# Patient Record
Sex: Male | Born: 1935 | Race: White | Hispanic: No | Smoking: Former smoker
Health system: Southern US, Community
[De-identification: ages and names within clinical notes are randomized; demographics above are authoritative.]

## PROBLEM LIST (undated history)

## (undated) DIAGNOSIS — I4891 Unspecified atrial fibrillation: Secondary | ICD-10-CM

## (undated) DIAGNOSIS — G7000 Myasthenia gravis without (acute) exacerbation: Secondary | ICD-10-CM

## (undated) DIAGNOSIS — I350 Nonrheumatic aortic (valve) stenosis: Secondary | ICD-10-CM

## (undated) DIAGNOSIS — G9340 Encephalopathy, unspecified: Secondary | ICD-10-CM

## (undated) DIAGNOSIS — F329 Major depressive disorder, single episode, unspecified: Secondary | ICD-10-CM

## (undated) DIAGNOSIS — I619 Nontraumatic intracerebral hemorrhage, unspecified: Secondary | ICD-10-CM

## (undated) DIAGNOSIS — R630 Anorexia: Secondary | ICD-10-CM

## (undated) DIAGNOSIS — E785 Hyperlipidemia, unspecified: Secondary | ICD-10-CM

## (undated) DIAGNOSIS — Z8673 Personal history of transient ischemic attack (TIA), and cerebral infarction without residual deficits: Secondary | ICD-10-CM

## (undated) DIAGNOSIS — M1990 Unspecified osteoarthritis, unspecified site: Secondary | ICD-10-CM

## (undated) DIAGNOSIS — N319 Neuromuscular dysfunction of bladder, unspecified: Secondary | ICD-10-CM

## (undated) DIAGNOSIS — A414 Sepsis due to anaerobes: Secondary | ICD-10-CM

## (undated) DIAGNOSIS — R7881 Bacteremia: Secondary | ICD-10-CM

## (undated) DIAGNOSIS — F0390 Unspecified dementia without behavioral disturbance: Secondary | ICD-10-CM

## (undated) DIAGNOSIS — R32 Unspecified urinary incontinence: Secondary | ICD-10-CM

## (undated) DIAGNOSIS — R296 Repeated falls: Secondary | ICD-10-CM

## (undated) DIAGNOSIS — R531 Weakness: Secondary | ICD-10-CM

## (undated) DIAGNOSIS — R270 Ataxia, unspecified: Secondary | ICD-10-CM

## (undated) DIAGNOSIS — R42 Dizziness and giddiness: Secondary | ICD-10-CM

## (undated) DIAGNOSIS — E7800 Pure hypercholesterolemia, unspecified: Secondary | ICD-10-CM

## (undated) DIAGNOSIS — I10 Essential (primary) hypertension: Secondary | ICD-10-CM

## (undated) DIAGNOSIS — N183 Chronic kidney disease, stage 3 (moderate): Principal | ICD-10-CM

## (undated) DIAGNOSIS — E559 Vitamin D deficiency, unspecified: Secondary | ICD-10-CM

## (undated) DIAGNOSIS — N189 Chronic kidney disease, unspecified: Secondary | ICD-10-CM

## (undated) DIAGNOSIS — I96 Gangrene, not elsewhere classified: Secondary | ICD-10-CM

## (undated) DIAGNOSIS — B3749 Other urogenital candidiasis: Secondary | ICD-10-CM

## (undated) HISTORY — DX: Other urogenital candidiasis: B37.49

## (undated) HISTORY — DX: Unspecified atrial fibrillation: I48.91

## (undated) HISTORY — PX: HERNIA REPAIR: SHX51

## (undated) HISTORY — DX: Major depressive disorder, single episode, unspecified: F32.9

## (undated) HISTORY — DX: Chronic kidney disease, unspecified: N18.9

## (undated) HISTORY — DX: Vitamin D deficiency, unspecified: E55.9

## (undated) HISTORY — DX: Sepsis due to anaerobes: A41.4

## (undated) HISTORY — DX: Anorexia: R63.0

## (undated) HISTORY — DX: Neuromuscular dysfunction of bladder, unspecified: N31.9

## (undated) HISTORY — PX: CERVICAL LAMINECTOMY: SHX94

## (undated) HISTORY — DX: Ataxia, unspecified: R27.0

## (undated) HISTORY — DX: Myasthenia gravis without (acute) exacerbation: G70.00

## (undated) HISTORY — DX: Nontraumatic intracerebral hemorrhage, unspecified: I61.9

## (undated) HISTORY — DX: Dizziness and giddiness: R42

## (undated) HISTORY — DX: Unspecified osteoarthritis, unspecified site: M19.90

## (undated) HISTORY — DX: Chronic kidney disease, stage 3 (moderate): N18.3

## (undated) HISTORY — DX: Gangrene, not elsewhere classified: I96

## (undated) HISTORY — DX: Encephalopathy, unspecified: G93.40

## (undated) HISTORY — DX: Repeated falls: R29.6

## (undated) HISTORY — DX: Weakness: R53.1

## (undated) HISTORY — DX: Bacteremia: R78.81

## (undated) HISTORY — DX: Pure hypercholesterolemia, unspecified: E78.00

---

## 1998-04-18 ENCOUNTER — Other Ambulatory Visit: Admission: RE | Admit: 1998-04-18 | Discharge: 1998-04-18 | Payer: Self-pay | Admitting: *Deleted

## 2002-07-18 ENCOUNTER — Encounter (INDEPENDENT_AMBULATORY_CARE_PROVIDER_SITE_OTHER): Payer: Self-pay | Admitting: Specialist

## 2002-07-18 ENCOUNTER — Ambulatory Visit (HOSPITAL_COMMUNITY): Admission: RE | Admit: 2002-07-18 | Discharge: 2002-07-18 | Payer: Self-pay | Admitting: Gastroenterology

## 2004-10-19 HISTORY — PX: BACK SURGERY: SHX140

## 2010-04-14 ENCOUNTER — Encounter: Admission: RE | Admit: 2010-04-14 | Discharge: 2010-04-14 | Payer: Self-pay | Admitting: Family Medicine

## 2010-04-30 ENCOUNTER — Inpatient Hospital Stay (HOSPITAL_COMMUNITY): Admission: RE | Admit: 2010-04-30 | Discharge: 2010-05-02 | Payer: Self-pay | Admitting: Neurological Surgery

## 2011-01-04 LAB — COMPREHENSIVE METABOLIC PANEL
AST: 29 U/L (ref 0–37)
Alkaline Phosphatase: 76 U/L (ref 39–117)
CO2: 27 mEq/L (ref 19–32)
Calcium: 9.8 mg/dL (ref 8.4–10.5)
Chloride: 104 mEq/L (ref 96–112)
Creatinine, Ser: 1.02 mg/dL (ref 0.4–1.5)
GFR calc Af Amer: >60 mL/min (ref >60)
Potassium: 4.7 mEq/L (ref 3.5–5.1)

## 2011-01-04 LAB — CBC
HCT: 44 % (ref 39.0–52.0)
MCH: 30.9 pg (ref 26.0–34.0)
MCV: 90.7 fL (ref 78.0–100.0)
Platelets: 274 10*3/uL (ref 150–400)
RDW: 13.4 % (ref 11.5–15.5)

## 2011-01-04 LAB — DIFFERENTIAL
Eosinophils Absolute: 0.3 10*3/uL (ref 0.0–0.7)
Eosinophils Relative: 5 % (ref 0–5)
Neutro Abs: 3.4 10*3/uL (ref 1.7–7.7)

## 2011-01-04 LAB — SURGICAL PCR SCREEN
MRSA, PCR: NEGATIVE
Staphylococcus aureus: POSITIVE — AB

## 2011-03-06 NOTE — Op Note (Signed)
NAMEROLEN, CONGER                         ACCOUNT NO.:  [id number]   MEDICAL RECORD NO.:  [id number]                   PATIENT TYPE:  AMB   LOCATION:  ENDO                                 FACILITY:  [facility]   PHYSICIAN:  Petra Kuba, M.D.                 DATE OF BIRTH:  02-Jun-1925   DATE OF PROCEDURE:  07/18/2002  DATE OF DISCHARGE:                                 OPERATIVE REPORT   PROCEDURE:  Colonoscopy with biopsy.   INDICATION:  Screening.  Consent was signed after risks, benefits, methods,  options thoroughly discussed in the office.   MEDICINES USED:  Demerol 50, Versed 4.   DESCRIPTION OF PROCEDURE:  Rectal inspection was pertinent for external  hemorrhoids.  Digital exam was negative.  The video colonoscope was  inserted, easily advanced around the colon to the cecum.  This did require  rolling him on his back and some abdominal pressure.  Other than some left  and right diverticula, no obvious abnormalities were seen as we advanced to  the cecum which was identified by the appendiceal orifice and the ileocecal  valve.  The scope was slowly withdrawn.  The prep was adequate.  There was  some liquid stool in some stool vaults that had to be washed and moved to  different placed into the colon and some with suction, but adequate  visualization was obtained.  On slow withdrawal through the colon, other  than the right and left-sided diverticula, two small linear transverse  ulcers were seen and were both cold biopsied.  No polypoid lesions, masses,  or other abnormalities but the diverticula was seen, as we slowly withdrew  back to the rectum.  Once back in the rectum, the scope was retroflexed,  pertinent for some internal hemorrhoids.  The scope was straightened and  readvanced a short ways up the left side of the colon; air was suctioned and  scope removed.  The patient tolerated the procedure well.  There was no  obvious immediate complication.   ENDOSCOPIC  DIAGNOSES:  1. Small internal-external hemorrhoids.  2. Left and right diverticula, moderate.  3. Two transverse small linear ulcers, status post biopsied.  4. Otherwise within normal limits to the cecum.    PLAN:  Await pathology, although these are probably aspirin and nonsteroidal  induced.  Be happy to see back sooner p.r.n.  Otherwise return care to Dr.  Idell Pickles for the customary health care maintenance to include yearly rectals  and guaiacs.  Consideration for repeat screening in 5-10 years if doing well  medically, and we will leave that to Dr. Idell Pickles.                                               Petra Kuba, M.D.  MEM/MEDQ  D:  07/18/2002  T:  07/18/2002  Job:  [id number]   cc:   Raynelle Dick, M.D.  [address]  [place]  [place] [address]  Fax: [phone]

## 2012-02-01 DIAGNOSIS — R609 Edema, unspecified: Secondary | ICD-10-CM | POA: Diagnosis not present

## 2012-02-08 DIAGNOSIS — M7989 Other specified soft tissue disorders: Secondary | ICD-10-CM | POA: Diagnosis not present

## 2012-02-09 ENCOUNTER — Other Ambulatory Visit: Payer: Self-pay | Admitting: Family Medicine

## 2012-02-09 DIAGNOSIS — M7989 Other specified soft tissue disorders: Secondary | ICD-10-CM

## 2012-02-10 ENCOUNTER — Ambulatory Visit
Admission: RE | Admit: 2012-02-10 | Discharge: 2012-02-10 | Disposition: A | Discharge status: Home / Self Care | Payer: Medicare Other | Source: Ambulatory Visit | Attending: Family Medicine | Admitting: Family Medicine

## 2012-02-10 DIAGNOSIS — M7989 Other specified soft tissue disorders: Secondary | ICD-10-CM

## 2012-02-10 DIAGNOSIS — M79609 Pain in unspecified limb: Secondary | ICD-10-CM | POA: Diagnosis not present

## 2012-02-11 DIAGNOSIS — M25569 Pain in unspecified knee: Secondary | ICD-10-CM | POA: Diagnosis not present

## 2012-05-26 DIAGNOSIS — E1139 Type 2 diabetes mellitus with other diabetic ophthalmic complication: Secondary | ICD-10-CM | POA: Diagnosis not present

## 2012-07-27 DIAGNOSIS — Z23 Encounter for immunization: Secondary | ICD-10-CM | POA: Diagnosis not present

## 2012-08-10 DIAGNOSIS — E7800 Pure hypercholesterolemia, unspecified: Secondary | ICD-10-CM | POA: Diagnosis not present

## 2012-08-10 DIAGNOSIS — I10 Essential (primary) hypertension: Secondary | ICD-10-CM | POA: Diagnosis not present

## 2012-08-17 DIAGNOSIS — E7800 Pure hypercholesterolemia, unspecified: Secondary | ICD-10-CM | POA: Diagnosis not present

## 2012-08-17 DIAGNOSIS — I10 Essential (primary) hypertension: Secondary | ICD-10-CM | POA: Diagnosis not present

## 2013-07-17 DIAGNOSIS — Z23 Encounter for immunization: Secondary | ICD-10-CM | POA: Diagnosis not present

## 2013-11-16 DIAGNOSIS — I10 Essential (primary) hypertension: Secondary | ICD-10-CM | POA: Diagnosis not present

## 2013-11-16 DIAGNOSIS — E7800 Pure hypercholesterolemia, unspecified: Secondary | ICD-10-CM | POA: Diagnosis not present

## 2013-11-16 DIAGNOSIS — Z23 Encounter for immunization: Secondary | ICD-10-CM | POA: Diagnosis not present

## 2014-07-26 DIAGNOSIS — Z23 Encounter for immunization: Secondary | ICD-10-CM | POA: Diagnosis not present

## 2014-11-15 DIAGNOSIS — I10 Essential (primary) hypertension: Secondary | ICD-10-CM | POA: Diagnosis not present

## 2014-11-15 DIAGNOSIS — E780 Pure hypercholesterolemia: Secondary | ICD-10-CM | POA: Diagnosis not present

## 2014-11-15 DIAGNOSIS — Z79899 Other long term (current) drug therapy: Secondary | ICD-10-CM | POA: Diagnosis not present

## 2014-11-16 DIAGNOSIS — H2513 Age-related nuclear cataract, bilateral: Secondary | ICD-10-CM | POA: Diagnosis not present

## 2015-01-16 ENCOUNTER — Inpatient Hospital Stay (HOSPITAL_COMMUNITY)
Admission: EM | Admit: 2015-01-16 | Discharge: 2015-01-21 | DRG: 871 | Disposition: A | Discharge status: Home Health | Payer: Medicare Other | Attending: Internal Medicine | Admitting: Internal Medicine

## 2015-01-16 ENCOUNTER — Emergency Department (HOSPITAL_COMMUNITY): Payer: Medicare Other

## 2015-01-16 ENCOUNTER — Encounter (HOSPITAL_COMMUNITY): Payer: Self-pay | Admitting: Emergency Medicine

## 2015-01-16 DIAGNOSIS — Y92019 Unspecified place in single-family (private) house as the place of occurrence of the external cause: Secondary | ICD-10-CM

## 2015-01-16 DIAGNOSIS — A4151 Sepsis due to Escherichia coli [E. coli]: Principal | ICD-10-CM | POA: Diagnosis present

## 2015-01-16 DIAGNOSIS — I509 Heart failure, unspecified: Secondary | ICD-10-CM | POA: Diagnosis not present

## 2015-01-16 DIAGNOSIS — J9811 Atelectasis: Secondary | ICD-10-CM | POA: Diagnosis present

## 2015-01-16 DIAGNOSIS — Z66 Do not resuscitate: Secondary | ICD-10-CM | POA: Diagnosis present

## 2015-01-16 DIAGNOSIS — Z87891 Personal history of nicotine dependence: Secondary | ICD-10-CM | POA: Diagnosis not present

## 2015-01-16 DIAGNOSIS — I10 Essential (primary) hypertension: Secondary | ICD-10-CM | POA: Diagnosis not present

## 2015-01-16 DIAGNOSIS — I350 Nonrheumatic aortic (valve) stenosis: Secondary | ICD-10-CM | POA: Diagnosis present

## 2015-01-16 DIAGNOSIS — E876 Hypokalemia: Secondary | ICD-10-CM | POA: Diagnosis present

## 2015-01-16 DIAGNOSIS — Z7982 Long term (current) use of aspirin: Secondary | ICD-10-CM | POA: Diagnosis not present

## 2015-01-16 DIAGNOSIS — R509 Fever, unspecified: Secondary | ICD-10-CM | POA: Diagnosis not present

## 2015-01-16 DIAGNOSIS — Z79899 Other long term (current) drug therapy: Secondary | ICD-10-CM

## 2015-01-16 DIAGNOSIS — D649 Anemia, unspecified: Secondary | ICD-10-CM | POA: Diagnosis present

## 2015-01-16 DIAGNOSIS — I517 Cardiomegaly: Secondary | ICD-10-CM | POA: Diagnosis present

## 2015-01-16 DIAGNOSIS — R579 Shock, unspecified: Secondary | ICD-10-CM | POA: Diagnosis present

## 2015-01-16 DIAGNOSIS — A419 Sepsis, unspecified organism: Secondary | ICD-10-CM | POA: Diagnosis present

## 2015-01-16 DIAGNOSIS — R739 Hyperglycemia, unspecified: Secondary | ICD-10-CM | POA: Diagnosis present

## 2015-01-16 DIAGNOSIS — E861 Hypovolemia: Secondary | ICD-10-CM | POA: Diagnosis present

## 2015-01-16 DIAGNOSIS — I4891 Unspecified atrial fibrillation: Secondary | ICD-10-CM | POA: Diagnosis not present

## 2015-01-16 DIAGNOSIS — M1990 Unspecified osteoarthritis, unspecified site: Secondary | ICD-10-CM | POA: Diagnosis present

## 2015-01-16 DIAGNOSIS — R531 Weakness: Secondary | ICD-10-CM | POA: Diagnosis not present

## 2015-01-16 DIAGNOSIS — R404 Transient alteration of awareness: Secondary | ICD-10-CM | POA: Diagnosis not present

## 2015-01-16 DIAGNOSIS — E785 Hyperlipidemia, unspecified: Secondary | ICD-10-CM | POA: Diagnosis present

## 2015-01-16 DIAGNOSIS — N179 Acute kidney failure, unspecified: Secondary | ICD-10-CM | POA: Insufficient documentation

## 2015-01-16 DIAGNOSIS — R6521 Severe sepsis with septic shock: Secondary | ICD-10-CM | POA: Diagnosis present

## 2015-01-16 HISTORY — DX: Nonrheumatic aortic (valve) stenosis: I35.0

## 2015-01-16 HISTORY — DX: Hyperlipidemia, unspecified: E78.5

## 2015-01-16 HISTORY — DX: Essential (primary) hypertension: I10

## 2015-01-16 LAB — CBC WITH DIFFERENTIAL/PLATELET
Basophils Absolute: 0 10*3/uL (ref 0.0–0.1)
Basophils Relative: 0 % (ref 0–1)
EOS PCT: 0 % (ref 0–5)
Eosinophils Absolute: 0 10*3/uL (ref 0.0–0.7)
HCT: 42.6 % (ref 39.0–52.0)
Hemoglobin: 14.4 g/dL (ref 13.0–17.0)
Lymphocytes Relative: 2 % — ABNORMAL LOW (ref 12–46)
Lymphs Abs: 0.4 10*3/uL — ABNORMAL LOW (ref 0.7–4.0)
MCH: 30.8 pg (ref 26.0–34.0)
MCHC: 33.8 g/dL (ref 30.0–36.0)
MCV: 91 fL (ref 78.0–100.0)
Monocytes Absolute: 1.6 10*3/uL — ABNORMAL HIGH (ref 0.1–1.0)
Monocytes Relative: 7 % (ref 3–12)
NEUTROS ABS: 20.4 10*3/uL — AB (ref 1.7–7.7)
Neutrophils Relative %: 91 % — ABNORMAL HIGH (ref 43–77)
Platelets: 213 10*3/uL (ref 150–400)
RBC: 4.68 MIL/uL (ref 4.22–5.81)
RDW: 13.7 % (ref 11.5–15.5)
WBC: 22.4 10*3/uL — AB (ref 4.0–10.5)

## 2015-01-16 LAB — CBG MONITORING, ED: GLUCOSE-CAPILLARY: 175 mg/dL — AB (ref 70–99)

## 2015-01-16 LAB — COMPREHENSIVE METABOLIC PANEL
ALBUMIN: 3.3 g/dL — AB (ref 3.5–5.2)
ALK PHOS: 102 U/L (ref 39–117)
ALT: 30 U/L (ref 0–53)
ANION GAP: 18 — AB (ref 5–15)
AST: 45 U/L — ABNORMAL HIGH (ref 0–37)
BUN: 46 mg/dL — AB (ref 6–23)
CO2: 17 mmol/L — ABNORMAL LOW (ref 19–32)
CREATININE: 2.39 mg/dL — AB (ref 0.50–1.35)
Calcium: 8.7 mg/dL (ref 8.4–10.5)
Chloride: 100 mmol/L (ref 96–112)
GFR calc Af Amer: 26 mL/min — ABNORMAL LOW (ref >90)
GFR, EST NON AFRICAN AMERICAN: 23 mL/min — AB (ref >90)
Glucose, Bld: 167 mg/dL — ABNORMAL HIGH (ref 70–99)
Potassium: 3.6 mmol/L (ref 3.5–5.1)
Sodium: 135 mmol/L (ref 135–145)
TOTAL PROTEIN: 6.7 g/dL (ref 6.0–8.3)
Total Bilirubin: 1.4 mg/dL — ABNORMAL HIGH (ref 0.3–1.2)

## 2015-01-16 LAB — I-STAT CG4 LACTIC ACID, ED
LACTIC ACID, VENOUS: 4.66 mmol/L — AB (ref 0.5–2.0)
Lactic Acid, Venous: 6.53 mmol/L (ref 0.5–2.0)

## 2015-01-16 LAB — URINALYSIS, ROUTINE W REFLEX MICROSCOPIC
Bilirubin Urine: NEGATIVE
Glucose, UA: NEGATIVE mg/dL
Hgb urine dipstick: NEGATIVE
KETONES UR: NEGATIVE mg/dL
Leukocytes, UA: NEGATIVE
NITRITE: NEGATIVE
PROTEIN: 30 mg/dL — AB
SPECIFIC GRAVITY, URINE: 1.023 (ref 1.005–1.030)
UROBILINOGEN UA: 1 mg/dL (ref 0.0–1.0)
pH: 5 (ref 5.0–8.0)

## 2015-01-16 LAB — URINE MICROSCOPIC-ADD ON

## 2015-01-16 MED ORDER — ACETAMINOPHEN 325 MG PO TABS
650.0000 mg | ORAL_TABLET | Freq: Once | ORAL | Status: AC
  Administered 2015-01-16: 650 mg via ORAL
  Filled 2015-01-16: qty 2

## 2015-01-16 MED ORDER — VANCOMYCIN HCL IN DEXTROSE 1-5 GM/200ML-% IV SOLN
1000.0000 mg | Freq: Once | INTRAVENOUS | Status: AC
  Administered 2015-01-16: 1000 mg via INTRAVENOUS
  Filled 2015-01-16: qty 200

## 2015-01-16 MED ORDER — LIDOCAINE HCL (PF) 1 % IJ SOLN
5.0000 mL | Freq: Once | INTRAMUSCULAR | Status: DC
  Filled 2015-01-16: qty 5

## 2015-01-16 MED ORDER — PIPERACILLIN-TAZOBACTAM 3.375 G IVPB
3.3750 g | Freq: Three times a day (TID) | INTRAVENOUS | Status: DC
  Administered 2015-01-17 – 2015-01-19 (×7): 3.375 g via INTRAVENOUS
  Filled 2015-01-16 (×8): qty 50

## 2015-01-16 MED ORDER — PIPERACILLIN-TAZOBACTAM 3.375 G IVPB 30 MIN
3.3750 g | Freq: Once | INTRAVENOUS | Status: AC
  Administered 2015-01-16: 3.375 g via INTRAVENOUS
  Filled 2015-01-16: qty 50

## 2015-01-16 MED ORDER — NOREPINEPHRINE BITARTRATE 1 MG/ML IV SOLN
0.0000 ug/min | INTRAVENOUS | Status: DC
  Administered 2015-01-16: 5 ug/min via INTRAVENOUS
  Administered 2015-01-17: 8 ug/min via INTRAVENOUS
  Administered 2015-01-17: 10 ug/min via INTRAVENOUS
  Administered 2015-01-18: 3 ug/min via INTRAVENOUS
  Filled 2015-01-16 (×4): qty 4

## 2015-01-16 MED ORDER — SODIUM CHLORIDE 0.9 % IV BOLUS (SEPSIS)
1000.0000 mL | INTRAVENOUS | Status: AC
  Administered 2015-01-16 (×3): 1000 mL via INTRAVENOUS

## 2015-01-16 MED ORDER — VANCOMYCIN HCL IN DEXTROSE 1-5 GM/200ML-% IV SOLN
1000.0000 mg | INTRAVENOUS | Status: DC
  Administered 2015-01-17: 1000 mg via INTRAVENOUS
  Filled 2015-01-16: qty 200

## 2015-01-16 MED ORDER — LIDOCAINE HCL 2 % EX GEL
CUTANEOUS | Status: AC
  Administered 2015-01-16: 23:00:00
  Filled 2015-01-16: qty 10

## 2015-01-16 MED ORDER — ACETAMINOPHEN 500 MG PO TABS: 1000.0000 mg | ORAL_TABLET | Freq: Once | ORAL | Status: DC

## 2015-01-16 NOTE — ED Notes (Signed)
Bed: WA08 Expected date:  Expected time:  Means of arrival:  Comments: EMS 81 yo male with increased weakness over the last several weeks

## 2015-01-16 NOTE — Progress Notes (Addendum)
  CARE MANAGEMENT ED NOTE 01/16/2015  Patient:  Seth [name],Seth [name]   Account Number:  [id number]  Date Initiated:  01/16/2015  Documentation initiated by:  Radford PaxFERRERO,Urban Naval  Subjective/Objective Assessment:   Patient presents to Ed with generalized weakness     Subjective/Objective Assessment Detail:   81 year old male with past medical history of hypertension, hyperlipidemia, and osteoarthritis.  B/P aslow as 78/56, temp101.3 rectal, WBC 22.4, lactic acid 6.53     Action/Plan:   Action/Plan Detail:   Anticipated DC Date:       Status Recommendation to Physician:   Result of Recommendation:    Other ED Services  Consult Working Plan    DC Planning Services  Other  PCP issues    Choice offered to / List presented to:            Status of service:  Completed, signed off  ED Comments:   ED Comments Detail:  EDCM spoke to patient and his grand children at bedside. Patient reports he lives at home alone.  Patient does not have any home health services at this time and has never had home health services.  Patient reports the only dme he has at home is a "couple of canes."  Patient reports "up until this point"  he was able to perform his own ADL's. Patient confirms his pcp is Dr. Duane LopeAlan Ross.  System updated. EDCM provided patient with a [place] health [place], explained services.  Patient reports he does not have the need for home health services or dme currently.  No further EDCM needs at this time.

## 2015-01-16 NOTE — ED Notes (Signed)
Notified EDP, Plunkett,MD., pt. i-stat Lactic acid results 6.53 and  RN, Isaias CowmanAllan.

## 2015-01-16 NOTE — ED Provider Notes (Signed)
CSN: [id number]     Arrival date & time 01/16/15  1937 History   First MD Initiated Contact with Patient 01/16/15 2008     Chief Complaint  Patient presents with  . Weakness  . Fever     (Consider location/radiation/quality/duration/timing/severity/associated sxs/prior Treatment) HPI  This is an 81 year old male with past medical history of hypertension, hyperlipidemia, and osteoarthritis. He is brought in by EMS for generalized weakness. History is given predominantly by his sons who stated that he seems to be "declining" over the past week. He has a decreased appetite, decreased fluid intake. He states that Sunday, was complaining of some abdominal pain. His son went to his house today and found him lying on the floor. He is not sure how long he was on the floor. The patient states that his knees gave out and he fell and was unable to get back up. Upon arrival, the patient was found to be hypotensive, tachycardic and febrile with a rectal temperature of 101.3. Sepsis orders were immediately initiated. The patient denies any cough, urinary symptoms, current abdominal pain, nausea, vomiting or diarrhea.  Past Medical History  Diagnosis Date  . Hypertension   . Hyperlipidemia    History reviewed. No pertinent past surgical history. History reviewed. No pertinent family history. History  Substance Use Topics  . Smoking status: Former Games developermoker  . Smokeless tobacco: Never Used  . Alcohol Use: Yes    Review of Systems  Ten systems reviewed and are negative for acute change, except as noted in the HPI.    Allergies  Review of patient's allergies indicates no known allergies.  Home Medications   Prior to Admission medications   Not on File   BP 81/43 mmHg  Pulse 109  Temp(Src) 101.3 F (38.5 C) (Rectal)  Resp 33  Ht 6' (1.829 m)  Wt 193 lb (87.544 kg)  BMI 26.17 kg/m2  SpO2 95% Physical Exam  Constitutional: He is oriented to person, place, and time. He appears  well-developed and well-nourished. No distress.  HENT:  Head: Normocephalic and atraumatic.  Eyes: Conjunctivae are normal. Left eye exhibits discharge. No scleral icterus.  Neck: Normal range of motion. Neck supple.  Cardiovascular: Normal rate, regular rhythm, normal heart sounds and intact distal pulses.   Pulmonary/Chest: Effort normal and breath sounds normal. No respiratory distress.  Abdominal: Soft. He exhibits no distension and no mass. There is no tenderness. There is no guarding.  Musculoskeletal: He exhibits no edema.  Neurological: He is alert and oriented to person, place, and time.  Skin: Skin is warm and dry. He is not diaphoretic.  Psychiatric: His behavior is normal.  Nursing note and vitals reviewed.   ED Course  Procedures (including critical care time) Labs Review Labs Reviewed  CBG MONITORING, ED - Abnormal; Notable for the following:    Glucose-Capillary 175 (*)    All other components within normal limits  I-STAT CG4 LACTIC ACID, ED - Abnormal; Notable for the following:    Lactic Acid, Venous 6.53 (*)    All other components within normal limits    Imaging Review No results found.   EKG Interpretation None     CRITICAL CARE Performed by: Arthor CaptainHarris, Kaivon Livesey   Total critical care time: 60   Critical care time was exclusive of separately billable procedures and treating other patients.  Critical care was necessary to treat or prevent imminent or life-threatening deterioration.  Critical care was time spent personally by me on the following activities: development of treatment  plan with patient and/or surrogate as well as nursing, discussions with consultants, evaluation of patient's response to treatment, examination of patient, obtaining history from patient or surrogate, ordering and performing treatments and interventions, ordering and review of laboratory studies, ordering and review of radiographic studies, pulse oximetry and re-evaluation of  patient's condition.   MDM   Final diagnoses:  Septic shock  Acute renal failure, unspecified acute renal failure type    8:38 PM Patient here, febrile, tachycardic, hypotensive. He did take his antihypertensive medications morning. Patient is receiving weight-based dosing. He is receiving vancomycin and Zosyn for unknown septic source. Lactate at 6.53. Initially, CBG of 175. Other labs are pending. Patient's respirations at 33.   9:43 PM BP 78/56 mmHg  Pulse 97  Temp(Src) 101.3 F (38.5 C) (Rectal)  Resp 18  Ht 6' (1.829 m)  Wt 193 lb (87.544 kg)  BMI 26.17 kg/m2  SpO2 96% Patient continues to be hypotensive despite 2-1/2 L bolus. Level I. Sepsis initiated. I spoke with the patient about more aggressive measures. He is 78, has a very high quality of life. He wishes for central line placement and pressors to be started.   10:38 PM Central line placed. Patient receiving pressors. I have informed Critical care.   11:47 PM Urine without apparent infection. Leukocytosis of 22 000.  Lactate improving. No signs of cellulitis. No signs of meningitis. Septic source is unknown. Ck iis pending Patient will be moved to ICU.  Levophed titrated to pressure up to 90.  Arthor Captain, PA-C 01/16/15 2350  Gwyneth Sprout, MD 01/17/15 1510  Gwyneth Sprout, MD 01/17/15 [phone]

## 2015-01-16 NOTE — ED Notes (Signed)
Notified RN,Kellee pt. i-stat Lactic acid CG4 results 4.66.

## 2015-01-16 NOTE — ED Notes (Signed)
Blood Culture x 2 has been collected and sent to lab at 2010.

## 2015-01-16 NOTE — Progress Notes (Signed)
ANTIBIOTIC CONSULT NOTE - INITIAL  Pharmacy Consult for Vancomycin, Zosyn Indication: rule out sepsis  No Known Allergies  Patient Measurements: Height: 6' (182.9 cm) Weight: 193 lb (87.544 kg) IBW/kg (Calculated) : 77.6  Vital Signs: Temp: 101.3 F (38.5 C) (03/30 2000) Temp Source: Rectal (03/30 2000) BP: 81/43 mmHg (03/30 1947) Pulse Rate: 109 (03/30 1947) Intake/Output from previous day:   Intake/Output from this shift:    Labs:  Recent Labs  01/16/15 1958  WBC 22.4*  HGB 14.4  PLT 213  CREATININE 2.39*   Estimated Creatinine Clearance: 23.9 mL/min (by C-G formula based on Cr of 2.39). No results for input(s): VANCOTROUGH, VANCOPEAK, VANCORANDOM, GENTTROUGH, GENTPEAK, GENTRANDOM, TOBRATROUGH, TOBRAPEAK, TOBRARND, AMIKACINPEAK, AMIKACINTROU, AMIKACIN in the last 72 hours.   Microbiology: No results found for this or any previous visit (from the past 720 hour(s)).  Medical History: Past Medical History  Diagnosis Date  . Hypertension   . Hyperlipidemia     Medications:  Anti-infectives    Start     Dose/Rate Route Frequency Ordered Stop   01/16/15 2030  piperacillin-tazobactam (ZOSYN) IVPB 3.375 g     3.375 g 100 mL/hr over 30 Minutes Intravenous  Once 01/16/15 2018     01/16/15 2030  vancomycin (VANCOCIN) IVPB 1000 mg/200 mL premix     1,000 mg 200 mL/hr over 60 Minutes Intravenous  Once 01/16/15 2018       Assessment: Seth [name] presented to ED on 3/30 after son found patient lying on the floor.  He reports generalized weakness (for years) and fall w/o injury today.  Pharmacy is consulted to dose vancomycin and Zosyn for suspected sepsis.   3/30 >> Vanc >> 3/30 >> Zosyn >>    Today, 01/16/2015:  Tmax: 101.3  WBCs: 22.4  Renal: SCr 2.39, CrCl ~ 24 ml/mn  Lactic acid: 6.53  Goal of Therapy:  Vancomycin trough level 15-20 mcg/ml  Plan:   Zosyn 3.375g IV Q8H infused over 4hrs.   Vancomycin 1g IV q24h.  Measure Vanc trough at steady  state.  Follow up renal fxn, culture results, and clinical course.   Lynann Beaverhristine Bentlie Catanzaro PharmD, BCPS Pager [phone] 01/16/2015 8:23 PM

## 2015-01-16 NOTE — ED Notes (Signed)
Brought in by EMS from home with c/o generalized weakness.  Pt reported that he has been having "weakness for years" but he has had a fall tonight without injury--- pt refused to come to ED but family insisted for evaluation.  Pt arrived to ED A/Ox4, in no s/s apparent distress.

## 2015-01-16 NOTE — H&P (Signed)
PULMONARY / CRITICAL CARE MEDICINE   Name: Seth [name] MRN: [id number] DOB: 1925/03/30    ADMISSION DATE:  01/16/2015 CONSULTATION DATE:  01/16/2015  REFERRING MD :  EDP Plunkett  CHIEF COMPLAINT:  Weakness/fall  INITIAL PRESENTATION:  81 year old male presented [facility] ED 3/30 with weakness x1 week. Had fall at home without injury. In ED he was found to be hypotensive with elevated lactic. Also febrile. CVL and pressors in ED. PCCM to admit.  STUDIES:    SIGNIFICANT EVENTS: 3/30 fall, found down by son, to ICU on pressors.   HISTORY OF PRESENT ILLNESS:  81 year old male with PMH as below, which is significant for hypertension. Recently complained of abdominal pain on 3/26 after big dinner, but no other complaints. Presented 3/30 when he was found down at home by son, however, he was conscious. Remembers falling, no dizziness, has bad knees and the "gave out". No LOC.   He was brought to [facility] ED where he was found to be hypotensive with elevated lactic. He was given volume and antibiotics. Lactic somewhat improved, but did not clear. BP still soft. CVL placed in ED and pressors initiated. PCCM to admit.  PAST MEDICAL HISTORY :   has a past medical history of Hypertension and Hyperlipidemia.  has no past surgical history on file. Prior to Admission medications   Medication Sig Start Date End Date Taking? Authorizing Provider  aspirin 81 MG tablet Take 81 mg by mouth at bedtime.   Yes Historical Provider, MD  CINNAMON PO Take 1 capsule by mouth daily.   Yes Historical Provider, MD  lisinopril (PRINIVIL,ZESTRIL) 20 MG tablet Take 20 mg by mouth daily.   Yes Historical Provider, MD  metoprolol succinate (TOPROL-XL) 50 MG 24 hr tablet Take 50 mg by mouth daily. Take with or immediately following a meal.   Yes Historical Provider, MD  Omega-3 Fatty Acids (FISH OIL PO) Take 1 capsule by mouth daily.   Yes Historical Provider, MD  simvastatin (ZOCOR) 10 MG tablet Take 10 mg by mouth at  bedtime.   Yes Historical Provider, MD   No Known Allergies  FAMILY HISTORY:  has no family status information on file.  SOCIAL HISTORY:  reports that he has quit smoking. He has never used smokeless tobacco. He reports that he drinks alcohol. He reports that he does not use illicit drugs.  REVIEW OF SYSTEMS:  Bolds are positive  Constitutional: weight loss, gain, night sweats, Fevers, chills, fatigue .  HEENT: headaches, Sore throat, sneezing, nasal congestion, post nasal drip, Difficulty swallowing, Tooth/dental problems, visual complaints visual changes, ear ache CV:  chest pain, radiates: ,Orthopnea, PND, swelling in lower extremities, dizziness, palpitations, syncope/orthostasis  GI  heartburn, indigestion, abdominal pain, nausea, vomiting, diarrhea, change in bowel habits, loss of appetite, bloody stools.  Resp: cough, productive: , hemoptysis, dyspnea, chest pain, pleuritic.  Skin: rash or itching or icterus GU: dysuria, change in color of urine, urgency or frequency. flank pain, hematuria  MS: joint pain or swelling. decreased range of motion  Psych: change in mood or affect. depression or anxiety.  Neuro: difficulty with speech, weakness, numbness, ataxia    SUBJECTIVE:   VITAL SIGNS: Temp:  [101.3 F (38.5 C)] 101.3 F (38.5 C) (03/30 2000) Pulse Rate:  [88-109] 93 (03/30 2323) Resp:  [12-33] 28 (03/30 2323) BP: (71-120)/(41-97) 88/59 mmHg (03/30 2323) SpO2:  [88 %-96 %] 94 % (03/30 2323) Weight:  [87.544 kg (193 lb)] 87.544 kg (193 lb) (03/30 1955)  HEMODYNAMICS:   VENTILATOR SETTINGS:   INTAKE / OUTPUT:  Intake/Output Summary (Last 24 hours) at 01/16/15 2331 Last data filed at 01/16/15 2305  Gross per 24 hour  Intake   3000 ml  Output     35 ml  Net   2965 ml    PHYSICAL EXAMINATION: General:  Obese male in NAD Neuro:  Alert, oriented x 4, non-focal HEENT:  [place]/AT, no JVD noted, PERRL Cardiovascular:  Irreg Irreg, normal rate Lungs:  Clear bilateral  breath sounds Abdomen:  Soft, non-tender, non-distended Musculoskeletal:  No acute deformity or edema Skin:  Grossly intact  LABS:  CBC  Recent Labs Lab 01/16/15 1958  WBC 22.4*  HGB 14.4  HCT 42.6  PLT 213   Coag's No results for input(s): APTT, INR in the last 168 hours. BMET  Recent Labs Lab 01/16/15 1958  NA 135  K 3.6  CL 100  CO2 17*  BUN 46*  CREATININE 2.39*  GLUCOSE 167*   Electrolytes  Recent Labs Lab 01/16/15 1958  CALCIUM 8.7   Sepsis Markers  Recent Labs Lab 01/16/15 2014 01/16/15 2148  LATICACIDVEN 6.53* 4.66*   ABG No results for input(s): PHART, PCO2ART, PO2ART in the last 168 hours. Liver Enzymes  Recent Labs Lab 01/16/15 1958  AST 45*  ALT 30  ALKPHOS 102  BILITOT 1.4*  ALBUMIN 3.3*   Cardiac Enzymes No results for input(s): TROPONINI, PROBNP in the last 168 hours. Glucose  Recent Labs Lab 01/16/15 1956  GLUCAP 175*    Imaging No results found.   ASSESSMENT / PLAN:  PULMONARY A: No acute issues  P:   O2 PRN to keep sats > 92% DNR/DNI  CARDIOVASCULAR CVL RIJ 3/30 >>> A:  Shock, etiology uncertain, suspect sepsis vs hypovolemic Atrial Fib (new onset) > CHA2DS-VASc = 3 H/o HTN  P:  MAP goal > 69m/Hg CVP monitoring, goal > 10 Levophed for MAP goal Ensure lactic clearing Ensure 30 cc/kg Check troponin Heparin gtt Consult cardiology in AM Holding outpatient antihypertensives  RENAL A:   AKI > suspect pre-renal  P:   Hydrate Follow bmet Correct lytes as indicated  GASTROINTESTINAL A:   ? Intraabdominal infection Poor PO intake  P:   NPO SUP: IV Protonix Consider CT abd/pelvis Amylase Lipase Alk phos  HEMATOLOGIC A:   No acute issues  P:  Heparin gtt for AF Follow CBC  INFECTIOUS A:   SIRS Septic shock, etiology unclear, consider intraabdominal?  P:   BCx2 3/30 > UC 3/30 > Flu 3/30 > Abx: pip/tazo, start date 3/30> Abx: vancomycin, start date 3/30> Follow WBC and  fever curve  ENDOCRINE A:   Hyperglycemia without history DM  P:   Follow glucose on Chem. Add SSI if consistently greater than 180  NEUROLOGIC A:   No acute issues  P:   RASS goal: 0 [place]  FAMILY  - Updates: updated, patient and family in ED 3/31  - Inter-disciplinary family meet or Palliative Care meeting due by:  4/6   PGeorgann Housekeeper AGACNP-BC LPutnam Gi LLCPulmonology/Critical Care Pager [phone] [phone] 01/16/2015 11:45 PM

## 2015-01-17 DIAGNOSIS — N179 Acute kidney failure, unspecified: Secondary | ICD-10-CM

## 2015-01-17 DIAGNOSIS — R6521 Severe sepsis with septic shock: Secondary | ICD-10-CM

## 2015-01-17 DIAGNOSIS — I509 Heart failure, unspecified: Secondary | ICD-10-CM

## 2015-01-17 DIAGNOSIS — R739 Hyperglycemia, unspecified: Secondary | ICD-10-CM | POA: Insufficient documentation

## 2015-01-17 DIAGNOSIS — A419 Sepsis, unspecified organism: Secondary | ICD-10-CM

## 2015-01-17 DIAGNOSIS — R579 Shock, unspecified: Secondary | ICD-10-CM | POA: Diagnosis present

## 2015-01-17 DIAGNOSIS — I4891 Unspecified atrial fibrillation: Secondary | ICD-10-CM | POA: Insufficient documentation

## 2015-01-17 LAB — ALKALINE PHOSPHATASE: ALK PHOS: 80 U/L (ref 39–117)

## 2015-01-17 LAB — BASIC METABOLIC PANEL
ANION GAP: 9 (ref 5–15)
Anion gap: 6 (ref 5–15)
BUN: 47 mg/dL — ABNORMAL HIGH (ref 6–23)
BUN: 42 mg/dL — AB (ref 6–23)
CALCIUM: 7.6 mg/dL — AB (ref 8.4–10.5)
CO2: 21 mmol/L (ref 19–32)
CO2: 23 mmol/L (ref 19–32)
CREATININE: 2.05 mg/dL — AB (ref 0.50–1.35)
CREATININE: 1.59 mg/dL — AB (ref 0.50–1.35)
Calcium: 8 mg/dL — ABNORMAL LOW (ref 8.4–10.5)
Chloride: 105 mmol/L (ref 96–112)
Chloride: 108 mmol/L (ref 96–112)
GFR calc Af Amer: 43 mL/min — ABNORMAL LOW (ref >90)
GFR calc non Af Amer: 27 mL/min — ABNORMAL LOW (ref >90)
GFR, EST AFRICAN AMERICAN: 32 mL/min — AB (ref >90)
GFR, EST NON AFRICAN AMERICAN: 37 mL/min — AB (ref >90)
GLUCOSE: 111 mg/dL — AB (ref 70–99)
Glucose, Bld: 189 mg/dL — ABNORMAL HIGH (ref 70–99)
POTASSIUM: 3.9 mmol/L (ref 3.5–5.1)
Potassium: 3.9 mmol/L (ref 3.5–5.1)
SODIUM: 135 mmol/L (ref 135–145)
Sodium: 137 mmol/L (ref 135–145)

## 2015-01-17 LAB — CARBOXYHEMOGLOBIN
CARBOXYHEMOGLOBIN: 1.4 % (ref 0.5–1.5)
METHEMOGLOBIN: 0.8 % (ref 0.0–1.5)
O2 SAT: 70.7 %
TOTAL HEMOGLOBIN: 12.4 g/dL — AB (ref 13.5–18.0)

## 2015-01-17 LAB — CBC WITH DIFFERENTIAL/PLATELET
Basophils Absolute: 0 10*3/uL (ref 0.0–0.1)
Basophils Relative: 0 % (ref 0–1)
EOS PCT: 1 % (ref 0–5)
Eosinophils Absolute: 0.1 10*3/uL (ref 0.0–0.7)
HEMATOCRIT: 35.9 % — AB (ref 39.0–52.0)
Hemoglobin: 12.1 g/dL — ABNORMAL LOW (ref 13.0–17.0)
LYMPHS ABS: 1.2 10*3/uL (ref 0.7–4.0)
Lymphocytes Relative: 6 % — ABNORMAL LOW (ref 12–46)
MCH: 30.3 pg (ref 26.0–34.0)
MCHC: 33.7 g/dL (ref 30.0–36.0)
MCV: 89.8 fL (ref 78.0–100.0)
MONOS PCT: 11 % (ref 3–12)
Monocytes Absolute: 2 10*3/uL — ABNORMAL HIGH (ref 0.1–1.0)
Neutro Abs: 15.9 10*3/uL — ABNORMAL HIGH (ref 1.7–7.7)
Neutrophils Relative %: 82 % — ABNORMAL HIGH (ref 43–77)
Platelets: 201 10*3/uL (ref 150–400)
RBC: 4 MIL/uL — ABNORMAL LOW (ref 4.22–5.81)
RDW: 13.9 % (ref 11.5–15.5)
WBC: 19.3 10*3/uL — ABNORMAL HIGH (ref 4.0–10.5)

## 2015-01-17 LAB — CBC
HCT: 37.3 % — ABNORMAL LOW (ref 39.0–52.0)
Hemoglobin: 12.6 g/dL — ABNORMAL LOW (ref 13.0–17.0)
MCH: 30.4 pg (ref 26.0–34.0)
MCHC: 33.8 g/dL (ref 30.0–36.0)
MCV: 89.9 fL (ref 78.0–100.0)
PLATELETS: 203 10*3/uL (ref 150–400)
RBC: 4.15 MIL/uL — ABNORMAL LOW (ref 4.22–5.81)
RDW: 13.7 % (ref 11.5–15.5)
WBC: 26.8 10*3/uL — ABNORMAL HIGH (ref 4.0–10.5)

## 2015-01-17 LAB — INFLUENZA PANEL BY PCR (TYPE A & B)
H1N1 flu by pcr: NOT DETECTED
INFLAPCR: NEGATIVE
Influenza B By PCR: NEGATIVE

## 2015-01-17 LAB — LIPASE, BLOOD: Lipase: 16 U/L (ref 11–59)

## 2015-01-17 LAB — I-STAT CG4 LACTIC ACID, ED: LACTIC ACID, VENOUS: 2.34 mmol/L — AB (ref 0.5–2.0)

## 2015-01-17 LAB — PROCALCITONIN: Procalcitonin: 49.58 ng/mL

## 2015-01-17 LAB — MRSA PCR SCREENING: MRSA BY PCR: NEGATIVE

## 2015-01-17 LAB — LACTIC ACID, PLASMA
Lactic Acid, Venous: 2.2 mmol/L (ref 0.5–2.0)
Lactic Acid, Venous: 1.7 mmol/L (ref 0.5–2.0)

## 2015-01-17 LAB — CK TOTAL AND CKMB (NOT AT ARMC)
CK, MB: 14 ng/mL (ref 0.3–4.0)
Relative Index: INVALID (ref 0.0–2.5)
Total CK: 53 U/L (ref 7–232)

## 2015-01-17 LAB — PHOSPHORUS: Phosphorus: 2.7 mg/dL (ref 2.3–4.6)

## 2015-01-17 LAB — TROPONIN I
Troponin I: 0.03 ng/mL (ref <0.031)
Troponin I: 0.03 ng/mL (ref <0.031)

## 2015-01-17 LAB — HEPARIN LEVEL (UNFRACTIONATED)
HEPARIN UNFRACTIONATED: 0.4 [IU]/mL (ref 0.30–0.70)
Heparin Unfractionated: 0.43 IU/mL (ref 0.30–0.70)

## 2015-01-17 LAB — CORTISOL: Cortisol, Plasma: 54.4 ug/dL

## 2015-01-17 LAB — PROTIME-INR
INR: 1.26 (ref 0.00–1.49)
PROTHROMBIN TIME: 16 s — AB (ref 11.6–15.2)

## 2015-01-17 LAB — MAGNESIUM: Magnesium: 1.8 mg/dL (ref 1.5–2.5)

## 2015-01-17 LAB — BRAIN NATRIURETIC PEPTIDE: B Natriuretic Peptide: 577.8 pg/mL — ABNORMAL HIGH (ref 0.0–100.0)

## 2015-01-17 LAB — AMYLASE: Amylase: 26 U/L (ref 0–105)

## 2015-01-17 LAB — STREP PNEUMONIAE URINARY ANTIGEN: STREP PNEUMO URINARY ANTIGEN: NEGATIVE

## 2015-01-17 MED ORDER — CETYLPYRIDINIUM CHLORIDE 0.05 % MT LIQD
7.0000 mL | Freq: Two times a day (BID) | OROMUCOSAL | Status: DC
  Administered 2015-01-17 – 2015-01-21 (×7): 7 mL via OROMUCOSAL

## 2015-01-17 MED ORDER — HEPARIN BOLUS VIA INFUSION
2000.0000 [IU] | Freq: Once | INTRAVENOUS | Status: AC
  Administered 2015-01-17: 2000 [IU] via INTRAVENOUS
  Filled 2015-01-17: qty 2000

## 2015-01-17 MED ORDER — PANTOPRAZOLE SODIUM 40 MG IV SOLR
40.0000 mg | Freq: Every day | INTRAVENOUS | Status: DC
  Administered 2015-01-17 (×2): 40 mg via INTRAVENOUS
  Filled 2015-01-17 (×2): qty 40

## 2015-01-17 MED ORDER — SODIUM CHLORIDE 0.9 % IV SOLN
INTRAVENOUS | Status: DC
  Administered 2015-01-17 – 2015-01-19 (×3): via INTRAVENOUS

## 2015-01-17 MED ORDER — MAGNESIUM SULFATE IN D5W 10-5 MG/ML-% IV SOLN
1.0000 g | Freq: Once | INTRAVENOUS | Status: AC
  Administered 2015-01-17: 1 g via INTRAVENOUS
  Filled 2015-01-17: qty 100

## 2015-01-17 MED ORDER — HEPARIN (PORCINE) IN NACL 100-0.45 UNIT/ML-% IJ SOLN
1300.0000 [IU]/h | INTRAMUSCULAR | Status: DC
  Administered 2015-01-17 – 2015-01-19 (×4): 1300 [IU]/h via INTRAVENOUS
  Filled 2015-01-17 (×6): qty 250

## 2015-01-17 NOTE — Progress Notes (Signed)
Nutrition Brief Note  Patient identified on the Malnutrition Screening Tool (MST) Report  Wt Readings from Last 15 Encounters:  01/17/15 190 lb 0.6 oz (86.2 kg)    Body mass index is 26.52 kg/(m^2). Patient meets criteria for overweight based on current BMI.   Current diet order is Heart Healthy, patient is consuming approximately 100% of meals at this time.  Pt eating at the time of visit. States that he is hungry. Per pt, he usually has good appetite and it declined when he got sick. Pt reports his usual body weight is about 190 Lb (same as current), and he doesn't feel like he lost weight recently.  Labs and medications reviewed.  Glu 189, BUN 47  No nutrition interventions warranted at this time. If nutrition issues arise, please consult RD.   Timothey Dahlstrom A. Wael Maestas Dietetic Intern Pager: [phone] 01/17/2015 12:58 PM

## 2015-01-17 NOTE — Progress Notes (Signed)
ANTICOAGULATION CONSULT NOTE - Initial Consult  Pharmacy Consult for Heparin Indication: atrial fibrillation  No Known Allergies  Patient Measurements: Height: 5\' 11"  (180.3 cm) Weight: 190 lb 0.6 oz (86.2 kg) IBW/kg (Calculated) : 75.3 Heparin Dosing Weight:   Vital Signs: Temp: 97.9 F (36.6 C) (03/31 0400) Temp Source: Core (Comment) (03/31 0400) BP: 99/68 mmHg (03/31 0300) Pulse Rate: 76 (03/31 0300)  Labs:  Recent Labs  01/16/15 1958 01/17/15 0149  HGB 14.4 12.6*  HCT 42.6 37.3*  PLT 213 203  LABPROT  --  16.0*  INR  --  1.26  CREATININE 2.39* 2.05*  CKTOTAL 53  --   CKMB 14.0*  --   TROPONINI  --  0.03    Estimated Creatinine Clearance: 27 mL/min (by C-G formula based on Cr of 2.05).   Medical History: Past Medical History  Diagnosis Date  . Hypertension   . Hyperlipidemia     Medications:  Infusions:  . sodium chloride 75 mL/hr at 01/17/15 0500  . heparin 1,300 Units/hr (01/17/15 0500)  . norepinephrine (LEVOPHED) Adult infusion 8 mcg/min (01/17/15 0500)    Assessment: Patient with new onset afib.  No oral anticoagulants noted on med rec.   Goal of Therapy:  Heparin level 0.3-0.7 units/ml Monitor platelets by anticoagulation protocol: Yes   Plan:  Heparin bolus  2000 units iv x1 Heparin drip at 1300 units/hr Daily  CBC Next heparin level at  [address]    Seth [name], Seth [name] 01/17/2015,6:19 AM

## 2015-01-17 NOTE — Progress Notes (Signed)
CARE MANAGEMENT NOTE 01/17/2015  Patient:  Gwenith DailySMOAK,Yuuki H   Account Number:  [id number]  Date Initiated:  01/17/2015  Documentation initiated by:  Emiline Mancebo  Subjective/Objective Assessment:   sepsis with hypotensive state     Action/Plan:   from home   Anticipated DC Date:  01/20/2015   Anticipated DC Plan:  HOME/SELF CARE  In-house referral  NA      DC Planning Services  CM consult      PAC Choice  NA   Choice offered to / List presented to:  NA           Status of service:  In process, will continue to follow Medicare Important Message given?   (If response is "NO", the following Medicare IM given date fields will be blank) Date Medicare IM given:   Medicare IM given by:   Date Additional Medicare IM given:   Additional Medicare IM given by:    Discharge Disposition:    Per UR Regulation:  Reviewed for med. necessity/level of care/duration of stay  If discussed at Long Length of Stay Meetings, dates discussed:    Comments:  January 17, 2015/Laynee Lockamy L. Earlene Plateravis, RN, BSN, CCM. Case Management [employer] [phone] No discharge needs present of time of review.

## 2015-01-17 NOTE — Progress Notes (Signed)
Pt status, CVP and lab values called to elink - Dr. Arsenio LoaderSommer.  Orders received.

## 2015-01-17 NOTE — Progress Notes (Signed)
ANTICOAGULATION CONSULT NOTE - Follow Up  Pharmacy Consult for Heparin Indication: atrial fibrillation  No Known Allergies  Patient Measurements: Height: 5\' 11"  (180.3 cm) Weight: 190 lb 0.6 oz (86.2 kg) IBW/kg (Calculated) : 75.3 Heparin Dosing Weight:   Vital Signs: Temp: 99.1 F (37.3 C) (03/31 1300) Temp Source: Core (Comment) (03/31 0400) BP: 99/50 mmHg (03/31 1300) Pulse Rate: 61 (03/31 1300)  Labs:  Recent Labs  01/16/15 1958 01/17/15 0149 01/17/15 0815 01/17/15 1125  HGB 14.4 12.6*  --   --   HCT 42.6 37.3*  --   --   PLT 213 203  --   --   LABPROT  --  16.0*  --   --   INR  --  1.26  --   --   HEPARINUNFRC  --   --   --  0.43  CREATININE 2.39* 2.05*  --   --   CKTOTAL 53  --   --   --   CKMB 14.0*  --   --   --   TROPONINI  --  0.03 0.03  --     Estimated Creatinine Clearance: 27 mL/min (by C-G formula based on Cr of 2.05).   Medical History: Past Medical History  Diagnosis Date  . Hypertension   . Hyperlipidemia     Medications:  Infusions:  . sodium chloride 75 mL/hr at 01/17/15 0500  . heparin 1,300 Units/hr (01/17/15 0500)  . norepinephrine (LEVOPHED) Adult infusion 5 mcg/min (01/17/15 1317)    Assessment: [age] yoM admitted with weakness, fall, shock, with new onset afib. Pharmacy consulted to start heparin infusion.  Started heparin 3/31 AM.  Cardiology consulted.  Today, 01/17/2015:  First heparin level therapeutic following bolus and infusion at 1300 units/hr  CBC ok No bleeding/complications reported  Goal of Therapy:  Heparin level 0.3-0.7 units/ml Monitor platelets by anticoagulation protocol: Yes   Plan:  1.  Continue heparin infusion at 1300 units/hr. 2.  Repeat heparin level in 8 hours. 3.  Daily CBC and HL while on heparin infusion.    Clance Bollunyon, Jelisa  01/17/2015,1:28 PM

## 2015-01-17 NOTE — Progress Notes (Signed)
Echocardiogram 2D Echocardiogram has been performed.  Dorothey BasemanReel, Zacharee Gaddie M 01/17/2015, 3:03 PM

## 2015-01-17 NOTE — Progress Notes (Signed)
Pharmacy Consult for Heparin Indication: atrial fibrillation  See previous note from Clance BollAmanda Runyon, PharmD for full details.  Heparin level = 0.4 on 1300 units/hr No bleeding reported per RN  Plan:  Continue current heparin rate  Daily heparin level and CBC  Loralee PacasErin Anatole Apollo, PharmD, BCPS Pager: [phone]  01/17/2015 8:02 PM

## 2015-01-17 NOTE — Progress Notes (Signed)
CRITICAL VALUE ALERT  Critical value received:  + Blood Cultures (Anaerobic Bottle growing gram negative rods x2)  Date of notification:  01/17/2015  Time of notification:  1350   Critical value read back:Yes.    Nurse who received alert:  Lezlie LyeLisa Deshon Koslowski, RN  MD notified (1st page):  Pearson ForsterMarsha ELINK  Time of first page:  1740  MD notified (2nd page):  Time of second page:  Responding MD:  Pearson ForsterMarsha ELINK  Time MD responded:  [phone]

## 2015-01-17 NOTE — ED Notes (Signed)
Attempted to call report. No answer.

## 2015-01-17 NOTE — Progress Notes (Signed)
eLink Physician-Brief Progress Note Patient Name: Seth [name] H Haning DOB: October 30, 1924 MRN: [id number]   Date of Service  01/17/2015  HPI/Events of Note  Remains hypotensive and requests Norepinephrine for hemodynamic support. CXR with cardiomegaly and mild pulmonary congestion.   eICU Interventions  Will order 2D cardiac echo.     Intervention Category Minor Interventions: Clinical assessment - ordering diagnostic tests;Communication with other healthcare providers and/or family  Lenell AntuSommer,Steven Eugene 01/17/2015, 3:22 AM

## 2015-01-18 ENCOUNTER — Encounter (HOSPITAL_COMMUNITY): Payer: Self-pay | Admitting: Pulmonary Disease

## 2015-01-18 LAB — BASIC METABOLIC PANEL
ANION GAP: 7 (ref 5–15)
BUN: 36 mg/dL — ABNORMAL HIGH (ref 6–23)
CHLORIDE: 108 mmol/L (ref 96–112)
CO2: 21 mmol/L (ref 19–32)
CREATININE: 1.4 mg/dL — AB (ref 0.50–1.35)
Calcium: 7.8 mg/dL — ABNORMAL LOW (ref 8.4–10.5)
GFR calc Af Amer: 51 mL/min — ABNORMAL LOW (ref >90)
GFR calc non Af Amer: 44 mL/min — ABNORMAL LOW (ref >90)
GLUCOSE: 132 mg/dL — AB (ref 70–99)
Potassium: 3.6 mmol/L (ref 3.5–5.1)
Sodium: 136 mmol/L (ref 135–145)

## 2015-01-18 LAB — CBC WITH DIFFERENTIAL/PLATELET
BASOS ABS: 0 10*3/uL (ref 0.0–0.1)
Basophils Relative: 0 % (ref 0–1)
Eosinophils Absolute: 0.2 10*3/uL (ref 0.0–0.7)
Eosinophils Relative: 1 % (ref 0–5)
HCT: 34.8 % — ABNORMAL LOW (ref 39.0–52.0)
Hemoglobin: 11.9 g/dL — ABNORMAL LOW (ref 13.0–17.0)
LYMPHS PCT: 7 % — AB (ref 12–46)
Lymphs Abs: 1 10*3/uL (ref 0.7–4.0)
MCH: 30.7 pg (ref 26.0–34.0)
MCHC: 34.2 g/dL (ref 30.0–36.0)
MCV: 89.7 fL (ref 78.0–100.0)
MONO ABS: 2.2 10*3/uL — AB (ref 0.1–1.0)
Monocytes Relative: 15 % — ABNORMAL HIGH (ref 3–12)
NEUTROS ABS: 11.6 10*3/uL — AB (ref 1.7–7.7)
NEUTROS PCT: 77 % (ref 43–77)
Platelets: 195 10*3/uL (ref 150–400)
RBC: 3.88 MIL/uL — ABNORMAL LOW (ref 4.22–5.81)
RDW: 14.1 % (ref 11.5–15.5)
WBC: 15 10*3/uL — AB (ref 4.0–10.5)

## 2015-01-18 LAB — HEPARIN LEVEL (UNFRACTIONATED): Heparin Unfractionated: 0.37 IU/mL (ref 0.30–0.70)

## 2015-01-18 LAB — LEGIONELLA ANTIGEN, URINE

## 2015-01-18 LAB — URINE CULTURE
Colony Count: NO GROWTH
Culture: NO GROWTH

## 2015-01-18 LAB — PHOSPHORUS: Phosphorus: 2.2 mg/dL — ABNORMAL LOW (ref 2.3–4.6)

## 2015-01-18 LAB — PROCALCITONIN: Procalcitonin: 29.45 ng/mL

## 2015-01-18 LAB — TROPONIN I: Troponin I: <0.03 ng/mL (ref <0.031)

## 2015-01-18 LAB — MAGNESIUM: Magnesium: 1.9 mg/dL (ref 1.5–2.5)

## 2015-01-18 MED ORDER — POTASSIUM CHLORIDE 10 MEQ/50ML IV SOLN
10.0000 meq | INTRAVENOUS | Status: AC
  Administered 2015-01-18 (×2): 10 meq via INTRAVENOUS
  Filled 2015-01-18 (×2): qty 50

## 2015-01-18 MED ORDER — SODIUM PHOSPHATE 3 MMOLE/ML IV SOLN
10.0000 mmol | Freq: Once | INTRAVENOUS | Status: AC
  Administered 2015-01-18: 10 mmol via INTRAVENOUS
  Filled 2015-01-18: qty 3.33

## 2015-01-18 NOTE — Progress Notes (Signed)
ANTICOAGULATION CONSULT NOTE - Follow Up  Pharmacy Consult for Heparin Indication: atrial fibrillation  No Known Allergies  Patient Measurements: Height: 5\' 11"  (180.3 cm) Weight: 198 lb 13.7 oz (90.2 kg) IBW/kg (Calculated) : 75.3 Heparin Dosing Weight: actual weight  Vital Signs: Temp: 99.3 F (37.4 C) (04/01 0400) Temp Source: Core (Comment) (04/01 0400) BP: 112/68 mmHg (04/01 0645) Pulse Rate: 87 (04/01 0645)  Labs:  Recent Labs  01/16/15 1958 01/17/15 0149 01/17/15 0815 01/17/15 1125 01/17/15 1410 01/17/15 1835 01/18/15 0441  HGB 14.4 12.6*  --   --  12.1*  --  11.9*  HCT 42.6 37.3*  --   --  35.9*  --  34.8*  PLT 213 203  --   --  201  --  195  LABPROT  --  16.0*  --   --   --   --   --   INR  --  1.26  --   --   --   --   --   HEPARINUNFRC  --   --   --  0.43  --  0.40 0.37  CREATININE 2.39* 2.05*  --   --  1.59*  --  1.40*  CKTOTAL 53  --   --   --   --   --   --   CKMB 14.0*  --   --   --   --   --   --   TROPONINI  --  0.03 0.03  --   --   --  <0.03    Estimated Creatinine Clearance: 39.6 mL/min (by C-G formula based on Cr of 1.4).   Medications:  Infusions:  . sodium chloride 75 mL/hr at 01/18/15 0417  . heparin 1,300 Units/hr (01/17/15 2005)  . norepinephrine (LEVOPHED) Adult infusion Stopped (01/18/15 0710)    Assessment: [age] yoM admitted 3/30 with weakness, fall, shock, with new onset afib. Pharmacy consulted to start heparin infusion 3/31 AM.  Cardiology consulted.  Today, 01/18/2015:  Heparin level 0.37, remains therapeutic on heparin at 1300 units/hr  CBC: Hgb remains stable, 11.9 and Plt WNL. No bleeding/complications reported  Goal of Therapy:  Heparin level 0.3-0.7 units/ml Monitor platelets by anticoagulation protocol: Yes   Plan:   Continue heparin infusion at 1300 units/hr.  Daily CBC and HL while on heparin infusion.  Follow up long-term anticoagulation plans.  Lynann Beaverhristine Meldon Hanzlik PharmD, BCPS Pager [phone] 01/18/2015 7:07  AM

## 2015-01-18 NOTE — Progress Notes (Signed)
[facility] FOR AM LAB REPLACEMENT ONLY  The patient does apply for the [facility] Electrolyte Re[facility] Protocol based on the criteria listed below:   1. Is GFR >/= 40 ml/min? Yes.    Patient's GFR today is 44 2. Is urine output >/= 0.5 ml/kg/hr for the last 6 hours? Yes.   Patient's UOP is 0.9 ml/kg/hr 3. Is BUN < 60 mg/dL? Yes.    Patient's BUN today is 21 4. Abnormal electrolyte(s):K3.6,mg1.9 5. Ordered repletion with: protocol  6. If a panic level lab has been reported, has the CCM MD in charge been notified? Yes.  .   Physician:  S Sommer,MD  Melrose NakayamaChisholm, Jaidy Cottam William 01/18/2015 6:16 AM

## 2015-01-18 NOTE — Progress Notes (Signed)
PULMONARY / CRITICAL CARE MEDICINE   Name: Seth [name] MRN: [id number] DOB: 16-Jun-1925    ADMISSION DATE:  01/16/2015 CONSULTATION DATE:  01/16/2015  REFERRING MD :  EDP Plunkett  CHIEF COMPLAINT:  Weakness/fall  INITIAL PRESENTATION:  81 year old male presented to [facility] ED 3/30 with weakness x1 week. Had fall at home without injury. In ED he was found to be hypotensive, febrile with elevated lactic.  CVL and pressors in ED. PCCM to admit.  STUDIES:  3/31  ECHO >> nml LV, systolic fxn normal 50-38%, mild AS, mild AR, mild dilation of LA  SIGNIFICANT EVENTS: 3/30  Fall, found down by son, to ICU on pressors.  4/01  Off pressors, lactic acid cleared, GNR in BC.  Remains in AF     SUBJECTIVE:  RN reports pt weaned off levo this am, remains on heparin gtt and in AF.    VITAL SIGNS: Temp:  [98.2 F (36.8 C)-100.4 F (38 C)] 98.6 F (37 C) (04/01 0800) Pulse Rate:  [25-100] 89 (04/01 0700) Resp:  [16-32] 22 (04/01 0700) BP: (84-136)/(45-113) 101/59 mmHg (04/01 0700) SpO2:  [93 %-100 %] 93 % (04/01 0700) Weight:  [198 lb 13.7 oz (90.2 kg)] 198 lb 13.7 oz (90.2 kg) (04/01 0440)   HEMODYNAMICS: CVP:  [18 mmHg] 18 mmHg   INTAKE / OUTPUT:  Intake/Output Summary (Last 24 hours) at 01/18/15 0933 Last data filed at 01/18/15 0600  Gross per 24 hour  Intake 3141.27 ml  Output   1320 ml  Net 1821.27 ml    PHYSICAL EXAMINATION: General:  Obese elderly male in NAD Neuro:  Alert, oriented x 4, non-focal HEENT:  [place]/AT, no JVD noted, PERRL Cardiovascular:  Irreg Irreg, normal rate Lungs:  Clear bilateral breath sounds Abdomen:  Soft, non-tender, non-distended Musculoskeletal:  No acute deformity or edema Skin:  Grossly intact  LABS:  CBC  Recent Labs Lab 01/17/15 0149 01/17/15 1410 01/18/15 0441  WBC 26.8* 19.3* 15.0*  HGB 12.6* 12.1* 11.9*  HCT 37.3* 35.9* 34.8*  PLT 203 201 195   Coag's  Recent Labs Lab 01/17/15 0149  INR 1.26   BMET  Recent Labs Lab  01/17/15 0149 01/17/15 1410 01/18/15 0441  NA 135 137 136  K 3.9 3.9 3.6  CL 105 108 108  CO2 _0 BUN 47* 42* 36*  CREATININE 2.05* 1.59* 1.40*  GLUCOSE 189* 111* 132*   Electrolytes  Recent Labs Lab 01/17/15 0149 01/17/15 1410 01/18/15 0441  CALCIUM 8.0* 7.6* 7.8*  MG 1.8  --  1.9  PHOS 2.7  --  2.2*   Sepsis Markers  Recent Labs Lab 01/17/15 0104 01/17/15 0149 01/17/15 0815 01/17/15 1410 01/18/15 0441  LATICACIDVEN 2.34* 2.2*  --  1.7  --   PROCALCITON  --   --  49.58  --  29.45   Liver Enzymes  Recent Labs Lab 01/16/15 1958 01/17/15 0149  AST 45*  --   ALT 30  --   ALKPHOS 102 80  BILITOT 1.4*  --   ALBUMIN 3.3*  --    Cardiac Enzymes  Recent Labs Lab 01/17/15 0149 01/17/15 0815 01/18/15 0441  TROPONINI 0.03 0.03 <0.03   Glucose  Recent Labs Lab 01/16/15 1956  GLUCAP 175*    Imaging No results found.   ASSESSMENT / PLAN:  PULMONARY A: At Risk Atelectasis - in setting of prior fever P:   O2 PRN to keep sats > 92% DNR/DNI Follow up CXR in am 4/2  to ensure no developing infiltrate  CARDIOVASCULAR CVL RIJ 3/30 >>> A:  Shock - GNR bacteremia of unclear etiology + hypovolemia.  Levo weaned off 4/1 Atrial Fib - new onset in setting of sepsis,  CHA2DS-VASc = 3 Hx HTN, HLD Aortic Stenosis - mild on ECHO P:  MAP goal > 80m/Hg Continue Heparin gtt, anticipate he will convert.  Continue heparin for now, if remains in AF will convert to oral agent. (discussed with Cardiology).  If remains in AF, consider Cardiology in am 4/2 Holding outpatient antihypertensives Follow up EKG in am  DNR  RENAL A:   AKI - suspect pre-renal in setting of sepsis  Hypophos Hypokalemia Hypomagnesemia  P:   NS @ 75 ml/hr Follow BMP Correct electrolytes as indicated  GASTROINTESTINAL A:   Diarrhea - no abx exposure in last 3 months, ? Intraabdominal infection.  Amylase/lipase, alk phos wnl Poor PO intake - in setting of acute illness.   Baseline functional, lives alone. P:   Diet as tolerated  Discontinue protonix  HEMATOLOGIC A:   Mild Anemia  P:  Heparin gtt for AF Follow CBC  INFECTIOUS A:   SIRS Septic shock - GNR Bacteremia, etiology unclear, consider urine vs intraabdominal? P:   BCx2 3/30 >> GNR >> UA 3/30 >> few bacteria  UC 3/30 >> Strep pneumo 3/31 >> neg  Flu 3/30 >> neg  Stool culture >>   Abx: pip/tazo, start date 3/30> Abx: vancomycin, start date 3/30>  Follow WBC and fever curve Narrow ABX once cultures returned Monitor PCT to determine antibiotic duration  ENDOCRINE A:   Hyperglycemia without history DM P:   Follow glucose on Chem.  Add SSI if consistently greater than 180  NEUROLOGIC / ORTHO A:   No acute issues Arthritis of Knees P:   [facility] PT consult 4/1 (baseline no deficiencies)  FAMILY  - Updates: updated, patient and family extensively 4/1    [facility] SD[facility].      BNoe Gens NP-C  Pulmonary & Critical Care Pgr: [phone] 01/18/2015 9:33 AM

## 2015-01-19 ENCOUNTER — Inpatient Hospital Stay (HOSPITAL_COMMUNITY): Payer: Medicare Other

## 2015-01-19 DIAGNOSIS — I4891 Unspecified atrial fibrillation: Secondary | ICD-10-CM

## 2015-01-19 DIAGNOSIS — I10 Essential (primary) hypertension: Secondary | ICD-10-CM

## 2015-01-19 LAB — CBC WITH DIFFERENTIAL/PLATELET
BASOS PCT: 0 % (ref 0–1)
Basophils Absolute: 0 10*3/uL (ref 0.0–0.1)
EOS ABS: 0.2 10*3/uL (ref 0.0–0.7)
EOS PCT: 3 % (ref 0–5)
HEMATOCRIT: 33.6 % — AB (ref 39.0–52.0)
HEMOGLOBIN: 11.3 g/dL — AB (ref 13.0–17.0)
LYMPHS PCT: 12 % (ref 12–46)
Lymphs Abs: 1 10*3/uL (ref 0.7–4.0)
MCH: 30 pg (ref 26.0–34.0)
MCHC: 33.6 g/dL (ref 30.0–36.0)
MCV: 89.1 fL (ref 78.0–100.0)
MONOS PCT: 13 % — AB (ref 3–12)
Monocytes Absolute: 1.2 10*3/uL — ABNORMAL HIGH (ref 0.1–1.0)
Neutro Abs: 6.4 10*3/uL (ref 1.7–7.7)
Neutrophils Relative %: 72 % (ref 43–77)
PLATELETS: 181 10*3/uL (ref 150–400)
RBC: 3.77 MIL/uL — AB (ref 4.22–5.81)
RDW: 14.2 % (ref 11.5–15.5)
WBC: 8.8 10*3/uL (ref 4.0–10.5)

## 2015-01-19 LAB — HEPARIN LEVEL (UNFRACTIONATED): Heparin Unfractionated: 0.42 IU/mL (ref 0.30–0.70)

## 2015-01-19 LAB — BASIC METABOLIC PANEL
Anion gap: 6 (ref 5–15)
BUN: 25 mg/dL — ABNORMAL HIGH (ref 6–23)
CO2: 22 mmol/L (ref 19–32)
Calcium: 7.7 mg/dL — ABNORMAL LOW (ref 8.4–10.5)
Chloride: 107 mmol/L (ref 96–112)
Creatinine, Ser: 1.28 mg/dL (ref 0.50–1.35)
GFR calc non Af Amer: 49 mL/min — ABNORMAL LOW (ref >90)
GFR, EST AFRICAN AMERICAN: 56 mL/min — AB (ref >90)
GLUCOSE: 117 mg/dL — AB (ref 70–99)
Potassium: 3.8 mmol/L (ref 3.5–5.1)
Sodium: 135 mmol/L (ref 135–145)

## 2015-01-19 LAB — CULTURE, BLOOD (ROUTINE X 2)

## 2015-01-19 LAB — TSH: TSH: 2.215 u[IU]/mL (ref 0.350–4.500)

## 2015-01-19 LAB — PHOSPHORUS: Phosphorus: 2.6 mg/dL (ref 2.3–4.6)

## 2015-01-19 LAB — PROCALCITONIN: PROCALCITONIN: 16.78 ng/mL

## 2015-01-19 LAB — MAGNESIUM: MAGNESIUM: 1.8 mg/dL (ref 1.5–2.5)

## 2015-01-19 MED ORDER — SODIUM CHLORIDE 0.9 % IJ SOLN
10.0000 mL | INTRAMUSCULAR | Status: DC | PRN
  Administered 2015-01-20: 20 mL
  Filled 2015-01-19: qty 40

## 2015-01-19 MED ORDER — APIXABAN 2.5 MG PO TABS
2.5000 mg | ORAL_TABLET | Freq: Two times a day (BID) | ORAL | Status: DC
  Administered 2015-01-19: 2.5 mg via ORAL
  Filled 2015-01-19 (×2): qty 1

## 2015-01-19 MED ORDER — SODIUM CHLORIDE 0.9 % IJ SOLN: 10.0000 mL | Freq: Two times a day (BID) | INTRAMUSCULAR | Status: DC

## 2015-01-19 MED ORDER — HEPARIN (PORCINE) IN NACL 100-0.45 UNIT/ML-% IJ SOLN
1300.0000 [IU]/h | INTRAMUSCULAR | Status: AC
  Filled 2015-01-19: qty 250

## 2015-01-19 MED ORDER — DEXTROSE 5 % IV SOLN
1.0000 g | INTRAVENOUS | Status: DC
  Administered 2015-01-19 – 2015-01-20 (×2): 1 g via INTRAVENOUS
  Filled 2015-01-19 (×2): qty 10

## 2015-01-19 MED ORDER — SIMVASTATIN 10 MG PO TABS
10.0000 mg | ORAL_TABLET | Freq: Every day | ORAL | Status: DC
  Administered 2015-01-19 – 2015-01-20 (×2): 10 mg via ORAL
  Filled 2015-01-19 (×3): qty 1

## 2015-01-19 NOTE — Progress Notes (Addendum)
ANTICOAGULATION CONSULT NOTE - Follow Up  Pharmacy Consult for Heparin Indication: atrial fibrillation  No Known Allergies  Patient Measurements: Height: 5\' 11"  (180.3 cm) Weight: 206 lb 9.1 oz (93.7 kg) IBW/kg (Calculated) : 75.3 Heparin Dosing Weight: actual weight  Vital Signs: Temp: 97.5 F (36.4 C) (04/02 0400) Temp Source: Axillary (04/02 0400) BP: 101/50 mmHg (04/02 0600) Pulse Rate: 86 (04/02 0600)  Labs:  Recent Labs  01/16/15 1958 01/17/15 0149 01/17/15 0815  01/17/15 1410 01/17/15 1835 01/18/15 0441 01/19/15 0447 01/19/15 0448  HGB 14.4 12.6*  --   --  12.1*  --  11.9* 11.3*  --   HCT 42.6 37.3*  --   --  35.9*  --  34.8* 33.6*  --   PLT 213 203  --   --  201  --  195 181  --   LABPROT  --  16.0*  --   --   --   --   --   --   --   INR  --  1.26  --   --   --   --   --   --   --   HEPARINUNFRC  --   --   --   < >  --  0.40 0.37  --  0.42  CREATININE 2.39* 2.05*  --   --  1.59*  --  1.40* 1.28  --   CKTOTAL 53  --   --   --   --   --   --   --   --   CKMB 14.0*  --   --   --   --   --   --   --   --   TROPONINI  --  0.03 0.03  --   --   --  <0.03  --   --   < > = values in this interval not displayed.  Estimated Creatinine Clearance: 47.6 mL/min (by C-G formula based on Cr of 1.28).   Medications:  Infusions:  . sodium chloride 75 mL/hr at 01/19/15 0430  . heparin 1,300 Units (01/18/15 1900)    Assessment: Seth [name] admitted 3/30 with weakness, fall, shock, with new onset afib. Pharmacy consulted to start heparin infusion 3/31 AM.  Cardiology consulted.  Today, 01/19/2015:  Heparin level 0.42, remains therapeutic on heparin at 1300 units/hr  CBC: Hgb 11.3 remains low/decreased, 11.9 and Plt WNL. No bleeding/complications documented.  Goal of Therapy:  Heparin level 0.3-0.7 units/ml Monitor platelets by anticoagulation protocol: Yes   Plan:   Continue heparin infusion at 1300 units/hr.  Daily CBC and HL while on heparin infusion.  Follow up  long-term anticoagulation plans.  Lynann Beaverhristine Bexlee Bergdoll PharmD, BCPS Pager [phone] 01/19/2015 7:07 AM    Addendum: Noted cardiology recommendation for Eliquis 2.5mg  PO BID. With improved renal function, patient no longer meets criteria for reduced dose.  For dose reduction, must meet 2/3 criteria:  MEETS: age > 4680  Does NOT meet: SCr > 1.5 (SCr 1.28 and improving), weight < 60kg (weight 93.7 kg)  Recommendation is for full dose, Eliquis 5mg  PO BID. Please contact pharmacy with any questions.  Lynann Beaverhristine Makinley Muscato PharmD, BCPS Pharmacy [phone]  01/19/2015 10:21 AM

## 2015-01-19 NOTE — Progress Notes (Signed)
[facility] PROGRESS NOTE  TAIM WURM YQM:[id number] DOB: 05-Jan-1925 DOA: 01/16/2015 PCP: No primary care provider on file.  Assessment/Plan: 1. Septic shock -Present on admission, evidenced by hypotension requiring IV pressor support,  lactic acid of 6.53, white count of 26,800, acute kidney injury, positive blood cultures growing Escherichia coli 2, source of infection could be intra-abdominal versus urinary tract. -Patient showing clinical improvement as he has been weaned off of pressor support, white count trending down to 8800 from 26,800 on admission and lactic acid normalizing to 1.7. -Given clinical stability will transfer out of stepdown unit to telemetry -Will repeat blood cultures -Susceptibility testing from blood culture showing Escherichia coli is sensitive to cephalosporins. -Narrow antibiotic regimen with discontinuation of Zosyn, start him on ceftriaxone 1 g IV every 24 hours.  2.  Atrial fibrillation with CHADVasc score of 3 -Patient remains in atrial fibrillation as he is presently rate controlled -Remains on IV heparin -Transthoracic echocardiogram showing ejection fraction 55-60% without wall motion abnormalities.  -Will check a fasting lipid panel and TSH -Case was discussed with Dr Myrtis Ser of cardiology who recommended transitioning to Eliquis at 2.5 mg by mouth twice a day. Kidney function will need to be monitored closely in the outpatient setting to ensure that this is the appropriate dose for him. He will be set up with outpatient follow-up at the cardiology clinic.  3.  Acute kidney injury -Secondary to sepsis/hypotension/bone depletion, resolving as creatinine continues to trend down to 1.28 from 2.39 on 01/17/2015. -Will continue following kidney function  4.  History of hypertension.  -Patient had been on lisinopril and metoprolol in the outpatient setting, these were discontinued due to hypotension in setting of septic shock. -He is now off of IV  pressor support however blood pressures remained low normal range which I'll continue holding antihypertensive agents. With regard to his A. fib he remains rate controlled with a trickle rates in the 70s to 80s. -Will consider restarting low-dose metoprolol if blood pressures allow  5.  Dyslipidemia. -Patient on simvastatin 10 mg by mouth daily  6.  Deconditioning. -Will consult physical therapy  Code Status: DO NOT RESUSCITATE Family Communication:  Disposition Plan: Patient showing clinical improvement will transfer to telemetry   Consultants:  Pulmonary critical care medicine  Telephone conversation made to cardiology   Antibiotics:  Ceftriaxone 1 g IV every 24 hours  HPI/Subjective: Patient is a pleasant 81 year old gentleman with a past medical history of hypertension and dyslipidemia who was admitted to the pulmonary critical care service on 01/16/2015 when he presented with complaints of generalized weakness, functional decline, found to be septic, hypotensive and started on IV pressor support. Initial lab work revealed acute kidney injury having creatinine of 2.39 with BUN of 46, lactic acid of 6.53 and white count of 26,800. Chest x-ray did not show acute cardiopulmonary disease, urinalysis was negative for nitrates and leukocytes showing a few bacteria. He was also found to be in A. fib with RVR. He was admitted to the intensive care unit. He was treated with IV fluids, IV pressors, broad-spectrum empiric IV antibiotic therapy with vancomycin and Zosyn. Over the following days he showed gradual improvement. Blood cultures grew Escherichia coli from both sets, organism sensitive to fluoroquinolones and cephalosporins, showing resistance to ampicillin and gentamicin.  Objective: Filed Vitals:   01/19/15 0758  BP:   Pulse:   Temp: 97.8 F (36.6 C)  Resp:     Intake/Output Summary (Last 24 hours) at 01/19/15 5284 Last data filed at  01/19/15 0600  Gross per 24 hour   Intake 2113.5 ml  Output   2400 ml  Net -286.5 ml   Filed Weights   01/17/15 0133 01/18/15 0440 01/19/15 0400  Weight: 86.2 kg (190 lb 0.6 oz) 90.2 kg (198 lb 13.7 oz) 93.7 kg (206 lb 9.1 oz)    Exam:   General:  Patient is in no acute distress, he is awake, alert, states feeling much better is tolerating by mouth intake  Cardiovascular: Irregular rate and rhythm normal S1-S2 no rubs or gallops has 2/6 systolic ejection murmur  Respiratory: No wheezing rhonchi or rales  Abdomen: Soft nontender nondistended  Musculoskeletal: No edema  Data Reviewed: Basic Metabolic Panel:  Recent Labs Lab 01/16/15 1958 01/17/15 0149 01/17/15 1410 01/18/15 0441 01/19/15 0447  NA 135 135 137 136 135  K 3.6 3.9 3.9 3.6 3.8  CL 100 105 108 108 107  CO2 17* 21 23 21 22   GLUCOSE 167* 189* 111* 132* 117*  BUN 46* 47* 42* 36* 25*  CREATININE 2.39* 2.05* 1.59* 1.40* 1.28  CALCIUM 8.7 8.0* 7.6* 7.8* 7.7*  MG  --  1.8  --  1.9 1.8  PHOS  --  2.7  --  2.2* 2.6   Liver Function Tests:  Recent Labs Lab 01/16/15 1958 01/17/15 0149  AST 45*  --   ALT 30  --   ALKPHOS 102 80  BILITOT 1.4*  --   PROT 6.7  --   ALBUMIN 3.3*  --     Recent Labs Lab 01/17/15 0149  LIPASE 16  AMYLASE 26   No results for input(s): AMMONIA in the last 168 hours. CBC:  Recent Labs Lab 01/16/15 1958 01/17/15 0149 01/17/15 1410 01/18/15 0441 01/19/15 0447  WBC 22.4* 26.8* 19.3* 15.0* 8.8  NEUTROABS 20.4*  --  15.9* 11.6* 6.4  HGB 14.4 12.6* 12.1* 11.9* 11.3*  HCT 42.6 37.3* 35.9* 34.8* 33.6*  MCV 91.0 89.9 89.8 89.7 89.1  PLT 213 203 201 195 181   Cardiac Enzymes:  Recent Labs Lab 01/16/15 1958 01/17/15 0149 01/17/15 0815 01/18/15 0441  CKTOTAL 53  --   --   --   CKMB 14.0*  --   --   --   TROPONINI  --  0.03 0.03 <0.03   BNP (last 3 results)  Recent Labs  01/17/15 0149  BNP 577.8*    ProBNP (last 3 results) No results for input(s): PROBNP in the last 8760  hours.  CBG:  Recent Labs Lab 01/16/15 1956  GLUCAP 175*    Recent Results (from the past 240 hour(s))  Blood culture (routine x 2)     Status: None   Collection Time: 01/16/15  9:13 PM  Result Value Ref Range Status   Specimen Description BLOOD BLOOD LEFT FOREARM  Final   Special Requests BOTTLES DRAWN AEROBIC AND ANAEROBIC 4CC  Final   Culture   Final    ESCHERICHIA COLI Note: Gram Stain Report Called to,Read Back By and Verified With: LISA FREI 01/17/15 1347 BY SMITHERSJ Performed at [facility]    Report Status 01/19/2015 FINAL  Final   Organism ID, Bacteria ESCHERICHIA COLI  Final      Susceptibility   Escherichia coli - MIC*    AMPICILLIN >=32 RESISTANT Resistant     AMPICILLIN/SULBACTAM 8 SENSITIVE Sensitive     CEFAZOLIN <=4 SENSITIVE Sensitive     CEFEPIME <=1 SENSITIVE Sensitive     CEFTAZIDIME <=1 SENSITIVE Sensitive  CEFTRIAXONE <=1 SENSITIVE Sensitive     CIPROFLOXACIN <=0.25 SENSITIVE Sensitive     GENTAMICIN >=16 RESISTANT Resistant     IMIPENEM <=0.25 SENSITIVE Sensitive     PIP/TAZO <=4 SENSITIVE Sensitive     TOBRAMYCIN <=1 SENSITIVE Sensitive     TRIMETH/SULFA <=20 SENSITIVE Sensitive     * ESCHERICHIA COLI  Blood culture (routine x 2)     Status: None   Collection Time: 01/16/15  9:14 PM  Result Value Ref Range Status   Specimen Description BLOOD BLOOD RIGHT FOREARM  Final   Special Requests BOTTLES DRAWN AEROBIC AND ANAEROBIC 3CC  Final   Culture   Final    ESCHERICHIA COLI Note: SUSCEPTIBILITIES PERFORMED ON PREVIOUS CULTURE WITHIN THE LAST 5 DAYS. Note: Gram Stain Report Called to,Read Back By and Verified With: LISA FREI 01/17/15 1348 BY SMITHERSJ Performed at [facility]    Report Status 01/19/2015 FINAL  Final  Urine culture     Status: None   Collection Time: 01/16/15 11:04 PM  Result Value Ref Range Status   Specimen Description URINE, CATHETERIZED  Final   Special Requests vanc/zosyn  Final   Colony Count NO  GROWTH Performed at [facility]   Final   Culture NO GROWTH Performed at [facility]   Final   Report Status 01/18/2015 FINAL  Final  MRSA PCR Screening     Status: None   Collection Time: 01/17/15  1:37 AM  Result Value Ref Range Status   MRSA by PCR NEGATIVE NEGATIVE Final    Comment:        The GeneXpert MRSA Assay (FDA approved for NASAL specimens only), is one component of a comprehensive MRSA colonization surveillance program. It is not intended to diagnose MRSA infection nor to guide or monitor treatment for MRSA infections.      Studies: No results found.  Scheduled Meds: . antiseptic oral rinse  7 mL Mouth Rinse BID  . piperacillin-tazobactam (ZOSYN)  IV  3.375 g Intravenous Q8H   Continuous Infusions: . sodium chloride 75 mL/hr at 01/19/15 0430  . heparin 1,300 Units (01/18/15 1900)    Active Problems:   Septic shock   Shock   Acute renal failure syndrome   Atrial fibrillation, unspecified   Hyperglycemia    Time spent: 40 minutes    Jeralyn Bennett  [facility] Pager [phone]. If 7PM-7AM, please contact night-coverage at [email or web address], password [facility] 01/19/2015, 8:06 AM  LOS: 3 days

## 2015-01-19 NOTE — Evaluation (Signed)
Physical Therapy Evaluation Patient Details Name: Seth [name] MRN: [id number] DOB: 1925/04/14 Today's Date: 01/19/2015   History of Present Illness  81 year old gentleman with a past medical history of hypertension and dyslipidemia who was admitted on 01/16/2015 when he presented with complaints of generalized weakness, functional decline, found to be septic, hypotensive and started on IV pressor support (off pressors 4/1)  Clinical Impression  Pt admitted with above diagnosis. Pt currently with functional limitations due to the deficits listed below (see PT Problem List).  Pt will benefit from skilled PT to increase their independence and safety with mobility to allow discharge to the venue listed below.  Pt mobilizing well however wishes to return to independent baseline as soon as possible so will continue to assist pt with mobility during acute stay.  Recommended pt ambulate with nursing staff as well; pt has his RW from home in room.     Follow Up Recommendations Home health PT (vs none pending progress)    Equipment Recommendations  None recommended by PT    Recommendations for Other Services       Precautions / Restrictions Precautions Precautions: Fall      Mobility  Bed Mobility Overal bed mobility: Needs Assistance Bed Mobility: Supine to Sit     Supine to sit: Supervision     General bed mobility comments: increased time, supervision for multiple lines/leads  Transfers Overall transfer level: Needs assistance Equipment used: Rolling walker (2 wheeled) Transfers: Sit to/from Stand Sit to Stand: Min guard         General transfer comment: verbal cues for safe technique  Ambulation/Gait Ambulation/Gait assistance: Min guard Ambulation Distance (Feet): 180 Feet Assistive device: Rolling walker (2 wheeled) Gait Pattern/deviations: Step-through pattern Gait velocity: decr   General Gait Details: slow but steady pace, HR remained around 107 bpm during  gait, SPO2 WNL room air  Stairs            Wheelchair Mobility    Modified Rankin (Stroke Patients Only)       Balance                                             Pertinent Vitals/Pain Pain Assessment: No/denies pain    Home Living Family/patient expects to be discharged to:: Private residence Living Arrangements: Alone   Type of Home: House Home Access: Stairs to enter   Enter[employer] CorporationEntrance Stairs-Number of Steps: 3-4 (states he remodeled steps since he dislikes ramps) Home Layout: One level Home Equipment: Environmental consultantWalker - 2 wheels      Prior Function Level of Independence: Independent               Hand Dominance        Extremity/Trunk Assessment               Lower Extremity Assessment: Generalized weakness      Cervical / Trunk Assessment: Normal  Communication   Communication: No difficulties  Cognition Arousal/Alertness: Awake/alert Behavior During Therapy: WFL for tasks assessed/performed Overall Cognitive Status: Within Functional Limits for tasks assessed                      General Comments      Exercises        Assessment/Plan    PT Assessment Patient needs continued PT services  PT Diagnosis Generalized weakness   PT Problem  List Decreased strength;Decreased activity tolerance;Decreased mobility;Decreased knowledge of use of DME  PT Treatment Interventions Gait training;DME instruction;Stair training;Patient/family education;Functional mobility training;Therapeutic activities;Therapeutic exercise   PT Goals (Current goals can be found in the Care Plan section) Acute Rehab PT Goals Patient Stated Goal: return to independent PT Goal Formulation: With patient Time For Goal Achievement: 01/26/15 Potential to Achieve Goals: Good    Frequency Min 3X/week   Barriers to discharge        Co-evaluation               End of Session Equipment Utilized During Treatment: Gait belt Activity Tolerance:  Patient tolerated treatment well Patient left: in chair;with call bell/phone within reach;with nursing/sitter in room Nurse Communication: Mobility status         Time: 1610-9604 PT Time Calculation (min) (ACUTE ONLY): 13 min   Charges:   PT Evaluation $Initial PT Evaluation Tier I: 1 Procedure     PT G Codes:        Fey Coghill,KATHrine E 01/19/2015, 8:58 AM Zenovia Jarred, PT, DPT 01/19/2015 Pager: [phone]

## 2015-01-20 LAB — CBC WITH DIFFERENTIAL/PLATELET
BASOS ABS: 0 10*3/uL (ref 0.0–0.1)
Basophils Relative: 0 % (ref 0–1)
EOS PCT: 4 % (ref 0–5)
Eosinophils Absolute: 0.3 10*3/uL (ref 0.0–0.7)
HCT: 34.4 % — ABNORMAL LOW (ref 39.0–52.0)
HEMOGLOBIN: 11.3 g/dL — AB (ref 13.0–17.0)
LYMPHS ABS: 1.2 10*3/uL (ref 0.7–4.0)
Lymphocytes Relative: 16 % (ref 12–46)
MCH: 29.4 pg (ref 26.0–34.0)
MCHC: 32.8 g/dL (ref 30.0–36.0)
MCV: 89.4 fL (ref 78.0–100.0)
MONO ABS: 1.2 10*3/uL — AB (ref 0.1–1.0)
Monocytes Relative: 16 % — ABNORMAL HIGH (ref 3–12)
NEUTROS ABS: 4.6 10*3/uL (ref 1.7–7.7)
NEUTROS PCT: 64 % (ref 43–77)
Platelets: 220 10*3/uL (ref 150–400)
RBC: 3.85 MIL/uL — ABNORMAL LOW (ref 4.22–5.81)
RDW: 14.3 % (ref 11.5–15.5)
WBC: 7.3 10*3/uL (ref 4.0–10.5)

## 2015-01-20 LAB — BASIC METABOLIC PANEL
ANION GAP: 8 (ref 5–15)
BUN: 20 mg/dL (ref 6–23)
CALCIUM: 8.4 mg/dL (ref 8.4–10.5)
CHLORIDE: 108 mmol/L (ref 96–112)
CO2: 23 mmol/L (ref 19–32)
CREATININE: 1.18 mg/dL (ref 0.50–1.35)
GFR calc Af Amer: 62 mL/min — ABNORMAL LOW (ref >90)
GFR calc non Af Amer: 54 mL/min — ABNORMAL LOW (ref >90)
GLUCOSE: 123 mg/dL — AB (ref 70–99)
POTASSIUM: 4.1 mmol/L (ref 3.5–5.1)
Sodium: 139 mmol/L (ref 135–145)

## 2015-01-20 LAB — LIPID PANEL
CHOL/HDL RATIO: 9.3 ratio
Cholesterol: 111 mg/dL (ref 0–200)
HDL: 12 mg/dL — AB (ref >39)
LDL Cholesterol: 55 mg/dL (ref 0–99)
Triglycerides: 220 mg/dL — ABNORMAL HIGH (ref <150)
VLDL: 44 mg/dL — ABNORMAL HIGH (ref 0–40)

## 2015-01-20 LAB — MAGNESIUM: Magnesium: 1.7 mg/dL (ref 1.5–2.5)

## 2015-01-20 LAB — PHOSPHORUS: PHOSPHORUS: 3.4 mg/dL (ref 2.3–4.6)

## 2015-01-20 MED ORDER — CEFUROXIME AXETIL 500 MG PO TABS
500.0000 mg | ORAL_TABLET | Freq: Two times a day (BID) | ORAL | Status: DC
  Administered 2015-01-20 – 2015-01-21 (×2): 500 mg via ORAL
  Filled 2015-01-20 (×3): qty 1

## 2015-01-20 MED ORDER — APIXABAN 5 MG PO TABS
5.0000 mg | ORAL_TABLET | Freq: Two times a day (BID) | ORAL | Status: DC
  Administered 2015-01-20 – 2015-01-21 (×3): 5 mg via ORAL
  Filled 2015-01-20 (×3): qty 1

## 2015-01-20 NOTE — Progress Notes (Signed)
CARE MANAGEMENT NOTE 01/20/2015  Patient:  Seth [name],Seth [name]   Account Number:  [id number]  Date Initiated:  01/17/2015  Documentation initiated by:  DAVIS,RHONDA  Subjective/Objective Assessment:   sepsis with hypotensive state     Action/Plan:   from home   Anticipated DC Date:  01/20/2015   Anticipated DC Plan:  HOME/SELF CARE  In-house referral  NA      DC Planning Services  CM consult      [facility]   Choice offered to / List presented to:  C-4 Adult Children   DME arranged  3-N-1      DME agency  [facility]        Status of service:  In process, will continue to follow Medicare Important Message given?  YES (If response is "NO", the following Medicare IM given date fields will be blank) Date Medicare IM given:  01/20/2015 Medicare IM given by:  [facility]Ulus Hazen Date Additional Medicare IM given:   Additional Medicare IM given by:    Discharge Disposition:  HOME W HOME HEALTH SERVICES  Per UR Regulation:  Reviewed for med. necessity/level of care/duration of stay  If discussed at Long Length of Stay Meetings, dates discussed:    Comments:  01/20/3015 1500 NCM spoke to pt and [facility] Provided [facility] list to son. Requesting 3n1 for home. Contacted [facility] for DME for home. [facility] review [facility] and [facility] Pt has RW at home. Isidoro DonningAlesia Baran Kuhrt RN CCM Case Mgmt phone [phone]   January 17, 2015/Rhonda L. Earlene Plateravis, RN, BSN, CCM. Case Management [employer] [phone] No discharge needs present of time of review.

## 2015-01-20 NOTE — Progress Notes (Signed)
[facility] PROGRESS NOTE  Seth [name] ZOX:[id number] DOB: 05/29/1925 DOA: 01/16/2015 PCP: No primary care provider on file.  Assessment/Plan: 1. Septic shock -Present on admission, evidenced by hypotension requiring IV pressor support,  lactic acid of 6.53, white count of 26,800, acute kidney injury, positive blood cultures growing Escherichia coli 2, source of infection could be intra-abdominal versus urinary tract. -Patient showing clinical improvement as he has been weaned off of pressor support, white count trending down to 8800 from 26,800 on admission and lactic acid normalizing to 1.7. -Given clinical stability will transfer out of stepdown unit to telemetry -Will repeat blood cultures -Susceptibility testing from blood culture showing Escherichia coli is sensitive to cephalosporins. -Will transition him to Ceftin 500 mg PO BID  2.  Atrial fibrillation with CHADVasc score of 3 -Patient remains in atrial fibrillation as he is presently rate controlled -Remains on IV heparin -Transthoracic echocardiogram showing ejection fraction 55-60% without wall motion abnormalities.  -TSH=2.215 -Patient now on Eliquis 5 mg PO BID  3.  Acute kidney injury -Secondary to sepsis/hypotension/bone depletion, resolving as creatinine continues to trend down to 1.28 from 2.39 on 01/17/2015. -Kidney function stable  4.  History of hypertension.  -Patient had been on lisinopril and metoprolol in the outpatient setting, these were discontinued due to hypotension in setting of septic shock. -He is now off of IV pressor support however blood pressures remained low normal range which I'll continue holding antihypertensive agents. With regard to his A. fib he remains rate controlled with a ventricular rates in the 70s to 80s. -Will restart metoprolol 12.5 mg PO BID  5.  Dyslipidemia. -Patient on simvastatin 10 mg by mouth daily  6.  Deconditioning. -PT recommending HH PT, Face to Face  completed.   Code Status: DO NOT RESUSCITATE Family Communication: I spoke with family members at bedside Disposition Plan: Anticipate discharge in the next 24 hours.    Consultants:  Pulmonary critical care medicine  Telephone conversation made to cardiology   Antibiotics:  Ceftriaxone 1 g IV every 24 hours discontinued on 01/20/2015  Ceftin 500 mg PO BID  HPI/Subjective: Patient is a pleasant 81 year old gentleman with a past medical history of hypertension and dyslipidemia who was admitted to the pulmonary critical care service on 01/16/2015 when he presented with complaints of generalized weakness, functional decline, found to be septic, hypotensive and started on IV pressor support. Initial lab work revealed acute kidney injury having creatinine of 2.39 with BUN of 46, lactic acid of 6.53 and white count of 26,800. Chest x-ray did not show acute cardiopulmonary disease, urinalysis was negative for nitrates and leukocytes showing a few bacteria. He was also found to be in A. fib with RVR. He was admitted to the intensive care unit. He was treated with IV fluids, IV pressors, broad-spectrum empiric IV antibiotic therapy with vancomycin and Zosyn. Over the following days he showed gradual improvement. Blood cultures grew Escherichia coli from both sets, organism sensitive to fluoroquinolones and cephalosporins, showing resistance to ampicillin and gentamicin.  Objective: Filed Vitals:   01/20/15 0432  BP: 121/77  Pulse: 86  Temp: 98 F (36.7 C)  Resp: 20    Intake/Output Summary (Last 24 hours) at 01/20/15 1154 Last data filed at 01/20/15 1045  Gross per 24 hour  Intake    811 ml  Output   1851 ml  Net  -1040 ml   Filed Weights   01/18/15 0440 01/19/15 0400 01/20/15 0432  Weight: 90.2 kg (198 lb 13.7 oz) 93.7  kg (206 lb 9.1 oz) 89.858 kg (198 lb 1.6 oz)    Exam:   General:  Patient is in no acute distress, he is awake, alert, states feeling much better is tolerating by  mouth intake  Cardiovascular: Irregular rate and rhythm normal S1-S2 no rubs or gallops has 2/6 systolic ejection murmur  Respiratory: No wheezing rhonchi or rales  Abdomen: Soft nontender nondistended  Musculoskeletal: No edema  Data Reviewed: Basic Metabolic Panel:  Recent Labs Lab 01/17/15 0149 01/17/15 1410 01/18/15 0441 01/19/15 0447 01/20/15 0500  NA 135 137 136 135 139  K 3.9 3.9 3.6 3.8 4.1  CL 105 108 108 107 108  CO2 GLUCOSE 189* 111* 132* 117* 123*  BUN 47* 42* 36* 25* 20  CREATININE 2.05* 1.59* 1.40* 1.28 1.18  CALCIUM 8.0* 7.6* 7.8* 7.7* 8.4  MG 1.8  --  1.9 1.8 1.7  PHOS 2.7  --  2.2* 2.6 3.4   Liver Function Tests:  Recent Labs Lab 01/16/15 1958 01/17/15 0149  AST 45*  --   ALT 30  --   ALKPHOS 102 80  BILITOT 1.4*  --   PROT 6.7  --   ALBUMIN 3.3*  --     Recent Labs Lab 01/17/15 0149  LIPASE 16  AMYLASE 26   No results for input(s): AMMONIA in the last 168 hours. CBC:  Recent Labs Lab 01/16/15 1958 01/17/15 0149 01/17/15 1410 01/18/15 0441 01/19/15 0447 01/20/15 0500  WBC 22.4* 26.8* 19.3* 15.0* 8.8 7.3  NEUTROABS 20.4*  --  15.9* 11.6* 6.4 4.6  HGB 14.4 12.6* 12.1* 11.9* 11.3* 11.3*  HCT 42.6 37.3* 35.9* 34.8* 33.6* 34.4*  MCV 91.0 89.9 89.8 89.7 89.1 89.4  PLT 213 203 201 195 181 220   Cardiac Enzymes:  Recent Labs Lab 01/16/15 1958 01/17/15 0149 01/17/15 0815 01/18/15 0441  CKTOTAL 53  --   --   --   CKMB 14.0*  --   --   --   TROPONINI  --  0.03 0.03 <0.03   BNP (last 3 results)  Recent Labs  01/17/15 0149  BNP 577.8*    ProBNP (last 3 results) No results for input(s): PROBNP in the last 8760 hours.  CBG:  Recent Labs Lab 01/16/15 1956  GLUCAP 175*    Recent Results (from the past 240 hour(s))  Blood culture (routine x 2)     Status: None   Collection Time: 01/16/15  9:13 PM  Result Value Ref Range Status   Specimen Description BLOOD BLOOD LEFT FOREARM  Final   Special  Requests BOTTLES DRAWN AEROBIC AND ANAEROBIC 4CC  Final   Culture   Final    ESCHERICHIA COLI Note: Gram Stain Report Called to,Read Back By and Verified With: LISA FREI 01/17/15 1347 BY SMITHERSJ Performed at [facility]    Report Status 01/19/2015 FINAL  Final   Organism ID, Bacteria ESCHERICHIA COLI  Final      Susceptibility   Escherichia coli - MIC*    AMPICILLIN >=32 RESISTANT Resistant     AMPICILLIN/SULBACTAM 8 SENSITIVE Sensitive     CEFAZOLIN <=4 SENSITIVE Sensitive     CEFEPIME <=1 SENSITIVE Sensitive     CEFTAZIDIME <=1 SENSITIVE Sensitive     CEFTRIAXONE <=1 SENSITIVE Sensitive     CIPROFLOXACIN <=0.25 SENSITIVE Sensitive     GENTAMICIN >=16 RESISTANT Resistant     IMIPENEM <=0.25 SENSITIVE Sensitive     PIP/TAZO <=4 SENSITIVE Sensitive  TOBRAMYCIN <=1 SENSITIVE Sensitive     TRIMETH/SULFA <=20 SENSITIVE Sensitive     * ESCHERICHIA COLI  Blood culture (routine x 2)     Status: None   Collection Time: 01/16/15  9:14 PM  Result Value Ref Range Status   Specimen Description BLOOD BLOOD RIGHT FOREARM  Final   Special Requests BOTTLES DRAWN AEROBIC AND ANAEROBIC 3CC  Final   Culture   Final    ESCHERICHIA COLI Note: SUSCEPTIBILITIES PERFORMED ON PREVIOUS CULTURE WITHIN THE LAST 5 DAYS. Note: Gram Stain Report Called to,Read Back By and Verified With: LISA FREI 01/17/15 1348 BY SMITHERSJ [facility]    Report Status 01/19/2015 FINAL  Final  Urine culture     Status: None   Collection Time: 01/16/15 11:04 PM  Result Value Ref Range Status   Specimen Description URINE, CATHETERIZED  Final   Special Requests vanc/zosyn  Final   Colony Count NO GROWTH [facility]   Final   Culture NO GROWTH [facility] at [facility]   Final   Report Status 01/18/2015 FINAL  Final  MRSA PCR Screening     Status: None   Collection Time: 01/17/15  1:37 AM  Result Value Ref Range Status   MRSA by PCR NEGATIVE NEGATIVE Final     Comment:        The GeneXpert MRSA Assay (FDA approved for NASAL specimens only), is one component of a comprehensive MRSA colonization surveillance program. It is not intended to diagnose MRSA infection nor to guide or monitor treatment for MRSA infections.      Studies: Dg Chest Port 1 View  01/19/2015   CLINICAL DATA:  Sepsis, hypertension  EXAM: PORTABLE CHEST - 1 VIEW  COMPARISON:  01/16/2015  FINDINGS: Cardiac shadow is enlarged. A right jugular central line is again seen at the cavoatrial junction. No pneumothorax is seen. The lungs are well aerated with minimal left basilar atelectasis.  IMPRESSION: Minimal left basilar atelectasis.  No new focal abnormality is seen.   Electronically Signed   By: Alcide CleverMark  Lukens M.D.   On: 01/19/2015 08:11    Scheduled Meds: . antiseptic oral rinse  7 mL Mouth Rinse BID  . apixaban  5 mg Oral BID  . cefUROXime  500 mg Oral BID WC  . simvastatin  10 mg Oral q1800  . sodium chloride  10-40 mL Intracatheter Q12H   Continuous Infusions:    Active Problems:   Septic shock   Shock   Acute renal failure syndrome   Atrial fibrillation, unspecified   Hyperglycemia    Time spent: 30 minutes    Jeralyn BennettZAMORA, Seth Guyett  [facility] Pager [phone]. If 7PM-7AM, please contact night-coverage at [email or web address], password [email or web address] 01/20/2015, 11:54 AM  LOS: 4 days

## 2015-01-21 LAB — CBC WITH DIFFERENTIAL/PLATELET
Basophils Absolute: 0.1 10*3/uL (ref 0.0–0.1)
Basophils Relative: 1 % (ref 0–1)
EOS ABS: 0.4 10*3/uL (ref 0.0–0.7)
EOS PCT: 4 % (ref 0–5)
HCT: 37.1 % — ABNORMAL LOW (ref 39.0–52.0)
Hemoglobin: 12.2 g/dL — ABNORMAL LOW (ref 13.0–17.0)
Lymphocytes Relative: 17 % (ref 12–46)
Lymphs Abs: 1.5 10*3/uL (ref 0.7–4.0)
MCH: 29.5 pg (ref 26.0–34.0)
MCHC: 32.9 g/dL (ref 30.0–36.0)
MCV: 89.8 fL (ref 78.0–100.0)
Monocytes Absolute: 1.4 10*3/uL — ABNORMAL HIGH (ref 0.1–1.0)
Monocytes Relative: 16 % — ABNORMAL HIGH (ref 3–12)
NEUTROS PCT: 62 % (ref 43–77)
Neutro Abs: 5.5 10*3/uL (ref 1.7–7.7)
Platelets: 261 10*3/uL (ref 150–400)
RBC: 4.13 MIL/uL — ABNORMAL LOW (ref 4.22–5.81)
RDW: 14.2 % (ref 11.5–15.5)
WBC: 8.9 10*3/uL (ref 4.0–10.5)

## 2015-01-21 LAB — BASIC METABOLIC PANEL
ANION GAP: 9 (ref 5–15)
BUN: 20 mg/dL (ref 6–23)
CO2: 25 mmol/L (ref 19–32)
CREATININE: 1.23 mg/dL (ref 0.50–1.35)
Calcium: 8.9 mg/dL (ref 8.4–10.5)
Chloride: 105 mmol/L (ref 96–112)
GFR, EST AFRICAN AMERICAN: 59 mL/min — AB (ref >90)
GFR, EST NON AFRICAN AMERICAN: 51 mL/min — AB (ref >90)
Glucose, Bld: 134 mg/dL — ABNORMAL HIGH (ref 70–99)
Potassium: 4.1 mmol/L (ref 3.5–5.1)
Sodium: 139 mmol/L (ref 135–145)

## 2015-01-21 LAB — MAGNESIUM: MAGNESIUM: 1.7 mg/dL (ref 1.5–2.5)

## 2015-01-21 LAB — PHOSPHORUS: Phosphorus: 3.6 mg/dL (ref 2.3–4.6)

## 2015-01-21 MED ORDER — CEFUROXIME AXETIL 500 MG PO TABS: 500.0000 mg | ORAL_TABLET | Freq: Two times a day (BID) | ORAL | Status: DC

## 2015-01-21 MED ORDER — APIXABAN 5 MG PO TABS: 5.0000 mg | ORAL_TABLET | Freq: Two times a day (BID) | ORAL | Status: DC

## 2015-01-21 NOTE — Progress Notes (Signed)
ANTICOAGULATION CONSULT NOTE - Follow Up  Pharmacy Consult for Heparin -->eliquis (apixaban) Indication: atrial fibrillation  No Known Allergies  Patient Measurements: Height: 5\' 11"  (180.3 cm) Weight: 198 lb 1.6 oz (89.858 kg) IBW/kg (Calculated) : 75.3 Heparin Dosing Weight: actual weight  Vital Signs: Temp: 98.1 F (36.7 C) (04/04 0452) Temp Source: Oral (04/04 0452) BP: 135/79 mmHg (04/04 0452) Pulse Rate: 94 (04/04 0452)  Labs:  Recent Labs  01/19/15 0447 01/19/15 0448 01/20/15 0500 01/21/15 0358  HGB 11.3*  --  11.3* 12.2*  HCT 33.6*  --  34.4* 37.1*  PLT 181  --  220 261  HEPARINUNFRC  --  0.42  --   --   CREATININE 1.28  --  1.18 1.23    Estimated Creatinine Clearance: 45.1 mL/min (by C-G formula based on Cr of 1.23).   Medications:  Infusions:     Assessment: [age] yoM admitted 3/30 with weakness, fall, shock, with new onset afib. Pharmacy consulted to start heparin infusion 3/31 AM.  Cardiology consulted.  Today, 01/21/2015:  Heparin changed to eliquis   CBC: Hgb improved to 12.2, and Plt WNL. No bleeding/complications documented.  Goal of Therapy:  Dose per indication   Plan:   Continue apixaban 5mg  BID  Does not meet 2/3 criteria for dose reduction (to 2.5mg  BID) per manufacturer's  recommended dosing in package insert  Meets 1/3 criteria (age >= 6940yrs)  Does not meet wt <= 60kg or SCr >1.5mg /dl (AKI noted on admission but SCr appears to be currently at his baseline which is < 1.5mg /dl)  Educate patient and provide discount card  Juliette Alcideustin Zeigler, PharmD, BCPS.   Pager: [phone] 01/21/2015 8:44 AM

## 2015-01-21 NOTE — Discharge Summary (Addendum)
Physician Discharge Summary  ZYEIR DYMEK WUJ:[id number] DOB: 1925/04/05 DOA: 01/16/2015  PCP: No primary care provider on file.  Admit date: 01/16/2015 Discharge date: 01/21/2015  Time spent: 35 minutes  Recommendations for Outpatient Follow-up:  1. Please follow-up on a BMP on hospital follow-up. During this hospitalization was found to be in acute kidney injury, resolving after IV fluid resuscitation. On the day of discharge she had a creatinine 1.23 and BUN of 20. 2. Patient started on anticoagulation with Eliquis. Case was discussed with pharmacy who reported that for patients creatinine clearance the indicated dose was 5 mg by mouth twice a day. BMP will need to be followed closely in the outpatient setting. 3. Follow-up on blood pressures. He was discharged on his home dose of metoprolol 50 mg by mouth daily, lisinopril was had given concerns for hypotension. On morning of discharge he had a blood pressure 135/79. 4. He was set up with home health services for home PT on discharge   Discharge Diagnoses:  Active Problems:   Septic shock   Shock   Acute renal failure syndrome   Atrial fibrillation, unspecified   Hyperglycemia   Discharge Condition: Stable  Diet recommendation: Heart healthy  Filed Weights   01/18/15 0440 01/19/15 0400 01/20/15 0432  Weight: 90.2 kg (198 lb 13.7 oz) 93.7 kg (206 lb 9.1 oz) 89.858 kg (198 lb 1.6 oz)    History of present illness:  81 year old male with PMH as below, which is significant for hypertension. Recently complained of abdominal pain on 3/26 after big dinner, but no other complaints. Presented 3/30 when he was found down at home by son, however, he was conscious. Remembers falling, no dizziness, has bad knees and the "gave out". No LOC. He was brought to [facility] ED where he was found to be hypotensive with elevated lactic. He was given volume and antibiotics. Lactic somewhat improved, but did not clear. BP still soft. CVL placed in ED and  pressors initiated. PCCM to admit.  Hospital Course:  Patient is a pleasant 81 year old gentleman with a past medical history of hypertension and dyslipidemia who was admitted to the pulmonary critical care service on 01/16/2015 when he presented with complaints of generalized weakness, functional decline, found to be septic, hypotensive and started on IV pressor support. Initial lab work revealed acute kidney injury having creatinine of 2.39 with BUN of 46, lactic acid of 6.53 and white count of 26,800. Chest x-ray did not show acute cardiopulmonary disease, urinalysis was negative for nitrates and leukocytes showing a few bacteria. He was also found to be in A. fib with RVR. He was admitted to the intensive care unit. He was treated with IV fluids, IV pressors, broad-spectrum empiric IV antibiotic therapy with vancomycin and Zosyn. Over the following days he showed gradual improvement. Blood cultures grew Escherichia coli from both sets, organism sensitive to fluoroquinolones and cephalosporins, showing resistance to ampicillin and gentamicin. Suspect source of infection to be from urinary tract infection. He was transition to Ceftin 500 mg by mouth twice a day. Physical therapy was consulted during this hospitalization recommending home health services with home PT. Other issues addressed during this hospitalization include Atrial Fibrillation. It is unclear the duration of his atrial fibrillation, however having a CHADVasc score of 3 he was started on anticoagulation with Eliquis. This was discussed with Dr. Myrtis Ser of cardiology who initially recommended 2.5 mg PO BID. Given significant improvement to his kidney function pharmacy recommended dose of 5 mg by mouth twice  a day. He was discharged on Eliquis 5 mg by mouth twice a day per pharmacy's recommendations. This follow-up on BMP on hospital follow-up.   Consultations:  Pulmonary critical care medicine  Telephone call to Cardiology  Discharge  Exam: Filed Vitals:   01/21/15 0452  BP: 135/79  Pulse: 94  Temp: 98.1 F (36.7 C)  Resp: 20    General: Patient is in no acute distress, he is awake, alert, states feeling much better is tolerating by mouth intake, ambulate down the hallway several times  Cardiovascular: Irregular rate and rhythm normal S1-S2 no rubs or gallops has 2/6 systolic ejection murmur  Respiratory: No wheezing rhonchi or rales  Abdomen: Soft nontender nondistended  Musculoskeletal: No edema  Discharge Instructions   Discharge Instructions    Call MD for:  difficulty breathing, headache or visual disturbances    Complete by:  As directed      Call MD for:  extreme fatigue    Complete by:  As directed      Call MD for:  hives    Complete by:  As directed      Call MD for:  persistant dizziness or light-headedness    Complete by:  As directed      Call MD for:  persistant nausea and vomiting    Complete by:  As directed      Call MD for:  redness, tenderness, or signs of infection (pain, swelling, redness, odor or green/yellow discharge around incision site)    Complete by:  As directed      Call MD for:  severe uncontrolled pain    Complete by:  As directed      Call MD for:  temperature >100.4    Complete by:  As directed      Diet - low sodium heart healthy    Complete by:  As directed      Increase activity slowly    Complete by:  As directed           Current Discharge Medication List    START taking these medications   Details  apixaban (ELIQUIS) 5 MG TABS tablet Take 1 tablet (5 mg total) by mouth 2 (two) times daily. Qty: 60 tablet, Refills: 60    cefUROXime (CEFTIN) 500 MG tablet Take 1 tablet (500 mg total) by mouth 2 (two) times daily with a meal. Qty: 24 tablet, Refills: 0      CONTINUE these medications which have NOT CHANGED   Details  CINNAMON PO Take 1 capsule by mouth daily.    metoprolol succinate (TOPROL-XL) 50 MG 24 hr tablet Take 50 mg by mouth daily. Take  with or immediately following a meal.    Omega-3 Fatty Acids (FISH OIL PO) Take 1 capsule by mouth daily.    simvastatin (ZOCOR) 10 MG tablet Take 10 mg by mouth at bedtime.      STOP taking these medications     aspirin 81 MG tablet      lisinopril (PRINIVIL,ZESTRIL) 20 MG tablet        No Known Allergies Follow-up Information    Follow up with [facility].   Why:  Home Health Physical Therapy   Contact information:   [address] [place] [address] [phone]       Follow up with  Duane Lope, MD In 1 week.   Specialty:  Family Medicine   Contact information:   [address] [place] [address] [phone]  The results of significant diagnostics from this hospitalization (including imaging, microbiology, ancillary and laboratory) are listed below for reference.    Significant Diagnostic Studies: Dg Chest Port 1 View  01/19/2015   CLINICAL DATA:  Sepsis, hypertension  EXAM: PORTABLE CHEST - 1 VIEW  COMPARISON:  01/16/2015  FINDINGS: Cardiac shadow is enlarged. A right jugular central line is again seen at the cavoatrial junction. No pneumothorax is seen. The lungs are well aerated with minimal left basilar atelectasis.  IMPRESSION: Minimal left basilar atelectasis.  No new focal abnormality is seen.   Electronically Signed   By: Alcide CleverMark  Lukens M.D.   On: 01/19/2015 08:11   Dg Chest Portable 1 View  01/16/2015   CLINICAL DATA:  Evaluate central line placement. Weakness and fever. History of hypertension.  EXAM: PORTABLE CHEST - 1 VIEW  COMPARISON:  Chest radiograph January 16, 2015 at 2036 hours  FINDINGS: Interval placement of RIGHT internal jugular central venous catheter with distal tip projecting in proximal RIGHT atrium. No pneumothorax.  Stable cardiomegaly. Pulmonary vascular congestion mild interstitial prominence without pleural effusion or focal consolidation. No pneumothorax.  High-riding humeral heads can be seen with  remote rotator cuff injuries. Soft tissue planes are nonsuspicious.  IMPRESSION: Interval placement of RIGHT internal jugular central venous catheter with distal tip projecting in proximal RIGHT atrium. No pneumothorax.  Stable cardiomegaly, pulmonary vascular congestion with suspected early interstitial edema.   Electronically Signed   By: Awilda Metroourtnay  Bloomer   On: 01/16/2015 23:20   Dg Chest Port 1 View  01/16/2015   CLINICAL DATA:  Weakness.  Fever.  Fall today.  EXAM: PORTABLE CHEST - 1 VIEW  COMPARISON:  04/29/2010  FINDINGS: Patient rotated right. Moderate cardiomegaly. No pleural effusion or pneumothorax. Mild left hemidiaphragm elevation. No congestive failure. Clear lungs.  IMPRESSION: Cardiomegaly, without acute disease.   Electronically Signed   By: Jeronimo GreavesKyle  Talbot M.D.   On: 01/16/2015 20:50    Microbiology: Recent Results (from the past 240 hour(s))  Blood culture (routine x 2)     Status: None   Collection Time: 01/16/15  9:13 PM  Result Value Ref Range Status   Specimen Description BLOOD BLOOD LEFT FOREARM  Final   Special Requests BOTTLES DRAWN AEROBIC AND ANAEROBIC 4CC  Final   Culture   Final    ESCHERICHIA COLI Note: Gram Stain Report Called to,Read Back By and Verified With: LISA FREI 01/17/15 1347 BY SMITHERSJ Performed at [facility]    Report Status 01/19/2015 FINAL  Final   Organism ID, Bacteria ESCHERICHIA COLI  Final      Susceptibility   Escherichia coli - MIC*    AMPICILLIN >=32 RESISTANT Resistant     AMPICILLIN/SULBACTAM 8 SENSITIVE Sensitive     CEFAZOLIN <=4 SENSITIVE Sensitive     CEFEPIME <=1 SENSITIVE Sensitive     CEFTAZIDIME <=1 SENSITIVE Sensitive     CEFTRIAXONE <=1 SENSITIVE Sensitive     CIPROFLOXACIN <=0.25 SENSITIVE Sensitive     GENTAMICIN >=16 RESISTANT Resistant     IMIPENEM <=0.25 SENSITIVE Sensitive     PIP/TAZO <=4 SENSITIVE Sensitive     TOBRAMYCIN <=1 SENSITIVE Sensitive     TRIMETH/SULFA <=20 SENSITIVE Sensitive     *  ESCHERICHIA COLI  Blood culture (routine x 2)     Status: None   Collection Time: 01/16/15  9:14 PM  Result Value Ref Range Status   Specimen Description BLOOD BLOOD RIGHT FOREARM  Final   Special Requests BOTTLES DRAWN AEROBIC AND ANAEROBIC  3CC  Final   Culture   Final    ESCHERICHIA COLI Note: SUSCEPTIBILITIES PERFORMED ON PREVIOUS CULTURE WITHIN THE LAST 5 DAYS. Note: Gram Stain Report Called to,Read Back By and Verified With: LISA FREI 01/17/15 1348 BY SMITHERSJ Performed at [facility]    Report Status 01/19/2015 FINAL  Final  Urine culture     Status: None   Collection Time: 01/16/15 11:04 PM  Result Value Ref Range Status   Specimen Description URINE, CATHETERIZED  Final   Special Requests vanc/zosyn  Final   Colony Count NO GROWTH Performed at [facility]   Final   Culture NO GROWTH Performed at [facility]   Final   Report Status 01/18/2015 FINAL  Final  MRSA PCR Screening     Status: None   Collection Time: 01/17/15  1:37 AM  Result Value Ref Range Status   MRSA by PCR NEGATIVE NEGATIVE Final    Comment:        The GeneXpert MRSA Assay (FDA approved for NASAL specimens only), is one component of a comprehensive MRSA colonization surveillance program. It is not intended to diagnose MRSA infection nor to guide or monitor treatment for MRSA infections.      Labs: Basic Metabolic Panel:  Recent Labs Lab 01/17/15 0149 01/17/15 1410 01/18/15 0441 01/19/15 0447 01/20/15 0500 01/21/15 0358  NA 135 137 136 135 139 139  K 3.9 3.9 3.6 3.8 4.1 4.1  CL 105 108 108 107 108 105  CO2 GLUCOSE 189* 111* 132* 117* 123* 134*  BUN 47* 42* 36* 25* 20 20  CREATININE 2.05* 1.59* 1.40* 1.28 1.18 1.23  CALCIUM 8.0* 7.6* 7.8* 7.7* 8.4 8.9  MG 1.8  --  1.9 1.8 1.7 1.7  PHOS 2.7  --  2.2* 2.6 3.4 3.6   Liver Function Tests:  Recent Labs Lab 01/16/15 1958 01/17/15 0149  AST 45*  --   ALT 30  --   ALKPHOS 102 80  BILITOT  1.4*  --   PROT 6.7  --   ALBUMIN 3.3*  --     Recent Labs Lab 01/17/15 0149  LIPASE 16  AMYLASE 26   No results for input(s): AMMONIA in the last 168 hours. CBC:  Recent Labs Lab 01/17/15 1410 01/18/15 0441 01/19/15 0447 01/20/15 0500 01/21/15 0358  WBC 19.3* 15.0* 8.8 7.3 8.9  NEUTROABS 15.9* 11.6* 6.4 4.6 5.5  HGB 12.1* 11.9* 11.3* 11.3* 12.2*  HCT 35.9* 34.8* 33.6* 34.4* 37.1*  MCV 89.8 89.7 89.1 89.4 89.8  PLT 201 195 181 220 261   Cardiac Enzymes:  Recent Labs Lab 01/16/15 1958 01/17/15 0149 01/17/15 0815 01/18/15 0441  CKTOTAL 53  --   --   --   CKMB 14.0*  --   --   --   TROPONINI  --  0.03 0.03 <0.03   BNP: BNP (last 3 results)  Recent Labs  01/17/15 0149  BNP 577.8*    ProBNP (last 3 results) No results for input(s): PROBNP in the last 8760 hours.  CBG:  Recent Labs Lab 01/16/15 1956  GLUCAP 175*       Signed:  Jeralyn Bennett  [facility] 01/21/2015, 10:23 AM

## 2015-01-21 NOTE — Discharge Instructions (Signed)
Information on my medicine - ELIQUIS® (apixaban) ° °This medication education was reviewed with me or my healthcare representative as part of my discharge preparation.  The pharmacist that spoke with me during my hospital stay was:  Elisabet Gutzmer George, RPH ° °Why was Eliquis® prescribed for you? °Eliquis® was prescribed for you to reduce the risk of a blood clot forming that can cause a stroke if you have a medical condition called atrial fibrillation (a type of irregular heartbeat). ° °What do You need to know about Eliquis® ? °Take your Eliquis® TWICE DAILY - one tablet in the morning and one tablet in the evening with or without food. If you have difficulty swallowing the tablet whole please discuss with your pharmacist how to take the medication safely. ° °Take Eliquis® exactly as prescribed by your doctor and DO NOT stop taking Eliquis® without talking to the doctor who prescribed the medication.  Stopping may increase your risk of developing a stroke.  Refill your prescription before you run out. ° °After discharge, you should have regular check-up appointments with your healthcare provider that is prescribing your Eliquis®.  In the future your dose may need to be changed if your kidney function or weight changes by a significant amount or as you get older. ° °What do you do if you miss a dose? °If you miss a dose, take it as soon as you remember on the same day and resume taking twice daily.  Do not take more than one dose of ELIQUIS at the same time to make up a missed dose. ° °Important Safety Information °A possible side effect of Eliquis® is bleeding. You should call your healthcare provider right away if you experience any of the following: °  Bleeding from an injury or your nose that does not stop. °  Unusual colored urine (red or dark brown) or unusual colored stools (red or black). °  Unusual bruising for unknown reasons. °  A serious fall or if you hit your head (even if there is no  bleeding). ° °Some medicines may interact with Eliquis® and might increase your risk of bleeding or clotting while on Eliquis®. To help avoid this, consult your healthcare provider or pharmacist prior to using any new prescription or non-prescription medications, including herbals, vitamins, non-steroidal anti-inflammatory drugs (NSAIDs) and supplements. ° °This website has more information on Eliquis® (apixaban): http://www.eliquis.com/eliquis/home ° °

## 2015-01-22 DIAGNOSIS — E785 Hyperlipidemia, unspecified: Secondary | ICD-10-CM | POA: Diagnosis not present

## 2015-01-22 DIAGNOSIS — Z9181 History of falling: Secondary | ICD-10-CM | POA: Diagnosis not present

## 2015-01-22 DIAGNOSIS — Z7901 Long term (current) use of anticoagulants: Secondary | ICD-10-CM | POA: Diagnosis not present

## 2015-01-22 DIAGNOSIS — R5381 Other malaise: Secondary | ICD-10-CM | POA: Diagnosis not present

## 2015-01-22 DIAGNOSIS — B9620 Unspecified Escherichia coli [E. coli] as the cause of diseases classified elsewhere: Secondary | ICD-10-CM | POA: Diagnosis not present

## 2015-01-22 DIAGNOSIS — N390 Urinary tract infection, site not specified: Secondary | ICD-10-CM | POA: Diagnosis not present

## 2015-01-22 DIAGNOSIS — I4891 Unspecified atrial fibrillation: Secondary | ICD-10-CM | POA: Diagnosis not present

## 2015-01-22 DIAGNOSIS — R6521 Severe sepsis with septic shock: Secondary | ICD-10-CM | POA: Diagnosis not present

## 2015-01-23 DIAGNOSIS — A419 Sepsis, unspecified organism: Secondary | ICD-10-CM | POA: Diagnosis not present

## 2015-01-23 DIAGNOSIS — Z09 Encounter for follow-up examination after completed treatment for conditions other than malignant neoplasm: Secondary | ICD-10-CM | POA: Diagnosis not present

## 2015-01-23 DIAGNOSIS — Z7901 Long term (current) use of anticoagulants: Secondary | ICD-10-CM | POA: Diagnosis not present

## 2015-01-23 DIAGNOSIS — N179 Acute kidney failure, unspecified: Secondary | ICD-10-CM | POA: Diagnosis not present

## 2015-01-23 DIAGNOSIS — I480 Paroxysmal atrial fibrillation: Secondary | ICD-10-CM | POA: Diagnosis not present

## 2015-01-23 DIAGNOSIS — R310 Gross hematuria: Secondary | ICD-10-CM | POA: Diagnosis not present

## 2015-01-24 ENCOUNTER — Ambulatory Visit (HOSPITAL_COMMUNITY)
Admit: 2015-01-24 | Discharge: 2015-01-24 | Disposition: A | Discharge status: Home / Self Care | Payer: Medicare Other | Source: Ambulatory Visit | Attending: Nurse Practitioner | Admitting: Nurse Practitioner

## 2015-01-24 ENCOUNTER — Encounter (HOSPITAL_COMMUNITY): Payer: Self-pay | Admitting: Nurse Practitioner

## 2015-01-24 VITALS — BP 110/78 | HR 68 | Ht 71.0 in | Wt 195.8 lb

## 2015-01-24 DIAGNOSIS — I4891 Unspecified atrial fibrillation: Secondary | ICD-10-CM | POA: Insufficient documentation

## 2015-01-24 DIAGNOSIS — I480 Paroxysmal atrial fibrillation: Secondary | ICD-10-CM | POA: Diagnosis not present

## 2015-01-24 NOTE — Progress Notes (Signed)
Patient ID: Seth [name], male   DOB: 1925/06/05, 81 y.o.   MRN: [id number]    Primary Care Physician: Dr. Duane Lope  Seth [name] is a 81 y.o. male with a h/o recent hospitalization for sepsis secondary to UTI, with Afib with RVR. He converted to SR and was started on Eliquis, at first on 2.5 mg bid due to creatinine of 2.05. Chadsvasc score of at least 5. However renal status improved to 1.28 and dose was increased to  bid.  Today, he reports no apparent episodes of Afib. Ekg shows SR at 68 bpm. Saw his PCP yesterday for dark urine and was found to have  large amt blood in urine and the dose of apixaban was decreased to 2.5 mg bid. Urine color improved today. Hypotension was present in the hospital,but BP stable today at 110 systolic. He currently does not drink alcohol, smoke or sound like he has significant snoring issues. Here with daughter-in-law today.  He currently denies symptoms of palpitations, chest pain, shortness of breath, orthopnea, PND, lower extremity edema, dizziness, presyncope, syncope, or neurologic sequela. The patient is tolerating medications without difficulties, other than stated above and is otherwise without complaint today.   Past Medical History  Diagnosis Date  . Hypertension   . Hyperlipidemia   . Aortic stenosis    History reviewed. No pertinent past surgical history.  Current Outpatient Prescriptions  Medication Sig Dispense Refill  . apixaban (ELIQUIS) 2.5 MG TABS tablet Take 2.5 mg by mouth 2 (two) times daily.    . cefUROXime (CEFTIN) 500 MG tablet Take 1 tablet (500 mg total) by mouth 2 (two) times daily with a meal. 24 tablet 0  . CINNAMON PO Take 1 capsule by mouth daily.    . metoprolol succinate (TOPROL-XL) 50 MG 24 hr tablet Take 50 mg by mouth daily. Take with or immediately following a meal.    . Omega-3 Fatty Acids (FISH OIL PO) Take 1 capsule by mouth daily.    . simvastatin (ZOCOR) 10 MG tablet Take 10 mg by mouth at bedtime.      No current facility-administered medications for this encounter.    No Known Allergies  History   Social History  . Marital Status: Married    Spouse Name: N/A  . Number of Children: N/A  . Years of Education: N/A   Occupational History  . Not on file.   Social History Main Topics  . Smoking status: Former Games developer  . Smokeless tobacco: Never Used  . Alcohol Use: Yes  . Drug Use: No  . Sexual Activity: Not on file   Other Topics Concern  . Not on file   Social History Narrative    History reviewed. No pertinent family history.  ROS- All systems are reviewed and negative except as per the HPI above  Physical Exam: Filed Vitals:   01/24/15 1137  BP: 110/78  Pulse: 68  Height:  (1.803 m)  Weight: 195 lb 12.8 oz (88.814 kg)    GEN- The patient is well appearing, alert and oriented x 3 today.   Head- normocephalic, atraumatic Eyes-  Sclera clear, conjunctiva pink Ears- hearing intact Oropharynx- clear Neck- supple, no JVP Lymph- no cervical lymphadenopathy Lungs- Clear to ausculation bilaterally, normal work of breathing Heart- Regular rate and rhythm, no murmurs, rubs or gallops, PMI not laterally displaced GI- soft, NT, ND, + BS Extremities- no clubbing, cyanosis,  minimal pedal edema,chronic MS- no significant deformity or atrophy Skin- no  rash or lesion Psych- euthymic mood, full affect Neuro- strength and sensation are intact  EKG-NSR with 1st degree av block.Anteroseptal infarct, age undetermined. Epic records reviewed. Labs from [facility], 01/23/15, show creatine at 1.34, BUN 23. Kt 5.0, Hgb 12.6, HCT 37.8  Urinalysis remarkable for protein at 100 mg, and large amount of blood.  Assessment and Plan:  1. New onset Afib with RVR Maintaining SR Continue Metoprolol  2. Chadsvasc score of alt least 5 Dose  Of Eliquis decreased to 2.5 mg bid yesterday by PCP for hematuria Less visible blood in urine today per pt. F/u with PCP as  scheduled in 2 weeks. Stop fish oil  F/u with afib clinic in 3 months, sooner if needed

## 2015-01-24 NOTE — Patient Instructions (Signed)
Follow up with primary as scheduled in 2 weeks

## 2015-01-25 NOTE — Addendum Note (Signed)
Encounter addended by: Wandalee FerdinandKimberly Logan on: 01/25/2015  8:46 AM<BR>     Documentation filed: Charges VN

## 2015-01-29 DIAGNOSIS — R6521 Severe sepsis with septic shock: Secondary | ICD-10-CM | POA: Diagnosis not present

## 2015-01-29 DIAGNOSIS — E785 Hyperlipidemia, unspecified: Secondary | ICD-10-CM | POA: Diagnosis not present

## 2015-01-29 DIAGNOSIS — I4891 Unspecified atrial fibrillation: Secondary | ICD-10-CM | POA: Diagnosis not present

## 2015-01-29 DIAGNOSIS — N390 Urinary tract infection, site not specified: Secondary | ICD-10-CM | POA: Diagnosis not present

## 2015-01-29 DIAGNOSIS — R5381 Other malaise: Secondary | ICD-10-CM | POA: Diagnosis not present

## 2015-01-29 DIAGNOSIS — B9620 Unspecified Escherichia coli [E. coli] as the cause of diseases classified elsewhere: Secondary | ICD-10-CM | POA: Diagnosis not present

## 2015-01-31 DIAGNOSIS — R6521 Severe sepsis with septic shock: Secondary | ICD-10-CM | POA: Diagnosis not present

## 2015-01-31 DIAGNOSIS — E785 Hyperlipidemia, unspecified: Secondary | ICD-10-CM | POA: Diagnosis not present

## 2015-01-31 DIAGNOSIS — R5381 Other malaise: Secondary | ICD-10-CM | POA: Diagnosis not present

## 2015-01-31 DIAGNOSIS — N390 Urinary tract infection, site not specified: Secondary | ICD-10-CM | POA: Diagnosis not present

## 2015-01-31 DIAGNOSIS — B9620 Unspecified Escherichia coli [E. coli] as the cause of diseases classified elsewhere: Secondary | ICD-10-CM | POA: Diagnosis not present

## 2015-01-31 DIAGNOSIS — I4891 Unspecified atrial fibrillation: Secondary | ICD-10-CM | POA: Diagnosis not present

## 2015-02-04 DIAGNOSIS — N390 Urinary tract infection, site not specified: Secondary | ICD-10-CM | POA: Diagnosis not present

## 2015-02-04 DIAGNOSIS — E785 Hyperlipidemia, unspecified: Secondary | ICD-10-CM | POA: Diagnosis not present

## 2015-02-04 DIAGNOSIS — B9620 Unspecified Escherichia coli [E. coli] as the cause of diseases classified elsewhere: Secondary | ICD-10-CM | POA: Diagnosis not present

## 2015-02-04 DIAGNOSIS — R5381 Other malaise: Secondary | ICD-10-CM | POA: Diagnosis not present

## 2015-02-04 DIAGNOSIS — I4891 Unspecified atrial fibrillation: Secondary | ICD-10-CM | POA: Diagnosis not present

## 2015-02-04 DIAGNOSIS — R6521 Severe sepsis with septic shock: Secondary | ICD-10-CM | POA: Diagnosis not present

## 2015-02-06 DIAGNOSIS — E785 Hyperlipidemia, unspecified: Secondary | ICD-10-CM | POA: Diagnosis not present

## 2015-02-06 DIAGNOSIS — I4891 Unspecified atrial fibrillation: Secondary | ICD-10-CM | POA: Diagnosis not present

## 2015-02-06 DIAGNOSIS — R6521 Severe sepsis with septic shock: Secondary | ICD-10-CM | POA: Diagnosis not present

## 2015-02-06 DIAGNOSIS — B9620 Unspecified Escherichia coli [E. coli] as the cause of diseases classified elsewhere: Secondary | ICD-10-CM | POA: Diagnosis not present

## 2015-02-06 DIAGNOSIS — N390 Urinary tract infection, site not specified: Secondary | ICD-10-CM | POA: Diagnosis not present

## 2015-02-06 DIAGNOSIS — R5381 Other malaise: Secondary | ICD-10-CM | POA: Diagnosis not present

## 2015-02-08 DIAGNOSIS — Z87440 Personal history of urinary (tract) infections: Secondary | ICD-10-CM | POA: Diagnosis not present

## 2015-02-11 DIAGNOSIS — B9620 Unspecified Escherichia coli [E. coli] as the cause of diseases classified elsewhere: Secondary | ICD-10-CM | POA: Diagnosis not present

## 2015-02-11 DIAGNOSIS — I4891 Unspecified atrial fibrillation: Secondary | ICD-10-CM | POA: Diagnosis not present

## 2015-02-11 DIAGNOSIS — R5381 Other malaise: Secondary | ICD-10-CM | POA: Diagnosis not present

## 2015-02-11 DIAGNOSIS — N390 Urinary tract infection, site not specified: Secondary | ICD-10-CM | POA: Diagnosis not present

## 2015-02-11 DIAGNOSIS — E785 Hyperlipidemia, unspecified: Secondary | ICD-10-CM | POA: Diagnosis not present

## 2015-02-11 DIAGNOSIS — R6521 Severe sepsis with septic shock: Secondary | ICD-10-CM | POA: Diagnosis not present

## 2015-03-05 DIAGNOSIS — R55 Syncope and collapse: Secondary | ICD-10-CM | POA: Diagnosis not present

## 2015-03-05 DIAGNOSIS — I480 Paroxysmal atrial fibrillation: Secondary | ICD-10-CM | POA: Diagnosis not present

## 2015-03-29 DIAGNOSIS — M79675 Pain in left toe(s): Secondary | ICD-10-CM | POA: Diagnosis not present

## 2015-03-29 DIAGNOSIS — M79674 Pain in right toe(s): Secondary | ICD-10-CM | POA: Diagnosis not present

## 2015-03-29 DIAGNOSIS — B351 Tinea unguium: Secondary | ICD-10-CM | POA: Diagnosis not present

## 2015-03-31 ENCOUNTER — Emergency Department (HOSPITAL_COMMUNITY): Payer: Medicare Other

## 2015-03-31 ENCOUNTER — Encounter (HOSPITAL_COMMUNITY): Payer: Self-pay

## 2015-03-31 ENCOUNTER — Inpatient Hospital Stay (HOSPITAL_COMMUNITY)
Admission: EM | Admit: 2015-03-31 | Discharge: 2015-04-02 | DRG: 922 | Disposition: A | Discharge status: Home / Self Care | Payer: Medicare Other | Attending: Internal Medicine | Admitting: Internal Medicine

## 2015-03-31 DIAGNOSIS — Z7901 Long term (current) use of anticoagulants: Secondary | ICD-10-CM | POA: Diagnosis not present

## 2015-03-31 DIAGNOSIS — I129 Hypertensive chronic kidney disease with stage 1 through stage 4 chronic kidney disease, or unspecified chronic kidney disease: Secondary | ICD-10-CM | POA: Diagnosis present

## 2015-03-31 DIAGNOSIS — S069X9A Unspecified intracranial injury with loss of consciousness of unspecified duration, initial encounter: Secondary | ICD-10-CM | POA: Diagnosis not present

## 2015-03-31 DIAGNOSIS — S199XXA Unspecified injury of neck, initial encounter: Secondary | ICD-10-CM | POA: Diagnosis not present

## 2015-03-31 DIAGNOSIS — I4891 Unspecified atrial fibrillation: Secondary | ICD-10-CM | POA: Diagnosis not present

## 2015-03-31 DIAGNOSIS — S0101XA Laceration without foreign body of scalp, initial encounter: Secondary | ICD-10-CM | POA: Diagnosis present

## 2015-03-31 DIAGNOSIS — Z8249 Family history of ischemic heart disease and other diseases of the circulatory system: Secondary | ICD-10-CM

## 2015-03-31 DIAGNOSIS — S098XXA Other specified injuries of head, initial encounter: Secondary | ICD-10-CM | POA: Diagnosis not present

## 2015-03-31 DIAGNOSIS — Z79899 Other long term (current) drug therapy: Secondary | ICD-10-CM | POA: Diagnosis not present

## 2015-03-31 DIAGNOSIS — T1490XA Injury, unspecified, initial encounter: Secondary | ICD-10-CM

## 2015-03-31 DIAGNOSIS — I482 Chronic atrial fibrillation: Secondary | ICD-10-CM | POA: Diagnosis not present

## 2015-03-31 DIAGNOSIS — N179 Acute kidney failure, unspecified: Secondary | ICD-10-CM | POA: Diagnosis not present

## 2015-03-31 DIAGNOSIS — R55 Syncope and collapse: Secondary | ICD-10-CM | POA: Diagnosis not present

## 2015-03-31 DIAGNOSIS — Z23 Encounter for immunization: Secondary | ICD-10-CM

## 2015-03-31 DIAGNOSIS — N183 Chronic kidney disease, stage 3 unspecified: Secondary | ICD-10-CM | POA: Diagnosis present

## 2015-03-31 DIAGNOSIS — T671XXA Heat syncope, initial encounter: Principal | ICD-10-CM | POA: Diagnosis present

## 2015-03-31 DIAGNOSIS — N184 Chronic kidney disease, stage 4 (severe): Secondary | ICD-10-CM | POA: Diagnosis not present

## 2015-03-31 DIAGNOSIS — Z87891 Personal history of nicotine dependence: Secondary | ICD-10-CM | POA: Diagnosis not present

## 2015-03-31 DIAGNOSIS — S060X1A Concussion with loss of consciousness of 30 minutes or less, initial encounter: Secondary | ICD-10-CM | POA: Diagnosis not present

## 2015-03-31 DIAGNOSIS — I350 Nonrheumatic aortic (valve) stenosis: Secondary | ICD-10-CM | POA: Diagnosis present

## 2015-03-31 DIAGNOSIS — I10 Essential (primary) hypertension: Secondary | ICD-10-CM | POA: Diagnosis present

## 2015-03-31 DIAGNOSIS — S06359A Traumatic hemorrhage of left cerebrum with loss of consciousness of unspecified duration, initial encounter: Secondary | ICD-10-CM | POA: Diagnosis present

## 2015-03-31 DIAGNOSIS — W19XXXA Unspecified fall, initial encounter: Secondary | ICD-10-CM | POA: Diagnosis present

## 2015-03-31 DIAGNOSIS — T671XXD Heat syncope, subsequent encounter: Secondary | ICD-10-CM | POA: Diagnosis not present

## 2015-03-31 DIAGNOSIS — S069X1A Unspecified intracranial injury with loss of consciousness of 30 minutes or less, initial encounter: Secondary | ICD-10-CM

## 2015-03-31 DIAGNOSIS — E785 Hyperlipidemia, unspecified: Secondary | ICD-10-CM | POA: Diagnosis present

## 2015-03-31 DIAGNOSIS — M542 Cervicalgia: Secondary | ICD-10-CM | POA: Diagnosis not present

## 2015-03-31 DIAGNOSIS — S0990XA Unspecified injury of head, initial encounter: Secondary | ICD-10-CM | POA: Diagnosis not present

## 2015-03-31 HISTORY — DX: Chronic kidney disease, stage 3 unspecified: N18.30

## 2015-03-31 HISTORY — DX: Unspecified atrial fibrillation: I48.91

## 2015-03-31 LAB — CBC
HEMATOCRIT: 41.1 % (ref 39.0–52.0)
Hemoglobin: 14.1 g/dL (ref 13.0–17.0)
MCH: 30.9 pg (ref 26.0–34.0)
MCHC: 34.3 g/dL (ref 30.0–36.0)
MCV: 89.9 fL (ref 78.0–100.0)
Platelets: 277 10*3/uL (ref 150–400)
RBC: 4.57 MIL/uL (ref 4.22–5.81)
RDW: 14.5 % (ref 11.5–15.5)
WBC: 9.1 10*3/uL (ref 4.0–10.5)

## 2015-03-31 LAB — BASIC METABOLIC PANEL
ANION GAP: 9 (ref 5–15)
BUN: 21 mg/dL — AB (ref 6–20)
CHLORIDE: 102 mmol/L (ref 101–111)
CO2: 27 mmol/L (ref 22–32)
CREATININE: 1.21 mg/dL (ref 0.61–1.24)
Calcium: 9.1 mg/dL (ref 8.9–10.3)
GFR calc Af Amer: >60 mL/min (ref >60)
GFR calc non Af Amer: 52 mL/min — ABNORMAL LOW (ref >60)
Glucose, Bld: 121 mg/dL — ABNORMAL HIGH (ref 65–99)
Potassium: 4.3 mmol/L (ref 3.5–5.1)
Sodium: 138 mmol/L (ref 135–145)

## 2015-03-31 LAB — CBC WITH DIFFERENTIAL/PLATELET
Basophils Absolute: 0 10*3/uL (ref 0.0–0.1)
Basophils Relative: 0 % (ref 0–1)
EOS PCT: 2 % (ref 0–5)
Eosinophils Absolute: 0.3 10*3/uL (ref 0.0–0.7)
HCT: 40.8 % (ref 39.0–52.0)
Hemoglobin: 13.4 g/dL (ref 13.0–17.0)
LYMPHS PCT: 16 % (ref 12–46)
Lymphs Abs: 1.7 10*3/uL (ref 0.7–4.0)
MCH: 29.8 pg (ref 26.0–34.0)
MCHC: 32.8 g/dL (ref 30.0–36.0)
MCV: 90.9 fL (ref 78.0–100.0)
MONO ABS: 1 10*3/uL (ref 0.1–1.0)
MONOS PCT: 10 % (ref 3–12)
Neutro Abs: 7.5 10*3/uL (ref 1.7–7.7)
Neutrophils Relative %: 72 % (ref 43–77)
PLATELETS: 306 10*3/uL (ref 150–400)
RBC: 4.49 MIL/uL (ref 4.22–5.81)
RDW: 14.5 % (ref 11.5–15.5)
WBC: 10.4 10*3/uL (ref 4.0–10.5)

## 2015-03-31 LAB — PROTIME-INR
INR: 1.1 (ref 0.00–1.49)
INR: 1.14 (ref 0.00–1.49)
Prothrombin Time: 14.4 seconds (ref 11.6–15.2)
Prothrombin Time: 14.8 seconds (ref 11.6–15.2)

## 2015-03-31 LAB — COMPREHENSIVE METABOLIC PANEL
ALK PHOS: 84 U/L (ref 38–126)
ALT: 17 U/L (ref 17–63)
AST: 25 U/L (ref 15–41)
Albumin: 3.5 g/dL (ref 3.5–5.0)
Anion gap: 8 (ref 5–15)
BILIRUBIN TOTAL: 0.9 mg/dL (ref 0.3–1.2)
BUN: 19 mg/dL (ref 6–20)
CO2: 28 mmol/L (ref 22–32)
Calcium: 8.9 mg/dL (ref 8.9–10.3)
Chloride: 101 mmol/L (ref 101–111)
Creatinine, Ser: 1.25 mg/dL — ABNORMAL HIGH (ref 0.61–1.24)
GFR calc Af Amer: 58 mL/min — ABNORMAL LOW (ref >60)
GFR, EST NON AFRICAN AMERICAN: 50 mL/min — AB (ref >60)
GLUCOSE: 202 mg/dL — AB (ref 65–99)
Potassium: 4.2 mmol/L (ref 3.5–5.1)
Sodium: 137 mmol/L (ref 135–145)
TOTAL PROTEIN: 6.5 g/dL (ref 6.5–8.1)

## 2015-03-31 LAB — I-STAT CHEM 8, ED
BUN: 24 mg/dL — AB (ref 6–20)
CHLORIDE: 102 mmol/L (ref 101–111)
Calcium, Ion: 1.17 mmol/L (ref 1.13–1.30)
Creatinine, Ser: 1.3 mg/dL — ABNORMAL HIGH (ref 0.61–1.24)
GLUCOSE: 120 mg/dL — AB (ref 65–99)
HCT: 42 % (ref 39.0–52.0)
HEMOGLOBIN: 14.3 g/dL (ref 13.0–17.0)
Potassium: 4.3 mmol/L (ref 3.5–5.1)
SODIUM: 140 mmol/L (ref 135–145)
TCO2: 23 mmol/L (ref 0–100)

## 2015-03-31 LAB — URINALYSIS, ROUTINE W REFLEX MICROSCOPIC
Bilirubin Urine: NEGATIVE
Glucose, UA: NEGATIVE mg/dL
Hgb urine dipstick: NEGATIVE
Ketones, ur: NEGATIVE mg/dL
Leukocytes, UA: NEGATIVE
Nitrite: NEGATIVE
Protein, ur: NEGATIVE mg/dL
Specific Gravity, Urine: 1.018 (ref 1.005–1.030)
Urobilinogen, UA: 0.2 mg/dL (ref 0.0–1.0)
pH: 6.5 (ref 5.0–8.0)

## 2015-03-31 LAB — TROPONIN I: Troponin I: <0.03 ng/mL (ref <0.031)

## 2015-03-31 LAB — MAGNESIUM: Magnesium: 1.9 mg/dL (ref 1.7–2.4)

## 2015-03-31 LAB — TSH: TSH: 0.608 u[IU]/mL (ref 0.350–4.500)

## 2015-03-31 LAB — APTT: APTT: 28 s (ref 24–37)

## 2015-03-31 LAB — CBG MONITORING, ED: Glucose-Capillary: 113 mg/dL — ABNORMAL HIGH (ref 65–99)

## 2015-03-31 LAB — PHOSPHORUS: Phosphorus: 3.6 mg/dL (ref 2.5–4.6)

## 2015-03-31 MED ORDER — SODIUM CHLORIDE 0.9 % IJ SOLN
3.0000 mL | Freq: Two times a day (BID) | INTRAMUSCULAR | Status: DC
  Administered 2015-03-31 – 2015-04-02 (×4): 3 mL via INTRAVENOUS

## 2015-03-31 MED ORDER — TETANUS-DIPHTH-ACELL PERTUSSIS 5-2.5-18.5 LF-MCG/0.5 IM SUSP
0.5000 mL | Freq: Once | INTRAMUSCULAR | Status: AC
  Administered 2015-03-31: 0.5 mL via INTRAMUSCULAR
  Filled 2015-03-31: qty 0.5

## 2015-03-31 MED ORDER — ACETAMINOPHEN 650 MG RE SUPP: 650.0000 mg | Freq: Four times a day (QID) | RECTAL | Status: DC | PRN

## 2015-03-31 MED ORDER — ONDANSETRON HCL 4 MG/2ML IJ SOLN: 4.0000 mg | Freq: Four times a day (QID) | INTRAMUSCULAR | Status: DC | PRN

## 2015-03-31 MED ORDER — ACETAMINOPHEN 325 MG PO TABS: 650.0000 mg | ORAL_TABLET | Freq: Four times a day (QID) | ORAL | Status: DC | PRN

## 2015-03-31 MED ORDER — APIXABAN 2.5 MG PO TABS
2.5000 mg | ORAL_TABLET | Freq: Two times a day (BID) | ORAL | Status: DC
  Administered 2015-03-31 – 2015-04-02 (×4): 2.5 mg via ORAL
  Filled 2015-03-31 (×5): qty 1

## 2015-03-31 MED ORDER — SODIUM CHLORIDE 0.9 % IV SOLN
INTRAVENOUS | Status: DC
  Administered 2015-03-31: 17:00:00 via INTRAVENOUS

## 2015-03-31 MED ORDER — METOPROLOL SUCCINATE ER 50 MG PO TB24
50.0000 mg | ORAL_TABLET | Freq: Every day | ORAL | Status: DC
  Administered 2015-04-01 – 2015-04-02 (×2): 50 mg via ORAL
  Filled 2015-03-31 (×2): qty 1

## 2015-03-31 MED ORDER — SIMVASTATIN 10 MG PO TABS
10.0000 mg | ORAL_TABLET | Freq: Every day | ORAL | Status: DC
  Administered 2015-03-31 – 2015-04-01 (×2): 10 mg via ORAL
  Filled 2015-03-31 (×3): qty 1

## 2015-03-31 MED ORDER — ONDANSETRON HCL 4 MG PO TABS: 4.0000 mg | ORAL_TABLET | Freq: Four times a day (QID) | ORAL | Status: DC | PRN

## 2015-03-31 MED ORDER — LISINOPRIL 20 MG PO TABS
20.0000 mg | ORAL_TABLET | Freq: Every day | ORAL | Status: DC
  Administered 2015-04-01 – 2015-04-02 (×2): 20 mg via ORAL
  Filled 2015-03-31 (×2): qty 1

## 2015-03-31 NOTE — ED Notes (Signed)
[facility]- Pt coming from home after unwitnessed fall. Pt called EMS using life alert bracelet. Pt found to be lying on back with head on concrete. Lac/abrasion noted to the back of the left side of the head. Pt does not remember falling or pushing life alert bracelet. Pt in c-collar on arrival. Denies pain. Pt does take Eloquis at home.

## 2015-03-31 NOTE — Progress Notes (Signed)
Pt arrived to the unit family at the bedside, oriented to fall risk precautions and yellow socks placed on the end of bed. Pt and family verbalized understanding explained MD orders and current plan of care. SCD's placed and IV fluids started.

## 2015-03-31 NOTE — Progress Notes (Signed)
I was asked to review the head CT on this 81 yo man who fell after a syncopal episode and has a tiny intraventricular hyperdensity c/w traumatic blood. He is neurologically normal, no headache, but is on eloquis. I do not think the blood is clinically significant and should not worsen given its location. Would not repeat scan unless there is some change in MS, and would continue blood thinner as risk of reversing likely outweighs risk of continuing.

## 2015-03-31 NOTE — ED Notes (Signed)
Called service response and diet tray will be sent to [facility] room 3.

## 2015-03-31 NOTE — ED Provider Notes (Signed)
CSN: [id number]     Arrival date & time 03/31/15  1201 History   First MD Initiated Contact with Patient 03/31/15 1206     Chief Complaint  Patient presents with  . Fall  . Loss of Consciousness     (Consider location/radiation/quality/duration/timing/severity/associated sxs/prior Treatment) Patient is a 81 y.o. male presenting with fall and syncope. The history is provided by the patient, a relative and the EMS personnel.  Fall Pertinent negatives include no chest pain, no abdominal pain, no headaches and no shortness of breath.  Loss of Consciousness Associated symptoms: no chest pain, no confusion, no fever, no headaches, no nausea, no shortness of breath, no vomiting and no weakness   Patient s/p fall at home, just pta today. Pt states was walking back to home from his shop, and the next thing he remembers he had fallen and was on ground. Denies faintness or dizziness prior to fall. Pt is unsure what caused fall, thinks he may have passed out.  Walks w walking stick. No other recent falls. Hit head. Contusion/lac to posterior scalp. Tetanus unknown. Denies neck or back pain. No numbness/weakness. Denies headache. No chest pain or discomfort. No palpitations or sense of rapid or irregular heart beat.  Hx afib, is on eliquis. No recent abn bleeding or bruising. Pt denies other pain or injury. States recent health had been at baseline since hospital d/c a few months ago.      Past Medical History  Diagnosis Date  . Hypertension   . Hyperlipidemia   . Aortic stenosis    History reviewed. No pertinent past surgical history. History reviewed. No pertinent family history. History  Substance Use Topics  . Smoking status: Former Games developer  . Smokeless tobacco: Never Used  . Alcohol Use: No    Review of Systems  Constitutional: Negative for fever and chills.  HENT: Negative for sore throat.   Eyes: Negative for pain and visual disturbance.  Respiratory: Negative for cough and shortness  of breath.   Cardiovascular: Positive for syncope. Negative for chest pain.  Gastrointestinal: Negative for nausea, vomiting and abdominal pain.  Endocrine: Negative for polyuria.  Genitourinary: Negative for dysuria and flank pain.  Musculoskeletal: Negative for back pain and neck pain.  Skin: Positive for wound.  Neurological: Negative for weakness, numbness and headaches.  Hematological: Does not bruise/bleed easily.  Psychiatric/Behavioral: Negative for confusion.      Allergies  Review of patient's allergies indicates no known allergies.  Home Medications   Prior to Admission medications   Medication Sig Start Date End Date Taking? Authorizing Provider  apixaban (ELIQUIS) 2.5 MG TABS tablet Take 2.5 mg by mouth 2 (two) times daily.    Historical Provider, MD  cefUROXime (CEFTIN) 500 MG tablet Take 1 tablet (500 mg total) by mouth 2 (two) times daily with a meal. 01/21/15   Jeralyn Bennett, MD  CINNAMON PO Take 1 capsule by mouth daily.    Historical Provider, MD  metoprolol succinate (TOPROL-XL) 50 MG 24 hr tablet Take 50 mg by mouth daily. Take with or immediately following a meal.    Historical Provider, MD  Omega-3 Fatty Acids (FISH OIL PO) Take 1 capsule by mouth daily.    Historical Provider, MD  simvastatin (ZOCOR) 10 MG tablet Take 10 mg by mouth at bedtime.    Historical Provider, MD   BP 144/72 mmHg  Pulse 73  Temp(Src) 98 F (36.7 C) (Oral)  Resp 21  SpO2 95% Physical Exam  Constitutional: He is oriented  to person, place, and time. He appears well-developed and well-nourished. No distress.  HENT:  Contusion/lac left posterior scalp.   Eyes: Conjunctivae and EOM are normal. Pupils are equal, round, and reactive to light.  Neck: Neck supple. No tracheal deviation present.  Cardiovascular: Normal rate and intact distal pulses.  Exam reveals no gallop and no friction rub.   Murmur heard. Pulmonary/Chest: Effort normal and breath sounds normal. No accessory muscle  usage. No respiratory distress. He exhibits no tenderness.  Abdominal: Soft. Bowel sounds are normal. He exhibits no distension. There is no tenderness.  No pulsatile mass.   Genitourinary:  No cva or flank tenderness.   Musculoskeletal: Normal range of motion. He exhibits no edema or tenderness.  Mild mid cervical tenderness, otherwise, CTLS spine, non tender, aligned, no step off. Good rom bil ext without pain or focal bony tenderness. Distal pulses palp bil.   Neurological: He is alert and oriented to person, place, and time.  Motor intact bil, stre 5/5. sens grossly intact.   Skin: Skin is warm and dry. He is not diaphoretic.  Psychiatric: He has a normal mood and affect.  Nursing note and vitals reviewed.   ED Course  Procedures (including critical care time) Labs Review  Results for orders placed or performed during the hospital encounter of 03/31/15  CBC  Result Value Ref Range   WBC 9.1 4.0 - 10.5 K/uL   RBC 4.57 4.22 - 5.81 MIL/uL   Hemoglobin 14.1 13.0 - 17.0 g/dL   HCT 29.5 62.1 - 30.8 %   MCV 89.9 78.0 - 100.0 fL   MCH 30.9 26.0 - 34.0 pg   MCHC 34.3 30.0 - 36.0 g/dL   RDW 65.7 84.6 - 96.2 %   Platelets 277 150 - 400 K/uL  CBG, ED  Result Value Ref Range   Glucose-Capillary 113 (H) 65 - 99 mg/dL  I-Stat Chem 8, ED  (not at [facility])  Result Value Ref Range   Sodium 140 135 - 145 mmol/L   Potassium 4.3 3.5 - 5.1 mmol/L   Chloride 102 101 - 111 mmol/L   BUN 24 (H) 6 - 20 mg/dL   Creatinine, Ser 9.52 (H) 0.61 - 1.24 mg/dL   Glucose, Bld 841 (H) 65 - 99 mg/dL   Calcium, Ion 3.24 4.01 - 1.30 mmol/L   TCO2 23 0 - 100 mmol/L   Hemoglobin 14.3 13.0 - 17.0 g/dL   HCT 02.7 25.3 - 66.4 %   Ct Head Wo Contrast  03/31/2015   CLINICAL DATA:  Unwitnessed fall with loss of consciousness. Struck the back of the head on concrete.  EXAM: CT HEAD WITHOUT CONTRAST  CT CERVICAL SPINE WITHOUT CONTRAST  TECHNIQUE: Multidetector CT imaging of the head and cervical spine was  performed following the standard protocol without intravenous contrast. Multiplanar CT image reconstructions of the cervical spine were also generated.  COMPARISON:  MRI 04/14/2010  FINDINGS: CT HEAD FINDINGS  There is a left posterior parietal scalp hematoma without evidence of underlying skull fracture.  There chronic small-vessel ischemic changes affecting the brainstem. 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 The cerebral hemispheres show generalized atrophy and chronic small-vessel disease of the white matter. I do not see diffuse subarachnoid blood, but there is a small amount of blood layering dependently in the occipital horn of the left lateral ventricle. The ventricles are large, but in proportion to the degree of atrophy. No evidence of mass lesion. No intraparenchymal hemorrhage. No fluid in the sinuses.  CT CERVICAL SPINE FINDINGS  Alignment is normal. No fracture. No soft tissue swelling. There is chronic fusion at the C3-4 level and at the C5-6 level. There is degenerative spondylosis at C6-7 with mild osteophytic encroachment upon the canal and foramina. Large left-sided thyroid goiter with intrathoracic extension.  IMPRESSION: Head CT: Small amount of hemorrhage layering dependently in the occipital horn of the left lateral ventricle. No evidence of more extensive subarachnoid hemorrhage. Presumably this is a small amount of traumatic hemorrhage. Atrophy and chronic small-vessel disease elsewhere. Left parietal scalp hematoma without underlying skull fracture.  Cervical spine CT: No acute or traumatic finding. Chronic degenerative changes. Left-sided thyroid goiter.  Critical Value/emergent results were called by telephone at the time of interpretation on 03/31/2015 at 12:54 pm to Dr. Cathren Laine , who verbally acknowledged these results.   Electronically Signed   By: Paulina Fusi M.D.   On: 03/31/2015 12:55   Ct Cervical Spine Wo Contrast  03/31/2015   CLINICAL DATA:  Unwitnessed fall  with loss of consciousness. Struck the back of the head on concrete.  EXAM: CT HEAD WITHOUT CONTRAST  CT CERVICAL SPINE WITHOUT CONTRAST  TECHNIQUE: Multidetector CT imaging of the head and cervical spine was performed following the standard protocol without intravenous contrast. Multiplanar CT image reconstructions of the cervical spine were also generated.  COMPARISON:  MRI 04/14/2010  FINDINGS: CT HEAD FINDINGS  There is a left posterior parietal scalp hematoma without evidence of underlying skull fracture.  There chronic small-vessel ischemic changes affecting the brainstem. 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 The cerebral hemispheres show generalized atrophy and chronic small-vessel disease of the white matter. I do not see diffuse subarachnoid blood, but there is a small amount of blood layering dependently in the occipital horn of the left lateral ventricle. The ventricles are large, but in proportion to the degree of atrophy. No evidence of mass lesion. No intraparenchymal hemorrhage. No fluid in the sinuses.  CT CERVICAL SPINE FINDINGS  Alignment is normal. No fracture. No soft tissue swelling. There is chronic fusion at the C3-4 level and at the C5-6 level. There is degenerative spondylosis at C6-7 with mild osteophytic encroachment upon the canal and foramina. Large left-sided thyroid goiter with intrathoracic extension.  IMPRESSION: Head CT: Small amount of hemorrhage layering dependently in the occipital horn of the left lateral ventricle. No evidence of more extensive subarachnoid hemorrhage. Presumably this is a small amount of traumatic hemorrhage. Atrophy and chronic small-vessel disease elsewhere. Left parietal scalp hematoma without underlying skull fracture.  Cervical spine CT: No acute or traumatic finding. Chronic degenerative changes. Left-sided thyroid goiter.  Critical Value/emergent results were called by telephone at the time of interpretation on 03/31/2015 at 12:54 pm to Dr.  Cathren Laine , who verbally acknowledged these results.   Electronically Signed   By: Paulina Fusi M.D.   On: 03/31/2015 12:55       EKG Interpretation   Date/Time:  Sunday March 31 2015 12:01:56 EDT Ventricular Rate:  73 PR Interval:    QRS Duration: 102 QT Interval:  401 QTC Calculation: 442 R Axis:   72 Text Interpretation:  Atrial flutter with predominant 4:1 AV block  Non-specific intra-ventricular conduction delay Confirmed by Denton Lank  MD,  Caryn Bee ([id number]) on 03/31/2015 12:08:30 PM      MDM   S/p fall at home.  Pt unsure what caused fall, ?syncope.  Iv ns. Continuous pulse ox and monitor.   Labs. Stat ct.  Reviewed nursing notes and  prior charts for additional history.   Consulted neurosurgeon on call, Dr Yetta Barre - he reviewed head ct, and states no need to reverse eliquis, no specific interventional or tx needed as relates small amt blood in lat ventricle.  Given syncopal event, will admit to med service for monitoring.   Recheck spine nt.  No new c/o.      Cathren Laine, MD 03/31/15 1336

## 2015-03-31 NOTE — ED Notes (Signed)
Meal tray at bedside.  

## 2015-03-31 NOTE — H&P (Signed)
[facility] History and Physical  Seth [name] KGM:[id number] DOB: [birth year] DOA: 03/31/2015  Referring physician: ER physician: Dr. Cathren Laine PCP: Dr. Duane Lope  Chief Complaint: fall, syncope  HPI:  81 year old male with past medical history of hypertension, dyslipidemia, atrial fibrillation (on AC with Eliquis). Pt presented to [facility] ED status post fall, loss of consciousness just prior to the admission. He reported he remember walking back to the shop and home and next thing he was on the ground. He does not recall any prodromal events like lightheadedness, chest pain, shortness of breath, dizziness. No abdominal pain, nausea or vomiting. No urinary complaints. No fevers or chills. No blood in stool or urine.   In ED, pt is back to baseline, alert and oriented to time, place, person. His CT head showed small amount of hemorrhage layering dependently in the occipital horn of the left lateral ventricle. ED physician contacted neurosurgery and Dr. Yetta Barre on call said pt can continue Eliquis and no need to repeat the scan unless pt's neurologic status changes.    Assessment & Plan    Principal Problem:   Syncope / Fall - Possibly related to heat, exertion - He has sustained small amount of hemorrhage layering dependently in the occipital horn of the left lateral ventricle - Per Dr. Yetta Barre of neurosurgery pt can continue Eliquis and no need to repeat the scan unless pt's neurologic status changes. - Cycle cardiac enzymes, obtaan 2 D ECHO - Monitor on telemetry for next 24 hours   Active Problems:   Chronic kidney disease (CKD), stage IV (severe) - Baseline creatinine is 2.39 in 12/2014 - Creatinine on this admission is 1.3 - Continue to monitor renal function    HTN (hypertension) - Resume metoprolol and lisinopril     Atrial fibrillation, chronic - CHADS vasc score at least 3 - Anticoagulation with Eliquis - Rate controlled with metoprolol     Dyslipidemia - Resume  statin therapy    DVT prophylaxis:  - on full dose AC with Eliquis   Radiological Exams on Admission: Ct Head Wo Contrast 03/31/2015  Head CT: Small amount of hemorrhage layering dependently in the occipital horn of the left lateral ventricle. No evidence of more extensive subarachnoid hemorrhage. Presumably this is a small amount of traumatic hemorrhage. Atrophy and chronic small-vessel disease elsewhere. Left parietal scalp hematoma without underlying skull fracture.  Cervical spine CT: No acute or traumatic finding. Chronic degenerative changes. Left-sided thyroid goiter.  Critical Value/emergent results were called by telephone at the time of interpretation on 03/31/2015 at 12:54 pm to Dr. Cathren Laine , who verbally acknowledged these results.   Electronically Signed   By: Paulina Fusi M.D.   On: 03/31/2015 12:55   Ct Cervical Spine Wo Contrast 03/31/2015   Head CT: Small amount of hemorrhage layering dependently in the occipital horn of the left lateral ventricle. No evidence of more extensive subarachnoid hemorrhage. Presumably this is a small amount of traumatic hemorrhage. Atrophy and chronic small-vessel disease elsewhere. Left parietal scalp hematoma without underlying skull fracture.  Cervical spine CT: No acute or traumatic finding. Chronic degenerative changes. Left-sided thyroid goiter.  Critical Value/emergent results were called by telephone at the time of interpretation on 03/31/2015 at 12:54 pm to Dr. Cathren Laine , who verbally acknowledged these results.   Electronically Signed   By: Paulina Fusi M.D.   On: 03/31/2015 12:55    EKG: I have personally reviewed EKG. EKG shows atrial flutter   Code Status:  Full Family Communication: Plan of care discussed with the patient and his wife at the bedside  Disposition Plan: Admit for further evaluation, telemetry   Lilyann Gravelle, MD  [facility] Pager [phone]  Time spent in minutes: 75 minutes  Review of Systems:   Constitutional: Negative for fever, chills and malaise/fatigue. Negative for diaphoresis.  HENT: Negative for hearing loss, ear pain, nosebleeds, congestion, sore throat, neck pain, tinnitus and ear discharge.   Eyes: Negative for blurred vision, double vision, photophobia, pain, discharge and redness.  Respiratory: Negative for cough, hemoptysis, sputum production, shortness of breath, wheezing and stridor.   Cardiovascular: Negative for chest pain, palpitations, orthopnea, claudication and leg swelling.  Gastrointestinal: Negative for nausea, vomiting and abdominal pain. Negative for heartburn, constipation, blood in stool and melena.  Genitourinary: Negative for dysuria, urgency, frequency, hematuria and flank pain.  Musculoskeletal: Negative for myalgias, back pain, joint pain. Skin: Negative for itching and rash.  Neurological: per HPI Endo/Heme/Allergies: Negative for environmental allergies and polydipsia. Does not bruise/bleed easily.  Psychiatric/Behavioral: Negative for suicidal ideas. The patient is not nervous/anxious.      Past Medical History  Diagnosis Date  . Hypertension   . Hyperlipidemia   . Aortic stenosis    History reviewed. No pertinent past surgical history. Social History:  reports that he has quit smoking. He has never used smokeless tobacco. He reports that he does not drink alcohol or use illicit drugs.  No Known Allergies  Family History: hypertension in parents    Prior to Admission medications   Medication Sig Start Date End Date Taking? Authorizing Provider  acetaminophen (TYLENOL) 325 MG tablet Take 650 mg by mouth every 6 (six) hours as needed.   Yes Historical Provider, MD  apixaban (ELIQUIS) 2.5 MG TABS tablet Take 2.5 mg by mouth 2 (two) times daily.   Yes Historical Provider, MD  CINNAMON PO Take 1 capsule by mouth daily.   Yes Historical Provider, MD  lisinopril (PRINIVIL,ZESTRIL) 20 MG tablet Take 20 mg by mouth daily. 03/01/15  Yes Historical  Provider, MD  metoprolol succinate (TOPROL-XL) 50 MG 24 hr tablet Take 50 mg by mouth daily. Take with or immediately following a meal.   Yes Historical Provider, MD  simvastatin (ZOCOR) 10 MG tablet Take 10 mg by mouth at bedtime.   Yes Historical Provider, MD   Physical Exam: Filed Vitals:   03/31/15 1345 03/31/15 1400 03/31/15 1415 03/31/15 1430  BP: 132/73 140/82 143/73 125/70  Pulse: 77 77 79 80  Temp:      TempSrc:      Resp: SpO2: 96% 96% 97% 97%    Physical Exam  Constitutional: Appears well-developed and well-nourished. No distress.  HENT: Normocephalic. No tonsillar erythema or exudates Eyes: Conjunctivae and EOM are normal. PERRLA, no scleral icterus.  Neck: Normal ROM. Neck supple. No JVD. No tracheal deviation. No thyromegaly.  CVS: irregular rhythm, rate controlled, S1/S2 appreciated  Pulmonary: Effort and breath sounds normal, no stridor, rhonchi, wheezes, rales.  Abdominal: Soft. BS +,  no distension, tenderness, rebound or guarding.  Musculoskeletal: Normal range of motion. No edema and no tenderness.  Lymphadenopathy: No lymphadenopathy noted, cervical, inguinal. Neuro: Alert. Normal reflexes, muscle tone coordination. No focal neurologic deficits. Skin: Skin is warm and dry. No rash noted.  No erythema. No pallor.  Psychiatric: Normal mood and affect. Behavior, judgment, thought content normal.   Labs on Admission:  Basic Metabolic Panel:  Recent Labs Lab 03/31/15 1323 03/31/15 1328  NA 138 140  K 4.3 4.3  CL 102 102  CO2 27  --   GLUCOSE 121* 120*  BUN 21* 24*  CREATININE 1.21 1.30*  CALCIUM 9.1  --    Liver Function Tests: No results for input(s): AST, ALT, ALKPHOS, BILITOT, PROT, ALBUMIN in the last 168 hours. No results for input(s): LIPASE, AMYLASE in the last 168 hours. No results for input(s): AMMONIA in the last 168 hours. CBC:  Recent Labs Lab 03/31/15 1323 03/31/15 1328  WBC 9.1  --   HGB 14.1 14.3  HCT 41.1 42.0  MCV  89.9  --   PLT 277  --    Cardiac Enzymes: No results for input(s): CKTOTAL, CKMB, CKMBINDEX, TROPONINI in the last 168 hours. BNP: Invalid input(s): POCBNP CBG:  Recent Labs Lab 03/31/15 1322  GLUCAP 113*    If 7PM-7AM, please contact night-coverage [email or web address] Password [facility] 03/31/2015, 2:48 PM

## 2015-04-01 ENCOUNTER — Inpatient Hospital Stay (HOSPITAL_COMMUNITY): Payer: Medicare Other

## 2015-04-01 DIAGNOSIS — I4891 Unspecified atrial fibrillation: Secondary | ICD-10-CM

## 2015-04-01 DIAGNOSIS — E785 Hyperlipidemia, unspecified: Secondary | ICD-10-CM

## 2015-04-01 DIAGNOSIS — R55 Syncope and collapse: Secondary | ICD-10-CM

## 2015-04-01 DIAGNOSIS — I482 Chronic atrial fibrillation: Secondary | ICD-10-CM

## 2015-04-01 DIAGNOSIS — T671XXD Heat syncope, subsequent encounter: Secondary | ICD-10-CM

## 2015-04-01 DIAGNOSIS — I10 Essential (primary) hypertension: Secondary | ICD-10-CM

## 2015-04-01 DIAGNOSIS — N184 Chronic kidney disease, stage 4 (severe): Secondary | ICD-10-CM

## 2015-04-01 LAB — COMPREHENSIVE METABOLIC PANEL
ALK PHOS: 76 U/L (ref 38–126)
ALT: 11 U/L — ABNORMAL LOW (ref 17–63)
AST: 44 U/L — ABNORMAL HIGH (ref 15–41)
Albumin: 3.1 g/dL — ABNORMAL LOW (ref 3.5–5.0)
Anion gap: 8 (ref 5–15)
BUN: 19 mg/dL (ref 6–20)
CALCIUM: 8.6 mg/dL — AB (ref 8.9–10.3)
CO2: 27 mmol/L (ref 22–32)
Chloride: 104 mmol/L (ref 101–111)
Creatinine, Ser: 1.25 mg/dL — ABNORMAL HIGH (ref 0.61–1.24)
GFR calc non Af Amer: 50 mL/min — ABNORMAL LOW (ref >60)
GFR, EST AFRICAN AMERICAN: 58 mL/min — AB (ref >60)
Glucose, Bld: 126 mg/dL — ABNORMAL HIGH (ref 65–99)
Potassium: 5.6 mmol/L — ABNORMAL HIGH (ref 3.5–5.1)
Sodium: 139 mmol/L (ref 135–145)
Total Bilirubin: 1.7 mg/dL — ABNORMAL HIGH (ref 0.3–1.2)
Total Protein: 5.7 g/dL — ABNORMAL LOW (ref 6.5–8.1)

## 2015-04-01 LAB — CBC
HCT: 36.6 % — ABNORMAL LOW (ref 39.0–52.0)
Hemoglobin: 12.1 g/dL — ABNORMAL LOW (ref 13.0–17.0)
MCH: 30 pg (ref 26.0–34.0)
MCHC: 33.1 g/dL (ref 30.0–36.0)
MCV: 90.6 fL (ref 78.0–100.0)
Platelets: 258 10*3/uL (ref 150–400)
RBC: 4.04 MIL/uL — AB (ref 4.22–5.81)
RDW: 14.6 % (ref 11.5–15.5)
WBC: 8.6 10*3/uL (ref 4.0–10.5)

## 2015-04-01 LAB — GLUCOSE, CAPILLARY: Glucose-Capillary: 109 mg/dL — ABNORMAL HIGH (ref 65–99)

## 2015-04-01 LAB — TROPONIN I

## 2015-04-01 MED ORDER — SODIUM POLYSTYRENE SULFONATE 15 GM/60ML PO SUSP
15.0000 g | Freq: Once | ORAL | Status: AC
  Administered 2015-04-01: 15 g via ORAL
  Filled 2015-04-01: qty 60

## 2015-04-01 NOTE — Progress Notes (Signed)
Nutrition Brief Note  Patient identified on the Malnutrition Screening Tool (MST) Report  Wt Readings from Last 15 Encounters:  04/01/15 187 lb 4.8 oz (84.959 kg)  01/24/15 195 lb 12.8 oz (88.814 kg)  01/20/15 198 lb 1.6 oz (89.858 kg)    Body mass index is 27.65 kg/(m^2). Patient meets criteria for Overweight based on current BMI. Per reviewed of pt's chart, his usual body weight is 190 lbs.   Current diet order is Regular, patient is consuming approximately 100% of meals at this time. Labs and medications reviewed.   No nutrition interventions warranted at this time. If nutrition issues arise, please consult RD.   Ian Malkin RD, LDN Inpatient Clinical Dietitian Pager: [phone] After Hours Pager: [phone]

## 2015-04-01 NOTE — Progress Notes (Signed)
[facility] PROGRESS NOTE  Seth [name] BUL:[id number] DOB: Jan 27, 1925 DOA: 03/31/2015 PCP: No primary care provider on file.  Assessment/Plan: Principal Problem:  Syncope and Fall Possibly related to exertion and heat. Head CT shows small amount of hemorrhage layering dependently in the occipital horn of the left lateral ventricle.  ECHO shows normal systolic function with an estimated EF of 55-60%. Results consistent with abnormal left ventricular relaxation (grade 1 dystolic dysfunction). Aortic valve moderately calcified. Full conclusions listed below. Patient will continue Eliquis and will repeat scan only if neurological status changes, per neurosurgery - Dr. Yetta Barre. Continue telemetry.  Active Problems: Chronic Kidney Disease stage IV Continue lisinopril and IV fluids 90ml/hr Creatinine 1.25. Continue monitoring renal function  HTN Continue metoprolol and lisinopril  Atrial Fibrillation Continue Eliquis for anticoagulation and metoprolol for rate control  Dyslipidemia Continue simvastatin  Code Status: Full Family Communication: Plan of care discussed with patient and daughter in law at bedside Disposition Plan: discharge 04/02/15   Consultants:  None   Procedures:   echocardiogram  Antibiotics: None  HPI/Subjective: Seth [name], 81 y/o male with a history of HTN, dyslipidemia, atrial fibrillation on Elequis, and aortic stenosis presented post syncope and fall with loss of consciousness. History was given by patient and daughter in law. He denied prodromal symptoms. He states he has a big knot and bruise on his head from where he hit, but denies HAs.   CT of head showed small amounts of hemorrhage in occipital horn of left lateral ventricle.   Objective: Filed Vitals:   04/01/15 0554  BP: 138/86  Pulse: 81  Temp: 97.8 F (36.6 C)  Resp: 18    Intake/Output Summary (Last 24 hours) at 04/01/15 1218 Last data filed at 04/01/15 0857  Gross per  24 hour  Intake 1325.83 ml  Output   1000 ml  Net 325.83 ml   Filed Weights   03/31/15 1608 04/01/15 0554  Weight: 85 kg (187 lb 6.3 oz) 84.959 kg (187 lb 4.8 oz)    Exam:   General:  Alert and oriented, WDWN in no acute distress.  Cardiovascular: RRR, systolic murmur  Respiratory: CTAB, normal respiratory effort, no adventitious lung sounds  Abdomen: nt, nd, normal bowel sounds, no masses  Musculoskeletal: No decreased ROM, edema, or tenderness noted.    Data Reviewed: Basic Metabolic Panel:  Recent Labs Lab 03/31/15 1323 03/31/15 1328 03/31/15 1718 04/01/15 0330  NA 138 140 137 139  K 4.3 4.3 4.2 5.6*  CL 102 102 101 104  CO2 27  --  28 27  GLUCOSE 121* 120* 202* 126*  BUN 21* 24* 19 19  CREATININE 1.21 1.30* 1.25* 1.25*  CALCIUM 9.1  --  8.9 8.6*  MG  --   --  1.9  --   PHOS  --   --  3.6  --    Liver Function Tests:  Recent Labs Lab 03/31/15 1718 04/01/15 0330  AST 25 44*  ALT 17 11*  ALKPHOS 84 76  BILITOT 0.9 1.7*  PROT 6.5 5.7*  ALBUMIN 3.5 3.1*   CBC:  Recent Labs Lab 03/31/15 1323 03/31/15 1328 03/31/15 1718 04/01/15 0330  WBC 9.1  --  10.4 8.6  NEUTROABS  --   --  7.5  --   HGB 14.1 14.3 13.4 12.1*  HCT 41.1 42.0 40.8 36.6*  MCV 89.9  --  90.9 90.6  PLT 277  --  306 258   Cardiac Enzymes:  Recent Labs Lab 03/31/15 1718 03/31/15  2200 04/01/15 0330  TROPONINI <0.03 <0.03 <0.03   BNP (last 3 results)  Recent Labs  01/17/15 0149  BNP 577.8*    ProBNP (last 3 results) No results for input(s): PROBNP in the last 8760 hours.  CBG:  Recent Labs Lab 03/31/15 1322 04/01/15 0553  GLUCAP 113* 109*     Studies: Ct Head Wo Contrast  03/31/2015   CLINICAL DATA:  Unwitnessed fall with loss of consciousness. Struck the back of the head on concrete.  EXAM: CT HEAD WITHOUT CONTRAST  CT CERVICAL SPINE WITHOUT CONTRAST  TECHNIQUE: Multidetector CT imaging of the head and cervical spine was performed following the standard  protocol without intravenous contrast. Multiplanar CT image reconstructions of the cervical spine were also generated.  COMPARISON:  MRI 04/14/2010  FINDINGS: CT HEAD FINDINGS  There is a left posterior parietal scalp hematoma without evidence of underlying skull fracture.  There chronic small-vessel ischemic changes affecting the brainstem. 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 The cerebral hemispheres show generalized atrophy and chronic small-vessel disease of the white matter. I do not see diffuse subarachnoid blood, but there is a small amount of blood layering dependently in the occipital horn of the left lateral ventricle. The ventricles are large, but in proportion to the degree of atrophy. No evidence of mass lesion. No intraparenchymal hemorrhage. No fluid in the sinuses.  CT CERVICAL SPINE FINDINGS  Alignment is normal. No fracture. No soft tissue swelling. There is chronic fusion at the C3-4 level and at the C5-6 level. There is degenerative spondylosis at C6-7 with mild osteophytic encroachment upon the canal and foramina. Large left-sided thyroid goiter with intrathoracic extension.  IMPRESSION: Head CT: Small amount of hemorrhage layering dependently in the occipital horn of the left lateral ventricle. No evidence of more extensive subarachnoid hemorrhage. Presumably this is a small amount of traumatic hemorrhage. Atrophy and chronic small-vessel disease elsewhere. Left parietal scalp hematoma without underlying skull fracture.  Cervical spine CT: No acute or traumatic finding. Chronic degenerative changes. Left-sided thyroid goiter.  Critical Value/emergent results were called by telephone at the time of interpretation on 03/31/2015 at 12:54 pm to Dr. Cathren Laine , who verbally acknowledged these results.   Electronically Signed   By: Paulina Fusi M.D.   On: 03/31/2015 12:55   Ct Cervical Spine Wo Contrast  03/31/2015   CLINICAL DATA:  Unwitnessed fall with loss of consciousness.  Struck the back of the head on concrete.  EXAM: CT HEAD WITHOUT CONTRAST  CT CERVICAL SPINE WITHOUT CONTRAST  TECHNIQUE: Multidetector CT imaging of the head and cervical spine was performed following the standard protocol without intravenous contrast. Multiplanar CT image reconstructions of the cervical spine were also generated.  COMPARISON:  MRI 04/14/2010  FINDINGS: CT HEAD FINDINGS  There is a left posterior parietal scalp hematoma without evidence of underlying skull fracture.  There chronic small-vessel ischemic changes affecting the brainstem. 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 The cerebral hemispheres show generalized atrophy and chronic small-vessel disease of the white matter. I do not see diffuse subarachnoid blood, but there is a small amount of blood layering dependently in the occipital horn of the left lateral ventricle. The ventricles are large, but in proportion to the degree of atrophy. No evidence of mass lesion. No intraparenchymal hemorrhage. No fluid in the sinuses.  CT CERVICAL SPINE FINDINGS  Alignment is normal. No fracture. No soft tissue swelling. There is chronic fusion at the C3-4 level and at the C5-6 level. There is  degenerative spondylosis at C6-7 with mild osteophytic encroachment upon the canal and foramina. Large left-sided thyroid goiter with intrathoracic extension.  IMPRESSION: Head CT: Small amount of hemorrhage layering dependently in the occipital horn of the left lateral ventricle. No evidence of more extensive subarachnoid hemorrhage. Presumably this is a small amount of traumatic hemorrhage. Atrophy and chronic small-vessel disease elsewhere. Left parietal scalp hematoma without underlying skull fracture.  Cervical spine CT: No acute or traumatic finding. Chronic degenerative changes. Left-sided thyroid goiter.  Critical Value/emergent results were called by telephone at the time of interpretation on 03/31/2015 at 12:54 pm to Dr. Cathren Laine , who verbally  acknowledged these results.   Electronically Signed   By: Paulina Fusi M.D.   On: 03/31/2015 12:55   Echocardiogram Study Conclusions 04/01/15: - Left ventricle: The cavity size was normal. Systolic function was normal. The estimated ejection fraction was in the range of 55% to 60%. Wall motion was normal; there were no regional wall motion abnormalities. There was an increased relative contribution of atrial contraction to ventricular filling. Doppler parameters are consistent with abnormal left ventricular relaxation (grade 1 diastolic dysfunction). - Aortic valve: Moderately calcified annulus. Trileaflet. Severe diffuse thickening and calcification. Valve mobility was restricted and the Right coronary cusp is immobile. There was moderate stenosis. Valve area (VTI): 1.33 cm^2. Valve area (Vmax): 1.2 cm^2. Valve area (Vmean): 1.13 cm^2. - Left atrium: The atrium was moderately dilated.   Scheduled Meds: . apixaban  2.5 mg Oral BID  . lisinopril  20 mg Oral Daily  . metoprolol succinate  50 mg Oral Daily  . simvastatin  10 mg Oral QHS  . sodium chloride  3 mL Intravenous Q12H  . sodium polystyrene  15 g Oral Once   Continuous Infusions: . sodium chloride 50 mL/hr at 03/31/15 1641    Principal Problem:   Syncope Active Problems:   Chronic kidney disease (CKD), stage IV (severe)   HTN (hypertension)   Atrial fibrillation   Dyslipidemia    Time spent: 45    Elenore Paddy PA-S [facility] Pager [phone] If 7PM-7AM, please contact night-coverage at [email or web address], password [facility] 04/01/2015, 12:18 PM  LOS: 1 day       Addendum  Patient seen and examined, chart and data base reviewed.  I agree with the above assessment and plan.  For full details please see Mrs. Elenore Paddy PA-S note.  I reviewed and amended the above note as appropriate.   Clint Lipps, MD [facility] Pager: [phone] 04/01/2015, 1:34 PM

## 2015-04-01 NOTE — Progress Notes (Signed)
  Echocardiogram 2D Echocardiogram has been performed.  Delcie Roch 04/01/2015, 9:46 AM

## 2015-04-02 ENCOUNTER — Encounter (HOSPITAL_COMMUNITY): Payer: Self-pay | Admitting: Student

## 2015-04-02 DIAGNOSIS — N183 Chronic kidney disease, stage 3 (moderate): Secondary | ICD-10-CM

## 2015-04-02 LAB — GLUCOSE, CAPILLARY: Glucose-Capillary: 131 mg/dL — ABNORMAL HIGH (ref 65–99)

## 2015-04-02 NOTE — Discharge Summary (Signed)
Physician Discharge Summary  Seth [name] UJW:[id number] DOB: May 11, 1925 DOA: 03/31/2015  PCP:  Duane Lope, MD  Admit date: 03/31/2015 Discharge date: 04/02/2015  Time spent: 30 minutes  Recommendations for Outpatient Follow-up:  1. Follow up with primary care provider  Discharge Diagnoses:  Principal Problem:   Syncope Active Problems:   CKD (chronic kidney disease), stage III   HTN (hypertension)   Atrial fibrillation   Dyslipidemia   Discharge Condition: Patient is in good condition and is able to walk the hallway. He has a skin laceration in his left occiput region.  Diet recommendation: Low sodium, heart healthy diet  Filed Weights   03/31/15 1608 04/01/15 0554 04/02/15 0423  Weight: 85 kg (187 lb 6.3 oz) 84.959 kg (187 lb 4.8 oz) 85.458 kg (188 lb 6.4 oz)    History of present illness:  Seth [name], 81 y/o male with a history of HTN, dyslipidemia, atrial fibrillation on Elequis, and aortic stenosis presented post syncope and fall with loss of consciousness. History was given by patient and daughter in law. He denied prodromal symptoms. He states he has a big knot and bruise on his head from where he hit, but denies HAs. He verbalizes that he is ready to be discharged.  Hospital Course:   Syncope and Fall Possibly related to exertion and heat. Head CT shows small amount of hemorrhage layering dependently in the occipital horn of the left lateral ventricle.  ECHO shows normal systolic function with an estimated EF of 55-60%. Results consistent with abnormal left ventricular relaxation (grade 1 dystolic dysfunction). Aortic valve moderately calcified. Full conclusions listed below. Continue Eliquis, per Dr. Yetta Barre in neurosurgery. There was no AMS or focal neurological findings during his hospital stay.  Chronic Kidney Disease stage III Treated with Lisinopril and IV fluids. Will continue Lisinopril. Renal function was monitored. Most recent creatinine was 1.25 on  04/01/15.  HTN Metoprolol and lisinopril were continued throughout hospital stay. Metoprolol and lisinopril will be continued upon discharge.  Atrial Fibrillation Continued Eliquis for anticoagulation and metoprolol for rate control during hospital stay. Will continue upon discharge.  Dyslipidemia Continued simvastatin during hospital stay and will continue upon discharge.   Procedures:  Echocardiogram  Consultations:  none  Discharge Exam: Filed Vitals:   04/02/15 0937  BP: 112/53  Pulse: 73  Temp:   Resp: 18    General: Alert and oriented, WDWN in no acute distress.  Cardiovascular: RRR, systolic murmur Respiratory: CTAB, normal respiratory effort, no adventitious lung sounds Skin: healing skin lesion on his left occiput region from hitting his head when he fell.   Discharge Instructions    Current Discharge Medication List    CONTINUE these medications which have NOT CHANGED   Details  acetaminophen (TYLENOL) 325 MG tablet Take 650 mg by mouth every 6 (six) hours as needed.    apixaban (ELIQUIS) 2.5 MG TABS tablet Take 2.5 mg by mouth 2 (two) times daily.    CINNAMON PO Take 1 capsule by mouth daily.    lisinopril (PRINIVIL,ZESTRIL) 20 MG tablet Take 20 mg by mouth daily.    metoprolol succinate (TOPROL-XL) 50 MG 24 hr tablet Take 50 mg by mouth daily. Take with or immediately following a meal.    simvastatin (ZOCOR) 10 MG tablet Take 10 mg by mouth at bedtime.       No Known Allergies    The results of significant diagnostics from this hospitalization (including imaging, microbiology, ancillary and laboratory) are listed below for reference.  Significant Diagnostic Studies: Ct Head Wo Contrast  03/31/2015   CLINICAL DATA:  Unwitnessed fall with loss of consciousness. Struck the back of the head on concrete.  EXAM: CT HEAD WITHOUT CONTRAST  CT CERVICAL SPINE WITHOUT CONTRAST  TECHNIQUE: Multidetector CT imaging of the head and cervical spine was  performed following the standard protocol without intravenous contrast. Multiplanar CT image reconstructions of the cervical spine were also generated.  COMPARISON:  MRI 04/14/2010  FINDINGS: CT HEAD FINDINGS  There is a left posterior parietal scalp hematoma without evidence of underlying skull fracture.  There chronic small-vessel ischemic changes affecting the brainstem. 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 The cerebral hemispheres show generalized atrophy and chronic small-vessel disease of the white matter. I do not see diffuse subarachnoid blood, but there is a small amount of blood layering dependently in the occipital horn of the left lateral ventricle. The ventricles are large, but in proportion to the degree of atrophy. No evidence of mass lesion. No intraparenchymal hemorrhage. No fluid in the sinuses.  CT CERVICAL SPINE FINDINGS  Alignment is normal. No fracture. No soft tissue swelling. There is chronic fusion at the C3-4 level and at the C5-6 level. There is degenerative spondylosis at C6-7 with mild osteophytic encroachment upon the canal and foramina. Large left-sided thyroid goiter with intrathoracic extension.  IMPRESSION: Head CT: Small amount of hemorrhage layering dependently in the occipital horn of the left lateral ventricle. No evidence of more extensive subarachnoid hemorrhage. Presumably this is a small amount of traumatic hemorrhage. Atrophy and chronic small-vessel disease elsewhere. Left parietal scalp hematoma without underlying skull fracture.  Cervical spine CT: No acute or traumatic finding. Chronic degenerative changes. Left-sided thyroid goiter.  Critical Value/emergent results were called by telephone at the time of interpretation on 03/31/2015 at 12:54 pm to Dr. Cathren Laine , who verbally acknowledged these results.   Electronically Signed   By: Paulina Fusi M.D.   On: 03/31/2015 12:55   Ct Cervical Spine Wo Contrast  03/31/2015   CLINICAL DATA:  Unwitnessed fall  with loss of consciousness. Struck the back of the head on concrete.  EXAM: CT HEAD WITHOUT CONTRAST  CT CERVICAL SPINE WITHOUT CONTRAST  TECHNIQUE: Multidetector CT imaging of the head and cervical spine was performed following the standard protocol without intravenous contrast. Multiplanar CT image reconstructions of the cervical spine were also generated.  COMPARISON:  MRI 04/14/2010  FINDINGS: CT HEAD FINDINGS  There is a left posterior parietal scalp hematoma without evidence of underlying skull fracture.  There chronic small-vessel ischemic changes affecting the brainstem. 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 The cerebral hemispheres show generalized atrophy and chronic small-vessel disease of the white matter. I do not see diffuse subarachnoid blood, but there is a small amount of blood layering dependently in the occipital horn of the left lateral ventricle. The ventricles are large, but in proportion to the degree of atrophy. No evidence of mass lesion. No intraparenchymal hemorrhage. No fluid in the sinuses.  CT CERVICAL SPINE FINDINGS  Alignment is normal. No fracture. No soft tissue swelling. There is chronic fusion at the C3-4 level and at the C5-6 level. There is degenerative spondylosis at C6-7 with mild osteophytic encroachment upon the canal and foramina. Large left-sided thyroid goiter with intrathoracic extension.  IMPRESSION: Head CT: Small amount of hemorrhage layering dependently in the occipital horn of the left lateral ventricle. No evidence of more extensive subarachnoid hemorrhage. Presumably this is a small amount of traumatic hemorrhage. Atrophy  and chronic small-vessel disease elsewhere. Left parietal scalp hematoma without underlying skull fracture.  Cervical spine CT: No acute or traumatic finding. Chronic degenerative changes. Left-sided thyroid goiter.  Critical Value/emergent results were called by telephone at the time of interpretation on 03/31/2015 at 12:54 pm to Dr.  Cathren Laine , who verbally acknowledged these results.   Electronically Signed   By: Paulina Fusi M.D.   On: 03/31/2015 12:55   Echocardiogram Study Conclusions 04/01/15: - Left ventricle: The cavity size was normal. Systolic function was normal. The estimated ejection fraction was in the range of 55% to 60%. Wall motion was normal; there were no regional wall motion abnormalities. There was an increased relative contribution of atrial contraction to ventricular filling. Doppler parameters are consistent with abnormal left ventricular relaxation (grade 1 diastolic dysfunction). - Aortic valve: Moderately calcified annulus. Trileaflet. Severe diffuse thickening and calcification. Valve mobility was restricted and the Right coronary cusp is immobile. There was moderate stenosis. Valve area (VTI): 1.33 cm^2. Valve area (Vmax): 1.2 cm^2. Valve area (Vmean): 1.13 cm^2. - Left atrium: The atrium was moderately dilated.   Labs: Basic Metabolic Panel:  Recent Labs Lab 03/31/15 1323 03/31/15 1328 03/31/15 1718 04/01/15 0330  NA 138 140 137 139  K 4.3 4.3 4.2 5.6*  CL 102 102 101 104  CO2 27  --  28 27  GLUCOSE 121* 120* 202* 126*  BUN 21* 24* 19 19  CREATININE 1.21 1.30* 1.25* 1.25*  CALCIUM 9.1  --  8.9 8.6*  MG  --   --  1.9  --   PHOS  --   --  3.6  --    Liver Function Tests:  Recent Labs Lab 03/31/15 1718 04/01/15 0330  AST 25 44*  ALT 17 11*  ALKPHOS 84 76  BILITOT 0.9 1.7*  PROT 6.5 5.7*  ALBUMIN 3.5 3.1*   CBC:  Recent Labs Lab 03/31/15 1323 03/31/15 1328 03/31/15 1718 04/01/15 0330  WBC 9.1  --  10.4 8.6  NEUTROABS  --   --  7.5  --   HGB 14.1 14.3 13.4 12.1*  HCT 41.1 42.0 40.8 36.6*  MCV 89.9  --  90.9 90.6  PLT 277  --  306 258   Cardiac Enzymes:  Recent Labs Lab 03/31/15 1718 03/31/15 2200 04/01/15 0330  TROPONINI <0.03 <0.03 <0.03   BNP: BNP (last 3 results)  Recent Labs  01/17/15 0149  BNP 577.8*      CBG:  Recent Labs Lab 03/31/15 1322 04/01/15 0553 04/02/15 0629  GLUCAP 113* 109* 131*       Signed:  Elenore Paddy PA-S [facility] 04/02/2015, 9:56 AM   Addendum  Patient seen and examined, chart and data base reviewed.  I agree with the above assessment and plan.  For full details please see Mrs. Elenore Paddy PA-S note.  I reviewed and amended the above note as appropriate.   Clint Lipps, MD [facility] Pager: [phone] 04/02/2015, 11:44 AM

## 2015-04-02 NOTE — Progress Notes (Signed)
UR completed 

## 2015-04-02 NOTE — Progress Notes (Signed)
Pt has orders to be discharged. Discharge instructions given and pt has no additional questions at this time. Medication regimen reviewed and pt educated. Pt verbalized understanding and has no additional questions. Telemetry box removed. IV removed and site in good condition. Pt stable and waiting for transportation.   Zahria Ding RN 

## 2015-04-02 NOTE — Discharge Instructions (Signed)
Follow up with primary care physician

## 2015-04-08 DIAGNOSIS — R55 Syncope and collapse: Secondary | ICD-10-CM | POA: Diagnosis not present

## 2015-04-08 DIAGNOSIS — T148 Other injury of unspecified body region: Secondary | ICD-10-CM | POA: Diagnosis not present

## 2015-04-15 ENCOUNTER — Other Ambulatory Visit: Payer: Self-pay

## 2015-04-26 ENCOUNTER — Encounter (HOSPITAL_COMMUNITY): Payer: Self-pay | Admitting: Nurse Practitioner

## 2015-04-26 ENCOUNTER — Ambulatory Visit (HOSPITAL_COMMUNITY)
Admission: RE | Admit: 2015-04-26 | Discharge: 2015-04-26 | Disposition: A | Discharge status: Home / Self Care | Payer: Medicare Other | Source: Ambulatory Visit | Attending: Nurse Practitioner | Admitting: Nurse Practitioner

## 2015-04-26 VITALS — BP 128/78 | HR 66 | Ht 69.0 in | Wt 191.4 lb

## 2015-04-26 DIAGNOSIS — I480 Paroxysmal atrial fibrillation: Secondary | ICD-10-CM | POA: Diagnosis not present

## 2015-04-26 DIAGNOSIS — I4891 Unspecified atrial fibrillation: Secondary | ICD-10-CM | POA: Insufficient documentation

## 2015-04-26 DIAGNOSIS — I440 Atrioventricular block, first degree: Secondary | ICD-10-CM | POA: Diagnosis not present

## 2015-04-26 NOTE — Progress Notes (Signed)
Patient ID: Seth [name], male   DOB: [birth year], 81 y.o.   MRN: [id number]     Primary Care Physician: Dr. Duane Lope  Seth [name] is a 81 y.o. male with a h/o  hospitalization in April for sepsis secondary to UTI, with Afib with RVR.History of AS. He converted to SR and was started on Eliquis, at first on 2.5 mg bid due to creatinine of 2.05. Chadsvasc score of at least 5. However renal status improved to 1.28 and dose was increased to  bid. He was maintaining SR.   Saw his PCP  for dark urine shortly after d/c and was found to have  large amt blood in urine and the dose of apixaban was decreased to 2.5 mg bid with improvement with hematuria. Hypotension was present in the hospital,but BP stable stable at  110 systolic on first afib visit in April. He  does not drink alcohol, smoke or sound like he has significant snoring issues. No changes were made and he was asked to f/u in the afib clinic in 3 months.  Today he returns to afib clinic for routine f/u but unfortunately,with another hospitalizationdue to syncope. Hospitalized 6/09-23-13. Thought to possibly be due to dehydration and heat. EKG did show aflutter with v rates in the 70's on presentation but in SR today. He is alert and appropriately responsive and is here with son and daughter-in- Social worker.Head CT showed small amount of hemorrhage layering dependently in the occipital horn of the left lateral ventricle. Cleared to continue eliquis per Dr. Yetta Barre in neurosurgery. He lives by himself but wears a fall device with a motion sensor to detect a fall, which actually alerted EMS to his syncopal episode. ECHO shows normal systolic function with an estimated EF of 55-60%. Results consistent with abnormal left ventricular relaxation (grade 1 dystolic dysfunction). Aortic valve moderately calcified.   He currently denies symptoms of palpitations, chest pain, shortness of breath, orthopnea, PND, lower extremity edema, dizziness, presyncope, syncope,  or neurologic sequela. The patient is tolerating medications without difficulties, other than stated above and is otherwise without complaint today.   Past Medical History  Diagnosis Date  . Hypertension   . Hyperlipidemia   . Aortic stenosis    No past surgical history on file.  Current Outpatient Prescriptions  Medication Sig Dispense Refill  . acetaminophen (TYLENOL) 325 MG tablet Take 650 mg by mouth every 6 (six) hours as needed.    [name] apixaban (ELIQUIS) 2.5 MG TABS tablet Take 2.5 mg by mouth 2 (two) times daily.    [name] CINNAMON PO Take 1 capsule by mouth daily.    . metoprolol succinate (TOPROL-XL) 50 MG 24 hr tablet Take 50 mg by mouth daily. Take with or immediately following a meal.    . simvastatin (ZOCOR) 10 MG tablet Take 10 mg by mouth at bedtime.     No current facility-administered medications for this encounter.    No Known Allergies  History   Social History  . Marital Status: Married    Spouse Name: N/A  . Number of Children: N/A  . Years of Education: N/A   Occupational History  . Not on file.   Social History Main Topics  . Smoking status: Former Games developer  . Smokeless tobacco: Never Used  . Alcohol Use: No  . Drug Use: No  . Sexual Activity: Not on file   Other Topics Concern  . Not on file   Social History Narrative    No family history  on file.  ROS- All systems are reviewed and negative except as per the HPI above  Physical Exam: Filed Vitals:   04/26/15 1047  BP: 128/78  Pulse: 66  Height: 5\' 9"  (1.753 m)  Weight: 191 lb 6.4 oz (86.818 kg)    GEN- The patient is well appearing, alert and oriented x 3 today.   Head- normocephalic, atraumatic Eyes-  Sclera clear, conjunctiva pink Ears- hearing intact Oropharynx- clear Neck- supple, no JVP Lymph- no cervical lymphadenopathy Lungs- Clear to ausculation bilaterally, normal work of breathing Heart- Regular rate and rhythm,with 2-3/6 harsh sys murmur, rubs or gallops, PMI not laterally  displaced GI- soft, NT, ND, + BS Extremities- no clubbing, cyanosis,  minimal pedal edema,chronic MS- no significant deformity or atrophy Skin- no rash or lesion Psych- euthymic mood, full affect Neuro- strength and sensation are intact  EKG-NSR with 1st degree av block,septal infarct,66 bpm, age undetermined. Epic records reviewed. Echo-Left ventricle: The cavity size was normal. Systolic function was normal. The estimated ejection fraction was in the range of 55% to 60%. Wall motion was normal; there were no regional wall motion abnormalities. There was an increased relative contribution of atrial contraction to ventricular filling. Doppler parameters are consistent with abnormal left ventricular relaxation (grade 1 diastolic dysfunction). - Aortic valve: Moderately calcified annulus. Trileaflet. Severe diffuse thickening and calcification. Valve mobility was restricted and the Right coronary cusp is immobile. There was moderate stenosis. Valve area (VTI): 1.33 cm^2. Valve area (Vmax): 1.2 cm^2. Valve area (Vmean): 1.13 cm^2. - Left atrium: The atrium was moderately dilated. Assessment and Plan:  1.Afib  Currently in SR Continue Metoprolol  2. Chadsvasc score of alt least 5 Dose  of Eliquis decreased to 2.5 mg bid  by PCP for hematuria, resolved. Head CT showed small amoutnt of hemorrhage, by neurosurgery okay to continue OAC  3. Syncope In aflutter on presentation to hospital, rate controlled in 70's Today in SR I doubt rhythm played into syncopal episode.  4. Aortic stenosis ? Could have possibly played into  syncope  Will get established with cardiologist to follow AS/ afib/flutter/syncope Afib clinic as needed.

## 2015-04-26 NOTE — Patient Instructions (Signed)
Scheduler will contact you regarding establishing with general cardiology

## 2015-05-07 ENCOUNTER — Emergency Department (HOSPITAL_COMMUNITY): Payer: Medicare Other

## 2015-05-07 ENCOUNTER — Encounter (HOSPITAL_COMMUNITY): Payer: Self-pay | Admitting: Emergency Medicine

## 2015-05-07 ENCOUNTER — Emergency Department (HOSPITAL_COMMUNITY)
Admission: EM | Admit: 2015-05-07 | Discharge: 2015-05-07 | Disposition: A | Discharge status: Home / Self Care | Payer: Medicare Other | Attending: Emergency Medicine | Admitting: Emergency Medicine

## 2015-05-07 DIAGNOSIS — Y998 Other external cause status: Secondary | ICD-10-CM | POA: Diagnosis not present

## 2015-05-07 DIAGNOSIS — Z79899 Other long term (current) drug therapy: Secondary | ICD-10-CM | POA: Insufficient documentation

## 2015-05-07 DIAGNOSIS — Y9301 Activity, walking, marching and hiking: Secondary | ICD-10-CM | POA: Diagnosis not present

## 2015-05-07 DIAGNOSIS — S0990XA Unspecified injury of head, initial encounter: Secondary | ICD-10-CM

## 2015-05-07 DIAGNOSIS — E785 Hyperlipidemia, unspecified: Secondary | ICD-10-CM | POA: Insufficient documentation

## 2015-05-07 DIAGNOSIS — S0101XA Laceration without foreign body of scalp, initial encounter: Secondary | ICD-10-CM | POA: Diagnosis not present

## 2015-05-07 DIAGNOSIS — M542 Cervicalgia: Secondary | ICD-10-CM | POA: Diagnosis not present

## 2015-05-07 DIAGNOSIS — S0003XA Contusion of scalp, initial encounter: Secondary | ICD-10-CM | POA: Diagnosis not present

## 2015-05-07 DIAGNOSIS — S0190XA Unspecified open wound of unspecified part of head, initial encounter: Secondary | ICD-10-CM | POA: Diagnosis not present

## 2015-05-07 DIAGNOSIS — W19XXXA Unspecified fall, initial encounter: Secondary | ICD-10-CM

## 2015-05-07 DIAGNOSIS — Y92009 Unspecified place in unspecified non-institutional (private) residence as the place of occurrence of the external cause: Secondary | ICD-10-CM | POA: Diagnosis not present

## 2015-05-07 DIAGNOSIS — R55 Syncope and collapse: Secondary | ICD-10-CM | POA: Diagnosis not present

## 2015-05-07 DIAGNOSIS — W01198A Fall on same level from slipping, tripping and stumbling with subsequent striking against other object, initial encounter: Secondary | ICD-10-CM | POA: Diagnosis not present

## 2015-05-07 DIAGNOSIS — Z87891 Personal history of nicotine dependence: Secondary | ICD-10-CM | POA: Diagnosis not present

## 2015-05-07 DIAGNOSIS — S199XXA Unspecified injury of neck, initial encounter: Secondary | ICD-10-CM | POA: Diagnosis not present

## 2015-05-07 DIAGNOSIS — I10 Essential (primary) hypertension: Secondary | ICD-10-CM | POA: Insufficient documentation

## 2015-05-07 DIAGNOSIS — R51 Headache: Secondary | ICD-10-CM | POA: Diagnosis not present

## 2015-05-07 DIAGNOSIS — S098XXA Other specified injuries of head, initial encounter: Secondary | ICD-10-CM | POA: Diagnosis not present

## 2015-05-07 LAB — CBC WITH DIFFERENTIAL/PLATELET
Basophils Absolute: 0 10*3/uL (ref 0.0–0.1)
Basophils Relative: 0 % (ref 0–1)
Eosinophils Absolute: 0.3 10*3/uL (ref 0.0–0.7)
Eosinophils Relative: 3 % (ref 0–5)
HCT: 41.3 % (ref 39.0–52.0)
HEMOGLOBIN: 14 g/dL (ref 13.0–17.0)
LYMPHS ABS: 1.1 10*3/uL (ref 0.7–4.0)
Lymphocytes Relative: 11 % — ABNORMAL LOW (ref 12–46)
MCH: 30.7 pg (ref 26.0–34.0)
MCHC: 33.9 g/dL (ref 30.0–36.0)
MCV: 90.6 fL (ref 78.0–100.0)
MONO ABS: 1.1 10*3/uL — AB (ref 0.1–1.0)
MONOS PCT: 11 % (ref 3–12)
Neutro Abs: 7.5 10*3/uL (ref 1.7–7.7)
Neutrophils Relative %: 75 % (ref 43–77)
Platelets: 283 10*3/uL (ref 150–400)
RBC: 4.56 MIL/uL (ref 4.22–5.81)
RDW: 13.8 % (ref 11.5–15.5)
WBC: 10 10*3/uL (ref 4.0–10.5)

## 2015-05-07 LAB — COMPREHENSIVE METABOLIC PANEL
ALT: 22 U/L (ref 17–63)
AST: 24 U/L (ref 15–41)
Albumin: 3.6 g/dL (ref 3.5–5.0)
Alkaline Phosphatase: 92 U/L (ref 38–126)
Anion gap: 7 (ref 5–15)
BILIRUBIN TOTAL: 1.3 mg/dL — AB (ref 0.3–1.2)
BUN: 20 mg/dL (ref 6–20)
CHLORIDE: 106 mmol/L (ref 101–111)
CO2: 26 mmol/L (ref 22–32)
CREATININE: 1.11 mg/dL (ref 0.61–1.24)
Calcium: 9.1 mg/dL (ref 8.9–10.3)
GFR calc Af Amer: >60 mL/min (ref >60)
GFR, EST NON AFRICAN AMERICAN: 57 mL/min — AB (ref >60)
GLUCOSE: 151 mg/dL — AB (ref 65–99)
Potassium: 3.8 mmol/L (ref 3.5–5.1)
Sodium: 139 mmol/L (ref 135–145)
Total Protein: 6.5 g/dL (ref 6.5–8.1)

## 2015-05-07 LAB — TROPONIN I

## 2015-05-07 NOTE — Discharge Instructions (Signed)
Laceration Care, Adult °A laceration is a cut or lesion that goes through all layers of the skin and into the tissue just beneath the skin. °TREATMENT  °Some lacerations may not require closure. Some lacerations may not be able to be closed due to an increased risk of infection. It is important to see your caregiver as soon as possible after an injury to minimize the risk of infection and maximize the opportunity for successful closure. °If closure is appropriate, pain medicines may be given, if needed. The wound will be cleaned to help prevent infection. Your caregiver will use stitches (sutures), staples, wound glue (adhesive), or skin adhesive strips to repair the laceration. These tools bring the skin edges together to allow for faster healing and a better cosmetic outcome. However, all wounds will heal with a scar. Once the wound has healed, scarring can be minimized by covering the wound with sunscreen during the day for 1 full year. °HOME CARE INSTRUCTIONS  °For sutures or staples: °· Keep the wound clean and dry. °· If you were given a bandage (dressing), you should change it at least once a day. Also, change the dressing if it becomes wet or dirty, or as directed by your caregiver. °· Wash the wound with soap and water 2 times a day. Rinse the wound off with water to remove all soap. Pat the wound dry with a clean towel. °· After cleaning, apply a thin layer of the antibiotic ointment as recommended by your caregiver. This will help prevent infection and keep the dressing from sticking. °· You may shower as usual after the first 24 hours. Do not soak the wound in water until the sutures are removed. °· Only take over-the-counter or prescription medicines for pain, discomfort, or fever as directed by your caregiver. °· Get your sutures or staples removed as directed by your caregiver. °For skin adhesive strips: °· Keep the wound clean and dry. °· Do not get the skin adhesive strips wet. You may bathe  carefully, using caution to keep the wound dry. °· If the wound gets wet, pat it dry with a clean towel. °· Skin adhesive strips will fall off on their own. You may trim the strips as the wound heals. Do not remove skin adhesive strips that are still stuck to the wound. They will fall off in time. °For wound adhesive: °· You may briefly wet your wound in the shower or bath. Do not soak or scrub the wound. Do not swim. Avoid periods of heavy perspiration until the skin adhesive has fallen off on its own. After showering or bathing, gently pat the wound dry with a clean towel. °· Do not apply liquid medicine, cream medicine, or ointment medicine to your wound while the skin adhesive is in place. This may loosen the film before your wound is healed. °· If a dressing is placed over the wound, be careful not to apply tape directly over the skin adhesive. This may cause the adhesive to be pulled off before the wound is healed. °· Avoid prolonged exposure to sunlight or tanning lamps while the skin adhesive is in place. Exposure to ultraviolet light in the first year will darken the scar. °· The skin adhesive will usually remain in place for 5 to 10 days, then naturally fall off the skin. Do not pick at the adhesive film. °You may need a tetanus shot if: °· You cannot remember when you had your last tetanus shot. °· You have never had a tetanus   shot. °If you get a tetanus shot, your arm may swell, get red, and feel warm to the touch. This is common and not a problem. If you need a tetanus shot and you choose not to have one, there is a rare chance of getting tetanus. Sickness from tetanus can be serious. °SEEK MEDICAL CARE IF:  °· You have redness, swelling, or increasing pain in the wound. °· You see a red line that goes away from the wound. °· You have yellowish-white fluid (pus) coming from the wound. °· You have a fever. °· You notice a bad smell coming from the wound or dressing. °· Your wound breaks open before or  after sutures have been removed. °· You notice something coming out of the wound such as wood or glass. °· Your wound is on your hand or foot and you cannot move a finger or toe. °SEEK IMMEDIATE MEDICAL CARE IF:  °· Your pain is not controlled with prescribed medicine. °· You have severe swelling around the wound causing pain and numbness or a change in color in your arm, hand, leg, or foot. °· Your wound splits open and starts bleeding. °· You have worsening numbness, weakness, or loss of function of any joint around or beyond the wound. °· You develop painful lumps near the wound or on the skin anywhere on your body. °MAKE SURE YOU:  °· Understand these instructions. °· Will watch your condition. °· Will get help right away if you are not doing well or get worse. °Document Released: 10/05/2005 Document Revised: 12/28/2011 Document Reviewed: 03/31/2011 °[employer]® Patient Information ©2015 [employer]. This information is not intended to replace advice given to you by your health care provider. Make sure you discuss any questions you have with your health care provider. °Fall Prevention and Home Safety °Falls cause injuries and can affect all age groups. It is possible to use preventive measures to significantly decrease the likelihood of falls. There are many simple measures which can make your home safer and prevent falls. °OUTDOORS °· Repair cracks and edges of walkways and driveways. °· Remove high doorway thresholds. °· Trim shrubbery on the main path into your home. °· Have good outside lighting. °· Clear walkways of tools, rocks, debris, and clutter. °· Check that handrails are not broken and are securely fastened. Both sides of steps should have handrails. °· Have leaves, snow, and ice cleared regularly. °· Use sand or salt on walkways during winter months. °· In the garage, clean up grease or oil spills. °BATHROOM °· Install night lights. °· Install grab bars by the toilet and in the tub and  shower. °· Use non-skid mats or decals in the tub or shower. °· Place a plastic non-slip stool in the shower to sit on, if needed. °· Keep floors dry and clean up all water on the floor immediately. °· Remove soap buildup in the tub or shower on a regular basis. °· Secure bath mats with non-slip, double-sided rug tape. °· Remove throw rugs and tripping hazards from the floors. °BEDROOMS °· Install night lights. °· Make sure a bedside light is easy to reach. °· Do not use oversized bedding. °· Keep a telephone by your bedside. °· Have a firm chair with side arms to use for getting dressed. °· Remove throw rugs and tripping hazards from the floor. °KITCHEN °· Keep handles on pots and pans turned toward the center of the stove. Use back burners when possible. °· Clean up spills quickly and allow time   for drying. °· Avoid walking on wet floors. °· Avoid hot utensils and knives. °· Position shelves so they are not too high or low. °· Place commonly used objects within easy reach. °· If necessary, use a sturdy step stool with a grab bar when reaching. °· Keep electrical cables out of the way. °· Do not use floor polish or wax that makes floors slippery. If you must use wax, use non-skid floor wax. °· Remove throw rugs and tripping hazards from the floor. °STAIRWAYS °· Never leave objects on stairs. °· Place handrails on both sides of stairways and use them. Fix any loose handrails. Make sure handrails on both sides of the stairways are as long as the stairs. °· Check carpeting to make sure it is firmly attached along stairs. Make repairs to worn or loose carpet promptly. °· Avoid placing throw rugs at the top or bottom of stairways, or properly secure the rug with carpet tape to prevent slippage. Get rid of throw rugs, if possible. °· Have an electrician put in a light switch at the top and bottom of the stairs. °OTHER FALL PREVENTION TIPS °· Wear low-heel or rubber-soled shoes that are supportive and fit well. Wear  closed toe shoes. °· When using a stepladder, make sure it is fully opened and both spreaders are firmly locked. Do not climb a closed stepladder. °· Add color or contrast paint or tape to grab bars and handrails in your home. Place contrasting color strips on first and last steps. °· Learn and use mobility aids as needed. Install an electrical emergency response system. °· Turn on lights to avoid dark areas. Replace light bulbs that burn out immediately. Get light switches that glow. °· Arrange furniture to create clear pathways. Keep furniture in the same place. °· Firmly attach carpet with non-skid or double-sided tape. °· Eliminate uneven floor surfaces. °· Select a carpet pattern that does not visually hide the edge of steps. °· Be aware of all pets. °OTHER HOME SAFETY TIPS °· Set the water temperature for 120° F (48.8° C). °· Keep emergency numbers on or near the telephone. °· Keep smoke detectors on every level of the home and near sleeping areas. °Document Released: 09/25/2002 Document Revised: 04/05/2012 Document Reviewed: 12/25/2011 °[employer]® Patient Information ©2015 [employer]. This information is not intended to replace advice given to you by your health care provider. Make sure you discuss any questions you have with your health care provider. ° °

## 2015-05-07 NOTE — ED Provider Notes (Signed)
CSN: [id number]     Arrival date & time 05/07/15  0913 History   First MD Initiated Contact with Patient 05/07/15 [phone]     Chief Complaint  Patient presents with  . Loss of Consciousness      HPI Patient reports he was at home this afternoon and had taken the trash out to the curb when he was walking back he had fallen to the ground.  Since her feet tripped or if he passed out.  Larey Seat reports the patient's had multiple falls over the past several months and is currently being worked up by his physicians.  He is told that he has aortic stenosis.  He reports no preceding palpitations or chest pain.  Reports no complaints at this time except for mild pain in his posterior scalp were noted laceration is present.  No active bleeding at this time.  The patient is on anticoagulation with Eliquis.  He lives at home and is independent this time.  His family reports that he usually uses a walker for ambulation outside however today he did not use it as he was taking out the trash.  Family is concerned given the recurrent falls.  Patient reports no nausea vomiting or diarrhea.  Denies recent illness or fevers or chills.   Past Medical History  Diagnosis Date  . Hypertension   . Hyperlipidemia   . Aortic stenosis    History reviewed. No pertinent past surgical history. No family history on file. History  Substance Use Topics  . Smoking status: Former Games developer  . Smokeless tobacco: Never Used  . Alcohol Use: No    Review of Systems  All other systems reviewed and are negative.     Allergies  Review of patient's allergies indicates no known allergies.  Home Medications   Prior to Admission medications   Medication Sig Start Date End Date Taking? Authorizing Provider  acetaminophen (TYLENOL) 325 MG tablet Take 650 mg by mouth every 6 (six) hours as needed.    Historical Provider, MD  apixaban (ELIQUIS) 2.5 MG TABS tablet Take 2.5 mg by mouth 2 (two) times daily.    Historical Provider, MD   CINNAMON PO Take 1 capsule by mouth daily.    Historical Provider, MD  metoprolol succinate (TOPROL-XL) 50 MG 24 hr tablet Take 50 mg by mouth daily. Take with or immediately following a meal.    Historical Provider, MD  simvastatin (ZOCOR) 10 MG tablet Take 10 mg by mouth at bedtime.    Historical Provider, MD   BP 153/64 mmHg  Pulse 65  Temp(Src) 98.5 F (36.9 C) (Oral)  Resp 20  SpO2 94% Physical Exam  Constitutional: He is oriented to person, place, and time. He appears well-developed and well-nourished.  HENT:  Head: Normocephalic.  4.5 cm posterior scalp laceration without active bleeding  Eyes: EOM are normal.  Neck: Neck supple.  Mild cervical and paracervical tenderness without cervical step-offs.  Immobilized in cervical collar  Cardiovascular: Normal rate, regular rhythm, normal heart sounds and intact distal pulses.   Pulmonary/Chest: Effort normal and breath sounds normal. No respiratory distress.  Abdominal: Soft. He exhibits no distension. There is no tenderness.  Musculoskeletal: Normal range of motion.  Full range of motion bilateral knees and ankles and hips.  Full range of motion bilateral wrists, elbows, shoulders  Neurological: He is alert and oriented to person, place, and time.  Skin: Skin is warm and dry.  Psychiatric: He has a normal mood and affect. Judgment normal.  Nursing note and vitals reviewed.   ED Course  Procedures (including critical care time)  LACERATION REPAIR Performed by: Lyanne Co Consent: Verbal consent obtained. Risks and benefits: risks, benefits and alternatives were discussed Patient identity confirmed: provided demographic data Time out performed prior to procedure Prepped and Draped in normal sterile fashion Wound explored Laceration Location: posterior scalp Laceration Length: 4.5cm No Foreign Bodies seen or palpated Anesthesia:none Irrigation method: syringe Amount of cleaning: standard Skin closure:  staple Number of sutures or staples: 6 Technique: staple Patient tolerance: Patient tolerated the procedure well with no immediate complications.   Labs Review Labs Reviewed  CBC WITH DIFFERENTIAL/PLATELET - Abnormal; Notable for the following:    Lymphocytes Relative 11 (*)    Monocytes Absolute 1.1 (*)    All other components within normal limits  COMPREHENSIVE METABOLIC PANEL - Abnormal; Notable for the following:    Glucose, Bld 151 (*)    Total Bilirubin 1.3 (*)    GFR calc non Af Amer 57 (*)    All other components within normal limits  TROPONIN I    Imaging Review Ct Head Wo Contrast  05/07/2015   CLINICAL DATA:  Pain and loss of consciousness following fall  EXAM: CT HEAD WITHOUT CONTRAST  CT CERVICAL SPINE WITHOUT CONTRAST  TECHNIQUE: Multidetector CT imaging of the head and cervical spine was performed following the standard protocol without intravenous contrast. Multiplanar CT image reconstructions of the cervical spine were also generated.  COMPARISON:  March 31, 2015  FINDINGS: CT HEAD FINDINGS  There is generalized ventricular enlargement. There is a lesser degree of sulcal enlargement. The previous focus of hemorrhage layering in the dependent portion of the atrium the left lateral ventricle has resolved. Currently there is no appreciable intraventricular hemorrhage. There is no intra-axial or extra-axial hemorrhage seen currently. There is no mass, extra-axial fluid collection, or midline shift. There is patchy small vessel disease in the centra semiovale bilaterally, stable. There is no acute infarct apparent. There is a left parietal and occipital scalp hematoma. The bony calvarium appears intact. The mastoid air cells are clear.  CT CERVICAL SPINE FINDINGS  There is no fracture or spondylolisthesis. Prevertebral soft tissues and predental space regions are normal. There is moderately severe disc space narrowing at C3-4, C5-6, and C6-7. There is moderate narrowing at C7-T1.  There is facet hypertrophy at multiple levels bilaterally, unchanged. No disc extrusion or stenosis apparent.  There is marked enlargement of the left lobe of the thyroid with mixed attenuation and multiple benign-appearing calcifications. Coarse calcification is also noted in the right lobe of the thyroid. There is apparent debris in the upper thoracic esophagus.  IMPRESSION: CT head: Moderate atrophy with ventricles in proportion larger than sulci. Question concomitant superimposed normal pressure hydrocephalus. The previously noted hemorrhage layering in the atrium of the left lateral ventricle is no longer appreciable. No hemorrhage is currently seen on this study. There is no extra-axial fluid collection or edema. There is small vessel disease in the centra semiovale but no acute appearing infarct. There is a left parietal and occipital scalp hematoma. No fracture evident.  CT cervical spine: Extensive osteoarthritic change, stable. No fracture or spondylolisthesis. Diffuse enlargement of the left lobe of the thyroid with calcification in mixed attenuation. There is also coarse calcification in the right lobe of the thyroid. These findings are consistent with goiter. There is debris in the upper thoracic esophagus. This finding may be indicative of chronic reflux. This patient may be at increased risk of  aspiration given this finding.   Electronically Signed   By: Bretta BangWilliam  Woodruff III M.D.   On: 05/07/2015 12:53   Ct Cervical Spine Wo Contrast  05/07/2015   CLINICAL DATA:  Pain and loss of consciousness following fall  EXAM: CT HEAD WITHOUT CONTRAST  CT CERVICAL SPINE WITHOUT CONTRAST  TECHNIQUE: Multidetector CT imaging of the head and cervical spine was performed following the standard protocol without intravenous contrast. Multiplanar CT image reconstructions of the cervical spine were also generated.  COMPARISON:  March 31, 2015  FINDINGS: CT HEAD FINDINGS  There is generalized ventricular enlargement.  There is a lesser degree of sulcal enlargement. The previous focus of hemorrhage layering in the dependent portion of the atrium the left lateral ventricle has resolved. Currently there is no appreciable intraventricular hemorrhage. There is no intra-axial or extra-axial hemorrhage seen currently. There is no mass, extra-axial fluid collection, or midline shift. There is patchy small vessel disease in the centra semiovale bilaterally, stable. There is no acute infarct apparent. There is a left parietal and occipital scalp hematoma. The bony calvarium appears intact. The mastoid air cells are clear.  CT CERVICAL SPINE FINDINGS  There is no fracture or spondylolisthesis. Prevertebral soft tissues and predental space regions are normal. There is moderately severe disc space narrowing at C3-4, C5-6, and C6-7. There is moderate narrowing at C7-T1. There is facet hypertrophy at multiple levels bilaterally, unchanged. No disc extrusion or stenosis apparent.  There is marked enlargement of the left lobe of the thyroid with mixed attenuation and multiple benign-appearing calcifications. Coarse calcification is also noted in the right lobe of the thyroid. There is apparent debris in the upper thoracic esophagus.  IMPRESSION: CT head: Moderate atrophy with ventricles in proportion larger than sulci. Question concomitant superimposed normal pressure hydrocephalus. The previously noted hemorrhage layering in the atrium of the left lateral ventricle is no longer appreciable. No hemorrhage is currently seen on this study. There is no extra-axial fluid collection or edema. There is small vessel disease in the centra semiovale but no acute appearing infarct. There is a left parietal and occipital scalp hematoma. No fracture evident.  CT cervical spine: Extensive osteoarthritic change, stable. No fracture or spondylolisthesis. Diffuse enlargement of the left lobe of the thyroid with calcification in mixed attenuation. There is also  coarse calcification in the right lobe of the thyroid. These findings are consistent with goiter. There is debris in the upper thoracic esophagus. This finding may be indicative of chronic reflux. This patient may be at increased risk of aspiration given this finding.   Electronically Signed   By: Bretta BangWilliam  Woodruff III M.D.   On: 05/07/2015 12:53  I personally reviewed the imaging tests through PACS system I reviewed available ER/hospitalization records through the EMR    EKG Interpretation   Date/Time:  Tuesday May 07 2015 09:18:16 EDT Ventricular Rate:  67 PR Interval:  241 QRS Duration: 105 QT Interval:  405 QTC Calculation: 427 R Axis:   75 Text Interpretation:  Sinus rhythm Prolonged PR interval inferior ST  changes consistent with prior ecgs Confirmed by Vale Mousseau  MD, Caryn BeeKEVIN ([id number])  on 05/07/2015 1:00:13 PM      MDM   Final diagnoses:  Fall, initial encounter  Syncope, unspecified syncope type  Head injury, initial encounter  Scalp laceration, initial encounter      Laceration repaired.  Infection warnings given.  Head injury warnings given.  Patient with recurrent syncope and falls.  Family is aggressively working this up with  her primary care physician.  Workup.  Emergency department is without significant abnormality.  No indication for additional workup or hospitalization this time.  Some of this may definitely be secondary to aortic stenosis  Azalia Bilis, MD 05/07/15 1331

## 2015-05-07 NOTE — ED Notes (Signed)
EMS - Patient was at home and taking out his garbage when he had syncopal episode.  Patient has a laceration to the posterior head, bleeding is controlled with dressing applied by fire on scene.  Head blocks placed and on spinal board.  EMS cleared patient of spinal and neck deficits.  No neuro deficits.  Patient is on Eloquis.  Alert and oriented.

## 2015-05-07 NOTE — ED Notes (Signed)
This RN attempted 2 IV access on patient with no success.

## 2015-05-08 DIAGNOSIS — W19XXXA Unspecified fall, initial encounter: Secondary | ICD-10-CM | POA: Diagnosis not present

## 2015-05-08 DIAGNOSIS — R42 Dizziness and giddiness: Secondary | ICD-10-CM | POA: Diagnosis not present

## 2015-05-08 DIAGNOSIS — T148 Other injury of unspecified body region: Secondary | ICD-10-CM | POA: Diagnosis not present

## 2015-05-10 ENCOUNTER — Emergency Department (HOSPITAL_COMMUNITY): Payer: Medicare Other

## 2015-05-10 ENCOUNTER — Encounter (HOSPITAL_COMMUNITY): Payer: Self-pay | Admitting: Nurse Practitioner

## 2015-05-10 ENCOUNTER — Emergency Department (HOSPITAL_COMMUNITY)
Admission: EM | Admit: 2015-05-10 | Discharge: 2015-05-10 | Disposition: A | Discharge status: Home / Self Care | Payer: Medicare Other | Attending: Emergency Medicine | Admitting: Emergency Medicine

## 2015-05-10 DIAGNOSIS — Z87828 Personal history of other (healed) physical injury and trauma: Secondary | ICD-10-CM | POA: Insufficient documentation

## 2015-05-10 DIAGNOSIS — R32 Unspecified urinary incontinence: Secondary | ICD-10-CM | POA: Diagnosis present

## 2015-05-10 DIAGNOSIS — S0101XA Laceration without foreign body of scalp, initial encounter: Secondary | ICD-10-CM | POA: Diagnosis not present

## 2015-05-10 DIAGNOSIS — S0101XD Laceration without foreign body of scalp, subsequent encounter: Secondary | ICD-10-CM | POA: Diagnosis not present

## 2015-05-10 DIAGNOSIS — R011 Cardiac murmur, unspecified: Secondary | ICD-10-CM | POA: Diagnosis not present

## 2015-05-10 DIAGNOSIS — Z87891 Personal history of nicotine dependence: Secondary | ICD-10-CM | POA: Insufficient documentation

## 2015-05-10 DIAGNOSIS — R339 Retention of urine, unspecified: Secondary | ICD-10-CM | POA: Diagnosis not present

## 2015-05-10 DIAGNOSIS — Z79899 Other long term (current) drug therapy: Secondary | ICD-10-CM | POA: Diagnosis not present

## 2015-05-10 DIAGNOSIS — Z8673 Personal history of transient ischemic attack (TIA), and cerebral infarction without residual deficits: Secondary | ICD-10-CM | POA: Diagnosis not present

## 2015-05-10 DIAGNOSIS — Z7901 Long term (current) use of anticoagulants: Secondary | ICD-10-CM | POA: Insufficient documentation

## 2015-05-10 DIAGNOSIS — S0990XA Unspecified injury of head, initial encounter: Secondary | ICD-10-CM | POA: Diagnosis not present

## 2015-05-10 DIAGNOSIS — R55 Syncope and collapse: Secondary | ICD-10-CM | POA: Diagnosis not present

## 2015-05-10 DIAGNOSIS — R350 Frequency of micturition: Secondary | ICD-10-CM | POA: Insufficient documentation

## 2015-05-10 DIAGNOSIS — I10 Essential (primary) hypertension: Secondary | ICD-10-CM | POA: Insufficient documentation

## 2015-05-10 DIAGNOSIS — Z9181 History of falling: Secondary | ICD-10-CM | POA: Diagnosis not present

## 2015-05-10 DIAGNOSIS — E785 Hyperlipidemia, unspecified: Secondary | ICD-10-CM | POA: Diagnosis not present

## 2015-05-10 DIAGNOSIS — R42 Dizziness and giddiness: Secondary | ICD-10-CM

## 2015-05-10 DIAGNOSIS — I480 Paroxysmal atrial fibrillation: Secondary | ICD-10-CM | POA: Diagnosis not present

## 2015-05-10 LAB — COMPREHENSIVE METABOLIC PANEL
ALT: 18 U/L (ref 17–63)
AST: 26 U/L (ref 15–41)
Albumin: 3.4 g/dL — ABNORMAL LOW (ref 3.5–5.0)
Alkaline Phosphatase: 84 U/L (ref 38–126)
Anion gap: 12 (ref 5–15)
BILIRUBIN TOTAL: 1.6 mg/dL — AB (ref 0.3–1.2)
BUN: 19 mg/dL (ref 6–20)
CO2: 24 mmol/L (ref 22–32)
CREATININE: 1.11 mg/dL (ref 0.61–1.24)
Calcium: 9 mg/dL (ref 8.9–10.3)
Chloride: 99 mmol/L — ABNORMAL LOW (ref 101–111)
GFR calc Af Amer: >60 mL/min (ref >60)
GFR calc non Af Amer: 57 mL/min — ABNORMAL LOW (ref >60)
Glucose, Bld: 112 mg/dL — ABNORMAL HIGH (ref 65–99)
Potassium: 4.1 mmol/L (ref 3.5–5.1)
Sodium: 135 mmol/L (ref 135–145)
Total Protein: 6.8 g/dL (ref 6.5–8.1)

## 2015-05-10 LAB — URINALYSIS, ROUTINE W REFLEX MICROSCOPIC
Bilirubin Urine: NEGATIVE
GLUCOSE, UA: NEGATIVE mg/dL
HGB URINE DIPSTICK: NEGATIVE
Ketones, ur: NEGATIVE mg/dL
Leukocytes, UA: NEGATIVE
Nitrite: NEGATIVE
Protein, ur: NEGATIVE mg/dL
Specific Gravity, Urine: 1.016 (ref 1.005–1.030)
Urobilinogen, UA: 1 mg/dL (ref 0.0–1.0)
pH: 5.5 (ref 5.0–8.0)

## 2015-05-10 LAB — CBC WITH DIFFERENTIAL/PLATELET
Basophils Absolute: 0 10*3/uL (ref 0.0–0.1)
Basophils Relative: 0 % (ref 0–1)
Eosinophils Absolute: 0.3 10*3/uL (ref 0.0–0.7)
Eosinophils Relative: 3 % (ref 0–5)
HEMATOCRIT: 33.9 % — AB (ref 39.0–52.0)
Hemoglobin: 11.3 g/dL — ABNORMAL LOW (ref 13.0–17.0)
LYMPHS ABS: 1.7 10*3/uL (ref 0.7–4.0)
Lymphocytes Relative: 16 % (ref 12–46)
MCH: 29.7 pg (ref 26.0–34.0)
MCHC: 33.3 g/dL (ref 30.0–36.0)
MCV: 89.2 fL (ref 78.0–100.0)
MONO ABS: 1.9 10*3/uL — AB (ref 0.1–1.0)
Monocytes Relative: 18 % — ABNORMAL HIGH (ref 3–12)
NEUTROS ABS: 6.7 10*3/uL (ref 1.7–7.7)
NEUTROS PCT: 63 % (ref 43–77)
Platelets: 267 10*3/uL (ref 150–400)
RBC: 3.8 MIL/uL — ABNORMAL LOW (ref 4.22–5.81)
RDW: 14 % (ref 11.5–15.5)
WBC: 10.5 10*3/uL (ref 4.0–10.5)

## 2015-05-10 LAB — POC OCCULT BLOOD, ED: Fecal Occult Bld: NEGATIVE

## 2015-05-10 NOTE — ED Notes (Addendum)
He was here for a head injury on Tuesday. He fell again at home Wednesday but did not seek treatment, he states he scraped his head on the back of a recliner during the fall Wednesday. He c/o constant dizziness since yesterday. He takes eliquis. He denies pain, LOC He is A&Ox4, resp e/u. He also reports difficulty urinating yesterday evening,and an episode of urinary incontinence during his sleep last night.

## 2015-05-10 NOTE — Discharge Instructions (Signed)
Dizziness Dizziness is a common problem. It is a feeling of unsteadiness or light-headedness. You may feel like you are about to faint. Dizziness can lead to injury if you stumble or fall. A person of any age group can suffer from dizziness, but dizziness is more common in older adults. CAUSES  Dizziness can be caused by many different things, including:  Middle ear problems.  Standing for too long.  Infections.  An allergic reaction.  Aging.  An emotional response to something, such as the sight of blood.  Side effects of medicines.  Tiredness.  Problems with circulation or blood pressure.  Excessive use of alcohol or medicines, or illegal drug use.  Breathing too fast (hyperventilation).  An irregular heart rhythm (arrhythmia).  A low red blood cell count (anemia).  Pregnancy.  Vomiting, diarrhea, fever, or other illnesses that cause body fluid loss (dehydration).  Diseases or conditions such as Parkinson's disease, high blood pressure (hypertension), diabetes, and thyroid problems.  Exposure to extreme heat. DIAGNOSIS  Your health care provider will ask about your symptoms, perform a physical exam, and perform an electrocardiogram (ECG) to record the electrical activity of your heart. Your health care provider may also perform other heart or blood tests to determine the cause of your dizziness. These may include: 1. Transthoracic echocardiogram (TTE). During echocardiography, sound waves are used to evaluate how blood flows through your heart. 2. Transesophageal echocardiogram (TEE). 3. Cardiac monitoring. This allows your health care provider to monitor your heart rate and rhythm in real time. 4. Holter monitor. This is a portable device that records your heartbeat and can help diagnose heart arrhythmias. It allows your health care provider to track your heart activity for several days if needed. 5. Stress tests by exercise or by giving medicine that makes the heart  beat faster. TREATMENT  Treatment of dizziness depends on the cause of your symptoms and can vary greatly. HOME CARE INSTRUCTIONS  1. Drink enough fluids to keep your urine clear or pale yellow. This is especially important in very hot weather. In older adults, it is also important in cold weather. 2. Take your medicine exactly as directed if your dizziness is caused by medicines. When taking blood pressure medicines, it is especially important to get up slowly. 1. Rise slowly from chairs and steady yourself until you feel okay. 2. In the morning, first sit up on the side of the bed. When you feel okay, stand slowly while holding onto something until you know your balance is fine. 3. Move your legs often if you need to stand in one place for a long time. Tighten and relax your muscles in your legs while standing. 4. Have someone stay with you for 1-2 days if dizziness continues to be a problem. Do this until you feel you are well enough to stay alone. Have the person call your health care provider if he or she notices changes in you that are concerning. 5. Do not drive or use heavy machinery if you feel dizzy. 6. Do not drink alcohol. SEEK IMMEDIATE MEDICAL CARE IF:  1. Your dizziness or light-headedness gets worse. 2. You feel nauseous or vomit. 3. You have problems talking, walking, or using your arms, hands, or legs. 4. You feel weak. 5. You are not thinking clearly or you have trouble forming sentences. It may take a friend or family member to notice this. 6. You have chest pain, abdominal pain, shortness of breath, or sweating. 7. Your vision changes. 8. You notice  any bleeding. 9. You have side effects from medicine that seems to be getting worse rather than better. MAKE SURE YOU:  1. Understand these instructions. 2. Will watch your condition. 3. Will get help right away if you are not doing well or get worse. Document Released: 03/31/2001 Document Revised: 10/10/2013 Document  Reviewed: 04/24/2011 [facility] Patient Information 2015 [place], [place]. This information is not intended to replace advice given to you by your health care provider. Make sure you discuss any questions you have with your health care provider.    Acute Urinary Retention Acute urinary retention is the temporary inability to urinate. This is a common problem in older men. As men age their prostates become larger and block the flow of urine from the bladder. This is usually a problem that has come on gradually.  HOME CARE INSTRUCTIONS If you are sent home with a Foley catheter and a drainage system, you will need to discuss the best course of action with your health care provider. While the catheter is in, maintain a good intake of fluids. Keep the drainage bag emptied and lower than your catheter. This is so that contaminated urine will not flow back into your bladder, which could lead to a urinary tract infection. There are two main types of drainage bags. One is a large bag that usually is used at night. It has a good capacity that will allow you to sleep through the night without having to empty it. The second type is called a leg bag. It has a smaller capacity, so it needs to be emptied more frequently. However, the main advantage is that it can be attached by a leg strap and can go underneath your clothing, allowing you the freedom to move about or leave your home. Only take over-the-counter or prescription medicines for pain, discomfort, or fever as directed by your health care provider.  SEEK MEDICAL CARE IF:  You develop a low-grade fever.  You experience spasms or leakage of urine with the spasms. SEEK IMMEDIATE MEDICAL CARE IF:  6. You develop chills or fever. 7. Your catheter stops draining urine. 8. Your catheter falls out. 9. You start to develop increased bleeding that does not respond to rest and increased fluid intake. MAKE SURE YOU: 7. Understand these instructions. 8. Will  watch your condition. 9. Will get help right away if you are not doing well or get worse. Document Released: 01/11/2001 Document Revised: 10/10/2013 Document Reviewed: 03/16/2013 [facility] Patient Information 2015 [place], [place]. This information is not intended to replace advice given to you by your health care provider. Make sure you discuss any questions you have with your health care provider.   Foley Catheter Care A Foley catheter is a soft, flexible tube that is placed into the bladder to drain urine. A Foley catheter may be inserted if:  You leak urine or are not able to control when you urinate (urinary incontinence).  You are not able to urinate when you need to (urinary retention).  You had prostate surgery or surgery on the genitals.  You have certain medical conditions, such as multiple sclerosis, dementia, or a spinal cord injury. If you are going home with a Foley catheter in place, follow the instructions below. TAKING CARE OF THE CATHETER 10. Wash your hands with soap and water. 11. Using mild soap and warm water on a clean washcloth:  Clean the area on your body closest to the catheter insertion site using a circular motion, moving away from the catheter.  Never wipe toward the catheter because this could sweep bacteria up into the urethra and cause infection.  Remove all traces of soap. Pat the area dry with a clean towel. For males, reposition the foreskin. 12. Attach the catheter to your leg so there is no tension on the catheter. Use adhesive tape or a leg strap. If you are using adhesive tape, remove any sticky residue left behind by the previous tape you used. 13. Keep the drainage bag below the level of the bladder, but keep it off the floor. 14. Check throughout the day to be sure the catheter is working and urine is draining freely. Make sure the tubing does not become kinked. 15. Do not pull on the catheter or try to remove it. Pulling could damage internal  tissues. TAKING CARE OF THE DRAINAGE BAGS You will be given two drainage bags to take home. One is a large overnight drainage bag, and the other is a smaller leg bag that fits underneath clothing. You may wear the overnight bag at any time, but you should never wear the smaller leg bag at night. Follow the instructions below for how to empty, change, and clean your drainage bags. Emptying the Drainage Bag You must empty your drainage bag when it is  - full or at least 2-3 times a day. 10. Wash your hands with soap and water. 11. Keep the drainage bag below your hips, below the level of your bladder. This stops urine from going back into the tubing and into your bladder. 12. Hold the dirty bag over the toilet or a clean container. 13. Open the pour spout at the bottom of the bag and empty the urine into the toilet or container. Do not let the pour spout touch the toilet, container, or any other surface. Doing so can place bacteria on the bag, which can cause an infection. 14. Clean the pour spout with a gauze pad or cotton ball that has rubbing alcohol on it. 15. Close the pour spout. 16. Attach the bag to your leg with adhesive tape or a leg strap. 17. Wash your hands well. Changing the Drainage Bag Change your drainage bag once a month or sooner if it starts to smell bad or look dirty. Below are steps to follow when changing the drainage bag. 10. Wash your hands with soap and water. 11. Pinch off the rubber catheter so that urine does not spill out. 12. Disconnect the catheter tube from the drainage tube at the connection valve. Do not let the tubes touch any surface. 13. Clean the end of the catheter tube with an alcohol wipe. Use a different alcohol wipe to clean the end of the drainage tube. 14. Connect the catheter tube to the drainage tube of the clean drainage bag. 15. Attach the new bag to the leg with adhesive tape or a leg strap. Avoid attaching the new bag too tightly. 16. Wash your  hands well. Cleaning the Drainage Bag 4. Wash your hands with soap and water. 5. Wash the bag in warm, soapy water. 6. Rinse the bag thoroughly with warm water. 7. Fill the bag with a solution of white vinegar and water (1 cup vinegar to 1 qt warm water [.2 L vinegar to 1 L warm water]). Close the bag and soak it for 30 minutes in the solution. 8. Rinse the bag with warm water. 9. Hang the bag to dry with the pour spout open and hanging downward. 10. Store the clean bag (once  it is dry) in a clean plastic bag. 11. Wash your hands well. PREVENTING INFECTION  Wash your hands before and after handling your catheter.  Take showers daily and wash the area where the catheter enters your body. Do not take baths. Replace wet leg straps with dry ones, if this applies.  Do not use powders, sprays, or lotions on the genital area. Only use creams, lotions, or ointments as directed by your caregiver.  For females, wipe from front to back after each bowel movement.  Drink enough fluids to keep your urine clear or pale yellow unless you have a fluid restriction.  Do not let the drainage bag or tubing touch or lie on the floor.  Wear cotton underwear to absorb moisture and to keep your skin drier. SEEK MEDICAL CARE IF:   Your urine is cloudy or smells unusually bad.  Your catheter becomes clogged.  You are not draining urine into the bag or your bladder feels full.  Your catheter starts to leak. SEEK IMMEDIATE MEDICAL CARE IF:   You have pain, swelling, redness, or pus where the catheter enters the body.  You have pain in the abdomen, legs, lower back, or bladder.  You have a fever.  You see blood fill the catheter, or your urine is pink or red.  You have nausea, vomiting, or chills.  Your catheter gets pulled out. MAKE SURE YOU:   Understand these instructions.  Will watch your condition.  Will get help right away if you are not doing well or get worse. Document Released:  10/05/2005 Document Revised: 02/19/2014 Document Reviewed: 09/26/2012 [facility] Patient Information 2015 [place], [place]. This information is not intended to replace advice given to you by your health care provider. Make sure you discuss any questions you have with your health care provider.

## 2015-05-10 NOTE — ED Provider Notes (Signed)
CSN: [id number]     Arrival date & time 05/10/15  1444 History   First MD Initiated Contact with Patient 05/10/15 1535     Chief Complaint  Patient presents with  . Dizziness  . Urinary Incontinence     (Consider location/radiation/quality/duration/timing/severity/associated sxs/prior Treatment) HPI  81 year old male presents with dizziness on and off since waking up this AM. Patient fell with occipital head injury requiring staples 3 days ago. Larey Seat again next day and "grazed" his head. Denies LOC. Tripped over the sofa. Since then has been doing well. Last night however he got up to urinate but couldn't. Woke up a few hours later and had gone on himself without knowing. New for patient. Frequently urinating small amounts today, no dysuria. No neck or back pain. No headache. Dizziness is both a room spinning sensation and like he's going to pass out. Lasts a few seconds, mostly when he sits up or stands up. Once he gets settled it goes away. No CP or dyspnea. No palpitations. No weakness.   Past Medical History  Diagnosis Date  . Hypertension   . Hyperlipidemia   . Aortic stenosis    History reviewed. No pertinent past surgical history. History reviewed. No pertinent family history. History  Substance Use Topics  . Smoking status: Former Games developer  . Smokeless tobacco: Never Used  . Alcohol Use: No    Review of Systems  Constitutional: Negative for fever.  Respiratory: Negative for shortness of breath.   Cardiovascular: Negative for chest pain.  Gastrointestinal: Negative for vomiting and abdominal pain.  Genitourinary: Positive for frequency. Negative for dysuria.  Musculoskeletal: Negative for back pain and neck pain.  Neurological: Positive for dizziness and light-headedness. Negative for weakness, numbness and headaches.  Psychiatric/Behavioral: Negative for confusion.  All other systems reviewed and are negative.     Allergies  Review of patient's allergies indicates no  known allergies.  Home Medications   Prior to Admission medications   Medication Sig Start Date End Date Taking? Authorizing Provider  acetaminophen (TYLENOL) 325 MG tablet Take 650 mg by mouth every 6 (six) hours as needed (pain).     Historical Provider, MD  apixaban (ELIQUIS) 2.5 MG TABS tablet Take 2.5 mg by mouth 2 (two) times daily.    Historical Provider, MD  CINNAMON PO Take 1 capsule by mouth daily.    Historical Provider, MD  metoprolol succinate (TOPROL-XL) 50 MG 24 hr tablet Take 50 mg by mouth daily. Take with or immediately following a meal.    Historical Provider, MD  simvastatin (ZOCOR) 10 MG tablet Take 10 mg by mouth at bedtime.    Historical Provider, MD   BP 147/74 mmHg  Pulse 87  Temp(Src) 99.2 F (37.3 C) (Oral)  Resp 16  SpO2 95% Physical Exam  Constitutional: He is oriented to person, place, and time. He appears well-developed and well-nourished.  HENT:  Head: Normocephalic.    Right Ear: External ear normal.  Left Ear: External ear normal.  Nose: Nose normal.  Eyes: Right eye exhibits no discharge. Left eye exhibits no discharge.  Neck: Neck supple.  Cardiovascular: Normal rate, regular rhythm and intact distal pulses.   Murmur heard. Pulmonary/Chest: Effort normal.  Abdominal: Soft. There is no tenderness.  Musculoskeletal: He exhibits no edema.  Neurological: He is alert and oriented to person, place, and time.  CN 2-12 grossly intact. 5/5 strength in all 4 extremities. Normal finger to nose  Skin: Skin is warm and dry.  Nursing note  and vitals reviewed.   ED Course  Procedures (including critical care time) Labs Review Labs Reviewed  COMPREHENSIVE METABOLIC PANEL - Abnormal; Notable for the following:    Chloride 99 (*)    Glucose, Bld 112 (*)    Albumin 3.4 (*)    Total Bilirubin 1.6 (*)    GFR calc non Af Amer 57 (*)    All other components within normal limits  CBC WITH DIFFERENTIAL/PLATELET - Abnormal; Notable for the following:     RBC 3.80 (*)    Hemoglobin 11.3 (*)    HCT 33.9 (*)    Monocytes Relative 18 (*)    Monocytes Absolute 1.9 (*)    All other components within normal limits  URINALYSIS, ROUTINE W REFLEX MICROSCOPIC (NOT AT [facility])  POC OCCULT BLOOD, ED    Imaging Review Ct Head Wo Contrast  05/10/2015   CLINICAL DATA:  Syncope, fall, occipital head trauma and scalp laceration  EXAM: CT HEAD WITHOUT CONTRAST  TECHNIQUE: Contiguous axial images were obtained from the base of the skull through the vertex without intravenous contrast.  COMPARISON:  05/07/2015  FINDINGS: Mild cortical volume loss noted with proportional ventricular prominence. Areas of periventricular white matter hypodensity are most compatible with small vessel ischemic change. No acute hemorrhage, infarct, or mass lesion is identified. Left occipital scalp laceration noted with skin staples in place. No underlying skull fracture. Mild ethmoid mucoperiosteal thickening.  IMPRESSION: Left occipital scalp laceration without underlying acute intracranial finding.   Electronically Signed   By: Christiana Pellant M.D.   On: 05/10/2015 17:23     EKG Interpretation   Date/Time:  Friday May 10 2015 16:10:28 EDT Ventricular Rate:  83 PR Interval:  224 QRS Duration: 104 QT Interval:  377 QTC Calculation: 443 R Axis:   66 Text Interpretation:  Sinus rhythm Prolonged PR interval Anterior infarct,  old Minimal ST elevation, inferior leads no significant change since 3  days ago, including inferior Confirmed by Jolynn Bajorek  MD, Cecille Mcclusky ([id number]) on  05/10/2015 4:15:04 PM      MDM   Final diagnoses:  Urinary retention  Dizziness    Patient's urinary symptoms are likely from urinary retention as he had over 300 mL of urine in his bladder after his most recent void. No evidence of UTI. Patient has had extensive workup for syncope in past. Has had a drop in hemoglobin from 14 to 11 over several days, likely this is equilibration after blood loss from scalp  wound from fall. No other signs of bleeding and negative hemoccult. No chest pain. Discussed options with patient and family, they prefer not to be admitted for syncope/nearsyncope workup and would rather go home with close outpatient f/u. Given extensive workup in past I feel this is reasonable. Foley placed for retention, will give urology f/u. Able to ambulate, somewhat unsteady but usually walks with walker and has family with him 24/7. Discussed strict return precautions.    Pricilla Loveless, MD 05/11/15 [phone]

## 2015-05-10 NOTE — ED Notes (Signed)
Provided follow up teaching and cathater care with patient for home use. Instructions also included in discharge paperwork.

## 2015-05-13 DIAGNOSIS — I10 Essential (primary) hypertension: Secondary | ICD-10-CM | POA: Diagnosis not present

## 2015-05-13 DIAGNOSIS — Z7901 Long term (current) use of anticoagulants: Secondary | ICD-10-CM | POA: Diagnosis not present

## 2015-05-13 DIAGNOSIS — S0101XD Laceration without foreign body of scalp, subsequent encounter: Secondary | ICD-10-CM | POA: Diagnosis not present

## 2015-05-13 DIAGNOSIS — I480 Paroxysmal atrial fibrillation: Secondary | ICD-10-CM | POA: Diagnosis not present

## 2015-05-13 DIAGNOSIS — R42 Dizziness and giddiness: Secondary | ICD-10-CM | POA: Diagnosis not present

## 2015-05-13 DIAGNOSIS — Z8673 Personal history of transient ischemic attack (TIA), and cerebral infarction without residual deficits: Secondary | ICD-10-CM | POA: Diagnosis not present

## 2015-05-14 DIAGNOSIS — Z111 Encounter for screening for respiratory tuberculosis: Secondary | ICD-10-CM | POA: Diagnosis not present

## 2015-05-16 DIAGNOSIS — Z8673 Personal history of transient ischemic attack (TIA), and cerebral infarction without residual deficits: Secondary | ICD-10-CM | POA: Diagnosis not present

## 2015-05-16 DIAGNOSIS — I10 Essential (primary) hypertension: Secondary | ICD-10-CM | POA: Diagnosis not present

## 2015-05-16 DIAGNOSIS — Z7901 Long term (current) use of anticoagulants: Secondary | ICD-10-CM | POA: Diagnosis not present

## 2015-05-16 DIAGNOSIS — R3912 Poor urinary stream: Secondary | ICD-10-CM | POA: Diagnosis not present

## 2015-05-16 DIAGNOSIS — R3915 Urgency of urination: Secondary | ICD-10-CM | POA: Diagnosis not present

## 2015-05-16 DIAGNOSIS — N401 Enlarged prostate with lower urinary tract symptoms: Secondary | ICD-10-CM | POA: Diagnosis not present

## 2015-05-16 DIAGNOSIS — S0101XD Laceration without foreign body of scalp, subsequent encounter: Secondary | ICD-10-CM | POA: Diagnosis not present

## 2015-05-16 DIAGNOSIS — I480 Paroxysmal atrial fibrillation: Secondary | ICD-10-CM | POA: Diagnosis not present

## 2015-05-16 DIAGNOSIS — R42 Dizziness and giddiness: Secondary | ICD-10-CM | POA: Diagnosis not present

## 2015-05-16 DIAGNOSIS — R339 Retention of urine, unspecified: Secondary | ICD-10-CM | POA: Diagnosis not present

## 2015-05-17 DIAGNOSIS — W19XXXA Unspecified fall, initial encounter: Secondary | ICD-10-CM | POA: Diagnosis not present

## 2015-05-17 DIAGNOSIS — S0101XD Laceration without foreign body of scalp, subsequent encounter: Secondary | ICD-10-CM | POA: Diagnosis not present

## 2015-05-17 DIAGNOSIS — Z4802 Encounter for removal of sutures: Secondary | ICD-10-CM | POA: Diagnosis not present

## 2015-05-17 DIAGNOSIS — R32 Unspecified urinary incontinence: Secondary | ICD-10-CM | POA: Diagnosis not present

## 2015-05-17 DIAGNOSIS — Z8673 Personal history of transient ischemic attack (TIA), and cerebral infarction without residual deficits: Secondary | ICD-10-CM | POA: Diagnosis not present

## 2015-05-17 DIAGNOSIS — I480 Paroxysmal atrial fibrillation: Secondary | ICD-10-CM | POA: Diagnosis not present

## 2015-05-17 DIAGNOSIS — Z9889 Other specified postprocedural states: Secondary | ICD-10-CM | POA: Diagnosis not present

## 2015-05-17 DIAGNOSIS — Z7901 Long term (current) use of anticoagulants: Secondary | ICD-10-CM | POA: Diagnosis not present

## 2015-05-17 DIAGNOSIS — R42 Dizziness and giddiness: Secondary | ICD-10-CM | POA: Diagnosis not present

## 2015-05-17 DIAGNOSIS — I10 Essential (primary) hypertension: Secondary | ICD-10-CM | POA: Diagnosis not present

## 2015-05-20 DIAGNOSIS — Z7901 Long term (current) use of anticoagulants: Secondary | ICD-10-CM | POA: Diagnosis not present

## 2015-05-20 DIAGNOSIS — R42 Dizziness and giddiness: Secondary | ICD-10-CM | POA: Diagnosis not present

## 2015-05-20 DIAGNOSIS — S0101XD Laceration without foreign body of scalp, subsequent encounter: Secondary | ICD-10-CM | POA: Diagnosis not present

## 2015-05-20 DIAGNOSIS — I10 Essential (primary) hypertension: Secondary | ICD-10-CM | POA: Diagnosis not present

## 2015-05-20 DIAGNOSIS — Z8673 Personal history of transient ischemic attack (TIA), and cerebral infarction without residual deficits: Secondary | ICD-10-CM | POA: Diagnosis not present

## 2015-05-20 DIAGNOSIS — I480 Paroxysmal atrial fibrillation: Secondary | ICD-10-CM | POA: Diagnosis not present

## 2015-05-23 DIAGNOSIS — S0101XD Laceration without foreign body of scalp, subsequent encounter: Secondary | ICD-10-CM | POA: Diagnosis not present

## 2015-05-23 DIAGNOSIS — Z7901 Long term (current) use of anticoagulants: Secondary | ICD-10-CM | POA: Diagnosis not present

## 2015-05-23 DIAGNOSIS — R42 Dizziness and giddiness: Secondary | ICD-10-CM | POA: Diagnosis not present

## 2015-05-23 DIAGNOSIS — I10 Essential (primary) hypertension: Secondary | ICD-10-CM | POA: Diagnosis not present

## 2015-05-23 DIAGNOSIS — I480 Paroxysmal atrial fibrillation: Secondary | ICD-10-CM | POA: Diagnosis not present

## 2015-05-23 DIAGNOSIS — Z8673 Personal history of transient ischemic attack (TIA), and cerebral infarction without residual deficits: Secondary | ICD-10-CM | POA: Diagnosis not present

## 2015-05-29 ENCOUNTER — Encounter: Payer: Self-pay | Admitting: Neurology

## 2015-05-29 ENCOUNTER — Ambulatory Visit (INDEPENDENT_AMBULATORY_CARE_PROVIDER_SITE_OTHER): Payer: Medicare Other | Admitting: Neurology

## 2015-05-29 VITALS — BP 153/79 | HR 82 | Temp 97.7°F | Ht 69.0 in

## 2015-05-29 DIAGNOSIS — R531 Weakness: Secondary | ICD-10-CM | POA: Diagnosis not present

## 2015-05-29 DIAGNOSIS — R5383 Other fatigue: Secondary | ICD-10-CM

## 2015-05-29 DIAGNOSIS — H8149 Vertigo of central origin, unspecified ear: Secondary | ICD-10-CM

## 2015-05-29 DIAGNOSIS — W19XXXA Unspecified fall, initial encounter: Secondary | ICD-10-CM

## 2015-05-29 DIAGNOSIS — I619 Nontraumatic intracerebral hemorrhage, unspecified: Secondary | ICD-10-CM

## 2015-05-29 DIAGNOSIS — R4020 Unspecified coma: Secondary | ICD-10-CM | POA: Insufficient documentation

## 2015-05-29 DIAGNOSIS — M6281 Muscle weakness (generalized): Secondary | ICD-10-CM

## 2015-05-29 DIAGNOSIS — R55 Syncope and collapse: Secondary | ICD-10-CM | POA: Diagnosis not present

## 2015-05-29 DIAGNOSIS — R270 Ataxia, unspecified: Secondary | ICD-10-CM | POA: Insufficient documentation

## 2015-05-29 DIAGNOSIS — E538 Deficiency of other specified B group vitamins: Secondary | ICD-10-CM | POA: Diagnosis not present

## 2015-05-29 DIAGNOSIS — M6289 Other specified disorders of muscle: Secondary | ICD-10-CM

## 2015-05-29 DIAGNOSIS — R404 Transient alteration of awareness: Secondary | ICD-10-CM | POA: Diagnosis not present

## 2015-05-29 DIAGNOSIS — G459 Transient cerebral ischemic attack, unspecified: Secondary | ICD-10-CM | POA: Diagnosis not present

## 2015-05-29 DIAGNOSIS — R42 Dizziness and giddiness: Secondary | ICD-10-CM | POA: Diagnosis not present

## 2015-05-29 DIAGNOSIS — H814 Vertigo of central origin: Secondary | ICD-10-CM | POA: Insufficient documentation

## 2015-05-29 HISTORY — DX: Nontraumatic intracerebral hemorrhage, unspecified: I61.9

## 2015-05-29 HISTORY — DX: Ataxia, unspecified: R27.0

## 2015-05-29 HISTORY — DX: Weakness: R53.1

## 2015-05-29 NOTE — Patient Instructions (Signed)
As far as diagnostic testing: imaging of the brain, labwork, emg/ncs, eeg  I would like to see you back in 4 weeks for follow up appointment, sooner for emg/ncs, sooner if we need to. Please call us with any interim questions, concerns, problems, updates or refill requests.   Please also call us for any test results so we can go over those with you on the phone.  My clinical assistant and will answer any of your questions and relay your messages to me and also relay most of my messages to you.   Our phone number is [phone]. We also have an after hours call service for urgent matters and there is a physician on-call for urgent questions. For any emergencies you know to call 911 or go to the nearest emergency room

## 2015-05-29 NOTE — Progress Notes (Signed)
[facility]    Provider:  Dr Lucia Gaskins Referring Provider: Gildardo Cranker, MD Primary Care Physician:   Duane Lope, MD  CC:  Confusion, falls, loss of consciousness, dizziness, hemorrhagic stroke,weakness  HPI:  Seth [name] is a 81 y.o. male here as a referral from Dr. Tenny Craw for dizziness, concussion. Past medical history of hypertension, dyslipidemia, atrial fibrillation (on AC with Eliquis), possibly aortic stenosis. He has had 9 falls. 2 of them took him to the emergency room. He has been having falls for about a year. He loses consciousness and finds himself on the ground. No weakness, not tripping over his legs. But he have bad kness which affects walking and they are sure it contributes to the early falls. He doesn't remember much at all from the falls. He remembers the second fall, he was taking the trash out and he just lost consciousness. He woke up and 2 people from the neighborhood was there. They called 911. Unclear if he was confused but patient denies any confusion, knew where he was and that he had fallen. Hard to know if he urinated on himself, but possible. No tongue biting. No episodes of altered consciousness. No memory loss, his mind is sharp per his three children providing most of the information here today. He has been experiencing dizziness since the second fall. Feels like his head is spinning. He feels like he is falling back. No worsening on movement of head. Dizziness worse with standing or sitting. Once he is stabilized in a position it goes away. But also happens when he is just sitting down. Continuous. All the time. No headache. Denies double vision, ptosis, SOB, dysphagia,dysarthria.Weakness over the last 1 year with a decline over the last 2 months since the falls.   Reviewed notes, labs and imaging from outside physicians, which showed; Patient was seen in June for an unwitnessed fall. He was found laying on his back with head on concrete and  laceration. He reported walking and then found himself on the ground. He was back to baseline in the ED. His CT head showed small amount of hemorrhage layering dependently in the occipital horn of the left lateral ventricle. ED physician contacted neurosurgery and Dr. Yetta Barre on call said pt can continue Eliquis and no need to repeat the scan unless pt's neurologic status changes. ECHO shows normal systolic function with an estimated EF of 55-60%. Results consistent with abnormal left ventricular relaxation (grade 1 dystolic dysfunction). Aortic valve moderately calcified. He was seen again in July with a syncopal episode and taken to the ED, he was taking his garbage out. He has been told that he has Aortic Stenosis.   Personally reviewed all images:  Head CT 03/30/1025: Small amount of hemorrhage layering dependently in the occipital horn of the left lateral ventricle. No evidence of more extensive subarachnoid hemorrhage. Presumably this is a small amount of traumatic hemorrhage. Atrophy and chronic small-vessel disease elsewhere. Left parietal scalp hematoma without underlying skull racture.  Cervical spine CT 7/19: No acute or traumatic finding. Chronic degenerative changes. Left-sided thyroid goiter.  CT head 7/19: Moderate atrophy with ventricles in proportion larger than sulci. Question concomitant superimposed normal pressure hydrocephalus. The previously noted hemorrhage layering in the atrium of the left lateral ventricle is no longer appreciable. No hemorrhage is currently seen on this study. There is no extra-axial fluid collection or edema. There is small vessel disease in the centra semiovale but no acute appearing infarct. There is a left parietal and occipital  scalp hematoma. No fracture evident.  CT cervical spine 7/19: Extensive osteoarthritic change, stable. No fracture or spondylolisthesis. Diffuse enlargement of the left lobe of the thyroid with calcification in mixed attenuation.  There is also coarse calcification in the right lobe of the thyroid. These findings are consistent with goiter. There is debris in the upper thoracic esophagus. This finding may be indicative of chronic reflux. This patient may be at increased risk of aspiration given this finding.  CT head 7/22: Mild cortical volume loss noted with proportional ventricular prominence. Areas of periventricular white matter hypodensity are most compatible with small vessel ischemic change. No acute hemorrhage, infarct, or mass lesion is identified. Left occipital scalp laceration noted with skin staples in place. No underlying skull fracture. Mild ethmoid mucoperiosteal thickening.  IMPRESSION: Left occipital scalp laceration without underlying acute intracranial finding.  CT head 05/30/2015: IMPRESSION: 1. No evidence of traumatic intracranial injury or fracture. 2. Soft tissue injury at the occiput, with associated laceration. 3. Moderate cortical volume loss and scattered small vessel ischemic microangiopathy. 4. Chronic lacunar infarct at the right cerebellar hemisphere.    Review of Systems: Patient complains of symptoms per HPI as well as the following symptoms: easy bruising, spinning sensation, urination roblems, incontinence, blood in urine, easy bruising, confusion, weakness, dizziness, passing out, depression, change in appetite, disinterest in activities. Pertinent negatives per HPI. All others negative.   Social History   Social History  . Marital Status: Widowed    Spouse Name: N/A  . Number of Children: 4  . Years of Education: 16   Occupational History  . Retired     Social History Main Topics  . Smoking status: Former Games developer  . Smokeless tobacco: Never Used  . Alcohol Use: No  . Drug Use: No  . Sexual Activity: Not on file   Other Topics Concern  . Not on file   Social History Narrative   Lives at [facility]:Nursing Home   Caffeine use: Soda rare       Family  History  Problem Relation Age of Onset  . Prostate cancer Brother     Past Medical History  Diagnosis Date  . Hypertension   . Hyperlipidemia   . Aortic stenosis   . High cholesterol     History reviewed. No pertinent past surgical history.  Current Outpatient Prescriptions  Medication Sig Dispense Refill  . acetaminophen (TYLENOL) 325 MG tablet Take 650 mg by mouth every 6 (six) hours as needed (pain).     [name] apixaban (ELIQUIS) 5 MG TABS tablet Take 2.5 mg by mouth 2 (two) times daily.    [name] CINNAMON PO Take 1 capsule by mouth daily. Break open capsule and mix in cereal    . metoprolol succinate (TOPROL-XL) 50 MG 24 hr tablet Take 50 mg by mouth daily. Take with or immediately following a meal.    . Multiple Vitamin (MULTIVITAMIN WITH MINERALS) TABS tablet Take 1 tablet by mouth daily.    . simvastatin (ZOCOR) 10 MG tablet Take 10 mg by mouth at bedtime.     No current facility-administered medications for this visit.    Allergies as of 05/29/2015 - Review Complete 05/10/2015  Allergen Reaction Noted  . Ceftin [cefuroxime] Diarrhea 05/10/2015    Vitals: BP 153/79 mmHg  Pulse 82  Temp(Src) 97.7 F (36.5 C) (Oral)  Ht 5\' 9"  (1.753 m) Last Weight:  Wt Readings from Last 1 Encounters:  04/26/15 191 lb 6.4 oz (86.818 kg)   Last Height:  Ht Readings from Last 1 Encounters:  05/29/15 5\' 9"  (1.753 m)    Physical exam: Exam: Gen: NAD, conversant, well nourised, obese, well groomed                     CV: RRR, no MRG. No Carotid Bruits. No peripheral edema, warm, nontender Eyes: Conjunctivae clear without exudates or hemorrhage  Neuro: Detailed Neurologic Exam  Speech:    Speech is normal; fluent and spontaneous with normal comprehension.  Cognition:    The patient is oriented to person, place, and time;     recent and remote memory intact;     language fluent;     normal attention, concentration,     fund of knowledge Cranial Nerves:    The pupils are equal,  round, and reactive to light. Attempted fundoscopic exam could not visulalize.  Visual fields are full to finger confrontation. Mildly imapired upgaze otherwise extraocular movements are intact. Trigeminal sensation is intact and the muscles of mastication are normal. Left ptosis (chronic) The palate elevates in the midline. Hearing intact to voice. Voice is normal. Shoulder shrug is normal. The tongue has normal motion without fasciculations.   Coordination:    No dysmetria  Gait:    Attempted, can't bear weight  Motor Observation:    No asymmetry, no atrophy, and no involuntary movements noted. Tone:    Normal muscle tone.    Posture:        Strength:    Proximal weakness arms > legs. Arms 3+/5 and legs 4/5. giveway leg leg due to knee pain.      Sensation: intact to LT     Reflex Exam:  DTR's:    Deep tendon reflexes in the lower extremities are absent Toes:    The toes are equivocalbilaterally.   Clonus:    Clonus is absent.      Assessment/Plan:   Seth [name] is a 81 y.o. male here as a referral from Dr. Tenny Craw for dizziness, concussion. Past medical history of hypertension, dyslipidemia, atrial fibrillation (on AC with Eliquis), possibly aortic stenosis, traumatic hemorrhage after a recent fall. He has had 9 falls. 2 of them took him to the emergency room with lacerations.  He can't remember why he falls and he possibly urinated on himself. Also with increasing weakness and decline. Family denies any memory loss.   Falls - EEG to evaluate for epileptiform activity. He is following with cardiology for eval of aortic stenosis.  Proximal weakness - EMG/ncs to evaluate myopathy and labs for myasthenia gravis and lems. Dizziness and vertiginous symptoms - persistent after the second fall with resultant laceration and traumatic brain hemorrage. Likely post-concussive but need a follow up MRIs given his vascular risk factors and his previous multiple lacunar infarctions seen on  CT as well as chronic white-matter changes. Goiter- f/u with pcp or endocrinologist    Naomie Dean, MD  [facility] [address] [place], [place] [address]  Phone [phone] Fax [phone]

## 2015-05-30 ENCOUNTER — Encounter (HOSPITAL_COMMUNITY): Payer: Self-pay | Admitting: Emergency Medicine

## 2015-05-30 ENCOUNTER — Emergency Department (HOSPITAL_COMMUNITY)
Admission: EM | Admit: 2015-05-30 | Discharge: 2015-05-30 | Disposition: A | Discharge status: Home / Self Care | Payer: Medicare Other | Attending: Emergency Medicine | Admitting: Emergency Medicine

## 2015-05-30 ENCOUNTER — Emergency Department (HOSPITAL_COMMUNITY): Payer: Medicare Other

## 2015-05-30 DIAGNOSIS — Z87891 Personal history of nicotine dependence: Secondary | ICD-10-CM | POA: Insufficient documentation

## 2015-05-30 DIAGNOSIS — Z043 Encounter for examination and observation following other accident: Secondary | ICD-10-CM | POA: Insufficient documentation

## 2015-05-30 DIAGNOSIS — R58 Hemorrhage, not elsewhere classified: Secondary | ICD-10-CM | POA: Diagnosis not present

## 2015-05-30 DIAGNOSIS — Z79899 Other long term (current) drug therapy: Secondary | ICD-10-CM | POA: Insufficient documentation

## 2015-05-30 DIAGNOSIS — E780 Pure hypercholesterolemia: Secondary | ICD-10-CM | POA: Diagnosis not present

## 2015-05-30 DIAGNOSIS — T83098A Other mechanical complication of other indwelling urethral catheter, initial encounter: Secondary | ICD-10-CM | POA: Insufficient documentation

## 2015-05-30 DIAGNOSIS — E785 Hyperlipidemia, unspecified: Secondary | ICD-10-CM | POA: Insufficient documentation

## 2015-05-30 DIAGNOSIS — T83511A Infection and inflammatory reaction due to indwelling urethral catheter, initial encounter: Secondary | ICD-10-CM

## 2015-05-30 DIAGNOSIS — R259 Unspecified abnormal involuntary movements: Secondary | ICD-10-CM | POA: Diagnosis not present

## 2015-05-30 DIAGNOSIS — I4891 Unspecified atrial fibrillation: Secondary | ICD-10-CM | POA: Insufficient documentation

## 2015-05-30 DIAGNOSIS — I10 Essential (primary) hypertension: Secondary | ICD-10-CM | POA: Insufficient documentation

## 2015-05-30 DIAGNOSIS — W19XXXA Unspecified fall, initial encounter: Secondary | ICD-10-CM

## 2015-05-30 DIAGNOSIS — Y846 Urinary catheterization as the cause of abnormal reaction of the patient, or of later complication, without mention of misadventure at the time of the procedure: Secondary | ICD-10-CM | POA: Insufficient documentation

## 2015-05-30 DIAGNOSIS — N390 Urinary tract infection, site not specified: Secondary | ICD-10-CM

## 2015-05-30 DIAGNOSIS — T8351XA Infection and inflammatory reaction due to indwelling urinary catheter, initial encounter: Secondary | ICD-10-CM | POA: Diagnosis not present

## 2015-05-30 DIAGNOSIS — R55 Syncope and collapse: Secondary | ICD-10-CM | POA: Diagnosis not present

## 2015-05-30 DIAGNOSIS — Z7902 Long term (current) use of antithrombotics/antiplatelets: Secondary | ICD-10-CM | POA: Diagnosis not present

## 2015-05-30 DIAGNOSIS — S0990XA Unspecified injury of head, initial encounter: Secondary | ICD-10-CM | POA: Diagnosis not present

## 2015-05-30 HISTORY — DX: Unspecified atrial fibrillation: I48.91

## 2015-05-30 LAB — URINE MICROSCOPIC-ADD ON

## 2015-05-30 LAB — CBG MONITORING, ED: GLUCOSE-CAPILLARY: 146 mg/dL — AB (ref 65–99)

## 2015-05-30 LAB — BASIC METABOLIC PANEL
Anion gap: 13 (ref 5–15)
BUN: 22 mg/dL — AB (ref 6–20)
CALCIUM: 9.4 mg/dL (ref 8.9–10.3)
CHLORIDE: 99 mmol/L — AB (ref 101–111)
CO2: 23 mmol/L (ref 22–32)
CREATININE: 1.13 mg/dL (ref 0.61–1.24)
GFR calc Af Amer: >60 mL/min (ref >60)
GFR calc non Af Amer: 56 mL/min — ABNORMAL LOW (ref >60)
Glucose, Bld: 169 mg/dL — ABNORMAL HIGH (ref 65–99)
Potassium: 4 mmol/L (ref 3.5–5.1)
Sodium: 135 mmol/L (ref 135–145)

## 2015-05-30 LAB — CBC
HCT: 40.8 % (ref 39.0–52.0)
Hemoglobin: 13.7 g/dL (ref 13.0–17.0)
MCH: 29.8 pg (ref 26.0–34.0)
MCHC: 33.6 g/dL (ref 30.0–36.0)
MCV: 88.9 fL (ref 78.0–100.0)
PLATELETS: 464 10*3/uL — AB (ref 150–400)
RBC: 4.59 MIL/uL (ref 4.22–5.81)
RDW: 13.6 % (ref 11.5–15.5)
WBC: 13.7 10*3/uL — AB (ref 4.0–10.5)

## 2015-05-30 LAB — URINALYSIS, ROUTINE W REFLEX MICROSCOPIC
BILIRUBIN URINE: NEGATIVE
Glucose, UA: NEGATIVE mg/dL
KETONES UR: NEGATIVE mg/dL
NITRITE: POSITIVE — AB
PROTEIN: 100 mg/dL — AB
Specific Gravity, Urine: 1.021 (ref 1.005–1.030)
Urobilinogen, UA: 1 mg/dL (ref 0.0–1.0)
pH: 6.5 (ref 5.0–8.0)

## 2015-05-30 MED ORDER — DEXTROSE 5 % IV SOLN
1.0000 g | INTRAVENOUS | Status: AC
  Administered 2015-05-30: 1 g via INTRAVENOUS
  Filled 2015-05-30: qty 10

## 2015-05-30 MED ORDER — CIPROFLOXACIN HCL 500 MG PO TABS: 500.0000 mg | ORAL_TABLET | Freq: Two times a day (BID) | ORAL | Status: DC

## 2015-05-30 NOTE — ED Notes (Signed)
Dr. Nanavanti at the bedside.  

## 2015-05-30 NOTE — ED Notes (Signed)
Per [facility], patient was found on the floor at 3am during hourly checks. Found laying on right side on the floor. Patient is from [facility]. No injuries noted. Patient reports feeling dizzy. 104p, 140/850, 95% o2 sat rr 16. afib hx.

## 2015-05-30 NOTE — ED Notes (Signed)
Gail, NP, at the bedside.  

## 2015-05-30 NOTE — ED Notes (Signed)
Family concerned about blood sugar. Reported to Dr. Shyrl Numbers. He acknowledges, plans to see family in regards to plan of care.

## 2015-05-30 NOTE — ED Notes (Signed)
Reported request for MRI to [facility], NP, as family requested. She acknowledges, no new orders.

## 2015-05-30 NOTE — Discharge Instructions (Signed)
You have a urinary tract infection and received one dose of IV antibiotics in the ED and have been given a prescription for oral antibiotis  Please take all the tablets until completed  Make an appointment with your PCP for follow up evaluation in about 10 days -- for a repeat urinalysis --test of cure

## 2015-05-30 NOTE — ED Provider Notes (Signed)
CSN: [id number]     Arrival date & time 05/30/15  0404 History   First MD Initiated Contact with Patient 05/30/15 0410     Chief Complaint  Patient presents with  . Fall     (Consider location/radiation/quality/duration/timing/severity/associated sxs/prior Treatment) HPI Comments: This is an 81 year old male with a history of frequent falls he was found lying next to his bed on the hourly nursing home rounds. Patient states he does not remember getting out of bed or falling he denies any chest pain shortness of breath visual disturbance pain anywhere is moving all extremities his family states that his neurologist is in the process of working him up for these frequent falls.  Patient is a 81 y.o. male presenting with fall. The history is provided by the patient and a relative.  Fall This is a recurrent problem. The current episode started today. The problem occurs intermittently. The problem has been unchanged. Pertinent negatives include no arthralgias, chills, congestion, coughing, fever, headaches, nausea, neck pain, sore throat, vertigo, visual change, vomiting or weakness. Nothing aggravates the symptoms. He has tried nothing for the symptoms. The treatment provided no relief.    Past Medical History  Diagnosis Date  . Hypertension   . Hyperlipidemia   . Aortic stenosis   . High cholesterol   . A-fib    History reviewed. No pertinent past surgical history. Family History  Problem Relation Age of Onset  . Prostate cancer Brother    Social History  Substance Use Topics  . Smoking status: Former Games developer  . Smokeless tobacco: Never Used  . Alcohol Use: No    Review of Systems  Constitutional: Negative for fever and chills.  HENT: Negative for congestion and sore throat.   Eyes: Negative for visual disturbance.  Respiratory: Negative for cough.   Gastrointestinal: Negative for nausea and vomiting.  Genitourinary: Positive for hematuria.       Chronic indwelling Foley  catheter  Musculoskeletal: Negative for arthralgias, neck pain and neck stiffness.  Skin: Negative for wound.  Neurological: Negative for vertigo, weakness and headaches.      Allergies  Ceftin  Home Medications   Prior to Admission medications   Medication Sig Start Date End Date Taking? Authorizing Provider  apixaban (ELIQUIS) 5 MG TABS tablet Take 2.5 mg by mouth 2 (two) times daily.   Yes Historical Provider, MD  CINNAMON PO Take 1 capsule by mouth daily. Break open capsule and mix in cereal   Yes Historical Provider, MD  metoprolol succinate (TOPROL-XL) 50 MG 24 hr tablet Take 50 mg by mouth daily. Take with or immediately following a meal.   Yes Historical Provider, MD  Multiple Vitamin (MULTIVITAMIN WITH MINERALS) TABS tablet Take 1 tablet by mouth daily.   Yes Historical Provider, MD  simvastatin (ZOCOR) 10 MG tablet Take 10 mg by mouth daily.    Yes Historical Provider, MD  acetaminophen (TYLENOL) 325 MG tablet Take 650 mg by mouth every 6 (six) hours as needed (pain).     Historical Provider, MD  ciprofloxacin (CIPRO) 500 MG tablet Take 1 tablet (500 mg total) by mouth 2 (two) times daily. 05/30/15   Earley Favor, NP   BP 129/80 mmHg  Pulse 86  Temp(Src) 99.3 F (37.4 C) (Oral)  Resp 22  Ht 5\' 9"  (1.753 m)  Wt 200 lb (90.719 kg)  BMI 29.52 kg/m2  SpO2 97% Physical Exam  Constitutional: He appears well-developed and well-nourished.  HENT:  Head: Normocephalic.  Right Ear: External ear normal.  Left Ear: External ear normal.  Mouth/Throat: Oropharynx is clear and moist.  Eyes: Pupils are equal, round, and reactive to light.  Neck: Normal range of motion. Neck supple.  Cardiovascular: Normal rate and regular rhythm.   Pulmonary/Chest: Effort normal and breath sounds normal.  Abdominal: Soft. Bowel sounds are normal. There is no tenderness.  Musculoskeletal: Normal range of motion. He exhibits no edema or tenderness.  Range of motion of all joints  Neurological: He  is alert.  Skin: Skin is warm and dry. No rash noted.  Nursing note and vitals reviewed.   ED Course  Procedures (including critical care time) Labs Review Labs Reviewed  BASIC METABOLIC PANEL - Abnormal; Notable for the following:    Chloride 99 (*)    Glucose, Bld 169 (*)    BUN 22 (*)    GFR calc non Af Amer 56 (*)    All other components within normal limits  CBC - Abnormal; Notable for the following:    WBC 13.7 (*)    Platelets 464 (*)    All other components within normal limits  URINALYSIS, ROUTINE W REFLEX MICROSCOPIC (NOT AT [facility]) - Abnormal; Notable for the following:    Color, Urine AMBER (*)    APPearance CLOUDY (*)    Hgb urine dipstick LARGE (*)    Protein, ur 100 (*)    Nitrite POSITIVE (*)    Leukocytes, UA MODERATE (*)    All other components within normal limits  URINE MICROSCOPIC-ADD ON - Abnormal; Notable for the following:    Bacteria, UA MANY (*)    All other components within normal limits  CBG MONITORING, ED - Abnormal; Notable for the following:    Glucose-Capillary 146 (*)    All other components within normal limits  URINE CULTURE    Imaging Review Ct Head Wo Contrast  05/30/2015   CLINICAL DATA:  Found on floor. Concern for head injury. Initial encounter.  EXAM: CT HEAD WITHOUT CONTRAST  TECHNIQUE: Contiguous axial images were obtained from the base of the skull through the vertex without intravenous contrast.  COMPARISON:  CT of the head performed 05/10/2015  FINDINGS: There is no evidence of acute infarction, mass lesion, or intra- or extra-axial hemorrhage on CT.  Prominence of the ventricles and sulci reflects moderate cortical volume loss. Cerebellar atrophy is noted. Chronic lacunar infarcts are seen at the right cerebellar hemisphere. Scattered periventricular and subcortical white matter change likely reflects small vessel ischemic microangiopathy.  The brainstem and fourth ventricle are within normal limits. The basal ganglia are  unremarkable in appearance. The cerebral hemispheres demonstrate grossly normal gray-white differentiation. No mass effect or midline shift is seen.  There is no evidence of fracture; visualized osseous structures are unremarkable in appearance. The orbits are within normal limits. The paranasal sinuses and mastoid air cells are well-aerated. Soft tissue injury is noted at the occiput, with associated laceration.  IMPRESSION: 1. No evidence of traumatic intracranial injury or fracture. 2. Soft tissue injury at the occiput, with associated laceration. 3. Moderate cortical volume loss and scattered small vessel ischemic microangiopathy. 4. Chronic lacunar infarct at the right cerebellar hemisphere.   Electronically Signed   By: Roanna Raider M.D.   On: 05/30/2015 06:13     EKG Interpretation   Date/Time:  Thursday May 30 2015 04:09:27 EDT Ventricular Rate:  98 PR Interval:    QRS Duration: 99 QT Interval:  349 QTC Calculation: 446 R Axis:   27 Text Interpretation:  Atrial fibrillation  Inferior infarct, old Anterior  infarct, old ST elevation in the inferior leads with no reciprocal changes  No significant change since last tracing Reconfirmed by Rhunette Croft, MD,  Janey Genta [phone]) on 05/30/2015 4:21:00 AM      MDM   Final diagnoses:  Fall, initial encounter  UTI (urinary tract infection) due to urinary indwelling Foley catheter         Earley Favor, NP 05/30/15 6045  Derwood Kaplan, MD 05/30/15 2223

## 2015-05-30 NOTE — ED Notes (Signed)
Report called to Marylene Land at [facility].  [facility] notified on need for transport back to facility.

## 2015-05-30 NOTE — ED Notes (Signed)
Dr. Shyrl Numbers aware of wbc 13.7. Urine culture added.

## 2015-05-30 NOTE — ED Notes (Signed)
Patient and family informed he is going back to [facility] and is awaiting transport by [facility].

## 2015-05-31 DIAGNOSIS — R627 Adult failure to thrive: Secondary | ICD-10-CM | POA: Diagnosis not present

## 2015-05-31 LAB — URINE CULTURE

## 2015-06-02 ENCOUNTER — Encounter (HOSPITAL_COMMUNITY): Payer: Self-pay | Admitting: *Deleted

## 2015-06-02 ENCOUNTER — Emergency Department (HOSPITAL_COMMUNITY)
Admission: EM | Admit: 2015-06-02 | Discharge: 2015-06-02 | Disposition: A | Discharge status: Home / Self Care | Payer: Medicare Other | Attending: Emergency Medicine | Admitting: Emergency Medicine

## 2015-06-02 DIAGNOSIS — E780 Pure hypercholesterolemia: Secondary | ICD-10-CM | POA: Diagnosis not present

## 2015-06-02 DIAGNOSIS — Z792 Long term (current) use of antibiotics: Secondary | ICD-10-CM | POA: Insufficient documentation

## 2015-06-02 DIAGNOSIS — T83098A Other mechanical complication of other indwelling urethral catheter, initial encounter: Secondary | ICD-10-CM | POA: Diagnosis not present

## 2015-06-02 DIAGNOSIS — T8131XA Disruption of external operation (surgical) wound, not elsewhere classified, initial encounter: Secondary | ICD-10-CM | POA: Insufficient documentation

## 2015-06-02 DIAGNOSIS — T83028A Displacement of other indwelling urethral catheter, initial encounter: Secondary | ICD-10-CM | POA: Insufficient documentation

## 2015-06-02 DIAGNOSIS — Z87891 Personal history of nicotine dependence: Secondary | ICD-10-CM | POA: Diagnosis not present

## 2015-06-02 DIAGNOSIS — T83198A Other mechanical complication of other urinary devices and implants, initial encounter: Secondary | ICD-10-CM | POA: Diagnosis not present

## 2015-06-02 DIAGNOSIS — Z79899 Other long term (current) drug therapy: Secondary | ICD-10-CM | POA: Insufficient documentation

## 2015-06-02 DIAGNOSIS — I4891 Unspecified atrial fibrillation: Secondary | ICD-10-CM | POA: Insufficient documentation

## 2015-06-02 DIAGNOSIS — T82519A Breakdown (mechanical) of unspecified cardiac and vascular devices and implants, initial encounter: Secondary | ICD-10-CM | POA: Diagnosis not present

## 2015-06-02 DIAGNOSIS — T8351XA Infection and inflammatory reaction due to indwelling urinary catheter, initial encounter: Secondary | ICD-10-CM | POA: Diagnosis not present

## 2015-06-02 DIAGNOSIS — I10 Essential (primary) hypertension: Secondary | ICD-10-CM | POA: Insufficient documentation

## 2015-06-02 DIAGNOSIS — T839XXA Unspecified complication of genitourinary prosthetic device, implant and graft, initial encounter: Secondary | ICD-10-CM

## 2015-06-02 DIAGNOSIS — N4889 Other specified disorders of penis: Secondary | ICD-10-CM | POA: Diagnosis present

## 2015-06-02 DIAGNOSIS — E785 Hyperlipidemia, unspecified: Secondary | ICD-10-CM | POA: Insufficient documentation

## 2015-06-02 DIAGNOSIS — Y846 Urinary catheterization as the cause of abnormal reaction of the patient, or of later complication, without mention of misadventure at the time of the procedure: Secondary | ICD-10-CM | POA: Insufficient documentation

## 2015-06-02 NOTE — ED Notes (Signed)
Dr Donnald Garre in w/pt as per Sharin Mons and family request - pt noted w/open area to posterior head - draining small amount of yellow drainage. Dr Donnald Garre ordered for wet-to-dry dressing to be applied and changed daily - pt to follow up w/nursing home MD. Dressing applied.

## 2015-06-02 NOTE — ED Notes (Signed)
Per EMS: pt coming from home [facility] with c/o foley pain since yesterday afternoon. Pt feels catheter is dislodged, per staff it is not dislodged. Pt reports 1/10 pain at this time. No bleeding noted. Catheter was placed three weeks ago per pt, staff states catheter was placed a week ago. Pt A&Ox4, respirations equal and unlabored, skin warm and dry

## 2015-06-02 NOTE — ED Notes (Signed)
[facility] CALLED @ 1115.

## 2015-06-02 NOTE — ED Notes (Signed)
[facility] called for transport.  

## 2015-06-02 NOTE — ED Provider Notes (Addendum)
CSN: [id number]     Arrival date & time 06/02/15  1007 History   First MD Initiated Contact with Patient 06/02/15 1008     Chief Complaint  Patient presents with  . Penis Pain     (Consider location/radiation/quality/duration/timing/severity/associated sxs/prior Treatment) HPI Patient reports that he awakened yesterday and had a significant pain in his penis. He thought that the catheter may have become dislodged. Since that time the pain has resolved. There has not been associated bleeding. He denies suprapubic pressure or discomfort. He is sent from the nursing home for evaluation of the Foley catheter. Past Medical History  Diagnosis Date  . Hypertension   . Hyperlipidemia   . Aortic stenosis   . High cholesterol   . A-fib    History reviewed. No pertinent past surgical history. Family History  Problem Relation Age of Onset  . Prostate cancer Brother    Social History  Substance Use Topics  . Smoking status: Former Games developer  . Smokeless tobacco: Never Used  . Alcohol Use: No    Review of Systems Constitutional: No fevers no chills GI: No nausea or vomiting.   Allergies  Ceftin  Home Medications   Prior to Admission medications   Medication Sig Start Date End Date Taking? Authorizing Provider  acetaminophen (TYLENOL) 325 MG tablet Take 650 mg by mouth every 6 (six) hours as needed (pain).     Historical Provider, MD  apixaban (ELIQUIS) 5 MG TABS tablet Take 2.5 mg by mouth 2 (two) times daily.    Historical Provider, MD  CINNAMON PO Take 1 capsule by mouth daily. Break open capsule and mix in cereal    Historical Provider, MD  ciprofloxacin (CIPRO) 500 MG tablet Take 1 tablet (500 mg total) by mouth 2 (two) times daily. 05/30/15   Earley Favor, NP  metoprolol succinate (TOPROL-XL) 50 MG 24 hr tablet Take 50 mg by mouth daily. Take with or immediately following a meal.    Historical Provider, MD  Multiple Vitamin (MULTIVITAMIN WITH MINERALS) TABS tablet Take 1 tablet by  mouth daily.    Historical Provider, MD  simvastatin (ZOCOR) 10 MG tablet Take 10 mg by mouth daily.     Historical Provider, MD   BP 130/60 mmHg  Pulse 65  Temp(Src) 97.3 F (36.3 C) (Oral)  Resp 16  SpO2 97% Physical Exam  Constitutional: He is oriented to person, place, and time. He appears well-developed and well-nourished. No distress.  Eyes: EOM are normal.  Pulmonary/Chest: Effort normal.  Abdominal: Soft. He exhibits no distension and no mass. There is no tenderness. There is no rebound and no guarding.  Genitourinary: Penis normal.  Foley catheter is seated in the penis. The meatus is clean and dry without any blood or drainage around it. There is no edema of the penis or the scrotum. The catheter bag has amber colored urine with a small amount of sediment present.  Neurological: He is alert and oriented to person, place, and time. He exhibits normal muscle tone. Coordination normal.  Skin: Skin is warm and dry.  Psychiatric: He has a normal mood and affect.    ED Course  Procedures (including critical care time) Ultrasound of bladder: Ultrasound used to visualize the bladder which shows the Foley catheter balloon sitting within the bladder. Bladder is predominantly decompressed with a small volume of fluid present. Labs Review Labs Reviewed - No data to display  Imaging Review No results found. Doroteo Bradford, personally reviewed and evaluated these images and  lab results as part of my medical decision-making.   EKG Interpretation None      MDM   Final diagnoses:  Foley catheter problem, initial encounter  Dehiscence of closure of skin, initial encounter   Patient had Foley catheter associated pain yesterday. He is sent for confirmation of Foley catheter placement and function today. No indication of dysfunction. It is draining urine and the patient does not have any bladder distention. He does not have pain or general illness or fever to suggest infection.  There has not been bleeding around the meatus. Ultrasound done by myself does show the balloon to be inflated and sitting within the bladder. There is a small amount of sediment in the urinary bag when compressing on the bladder to express any contents. The patient certainly may have had catheter pulled yesterday at the time of his pain and had some minor amount of bleeding. There is however no indication of significant clot or obstruction or significant ureteral tear.    Arby Barrette, MD 06/02/15 1057  At time of transport I was called back to the room to examine a scalp wound. This had begun to drain a small amount. The patient's son and patient report that he had a fall several weeks ago and a scalp wound. He reports that his doctor had removed staples several days ago. The patient denied he was having any problems or pain in the area. On examination of the wound has dehisced and is approximately 2-1/2 cm in length. There is surrounding granulation tissue at the wound margins. Subcutaneous fat is visible with variable amount of granulation tissue. With pressure on the wound margins and there is no drainage or discharge. The wound does not appear infected and the review of systems did not indicate constitutional symptoms or fever. At this time a wet to dry dressing will be placed and recommendations are for continued wet-to-dry dressing changes until the patient can be seen in follow-up at wound care clinic or plastic surgery for a wound revision.  Arby Barrette, MD 06/02/15 1213

## 2015-06-02 NOTE — ED Notes (Signed)
MD at bedside. 

## 2015-06-02 NOTE — Discharge Instructions (Signed)
Foley Catheter Care A Foley catheter is a soft, flexible tube that is placed into the bladder to drain urine. A Foley catheter may be inserted if:  You leak urine or are not able to control when you urinate (urinary incontinence).  You are not able to urinate when you need to (urinary retention).  You had prostate surgery or surgery on the genitals.  You have certain medical conditions, such as multiple sclerosis, dementia, or a spinal cord injury. If you are going home with a Foley catheter in place, follow the instructions below. TAKING CARE OF THE CATHETER  Wash your hands with soap and water.  Using mild soap and warm water on a clean washcloth:  Clean the area on your body closest to the catheter insertion site using a circular motion, moving away from the catheter. Never wipe toward the catheter because this could sweep bacteria up into the urethra and cause infection.  Remove all traces of soap. Pat the area dry with a clean towel. For males, reposition the foreskin.  Attach the catheter to your leg so there is no tension on the catheter. Use adhesive tape or a leg strap. If you are using adhesive tape, remove any sticky residue left behind by the previous tape you used.  Keep the drainage bag below the level of the bladder, but keep it off the floor.  Check throughout the day to be sure the catheter is working and urine is draining freely. Make sure the tubing does not become kinked.  Do not pull on the catheter or try to remove it. Pulling could damage internal tissues. TAKING CARE OF THE DRAINAGE BAGS You will be given two drainage bags to take home. One is a large overnight drainage bag, and the other is a smaller leg bag that fits underneath clothing. You may wear the overnight bag at any time, but you should never wear the smaller leg bag at night. Follow the instructions below for how to empty, change, and clean your drainage bags. Emptying the Drainage Bag You must empty  your drainage bag when it is  - full or at least 2-3 times a day.  Wash your hands with soap and water.  Keep the drainage bag below your hips, below the level of your bladder. This stops urine from going back into the tubing and into your bladder.  Hold the dirty bag over the toilet or a clean container.  Open the pour spout at the bottom of the bag and empty the urine into the toilet or container. Do not let the pour spout touch the toilet, container, or any other surface. Doing so can place bacteria on the bag, which can cause an infection.  Clean the pour spout with a gauze pad or cotton ball that has rubbing alcohol on it.  Close the pour spout.  Attach the bag to your leg with adhesive tape or a leg strap.  Wash your hands well. Changing the Drainage Bag Change your drainage bag once a month or sooner if it starts to smell bad or look dirty. Below are steps to follow when changing the drainage bag.  Wash your hands with soap and water.  Pinch off the rubber catheter so that urine does not spill out.  Disconnect the catheter tube from the drainage tube at the connection valve. Do not let the tubes touch any surface.  Clean the end of the catheter tube with an alcohol wipe. Use a different alcohol wipe to clean the  end of the drainage tube.  Connect the catheter tube to the drainage tube of the clean drainage bag.  Attach the new bag to the leg with adhesive tape or a leg strap. Avoid attaching the new bag too tightly.  Wash your hands well. Cleaning the Drainage Bag  Wash your hands with soap and water.  Wash the bag in warm, soapy water.  Rinse the bag thoroughly with warm water.  Fill the bag with a solution of white vinegar and water (1 cup vinegar to 1 qt warm water [.2 L vinegar to 1 L warm water]). Close the bag and soak it for 30 minutes in the solution.  Rinse the bag with warm water.  Hang the bag to dry with the pour spout open and hanging  downward.  Store the clean bag (once it is dry) in a clean plastic bag.  Wash your hands well. PREVENTING INFECTION  Wash your hands before and after handling your catheter.  Take showers daily and wash the area where the catheter enters your body. Do not take baths. Replace wet leg straps with dry ones, if this applies.  Do not use powders, sprays, or lotions on the genital area. Only use creams, lotions, or ointments as directed by your caregiver.  For females, wipe from front to back after each bowel movement.  Drink enough fluids to keep your urine clear or pale yellow unless you have a fluid restriction.  Do not let the drainage bag or tubing touch or lie on the floor.  Wear cotton underwear to absorb moisture and to keep your skin drier. SEEK MEDICAL CARE IF:   Your urine is cloudy or smells unusually bad.  Your catheter becomes clogged.  You are not draining urine into the bag or your bladder feels full.  Your catheter starts to leak. SEEK IMMEDIATE MEDICAL CARE IF:   You have pain, swelling, redness, or pus where the catheter enters the body.  You have pain in the abdomen, legs, lower back, or bladder.  You have a fever.  You see blood fill the catheter, or your urine is pink or red.  You have nausea, vomiting, or chills.  Your catheter gets pulled out. MAKE SURE YOU:   Understand these instructions.  Will watch your condition.  Will get help right away if you are not doing well or get worse. Document Released: 10/05/2005 Document Revised: 02/19/2014 Document Reviewed: 09/26/2012 [facility] Patient Information 2015 [place], [place]. This information is not intended to replace advice given to you by your health care provider. Make sure you discuss any questions you have with your health care provider. Wound Dehiscence Apply wet to dry dressings daily. Schedule a wound check with plastic surgery or wound care clinic. Wound dehiscence is when a surgical cut  (incision) breaks open and does not heal properly after surgery. It usually happens 7-10 days after surgery. This can be a serious condition. It is important to identify and treat this condition early.  CAUSES  Some common causes of wound dehiscence include:  Stretching of the wound area. This may be caused by lifting, vomiting, violent coughing, or straining during bowel movements.  Wound infection.  Early stitch (suture) removal. RISK FACTORS Various things can increase your risk of developing wound dehiscence, including:  Obesity.  Lung disease.  Smoking.  Poor nutrition.  Contamination during surgery. SIGNS AND SYMPTOMS  Bleeding from the wound.  Pain.  Fever.  Wound starts breaking open. DIAGNOSIS  Your health care provider may  diagnose wound dehiscence by monitoring the incision and noting any changes in the wound. These changes can include an increase in drainage or pain. The health care provider may also ask you if you have noticed any stretching or tearing of the wound.  Wound cultures may be taken to determine if there is an infection.  Imaging studies, such as an MRI scan or CT scan, may be done to determine if there is a collection of pus or fluid in the wound area. TREATMENT Treatment may include:  Wound care.  Surgical repair.  Antibiotic medicine to treat or prevent infection.  Medicines to reduce pain and swelling. HOME CARE INSTRUCTIONS   Only take over-the-counter or prescription medicines for pain, discomfort, or fever as directed by your health care provider. Taking pain medicine 30 minutes before changing a bandage (dressing) can help relieve pain.  Take your antibiotics as directed. Finish them even if you start to feel better.  Gently wash the area with mild soap and water 2 times a day, or as directed. Rinse off the soap. Pat the area dry with a clean towel. Do not rub the wound. This may cause bleeding.  Follow your health care  provider's instructions for how often you need to change the dressing and packing inside. Wash your hands well before and after changing your dressing. Apply a dressing to the wound as directed.  Take showers. Do not soak the wound, bathe, swim, or use a hot tub until directed by your health care provider.  Avoid exercises that make you sweat heavily.  Use anti-itch medicine as directed by your health care provider. The wound may itch when it is healing. Do not pick or scratch at the wound.  Do not lift more than 10 pounds (4.5 kg) until the wound is healed, or as directed by your health care provider.  Keep all follow-up appointments as directed. SEEK MEDICAL CARE IF:  You have excessive bleeding from your surgical wound.  Your wound does not seem to be healing properly.  You have a fever. SEEK IMMEDIATE MEDICAL CARE IF:   You have increased swelling or redness around the wound.  You have increasing pain in the wound.  You have an increasing amount of pus coming from the wound.  Your wound breaks open farther. MAKE SURE YOU:   Understand these instructions.  Will watch your condition.  Will get help right away if you are not doing well or get worse. Document Released: 12/26/2003 Document Revised: 10/10/2013 Document Reviewed: 06/12/2013 [facility] Patient Information 2015 [place], [place]. This information is not intended to replace advice given to you by your health care provider. Make sure you discuss any questions you have with your health care provider.

## 2015-06-04 DIAGNOSIS — I10 Essential (primary) hypertension: Secondary | ICD-10-CM | POA: Diagnosis not present

## 2015-06-04 DIAGNOSIS — Z8673 Personal history of transient ischemic attack (TIA), and cerebral infarction without residual deficits: Secondary | ICD-10-CM | POA: Diagnosis not present

## 2015-06-04 DIAGNOSIS — I480 Paroxysmal atrial fibrillation: Secondary | ICD-10-CM | POA: Diagnosis not present

## 2015-06-04 DIAGNOSIS — R42 Dizziness and giddiness: Secondary | ICD-10-CM | POA: Diagnosis not present

## 2015-06-04 DIAGNOSIS — S0101XD Laceration without foreign body of scalp, subsequent encounter: Secondary | ICD-10-CM | POA: Diagnosis not present

## 2015-06-04 DIAGNOSIS — Z7901 Long term (current) use of anticoagulants: Secondary | ICD-10-CM | POA: Diagnosis not present

## 2015-06-05 DIAGNOSIS — E785 Hyperlipidemia, unspecified: Secondary | ICD-10-CM | POA: Diagnosis not present

## 2015-06-05 DIAGNOSIS — R54 Age-related physical debility: Secondary | ICD-10-CM | POA: Diagnosis not present

## 2015-06-05 DIAGNOSIS — R531 Weakness: Secondary | ICD-10-CM | POA: Diagnosis not present

## 2015-06-05 DIAGNOSIS — I4891 Unspecified atrial fibrillation: Secondary | ICD-10-CM | POA: Diagnosis not present

## 2015-06-05 DIAGNOSIS — S0181XA Laceration without foreign body of other part of head, initial encounter: Secondary | ICD-10-CM | POA: Diagnosis not present

## 2015-06-05 DIAGNOSIS — M25561 Pain in right knee: Secondary | ICD-10-CM | POA: Diagnosis not present

## 2015-06-05 DIAGNOSIS — N390 Urinary tract infection, site not specified: Secondary | ICD-10-CM | POA: Diagnosis not present

## 2015-06-05 DIAGNOSIS — Z79899 Other long term (current) drug therapy: Secondary | ICD-10-CM | POA: Diagnosis not present

## 2015-06-05 DIAGNOSIS — I10 Essential (primary) hypertension: Secondary | ICD-10-CM | POA: Diagnosis not present

## 2015-06-06 DIAGNOSIS — S0101XD Laceration without foreign body of scalp, subsequent encounter: Secondary | ICD-10-CM | POA: Diagnosis not present

## 2015-06-06 DIAGNOSIS — Z7901 Long term (current) use of anticoagulants: Secondary | ICD-10-CM | POA: Diagnosis not present

## 2015-06-06 DIAGNOSIS — I480 Paroxysmal atrial fibrillation: Secondary | ICD-10-CM | POA: Diagnosis not present

## 2015-06-06 DIAGNOSIS — Z8673 Personal history of transient ischemic attack (TIA), and cerebral infarction without residual deficits: Secondary | ICD-10-CM | POA: Diagnosis not present

## 2015-06-06 DIAGNOSIS — I10 Essential (primary) hypertension: Secondary | ICD-10-CM | POA: Diagnosis not present

## 2015-06-06 DIAGNOSIS — R42 Dizziness and giddiness: Secondary | ICD-10-CM | POA: Diagnosis not present

## 2015-06-07 ENCOUNTER — Telehealth: Payer: Self-pay | Admitting: Neurology

## 2015-06-07 NOTE — Telephone Encounter (Signed)
Spoke to daughter and discussed in detail the labs that were back, still awaiting final completion of labs. Discussed that Achr blocking antibody came back positive. Given his proximal weakness and other findings, this is suggestive of myasthenia gravis. He is coming back to the office on the 30th for emg/ncs. He is stable. I discussed multiple times that MG can quickly progress and patient needs to be brought to the emergency room if his condition worsens or he develops SOB. Patient is coming back on the 30th and all the 4 siblings and spouses are coming for a family meeting. We can discuss treatment, benefits and risks at that time. Daughter prefers to wait before treating. EMG/NCS can also add information as a positive antibody can be a false positive but the information has to be added to clinical history. Discussed mestinon and some possible side effects such as GI upset and diarrhea.

## 2015-06-08 DIAGNOSIS — Z7901 Long term (current) use of anticoagulants: Secondary | ICD-10-CM | POA: Diagnosis not present

## 2015-06-08 DIAGNOSIS — R42 Dizziness and giddiness: Secondary | ICD-10-CM | POA: Diagnosis not present

## 2015-06-08 DIAGNOSIS — I480 Paroxysmal atrial fibrillation: Secondary | ICD-10-CM | POA: Diagnosis not present

## 2015-06-08 DIAGNOSIS — I10 Essential (primary) hypertension: Secondary | ICD-10-CM | POA: Diagnosis not present

## 2015-06-08 DIAGNOSIS — S0101XD Laceration without foreign body of scalp, subsequent encounter: Secondary | ICD-10-CM | POA: Diagnosis not present

## 2015-06-08 DIAGNOSIS — Z8673 Personal history of transient ischemic attack (TIA), and cerebral infarction without residual deficits: Secondary | ICD-10-CM | POA: Diagnosis not present

## 2015-06-08 LAB — MYOSITIS PANEL III
EJ*: NEGATIVE
JO-1 (WB): NEGATIVE
KU: NEGATIVE
Mi-2 antibodies*: NEGATIVE
OJ*: NEGATIVE
PL-12*: NEGATIVE
PL-7*: NEGATIVE
PM-SCL 100: NEGATIVE
PM-Scl 75*: NEGATIVE
RNP: 12.5 EU/ml
RO-52*: NEGATIVE
Signal Recognition Particle*: POSITIVE — AB

## 2015-06-08 LAB — B12 AND FOLATE PANEL
Folate: >20 ng/mL (ref >3.0)
Vitamin B-12: 438 pg/mL (ref 211–946)

## 2015-06-08 LAB — COMPREHENSIVE METABOLIC PANEL
ALBUMIN: 4 g/dL (ref 3.5–4.7)
ALK PHOS: 138 IU/L — AB (ref 39–117)
ALT: 18 IU/L (ref 0–44)
AST: 21 IU/L (ref 0–40)
Albumin/Globulin Ratio: 1.1 (ref 1.1–2.5)
BUN / CREAT RATIO: 20 (ref 10–22)
BUN: 22 mg/dL (ref 8–27)
Bilirubin Total: 1.2 mg/dL (ref 0.0–1.2)
CHLORIDE: 95 mmol/L — AB (ref 97–108)
CO2: 25 mmol/L (ref 18–29)
CREATININE: 1.11 mg/dL (ref 0.76–1.27)
Calcium: 10.2 mg/dL (ref 8.6–10.2)
GFR calc Af Amer: 68 mL/min/{1.73_m2} (ref >59)
GFR calc non Af Amer: 59 mL/min/{1.73_m2} — ABNORMAL LOW (ref >59)
GLOBULIN, TOTAL: 3.7 g/dL (ref 1.5–4.5)
GLUCOSE: 152 mg/dL — AB (ref 65–99)
Potassium: 5.3 mmol/L — ABNORMAL HIGH (ref 3.5–5.2)
Sodium: 140 mmol/L (ref 134–144)
Total Protein: 7.7 g/dL (ref 6.0–8.5)

## 2015-06-08 LAB — VGCC ANTIBODY: VGCC ANTIBODY: NEGATIVE

## 2015-06-08 LAB — ACETYLCHOLINE RECEPTOR, BLOCKING: Acetylchol Block Ab: 35 % — ABNORMAL HIGH (ref 0–25)

## 2015-06-08 LAB — TSH: TSH: 0.872 u[IU]/mL (ref 0.450–4.500)

## 2015-06-08 LAB — CK: Total CK: 25 U/L (ref 24–204)

## 2015-06-08 LAB — ACETYLCHOLINE RECEPTOR, MODULATING: Acetylcholine Modulat Ab: <12 % (ref 0–20)

## 2015-06-08 LAB — ACETYLCHOLINE RECEPTOR, BINDING: AChR Binding Ab, Serum: 0.05 nmol/L (ref 0.00–0.24)

## 2015-06-10 DIAGNOSIS — R42 Dizziness and giddiness: Secondary | ICD-10-CM | POA: Diagnosis not present

## 2015-06-10 DIAGNOSIS — Z7901 Long term (current) use of anticoagulants: Secondary | ICD-10-CM | POA: Diagnosis not present

## 2015-06-10 DIAGNOSIS — S0101XD Laceration without foreign body of scalp, subsequent encounter: Secondary | ICD-10-CM | POA: Diagnosis not present

## 2015-06-10 DIAGNOSIS — I480 Paroxysmal atrial fibrillation: Secondary | ICD-10-CM | POA: Diagnosis not present

## 2015-06-10 DIAGNOSIS — I10 Essential (primary) hypertension: Secondary | ICD-10-CM | POA: Diagnosis not present

## 2015-06-10 DIAGNOSIS — Z8673 Personal history of transient ischemic attack (TIA), and cerebral infarction without residual deficits: Secondary | ICD-10-CM | POA: Diagnosis not present

## 2015-06-11 ENCOUNTER — Other Ambulatory Visit: Payer: Self-pay | Admitting: Urology

## 2015-06-11 DIAGNOSIS — M25561 Pain in right knee: Secondary | ICD-10-CM | POA: Diagnosis not present

## 2015-06-11 DIAGNOSIS — I4891 Unspecified atrial fibrillation: Secondary | ICD-10-CM | POA: Diagnosis not present

## 2015-06-11 DIAGNOSIS — I10 Essential (primary) hypertension: Secondary | ICD-10-CM | POA: Diagnosis not present

## 2015-06-11 DIAGNOSIS — N39498 Other specified urinary incontinence: Secondary | ICD-10-CM | POA: Diagnosis not present

## 2015-06-12 ENCOUNTER — Ambulatory Visit
Admission: RE | Admit: 2015-06-12 | Discharge: 2015-06-12 | Disposition: A | Discharge status: Home / Self Care | Payer: Medicare Other | Source: Ambulatory Visit | Attending: Neurology | Admitting: Neurology

## 2015-06-12 DIAGNOSIS — S0101XD Laceration without foreign body of scalp, subsequent encounter: Secondary | ICD-10-CM | POA: Diagnosis not present

## 2015-06-12 DIAGNOSIS — R270 Ataxia, unspecified: Secondary | ICD-10-CM | POA: Diagnosis not present

## 2015-06-12 DIAGNOSIS — R404 Transient alteration of awareness: Secondary | ICD-10-CM | POA: Diagnosis not present

## 2015-06-12 DIAGNOSIS — M6289 Other specified disorders of muscle: Secondary | ICD-10-CM

## 2015-06-12 DIAGNOSIS — M6281 Muscle weakness (generalized): Secondary | ICD-10-CM

## 2015-06-12 DIAGNOSIS — R4020 Unspecified coma: Secondary | ICD-10-CM

## 2015-06-12 DIAGNOSIS — R531 Weakness: Secondary | ICD-10-CM

## 2015-06-12 DIAGNOSIS — R55 Syncope and collapse: Secondary | ICD-10-CM

## 2015-06-12 DIAGNOSIS — R401 Stupor: Secondary | ICD-10-CM | POA: Diagnosis not present

## 2015-06-12 DIAGNOSIS — I619 Nontraumatic intracerebral hemorrhage, unspecified: Secondary | ICD-10-CM

## 2015-06-12 DIAGNOSIS — I10 Essential (primary) hypertension: Secondary | ICD-10-CM | POA: Diagnosis not present

## 2015-06-12 DIAGNOSIS — G459 Transient cerebral ischemic attack, unspecified: Secondary | ICD-10-CM

## 2015-06-12 DIAGNOSIS — E538 Deficiency of other specified B group vitamins: Secondary | ICD-10-CM

## 2015-06-12 DIAGNOSIS — H8149 Vertigo of central origin, unspecified ear: Secondary | ICD-10-CM | POA: Diagnosis not present

## 2015-06-12 DIAGNOSIS — R42 Dizziness and giddiness: Secondary | ICD-10-CM

## 2015-06-12 DIAGNOSIS — R5383 Other fatigue: Secondary | ICD-10-CM

## 2015-06-12 DIAGNOSIS — W19XXXA Unspecified fall, initial encounter: Secondary | ICD-10-CM

## 2015-06-12 DIAGNOSIS — Z7901 Long term (current) use of anticoagulants: Secondary | ICD-10-CM | POA: Diagnosis not present

## 2015-06-12 DIAGNOSIS — I480 Paroxysmal atrial fibrillation: Secondary | ICD-10-CM | POA: Diagnosis not present

## 2015-06-12 DIAGNOSIS — H814 Vertigo of central origin: Secondary | ICD-10-CM

## 2015-06-12 DIAGNOSIS — Z8673 Personal history of transient ischemic attack (TIA), and cerebral infarction without residual deficits: Secondary | ICD-10-CM | POA: Diagnosis not present

## 2015-06-12 MED ORDER — GADOBENATE DIMEGLUMINE 529 MG/ML IV SOLN
18.0000 mL | Freq: Once | INTRAVENOUS | Status: AC | PRN
  Administered 2015-06-12: 18 mL via INTRAVENOUS

## 2015-06-13 DIAGNOSIS — R42 Dizziness and giddiness: Secondary | ICD-10-CM | POA: Diagnosis not present

## 2015-06-13 DIAGNOSIS — Z7901 Long term (current) use of anticoagulants: Secondary | ICD-10-CM | POA: Diagnosis not present

## 2015-06-13 DIAGNOSIS — Z8673 Personal history of transient ischemic attack (TIA), and cerebral infarction without residual deficits: Secondary | ICD-10-CM | POA: Diagnosis not present

## 2015-06-13 DIAGNOSIS — S0101XD Laceration without foreign body of scalp, subsequent encounter: Secondary | ICD-10-CM | POA: Diagnosis not present

## 2015-06-13 DIAGNOSIS — I10 Essential (primary) hypertension: Secondary | ICD-10-CM | POA: Diagnosis not present

## 2015-06-13 DIAGNOSIS — I480 Paroxysmal atrial fibrillation: Secondary | ICD-10-CM | POA: Diagnosis not present

## 2015-06-14 ENCOUNTER — Encounter (HOSPITAL_COMMUNITY): Payer: Self-pay | Admitting: *Deleted

## 2015-06-14 ENCOUNTER — Ambulatory Visit: Payer: Medicare Other | Admitting: Podiatry

## 2015-06-14 DIAGNOSIS — Z7901 Long term (current) use of anticoagulants: Secondary | ICD-10-CM | POA: Diagnosis not present

## 2015-06-14 DIAGNOSIS — S0101XD Laceration without foreign body of scalp, subsequent encounter: Secondary | ICD-10-CM | POA: Diagnosis not present

## 2015-06-14 DIAGNOSIS — I480 Paroxysmal atrial fibrillation: Secondary | ICD-10-CM | POA: Diagnosis not present

## 2015-06-14 DIAGNOSIS — R42 Dizziness and giddiness: Secondary | ICD-10-CM | POA: Diagnosis not present

## 2015-06-14 DIAGNOSIS — I10 Essential (primary) hypertension: Secondary | ICD-10-CM | POA: Diagnosis not present

## 2015-06-14 DIAGNOSIS — Z8673 Personal history of transient ischemic attack (TIA), and cerebral infarction without residual deficits: Secondary | ICD-10-CM | POA: Diagnosis not present

## 2015-06-17 DIAGNOSIS — I480 Paroxysmal atrial fibrillation: Secondary | ICD-10-CM | POA: Diagnosis not present

## 2015-06-17 DIAGNOSIS — Z8673 Personal history of transient ischemic attack (TIA), and cerebral infarction without residual deficits: Secondary | ICD-10-CM | POA: Diagnosis not present

## 2015-06-17 DIAGNOSIS — I10 Essential (primary) hypertension: Secondary | ICD-10-CM | POA: Diagnosis not present

## 2015-06-17 DIAGNOSIS — S0101XD Laceration without foreign body of scalp, subsequent encounter: Secondary | ICD-10-CM | POA: Diagnosis not present

## 2015-06-17 DIAGNOSIS — R42 Dizziness and giddiness: Secondary | ICD-10-CM | POA: Diagnosis not present

## 2015-06-17 DIAGNOSIS — Z7901 Long term (current) use of anticoagulants: Secondary | ICD-10-CM | POA: Diagnosis not present

## 2015-06-18 ENCOUNTER — Ambulatory Visit (INDEPENDENT_AMBULATORY_CARE_PROVIDER_SITE_OTHER): Payer: Medicare Other | Admitting: Neurology

## 2015-06-18 DIAGNOSIS — R4020 Unspecified coma: Secondary | ICD-10-CM

## 2015-06-18 DIAGNOSIS — M6289 Other specified disorders of muscle: Secondary | ICD-10-CM

## 2015-06-18 DIAGNOSIS — R55 Syncope and collapse: Secondary | ICD-10-CM

## 2015-06-18 DIAGNOSIS — M6281 Muscle weakness (generalized): Secondary | ICD-10-CM

## 2015-06-18 DIAGNOSIS — R531 Weakness: Secondary | ICD-10-CM

## 2015-06-18 DIAGNOSIS — Z0289 Encounter for other administrative examinations: Secondary | ICD-10-CM

## 2015-06-18 DIAGNOSIS — W19XXXA Unspecified fall, initial encounter: Secondary | ICD-10-CM

## 2015-06-18 DIAGNOSIS — G7000 Myasthenia gravis without (acute) exacerbation: Secondary | ICD-10-CM

## 2015-06-18 NOTE — Procedures (Signed)
    History:  Seth [name] is an 81 year old gentleman with a history of dizziness, and concussion. The patient has had a history of atrial fibrillation. He has had several falls, he will have episodes of loss of consciousness with the fall, waking up on the ground. He does not recall the falling episode. He is being evaluated for these events.  This is a routine EEG. No skull defects are noted. Medications include Tylenol, Eliquis, Dulcolax, Cipro, Remeron, metoprolol, multivitamins, Senokot, and Zocor.   EEG classification: Normal awake  Description of the recording: The background rhythms of this recording consists of a fairly well modulated medium amplitude alpha rhythm of 9 Hz that is reactive to eye opening and closure. As the record progresses, the patient appears to remain in the waking state throughout the recording. Photic stimulation and hyperventilation were not performed. At no time during the recording does there appear to be evidence of spike or spike wave discharges or evidence of focal slowing. EKG monitor shows no evidence of cardiac rhythm abnormalities with a heart rate of 72.  Impression: This is a normal EEG recording in the waking state. No evidence of ictal or interictal discharges are seen.

## 2015-06-20 ENCOUNTER — Encounter: Payer: Self-pay | Admitting: *Deleted

## 2015-06-20 ENCOUNTER — Telehealth: Payer: Self-pay | Admitting: Neurology

## 2015-06-20 DIAGNOSIS — S0101XD Laceration without foreign body of scalp, subsequent encounter: Secondary | ICD-10-CM | POA: Diagnosis not present

## 2015-06-20 DIAGNOSIS — R42 Dizziness and giddiness: Secondary | ICD-10-CM | POA: Diagnosis not present

## 2015-06-20 DIAGNOSIS — Z8673 Personal history of transient ischemic attack (TIA), and cerebral infarction without residual deficits: Secondary | ICD-10-CM | POA: Diagnosis not present

## 2015-06-20 DIAGNOSIS — I480 Paroxysmal atrial fibrillation: Secondary | ICD-10-CM | POA: Diagnosis not present

## 2015-06-20 DIAGNOSIS — Z7901 Long term (current) use of anticoagulants: Secondary | ICD-10-CM | POA: Diagnosis not present

## 2015-06-20 DIAGNOSIS — I10 Essential (primary) hypertension: Secondary | ICD-10-CM | POA: Diagnosis not present

## 2015-06-20 MED ORDER — PYRIDOSTIGMINE BROMIDE 60 MG PO TABS: 30.0000 mg | ORAL_TABLET | Freq: Three times a day (TID) | ORAL | Status: DC

## 2015-06-20 NOTE — Telephone Encounter (Signed)
Right after I sent message to nurse, [facility] notified daughter in law that they just received the order. Please disregard previous message.

## 2015-06-20 NOTE — Telephone Encounter (Signed)
Seth [name]/[name] called regarding order for Rx's that were supposed to be called in or faxed to [facility]. [facility] says they haven't received it.

## 2015-06-20 NOTE — Progress Notes (Signed)
06/14/15- spoke with [facility]( Tisha)( tech) to fax hx, ekg and cxr within year [facility] and POA information.  Received history and meds and part of POA .  Called back and asked for Vladimir Creeks to refax ( she was gone for day).   06/18/15- Faxed to [facility] a request to obtain complete POA papers.   06/18/15- Called and left message for Yates Decamp to send to [facility]- orders for CBC and BMP to be drawn prior to surgery.   06/18/2015 Received POA papers again but not complete.  refaxed request for complete set of POA papers to be faxed to [facility].   06/20/2015- Received 1-8 pages of completed POA.   06/20/15- Called [facility] and they have not yet received any orders for labs to be drawn prior to surgery.   06/20/2015 faxed preop instructions to [facility] and also left message for Yates Decamp at [facility] to fax to [facility] order to draw CBC and BMP prior to surgery on 06/26/15 and made her aware by message that patient on Eliquis and Dr Ronne Binning will need to provide them with instructions for Eliquis if any.

## 2015-06-20 NOTE — Progress Notes (Signed)
Faxed Rx Mestinon 60 mg to [facility], where the pt is staying per Dr. Lucia Gaskins request. Fax number: [phone]. Received fax confirmation.   I called [facility] to verify fax number before faxing Rx.

## 2015-06-21 DIAGNOSIS — R54 Age-related physical debility: Secondary | ICD-10-CM | POA: Diagnosis not present

## 2015-06-21 DIAGNOSIS — I10 Essential (primary) hypertension: Secondary | ICD-10-CM | POA: Diagnosis not present

## 2015-06-21 DIAGNOSIS — E785 Hyperlipidemia, unspecified: Secondary | ICD-10-CM | POA: Diagnosis not present

## 2015-06-21 DIAGNOSIS — I4891 Unspecified atrial fibrillation: Secondary | ICD-10-CM | POA: Diagnosis not present

## 2015-06-21 DIAGNOSIS — Z79899 Other long term (current) drug therapy: Secondary | ICD-10-CM | POA: Diagnosis not present

## 2015-06-21 NOTE — Progress Notes (Signed)
See procedure note.

## 2015-06-23 NOTE — Procedures (Signed)
[facility]    Provider:  Dr Lucia Gaskins Referring Provider: Gildardo Cranker, MD Primary Care Physician:   Duane Lope, MD  CC:  Proximal weakness  HPI:  Seth [name] is a 81 y.o. male here as a referral from Dr. Tenny Craw for proximal weakness. His labs were significant for +Acetylcholine Blocking antibodies. Past medical history of hypertension, dyslipidemia, atrial fibrillation (on AC with Eliquis), possibly aortic stenosis. He has had 9 falls. 2 of them took him to the emergency room. He has been having falls for about a year.  No memory loss, his mind is sharp per his family providing most of the information here today. He has been experiencing dizziness since the second fall but this is improved and likely secondary to concussion.  Denies double vision, ptosis, SOB, dysphagia,dysarthria. Endorses weakness over the last 1 year with a decline over the last 2 months since the falls.   MRi of the brain:  IMPRESSION: This is an abnormal MRI of the brain without contrast showing the following: 1. Moderate cortical atrophy but more severe ventriculomegaly. Although the large ventricles could be due to primarily to the atrophy, normal pressure hydrocephalus cannot be ruled out. 2. Moderate age related chronic microvascular ischemic changes. 3. There are no acute findings.  MRA of the head  IMPRESSION: This is an abnormal MR angiogram of the intracranial arteries showing mild focal stenosis within the P2 segment of the right posterior cerebral artery that is unlikely to be clinically significant. Additionally, there is reduced flow in the superior division of the right MCA compared to the left, though no definite stenosis is identified. No aneurysms were identified.  MRA of the neck:   IMPRESSION: This MR angiogram of the neck arteries shows no stenosis of the right common carotid origin likely to be clinically significant. Other arteries appeared  normal.     Summary  Nerve conduction studies were performed on the right upper and left lower extremities:   The right median APB motor nerve showed prolonged distal onset latency (4,4 ms, N<4.0). The right Median 2nd Digit sensory nerve showed prolonged distal peak latency (4.3 ms, N<3.9). F Wave studies indicate that the right Median F wave has normal latency. .  The right Ulnar ADM motor nerve showed reduced amplitude (1.68mV, N>5) and decreased conduction velocity (Wrist -B Elbow, 44 m/s, N>47) The right Ulnar 5th Digit sensory nerve showed prolonged distal peak latency (5.0 ms, N<3.5) and reduced amplitude (5uV, N>10). F Wave studies indicate that the right Ulnar F wave has delayed latency (34.44ms, N<34)  The left Peroneal motor nerve showed reduced amplitude (0.47mV, N>2) The left Tibial motor nerve showed prolonged distal onset latency (8 ms, N<6.9) and reduced amplitude (0.60mV, N>3)  The left Sural and peroneal sensory nerve conductions showed no response.  Left H Reflex showed no response  F Wave studies indicate that the left Peroneal F wave showed no response F Wave studies indicate that the left Tibial F wave showed delayed latency (67ms, N<58)  Repetitive nerve stimulation of the right abductor pollicis did not show significant decrement. Patient could not tolerate more proximal testing.   EMG Needle study was performed on selected right upper and left lower extremity muscles:   The right Deltoid, right Biceps, right Triceps, right Pronator Teres, right First Dorsal Interrosseous, left Iliopsoas, left Vastus Medialis, left Anterior Tibialis, left Medial Gastrocnemius, left Extensor Hallucis Longus muscles were within normal limits.  This was a technically difficult study due to patient's immobility. Patient  cannot bear weight and could not be lifted up on the examining table. Studies were completed while patient was sitting in wheelchair.  Conclusion: There is  electrophysiologic evidence for a length-dependent, axonal, sensorimotor polyneuropathy. No conduction block to suggest a demyelinating motor neuropathy. No evidence of myopathy or myositis. No acute/ongoing denervation to indicate current radiculopathy. There is no decrement on repetitive nerve stimulation seen to support a diagnosis of Myasthenia Gravis however could only perform this testing on a distal muscle and this is less sensitive than proximal muscles which patient could not tolerate. Discussed findings of EMG nerve conduction study as well as laboratory testing and MRI imaging with family members that are present today including his 3 children and their spouses. We'll start Mestinon at this time and consider low-dose steroids. Discussed side effects of Mestinon including GI side effects.    Naomie Dean, MD  [facility] [address] [place], [place] [address]  Phone [phone] Fax [phone]

## 2015-06-23 NOTE — Progress Notes (Signed)
[facility]    Provider:  Dr Machele Deihl Referring Provider: Ross, Charles, MD Primary Care Physician:   Ross, Alan, MD  CC:  Proximal weakness  HPI:  Seth [name] is a 81 y.o. male here as a referral from Dr. Ross for proximal weakness. His labs were significant for +Acetylcholine Blocking antibodies. Past medical history of hypertension, dyslipidemia, atrial fibrillation (on AC with Eliquis), possibly aortic stenosis. He has had 9 falls. 2 of them took him to the emergency room. He has been having falls for about a year.  No memory loss, his mind is sharp per his family providing most of the information here today. He has been experiencing dizziness since the second fall but this is improved and likely secondary to concussion.  Denies double vision, ptosis, SOB, dysphagia,dysarthria. Endorses weakness over the last 1 year with a decline over the last 2 months since the falls.   MRi of the brain:  IMPRESSION: This is an abnormal MRI of the brain without contrast showing the following: 1. Moderate cortical atrophy but more severe ventriculomegaly. Although the large ventricles could be due to primarily to the atrophy, normal pressure hydrocephalus cannot be ruled out. 2. Moderate age related chronic microvascular ischemic changes. 3. There are no acute findings.  MRA of the head  IMPRESSION: This is an abnormal MR angiogram of the intracranial arteries showing mild focal stenosis within the P2 segment of the right posterior cerebral artery that is unlikely to be clinically significant. Additionally, there is reduced flow in the superior division of the right MCA compared to the left, though no definite stenosis is identified. No aneurysms were identified.  MRA of the neck:   IMPRESSION: This MR angiogram of the neck arteries shows no stenosis of the right common carotid origin likely to be clinically significant. Other arteries appeared  normal.     Summary  Nerve conduction studies were performed on the right upper and left lower extremities:   The right median APB motor nerve showed prolonged distal onset latency (4,4 ms, N<4.0). The right Median 2nd Digit sensory nerve showed prolonged distal peak latency (4.3 ms, N<3.9). F Wave studies indicate that the right Median F wave has normal latency. .  The right Ulnar ADM motor nerve showed reduced amplitude (1.5mV, N>5) and decreased conduction velocity (Wrist -B Elbow, 44 m/s, N>47) The right Ulnar 5th Digit sensory nerve showed prolonged distal peak latency (5.0 ms, N<3.5) and reduced amplitude (5uV, N>10). F Wave studies indicate that the right Ulnar F wave has delayed latency (34.8ms, N<34)  The left Peroneal motor nerve showed reduced amplitude (0.30mV, N>2) The left Tibial motor nerve showed prolonged distal onset latency (8 ms, N<6.9) and reduced amplitude (0.50mV, N>3)  The left Sural and peroneal sensory nerve conductions showed no response.  Left H Reflex showed no response  F Wave studies indicate that the left Peroneal F wave showed no response F Wave studies indicate that the left Tibial F wave showed delayed latency (67ms, N<58)  Repetitive nerve stimulation of the right abductor pollicis did not show significant decrement. Patient could not tolerate more proximal testing.   EMG Needle study was performed on selected right upper and left lower extremity muscles:   The right Deltoid, right Biceps, right Triceps, right Pronator Teres, right First Dorsal Interrosseous, left Iliopsoas, left Vastus Medialis, left Anterior Tibialis, left Medial Gastrocnemius, left Extensor Hallucis Longus muscles were within normal limits.  This was a technically difficult study due to patient's immobility. Patient   cannot bear weight and could not be lifted up on the examining table. Studies were completed while patient was sitting in wheelchair.  Conclusion: There is  electrophysiologic evidence for a length-dependent, axonal, sensorimotor polyneuropathy. No conduction block to suggest a demyelinating motor neuropathy. No evidence of myopathy or myositis. No acute/ongoing denervation to indicate current radiculopathy. There is no decrement on repetitive nerve stimulation seen to support a diagnosis of Myasthenia Gravis however could only perform this testing on a distal muscle and this is less sensitive than proximal muscles which patient could not tolerate. Discussed findings of EMG nerve conduction study as well as laboratory testing and MRI imaging with family members that are present today including his 3 children and their spouses. We'll start Mestinon at this time and consider low-dose steroids. Discussed side effects of Mestinon including GI side effects.    Leanor Voris, MD  [facility] [address] [place], [place] [address]  Phone [phone] Fax [phone] 

## 2015-06-25 ENCOUNTER — Encounter (HOSPITAL_COMMUNITY): Payer: Self-pay | Admitting: *Deleted

## 2015-06-25 DIAGNOSIS — Z8673 Personal history of transient ischemic attack (TIA), and cerebral infarction without residual deficits: Secondary | ICD-10-CM | POA: Diagnosis not present

## 2015-06-25 DIAGNOSIS — M79676 Pain in unspecified toe(s): Secondary | ICD-10-CM | POA: Diagnosis not present

## 2015-06-25 DIAGNOSIS — R262 Difficulty in walking, not elsewhere classified: Secondary | ICD-10-CM | POA: Diagnosis not present

## 2015-06-25 DIAGNOSIS — G609 Hereditary and idiopathic neuropathy, unspecified: Secondary | ICD-10-CM | POA: Diagnosis not present

## 2015-06-25 DIAGNOSIS — M2010 Hallux valgus (acquired), unspecified foot: Secondary | ICD-10-CM | POA: Diagnosis not present

## 2015-06-25 DIAGNOSIS — M24576 Contracture, unspecified foot: Secondary | ICD-10-CM | POA: Diagnosis not present

## 2015-06-25 DIAGNOSIS — I480 Paroxysmal atrial fibrillation: Secondary | ICD-10-CM | POA: Diagnosis not present

## 2015-06-25 DIAGNOSIS — B351 Tinea unguium: Secondary | ICD-10-CM | POA: Diagnosis not present

## 2015-06-25 DIAGNOSIS — Z7901 Long term (current) use of anticoagulants: Secondary | ICD-10-CM | POA: Diagnosis not present

## 2015-06-25 DIAGNOSIS — S0101XD Laceration without foreign body of scalp, subsequent encounter: Secondary | ICD-10-CM | POA: Diagnosis not present

## 2015-06-25 DIAGNOSIS — I10 Essential (primary) hypertension: Secondary | ICD-10-CM | POA: Diagnosis not present

## 2015-06-25 DIAGNOSIS — R42 Dizziness and giddiness: Secondary | ICD-10-CM | POA: Diagnosis not present

## 2015-06-25 DIAGNOSIS — L84 Corns and callosities: Secondary | ICD-10-CM | POA: Diagnosis not present

## 2015-06-25 NOTE — Progress Notes (Addendum)
Lab results were not received for CBC and BMP will draw on arrival Wednesday, 06-26-15. Family will be with Seth [name] tomorrow for his procedure.  Stopped Eliquis 06-24-15.

## 2015-06-25 NOTE — Progress Notes (Signed)
Called [facility] re:lab work CBC and BMP there were no results available.  They are calling for results to have them faxed to the facility; plan to fax a copy to [facility].

## 2015-06-26 ENCOUNTER — Ambulatory Visit (HOSPITAL_COMMUNITY): Payer: Medicare Other | Admitting: Anesthesiology

## 2015-06-26 ENCOUNTER — Encounter (HOSPITAL_COMMUNITY): Payer: Self-pay | Admitting: Anesthesiology

## 2015-06-26 ENCOUNTER — Ambulatory Visit (HOSPITAL_COMMUNITY)
Admission: RE | Admit: 2015-06-26 | Discharge: 2015-06-26 | Disposition: A | Discharge status: Nursing Home (Includes ICF) | Payer: Medicare Other | Source: Ambulatory Visit | Attending: Urology | Admitting: Urology

## 2015-06-26 ENCOUNTER — Encounter (HOSPITAL_COMMUNITY)
Admission: RE | Disposition: A | Discharge status: Nursing Home (Includes ICF) | Payer: Self-pay | Source: Ambulatory Visit | Attending: Urology

## 2015-06-26 ENCOUNTER — Ambulatory Visit: Payer: Medicare Other | Admitting: Cardiovascular Disease

## 2015-06-26 DIAGNOSIS — Z8673 Personal history of transient ischemic attack (TIA), and cerebral infarction without residual deficits: Secondary | ICD-10-CM | POA: Insufficient documentation

## 2015-06-26 DIAGNOSIS — R350 Frequency of micturition: Secondary | ICD-10-CM | POA: Diagnosis not present

## 2015-06-26 DIAGNOSIS — Z79899 Other long term (current) drug therapy: Secondary | ICD-10-CM | POA: Insufficient documentation

## 2015-06-26 DIAGNOSIS — Z6824 Body mass index (BMI) 24.0-24.9, adult: Secondary | ICD-10-CM | POA: Insufficient documentation

## 2015-06-26 DIAGNOSIS — F0390 Unspecified dementia without behavioral disturbance: Secondary | ICD-10-CM | POA: Diagnosis not present

## 2015-06-26 DIAGNOSIS — N401 Enlarged prostate with lower urinary tract symptoms: Secondary | ICD-10-CM | POA: Diagnosis not present

## 2015-06-26 DIAGNOSIS — I10 Essential (primary) hypertension: Secondary | ICD-10-CM | POA: Insufficient documentation

## 2015-06-26 DIAGNOSIS — Z87891 Personal history of nicotine dependence: Secondary | ICD-10-CM | POA: Diagnosis not present

## 2015-06-26 DIAGNOSIS — E785 Hyperlipidemia, unspecified: Secondary | ICD-10-CM | POA: Diagnosis not present

## 2015-06-26 DIAGNOSIS — E669 Obesity, unspecified: Secondary | ICD-10-CM | POA: Diagnosis not present

## 2015-06-26 DIAGNOSIS — Z9181 History of falling: Secondary | ICD-10-CM | POA: Diagnosis not present

## 2015-06-26 DIAGNOSIS — E780 Pure hypercholesterolemia: Secondary | ICD-10-CM | POA: Insufficient documentation

## 2015-06-26 DIAGNOSIS — I4891 Unspecified atrial fibrillation: Secondary | ICD-10-CM | POA: Insufficient documentation

## 2015-06-26 DIAGNOSIS — R339 Retention of urine, unspecified: Secondary | ICD-10-CM | POA: Diagnosis present

## 2015-06-26 DIAGNOSIS — Z7901 Long term (current) use of anticoagulants: Secondary | ICD-10-CM | POA: Diagnosis not present

## 2015-06-26 DIAGNOSIS — R338 Other retention of urine: Secondary | ICD-10-CM | POA: Insufficient documentation

## 2015-06-26 DIAGNOSIS — R3915 Urgency of urination: Secondary | ICD-10-CM | POA: Diagnosis not present

## 2015-06-26 DIAGNOSIS — N183 Chronic kidney disease, stage 3 (moderate): Secondary | ICD-10-CM | POA: Diagnosis not present

## 2015-06-26 DIAGNOSIS — N3289 Other specified disorders of bladder: Secondary | ICD-10-CM | POA: Insufficient documentation

## 2015-06-26 HISTORY — PX: CYSTOSCOPY: SHX5120

## 2015-06-26 HISTORY — DX: Unspecified urinary incontinence: R32

## 2015-06-26 HISTORY — DX: Repeated falls: R29.6

## 2015-06-26 HISTORY — DX: Unspecified dementia, unspecified severity, without behavioral disturbance, psychotic disturbance, mood disturbance, and anxiety: F03.90

## 2015-06-26 HISTORY — DX: Personal history of transient ischemic attack (TIA), and cerebral infarction without residual deficits: Z86.73

## 2015-06-26 HISTORY — PX: INSERTION OF SUPRAPUBIC CATHETER: SHX5870

## 2015-06-26 LAB — CBC
HCT: 40.9 % (ref 39.0–52.0)
Hemoglobin: 13.2 g/dL (ref 13.0–17.0)
MCH: 28.4 pg (ref 26.0–34.0)
MCHC: 32.3 g/dL (ref 30.0–36.0)
MCV: 88.1 fL (ref 78.0–100.0)
PLATELETS: 493 10*3/uL — AB (ref 150–400)
RBC: 4.64 MIL/uL (ref 4.22–5.81)
RDW: 14.3 % (ref 11.5–15.5)
WBC: 7.9 10*3/uL (ref 4.0–10.5)

## 2015-06-26 LAB — BASIC METABOLIC PANEL
Anion gap: 9 (ref 5–15)
BUN: 22 mg/dL — AB (ref 6–20)
CO2: 27 mmol/L (ref 22–32)
CREATININE: 1.07 mg/dL (ref 0.61–1.24)
Calcium: 10 mg/dL (ref 8.9–10.3)
Chloride: 104 mmol/L (ref 101–111)
GFR calc Af Amer: >60 mL/min (ref >60)
GFR, EST NON AFRICAN AMERICAN: 60 mL/min — AB (ref >60)
Glucose, Bld: 122 mg/dL — ABNORMAL HIGH (ref 65–99)
Potassium: 4.3 mmol/L (ref 3.5–5.1)
SODIUM: 140 mmol/L (ref 135–145)

## 2015-06-26 SURGERY — INSERTION, SUPRAPUBIC CATHETER
Anesthesia: General

## 2015-06-26 MED ORDER — CEFAZOLIN SODIUM-DEXTROSE 2-3 GM-% IV SOLR
INTRAVENOUS | Status: AC
  Filled 2015-06-26: qty 50

## 2015-06-26 MED ORDER — CEFAZOLIN SODIUM-DEXTROSE 2-3 GM-% IV SOLR: 2.0000 g | INTRAVENOUS | Status: DC

## 2015-06-26 MED ORDER — SODIUM CHLORIDE 0.9 % IV SOLN
10.0000 mg | INTRAVENOUS | Status: DC | PRN
  Administered 2015-06-26: 10 ug/min via INTRAVENOUS

## 2015-06-26 MED ORDER — PHENYLEPHRINE HCL 10 MG/ML IJ SOLN
INTRAMUSCULAR | Status: DC | PRN
Start: 2015-06-26 — End: 2015-06-26
  Administered 2015-06-26 (×2): 40 ug via INTRAVENOUS

## 2015-06-26 MED ORDER — GENTAMICIN IN SALINE 1.6-0.9 MG/ML-% IV SOLN
80.0000 mg | Freq: Once | INTRAVENOUS | Status: AC
  Administered 2015-06-26: 80 mg via INTRAVENOUS
  Filled 2015-06-26: qty 50

## 2015-06-26 MED ORDER — PROPOFOL 10 MG/ML IV BOLUS
INTRAVENOUS | Status: DC | PRN
  Administered 2015-06-26 (×2): 10 mg via INTRAVENOUS
  Administered 2015-06-26: 100 mg via INTRAVENOUS
  Administered 2015-06-26: 10 mg via INTRAVENOUS

## 2015-06-26 MED ORDER — STERILE WATER FOR IRRIGATION IR SOLN
Status: DC | PRN
  Administered 2015-06-26: 3000 mL

## 2015-06-26 MED ORDER — BUPIVACAINE HCL (PF) 0.25 % IJ SOLN
INTRAMUSCULAR | Status: AC
  Filled 2015-06-26: qty 30

## 2015-06-26 MED ORDER — LIDOCAINE HCL 1 % IJ SOLN
INTRAMUSCULAR | Status: DC | PRN
  Administered 2015-06-26: 50 mg via INTRADERMAL

## 2015-06-26 MED ORDER — TRAMADOL HCL 50 MG PO TABS: 50.0000 mg | ORAL_TABLET | Freq: Four times a day (QID) | ORAL | Status: DC | PRN

## 2015-06-26 MED ORDER — FENTANYL CITRATE (PF) 100 MCG/2ML IJ SOLN: 25.0000 ug | INTRAMUSCULAR | Status: DC | PRN

## 2015-06-26 MED ORDER — PROPOFOL 10 MG/ML IV BOLUS
INTRAVENOUS | Status: AC
  Filled 2015-06-26: qty 20

## 2015-06-26 MED ORDER — ONDANSETRON HCL 4 MG/2ML IJ SOLN
INTRAMUSCULAR | Status: AC
Start: 2015-06-26 — End: 2015-06-26
  Filled 2015-06-26: qty 2

## 2015-06-26 MED ORDER — LIDOCAINE HCL (CARDIAC) 20 MG/ML IV SOLN
INTRAVENOUS | Status: AC
  Filled 2015-06-26: qty 5

## 2015-06-26 MED ORDER — FENTANYL CITRATE (PF) 100 MCG/2ML IJ SOLN
INTRAMUSCULAR | Status: DC | PRN
  Administered 2015-06-26 (×3): 25 ug via INTRAVENOUS

## 2015-06-26 MED ORDER — ONDANSETRON HCL 4 MG/2ML IJ SOLN: 4.0000 mg | Freq: Once | INTRAMUSCULAR | Status: DC | PRN

## 2015-06-26 MED ORDER — FENTANYL CITRATE (PF) 100 MCG/2ML IJ SOLN
INTRAMUSCULAR | Status: AC
  Filled 2015-06-26: qty 4

## 2015-06-26 MED ORDER — BUPIVACAINE HCL (PF) 0.25 % IJ SOLN
INTRAMUSCULAR | Status: DC | PRN
  Administered 2015-06-26: 10 mL

## 2015-06-26 MED ORDER — TRAMADOL HCL 50 MG PO TABS
50.0000 mg | ORAL_TABLET | Freq: Four times a day (QID) | ORAL | Status: DC | PRN
Start: 2015-06-26 — End: 2015-06-26

## 2015-06-26 MED ORDER — LACTATED RINGERS IV SOLN
INTRAVENOUS | Status: DC | PRN
  Administered 2015-06-26: 12:00:00 via INTRAVENOUS

## 2015-06-26 SURGICAL SUPPLY — 41 items
BAG URINE DRAINAGE (UROLOGICAL SUPPLIES) ×2 IMPLANT
BAG URO CATCHER STRL LF (DRAPE) ×3 IMPLANT
BASKET LASER NITINOL 1.9FR (BASKET) IMPLANT
BASKET STNLS GEMINI 4WIRE 3FR (BASKET) IMPLANT
BLADE CLIPPER SURG (BLADE) IMPLANT
BSKT STON RTRVL 120 1.9FR (BASKET)
BSKT STON RTRVL GEM 120X11 3FR (BASKET)
CATH BONANNO SUPRAPUBIC 14G (CATHETERS) IMPLANT
CATH FOLEY 2WAY SLVR  5CC 16FR (CATHETERS) ×2
CATH FOLEY 2WAY SLVR  5CC 18FR (CATHETERS) ×2
CATH FOLEY 2WAY SLVR 5CC 16FR (CATHETERS) IMPLANT
CATH FOLEY 2WAY SLVR 5CC 18FR (CATHETERS) IMPLANT
CATH FOLEY INTRO SUPRA 16F (CATHETERS) ×2 IMPLANT
CATH INTERMIT  6FR 70CM (CATHETERS) IMPLANT
DRAPE CAMERA CLOSED 9X96 (DRAPES) ×1 IMPLANT
ELECT PENCIL ROCKER SW 15FT (MISCELLANEOUS) IMPLANT
ELECT REM PT RETURN 9FT ADLT (ELECTROSURGICAL)
ELECTRODE REM PT RTRN 9FT ADLT (ELECTROSURGICAL) IMPLANT
EXTRACTOR STONE NITINOL NGAGE (UROLOGICAL SUPPLIES) IMPLANT
GLOVE BIO SURGEON STRL SZ8 (GLOVE) ×1 IMPLANT
GOWN STRL REUS W/TWL LRG LVL3 (GOWN DISPOSABLE) ×6 IMPLANT
GUIDEWIRE ANG ZIPWIRE 038X150 (WIRE) ×3 IMPLANT
GUIDEWIRE STR DUAL SENSOR (WIRE) ×1 IMPLANT
HOLDER FOLEY CATH W/STRAP (MISCELLANEOUS) IMPLANT
IV NS IRRIG 3000ML ARTHROMATIC (IV SOLUTION) IMPLANT
KIT SUPRAPUBIC CATH (MISCELLANEOUS) IMPLANT
MANIFOLD NEPTUNE II (INSTRUMENTS) ×3 IMPLANT
NDL HYPO 25X1 1.5 SAFETY (NEEDLE) ×1 IMPLANT
NEEDLE HYPO 25X1 1.5 SAFETY (NEEDLE) ×3 IMPLANT
PACK CYSTO (CUSTOM PROCEDURE TRAY) ×3 IMPLANT
PLUG CATH AND CAP STER (CATHETERS) ×2 IMPLANT
SPONGE DRAIN TRACH 4X4 STRL 2S (GAUZE/BANDAGES/DRESSINGS) ×8 IMPLANT
SUT ETHILON 2 0 PS N (SUTURE) ×2 IMPLANT
SUT SILK 0 (SUTURE)
SUT SILK 0 30XBRD TIE 6 (SUTURE) ×1 IMPLANT
SUT SILK 2 0 FS (SUTURE) IMPLANT
SYRINGE IRR TOOMEY STRL 70CC (SYRINGE) IMPLANT
TUBE FEEDING 8FR 16IN STR KANG (MISCELLANEOUS) IMPLANT
TUBING CONNECTING 10 (TUBING) ×2 IMPLANT
TUBING CONNECTING 10' (TUBING) ×1
WATER STERILE IRR 3000ML UROMA (IV SOLUTION) ×1 IMPLANT

## 2015-06-26 NOTE — Brief Op Note (Signed)
06/26/2015  12:47 PM  PATIENT:  Seth [name]  81 y.o. male  PRE-OPERATIVE DIAGNOSIS:  URINARY RETENTION  POST-OPERATIVE DIAGNOSIS:  URINARY RETENTION  PROCEDURE:  Procedure(s): INSERTION OF SUPRAPUBIC CATHETER (N/A) CYSTOSCOPY (N/A)  SURGEON:  Surgeon(s) and Role:    * Malen Gauze, MD - Primary  PHYSICIAN ASSISTANT:   ASSISTANTS: none   ANESTHESIA:   general  EBL:     BLOOD ADMINISTERED:none  DRAINS: Urinary Catheter (Foley) and Urinary Catheter (Suprapubic)   LOCAL MEDICATIONS USED:  BUPIVICAINE   SPECIMEN:  No Specimen  DISPOSITION OF SPECIMEN:  N/A  COUNTS:  YES  TOURNIQUET:  * No tourniquets in log *  DICTATION: .Note written in EPIC  PLAN OF CARE: Discharge to home after PACU  PATIENT DISPOSITION:  PACU - hemodynamically stable.   Delay start of Pharmacological VTE agent (>24hrs) due to surgical blood loss or risk of bleeding: not applicable

## 2015-06-26 NOTE — H&P (Signed)
Urology Admission H&P  Chief Complaint: urinary retention  History of Present Illness: Seth [name] is a 81yo with a hx of BPH and urinary retention here for SP tube placement. He requires chronic indwelling foley and has an issue with falls. He cannot take BPH meds due to hx of falls. He has poor mobility and requires a wheelchair.  Past Medical History  Diagnosis Date  . Hypertension   . Hyperlipidemia   . Aortic stenosis   . High cholesterol   . A-fib   . History of TIAs   . Multiple falls   . Urinary incontinence   . Dementia     short term memory loss   Past Surgical History  Procedure Laterality Date  . Hernia repair      1970s  . Back surgery  2006  . Cervical laminectomy  2002 ?    Home Medications:  Prescriptions prior to admission  Medication Sig Dispense Refill Last Dose  . acetaminophen (TYLENOL) 500 MG tablet Take 500 mg by mouth every 6 (six) hours as needed for mild pain.   06/22/2015  . acidophilus (RISAQUAD) CAPS capsule Take 1 capsule by mouth daily.   06/23/2015  . apixaban (ELIQUIS) 5 MG TABS tablet Take 2.5 mg by mouth 2 (two) times daily.   06/23/2015  . carbamide peroxide (DEBROX) 6.5 % otic solution Place 5 drops into both ears daily.     . cetaphil (CETAPHIL) lotion Apply 1 application topically daily.   06/22/2015  . metoprolol succinate (TOPROL-XL) 50 MG 24 hr tablet Take 50 mg by mouth every morning. Take with or immediately following a meal.   06/26/2015 at 0800  . mirtazapine (REMERON) 7.5 MG tablet Take 7.5 mg by mouth at bedtime.   06/25/2015 at pm  . Multiple Vitamin (MULTIVITAMIN WITH MINERALS) TABS tablet Take 1 tablet by mouth daily.   06/25/2015 at am  . pyridostigmine (MESTINON) 60 MG tablet Take 0.5 tablets (30 mg total) by mouth 3 (three) times daily. 45 tablet 6 06/25/2015 at pm  . senna (SENOKOT) 8.6 MG TABS tablet Take 1 tablet by mouth at bedtime.   06/25/2015 at am  . simvastatin (ZOCOR) 10 MG tablet Take 10 mg by mouth daily.    06/25/2015 at am  .  bisacodyl (DULCOLAX) 10 MG suppository Place 10 mg rectally as needed for mild constipation or moderate constipation.   Unknown at Unknown time  . ciprofloxacin (CIPRO) 500 MG tablet Take 1 tablet (500 mg total) by mouth 2 (two) times daily. (Patient not taking: Reported on 06/13/2015) 14 tablet 0    Allergies:  Allergies  Allergen Reactions  . Ceftin [Cefuroxime] Diarrhea    Severe diarrhea    Family History  Problem Relation Age of Onset  . Prostate cancer Brother    Social History:  reports that he has quit smoking. He has never used smokeless tobacco. He reports that he does not drink alcohol or use illicit drugs.  Review of Systems  Genitourinary: Positive for dysuria, urgency and frequency.  All other systems reviewed and are negative.   Physical Exam:  Vital signs in last 24 hours: Temp:  [97.9 F (36.6 C)] 97.9 F (36.6 C) (09/07 1048) Pulse Rate:  [64] 64 (09/07 1048) Resp:  [16] 16 (09/07 1048) BP: (129)/(75) 129/75 mmHg (09/07 1048) SpO2:  [98 %] 98 % (09/07 1048) Weight:  [76.8 kg (169 lb 5 oz)] 76.8 kg (169 lb 5 oz) (09/07 1048) Physical Exam  Constitutional: He is oriented  to person, place, and time. He appears well-developed and well-nourished.  HENT:  Head: Normocephalic and atraumatic.  Eyes: EOM are normal. Pupils are equal, round, and reactive to light.  Neck: Normal range of motion. No thyromegaly present.  Cardiovascular: Normal rate and regular rhythm.   Respiratory: Effort normal and breath sounds normal.  GI: Soft. He exhibits no distension.  Musculoskeletal: Normal range of motion. He exhibits no edema.  Neurological: He is alert and oriented to person, place, and time.  Skin: Skin is warm and dry.  Psychiatric: He has a normal mood and affect. His behavior is normal. Judgment and thought content normal.    Laboratory Data:  Results for orders placed or performed during the hospital encounter of 06/26/15 (from the past 24 hour(s))  CBC      Status: Abnormal   Collection Time: 06/26/15 10:30 AM  Result Value Ref Range   WBC 7.9 4.0 - 10.5 K/uL   RBC 4.64 4.22 - 5.81 MIL/uL   Hemoglobin 13.2 13.0 - 17.0 g/dL   HCT 16.1 09.6 - 04.5 %   MCV 88.1 78.0 - 100.0 fL   MCH 28.4 26.0 - 34.0 pg   MCHC 32.3 30.0 - 36.0 g/dL   RDW 40.9 81.1 - 91.4 %   Platelets 493 (H) 150 - 400 K/uL  Basic metabolic panel     Status: Abnormal   Collection Time: 06/26/15 10:30 AM  Result Value Ref Range   Sodium 140 135 - 145 mmol/L   Potassium 4.3 3.5 - 5.1 mmol/L   Chloride 104 101 - 111 mmol/L   CO2 27 22 - 32 mmol/L   Glucose, Bld 122 (H) 65 - 99 mg/dL   BUN 22 (H) 6 - 20 mg/dL   Creatinine, Ser 7.82 0.61 - 1.24 mg/dL   Calcium 95.6 8.9 - 21.3 mg/dL   GFR calc non Af Amer 60 (L) >60 mL/min   GFR calc Af Amer >60 >60 mL/min   Anion gap 9 5 - 15   No results found for this or any previous visit (from the past 240 hour(s)). Creatinine:  Recent Labs  06/26/15 1030  CREATININE 1.07    Impression/Assessment:  BPH with urinary retention  Plan:  1. The risks/benefits/alternatives to SP tube placement was explained to the patient and family and they understand and wish to proceed with surgery.   Seth [name] L 06/26/2015, 11:55 AM

## 2015-06-26 NOTE — Anesthesia Preprocedure Evaluation (Addendum)
Anesthesia Evaluation  Patient identified by MRN, date of birth, ID band Patient awake    Reviewed: Allergy & Precautions, NPO status , Patient's Chart, lab work & pertinent test results  History of Anesthesia Complications Negative for: history of anesthetic complications  Airway Mallampati: III  TM Distance: >3 FB Neck ROM: Full    Dental no notable dental hx. (+) Dental Advisory Given, Poor Dentition   Pulmonary former smoker,    Pulmonary exam normal breath sounds clear to auscultation       Cardiovascular hypertension, Pt. on medications and Pt. on home beta blockers Normal cardiovascular exam+ Valvular Problems/Murmurs AS  Rhythm:Regular Rate:Normal + Systolic murmurs    Neuro/Psych Has been seen by a neurologist and told that he has myastenia gravis. Her note seems to indicate that the testing does not support this. He has been progressively weak for > year and is wheelchair bound at this time.  TIAnegative psych ROS   GI/Hepatic negative GI ROS, Neg liver ROS,   Endo/Other  negative endocrine ROS  Renal/GU negative Renal ROS  negative genitourinary   Musculoskeletal negative musculoskeletal ROS (+)   Abdominal (+) + obese,   Peds negative pediatric ROS (+)  Hematology negative hematology ROS (+)   Anesthesia Other Findings   Reproductive/Obstetrics negative OB ROS                            Anesthesia Physical Anesthesia Plan  ASA: III  Anesthesia Plan: General   Post-op Pain Management:    Induction: Intravenous  Airway Management Planned: LMA  Additional Equipment:   Intra-op Plan:   Post-operative Plan: Extubation in OR and Possible Post-op intubation/ventilation  Informed Consent: I have reviewed the patients History and Physical, chart, labs and discussed the procedure including the risks, benefits and alternatives for the proposed anesthesia with the patient or  authorized representative who has indicated his/her understanding and acceptance.   Dental advisory given  Plan Discussed with: CRNA  Anesthesia Plan Comments: (Discussed with patient and daughter possibility of post op ventilation secondary to his possible myasthenia and weakness. They expressed an understanding of this and agreed to do what was best for him postoperatively)       Anesthesia Quick Evaluation

## 2015-06-26 NOTE — Anesthesia Procedure Notes (Signed)
Procedure Name: LMA Insertion Date/Time: 06/26/2015 12:15 PM Performed by: Durward Parcel A Pre-anesthesia Checklist: Patient identified, Emergency Drugs available, Suction available, Patient being monitored and Timeout performed Patient Re-evaluated:Patient Re-evaluated prior to inductionOxygen Delivery Method: Circle system utilized Preoxygenation: Pre-oxygenation with 100% oxygen Intubation Type: IV induction Ventilation: Mask ventilation without difficulty LMA: LMA inserted LMA Size: 5.0 Number of attempts: 1 Placement Confirmation: positive ETCO2 and breath sounds checked- equal and bilateral Tube secured with: Tape Dental Injury: Teeth and Oropharynx as per pre-operative assessment

## 2015-06-26 NOTE — Progress Notes (Addendum)
Paged Dr Ronne Binning with question about indwelling foley catheter which is plugged. Patient has received supra pubic catheter today. RN would like to know how to instruct [facility] and family in care of indwelling foley catheter which is plugged.  Dr Ronne Binning calls back. Plugged foley catheter is in as a backup for supra pubic catheter. It will be removed next Tuesday.  1606 PM  Called report to [place] at [facility] with report of supra pubic catheter to leg bag. Foley catheter is plugged and to be removed next Tuesday at Dr Dimas Millin office.

## 2015-06-26 NOTE — Discharge Instructions (Signed)
Suprapubic Catheter Home Guide A suprapubic catheter is a rubber tube with a tiny balloon on the end. It is used to drain urine from the bladder. This catheter is put in your bladder through a small opening in the lower center part of your abdomen. Suprapubic refers to the area right above your pubic bone. The balloon on the end of the catheter is filled with germ-free (sterile) water. This keeps the catheter from slipping out. When the catheter is in place, your urine will drain into a collection bag. The bag can be put beside your bed at night or attached to your leg during the day. HOW TO CARE FOR YOUR CATHETER  Cleaning your skin  Clean the skin around the catheter opening every day.  Wash your hands with soap and water.  Clean the skin around the opening with a clean washcloth and soapy water. Do not pull on the tube.  Pat the area dry with a clean towel.  Your caregiver may want you to put a bandage (dressing) over the site. Do not use ointment on this area unless your caregiver tells you to. Cleaning the catheter  Ask your caregiver if you need to clean the catheter and how often.  Use only soap and water.  There may be crusts on the catheter. Put hydrogen peroxide on a cotton ball or gauze pad to remove any crust. Emptying the collection bag  You may have a large drainage bag to use at night and a smaller one for daytime. Empty the large bag every 8 hours. Empty the small bag when it is about  full.  Keep the drainage bag below the level of the catheter. This keeps urine from flowing backwards.  Hold the bag over the toilet or another container. Release the valve (spigot) at the bottom of the bag. Do not touch the opening of the spigot. Do not let the opening touch the toilet or container.  Close the spigot tightly when the bag is empty. Cleaning the collection bag  Clean the bag every few days.  First, wash your hands.  Disconnect the tubing from the catheter. Replace  the used bag with a new bag. Then you can clean the used one.  Empty the used bag completely. Rinse it out with warm water and soap or fill the bag with water and add 1 teaspoon of vinegar. Let it sit for about 30 minutes. Then drain.  The bag should be completely dry before storing it. Put it inside a plastic bag to keep it clean. Checking everything  Always make sure there are no kinks in the catheter or tubing.  Always make sure there are no leaks in the catheter, tubing, or collection bag. HOW TO CHANGE YOUR CATHETER Sometimes, a caregiver will change your suprapubic catheter. Other times, you may need to change it yourself. This may be the case if you need to wear a catheter for a Sonier time. Usually, they need to be changed every 4 to 6 weeks. Ask your caregiver how often yours should be changed. Your caregiver will help you order the following supplies for home delivery:  Sterile gloves.  Catheters.  Syringes.  Sterile water.  Sterile cleaning solution.  Lubricant.  Drainage bags. Changing your catheter  Drink plenty of fluids before changing the catheter.  Wash your hands with soap and water.  Lie on your back and put on sterile gloves.  Clean the skin around the catheter opening. Use the sterile cleaning solution.  Use  a syringe to get the water out of the balloon from the old catheter.  Slowly remove the catheter.  Take off the first pair of gloves, and put on a new pair. Then put lubricant on the tip of the new catheter. Put the new catheter through the opening.  Wait for some urine to start flowing. Then, use the other syringe to fill the balloon with sterile water.  Attach the catheter to your drainage bag. Make sure the connection is tight. Important warnings  The catheter should come out easily. If it seems stuck, do not pull it.  Call your caregiver right away if you have any trouble while changing the catheter.  When the old catheter is removed, the  new one should be put in right away. This is because the opening will close quickly. If you have a problem, go to an emergency clinic right away. RISKS AND COMPLICATIONS  Urine flow can become blocked. This can happen if the catheter or tubes are not working right. A blood clot can also block urine flow.  The catheter might irritate tissue in your body. This can cause bleeding.  The skin near the opening for the catheter may become irritated or infected.  Bacteria may get into your bladder. This can cause a urinary tract infection. HOME CARE INSTRUCTIONS  Take all medicines prescribed by your caregiver. Follow the directions carefully.  Drink 8 glasses of water every day. This produces good urine flow.  Check the skin around your catheter a few times every day. Watch for redness and swelling. Look for any fluids coming out of the opening.  Do not use powder or cream around the catheter opening.  Do not take tub baths or use pools or hot tubs.  Keep all follow-up appointments. SEEK MEDICAL CARE IF:  You leak urine.  Your skin around the catheter becomes red or sore.  Your urine flow slows down.  Your urine gets cloudy or smelly. SEEK IMMEDIATE MEDICAL CARE IF:   You have chills, nausea, or back pain.  You have trouble changing your catheter.  Your catheter comes out.  You have blood in your urine.  You have no urine flow for 1 hour.  You have a fever. Document Released: 06/23/2011 Document Revised: 12/28/2011 Document Reviewed: 06/23/2011 [facility] Patient Information 2015 [place], [place]. This information is not intended to replace advice given to you by your health care provider. Make sure you discuss any questions you have with your health care provider.       General Anesthesia, Care After Refer to this sheet in the next few weeks. These instructions provide you with information on caring for yourself after your procedure. Your health care provider may also  give you more specific instructions. Your treatment has been planned according to current medical practices, but problems sometimes occur. Call your health care provider if you have any problems or questions after your procedure. WHAT TO EXPECT AFTER THE PROCEDURE After the procedure, it is typical to experience:  Sleepiness.  Nausea and vomiting. HOME CARE INSTRUCTIONS  For the first 24 hours after general anesthesia:  Have a responsible person with you.  Do not drive a car. If you are alone, do not take public transportation.  Do not drink alcohol.  Do not take medicine that has not been prescribed by your health care provider.  Do not sign important papers or make important decisions.  You may resume a normal diet and activities as directed by your health care provider.  Change bandages (dressings) as directed.  If you have questions or problems that seem related to general anesthesia, call the hospital and ask for the anesthetist or anesthesiologist on call. SEEK MEDICAL CARE IF:  You have nausea and vomiting that continue the day after anesthesia.  You develop a rash. SEEK IMMEDIATE MEDICAL CARE IF:   You have difficulty breathing.  You have chest pain.  You have any allergic problems. Document Released: 01/11/2001 Document Revised: 10/10/2013 Document Reviewed: 04/20/2013 [facility] Patient Information 2015 [place], [place]. This information is not intended to replace advice given to you by your health care provider. Make sure you discuss any questions you have with your health care provider.

## 2015-06-26 NOTE — Anesthesia Postprocedure Evaluation (Signed)
  Anesthesia Post-op Note  Patient: Seth [name]  Procedure(s) Performed: Procedure(s) (LRB): INSERTION OF SUPRAPUBIC CATHETER (N/A) CYSTOSCOPY (N/A)  Patient Location: PACU  Anesthesia Type: General  Level of Consciousness: awake and alert   Airway and Oxygen Therapy: Patient Spontanous Breathing  Post-op Pain: mild  Post-op Assessment: Post-op Vital signs reviewed, Patient's Cardiovascular Status Stable, Respiratory Function Stable, Patent Airway and No signs of Nausea or vomiting  Last Vitals:  Filed Vitals:   06/26/15 1624  BP: 156/55  Pulse: 56  Temp: 36.6 C  Resp: 16    Post-op Vital Signs: stable   Complications: No apparent anesthesia complications

## 2015-06-26 NOTE — Transfer of Care (Signed)
Immediate Anesthesia Transfer of Care Note  Patient: Seth [name]  Procedure(s) Performed: Procedure(s): INSERTION OF SUPRAPUBIC CATHETER (N/A) CYSTOSCOPY (N/A)  Patient Location: PACU  Anesthesia Type:General  Level of Consciousness: awake, alert  and oriented  Airway & Oxygen Therapy: Patient Spontanous Breathing and Patient connected to face mask oxygen  Post-op Assessment: Report given to RN and Post -op Vital signs reviewed and stable  Post vital signs: Reviewed and stable  Last Vitals:  Filed Vitals:   06/26/15 1256  BP:   Pulse:   Temp:   Resp: 6    Complications: No apparent anesthesia complications

## 2015-06-27 ENCOUNTER — Encounter (HOSPITAL_COMMUNITY): Payer: Self-pay | Admitting: Urology

## 2015-06-27 DIAGNOSIS — S0101XD Laceration without foreign body of scalp, subsequent encounter: Secondary | ICD-10-CM | POA: Diagnosis not present

## 2015-06-27 DIAGNOSIS — R42 Dizziness and giddiness: Secondary | ICD-10-CM | POA: Diagnosis not present

## 2015-06-27 DIAGNOSIS — Z8673 Personal history of transient ischemic attack (TIA), and cerebral infarction without residual deficits: Secondary | ICD-10-CM | POA: Diagnosis not present

## 2015-06-27 DIAGNOSIS — I10 Essential (primary) hypertension: Secondary | ICD-10-CM | POA: Diagnosis not present

## 2015-06-27 DIAGNOSIS — I480 Paroxysmal atrial fibrillation: Secondary | ICD-10-CM | POA: Diagnosis not present

## 2015-06-27 DIAGNOSIS — Z7901 Long term (current) use of anticoagulants: Secondary | ICD-10-CM | POA: Diagnosis not present

## 2015-06-28 DIAGNOSIS — I480 Paroxysmal atrial fibrillation: Secondary | ICD-10-CM | POA: Diagnosis not present

## 2015-06-28 DIAGNOSIS — S0101XD Laceration without foreign body of scalp, subsequent encounter: Secondary | ICD-10-CM | POA: Diagnosis not present

## 2015-06-28 DIAGNOSIS — Z8673 Personal history of transient ischemic attack (TIA), and cerebral infarction without residual deficits: Secondary | ICD-10-CM | POA: Diagnosis not present

## 2015-06-28 DIAGNOSIS — R42 Dizziness and giddiness: Secondary | ICD-10-CM | POA: Diagnosis not present

## 2015-06-28 DIAGNOSIS — I10 Essential (primary) hypertension: Secondary | ICD-10-CM | POA: Diagnosis not present

## 2015-06-28 DIAGNOSIS — Z7901 Long term (current) use of anticoagulants: Secondary | ICD-10-CM | POA: Diagnosis not present

## 2015-07-01 DIAGNOSIS — Z8673 Personal history of transient ischemic attack (TIA), and cerebral infarction without residual deficits: Secondary | ICD-10-CM | POA: Diagnosis not present

## 2015-07-01 DIAGNOSIS — S0101XD Laceration without foreign body of scalp, subsequent encounter: Secondary | ICD-10-CM | POA: Diagnosis not present

## 2015-07-01 DIAGNOSIS — R42 Dizziness and giddiness: Secondary | ICD-10-CM | POA: Diagnosis not present

## 2015-07-01 DIAGNOSIS — Z7901 Long term (current) use of anticoagulants: Secondary | ICD-10-CM | POA: Diagnosis not present

## 2015-07-01 DIAGNOSIS — I10 Essential (primary) hypertension: Secondary | ICD-10-CM | POA: Diagnosis not present

## 2015-07-01 DIAGNOSIS — I480 Paroxysmal atrial fibrillation: Secondary | ICD-10-CM | POA: Diagnosis not present

## 2015-07-01 NOTE — Progress Notes (Signed)
No Show

## 2015-07-02 DIAGNOSIS — E785 Hyperlipidemia, unspecified: Secondary | ICD-10-CM | POA: Diagnosis not present

## 2015-07-02 DIAGNOSIS — M6281 Muscle weakness (generalized): Secondary | ICD-10-CM | POA: Diagnosis not present

## 2015-07-02 DIAGNOSIS — N390 Urinary tract infection, site not specified: Secondary | ICD-10-CM | POA: Diagnosis not present

## 2015-07-02 DIAGNOSIS — K5900 Constipation, unspecified: Secondary | ICD-10-CM | POA: Diagnosis not present

## 2015-07-02 DIAGNOSIS — N400 Enlarged prostate without lower urinary tract symptoms: Secondary | ICD-10-CM | POA: Diagnosis not present

## 2015-07-02 DIAGNOSIS — I4891 Unspecified atrial fibrillation: Secondary | ICD-10-CM | POA: Diagnosis not present

## 2015-07-02 DIAGNOSIS — I10 Essential (primary) hypertension: Secondary | ICD-10-CM | POA: Diagnosis not present

## 2015-07-02 DIAGNOSIS — M170 Bilateral primary osteoarthritis of knee: Secondary | ICD-10-CM | POA: Diagnosis not present

## 2015-07-03 ENCOUNTER — Encounter (HOSPITAL_COMMUNITY): Payer: Self-pay | Admitting: Emergency Medicine

## 2015-07-03 ENCOUNTER — Encounter: Payer: Self-pay | Admitting: Neurology

## 2015-07-03 ENCOUNTER — Emergency Department (HOSPITAL_COMMUNITY): Payer: Medicare Other

## 2015-07-03 ENCOUNTER — Inpatient Hospital Stay (HOSPITAL_COMMUNITY)
Admission: EM | Admit: 2015-07-03 | Discharge: 2015-07-07 | DRG: 689 | Disposition: A | Discharge status: Skilled Nursing Facility | Payer: Medicare Other | Attending: Internal Medicine | Admitting: Internal Medicine

## 2015-07-03 ENCOUNTER — Ambulatory Visit (INDEPENDENT_AMBULATORY_CARE_PROVIDER_SITE_OTHER): Payer: Medicare Other | Admitting: Neurology

## 2015-07-03 VITALS — BP 137/102 | HR 105 | Temp 97.3°F | Ht 70.0 in

## 2015-07-03 DIAGNOSIS — I10 Essential (primary) hypertension: Secondary | ICD-10-CM | POA: Diagnosis present

## 2015-07-03 DIAGNOSIS — N390 Urinary tract infection, site not specified: Principal | ICD-10-CM | POA: Diagnosis present

## 2015-07-03 DIAGNOSIS — L8990 Pressure ulcer of unspecified site, unspecified stage: Secondary | ICD-10-CM | POA: Insufficient documentation

## 2015-07-03 DIAGNOSIS — F05 Delirium due to known physiological condition: Secondary | ICD-10-CM | POA: Diagnosis not present

## 2015-07-03 DIAGNOSIS — G7000 Myasthenia gravis without (acute) exacerbation: Secondary | ICD-10-CM | POA: Diagnosis present

## 2015-07-03 DIAGNOSIS — Z87891 Personal history of nicotine dependence: Secondary | ICD-10-CM

## 2015-07-03 DIAGNOSIS — K5641 Fecal impaction: Secondary | ICD-10-CM | POA: Diagnosis not present

## 2015-07-03 DIAGNOSIS — R4182 Altered mental status, unspecified: Secondary | ICD-10-CM | POA: Diagnosis not present

## 2015-07-03 DIAGNOSIS — Z9181 History of falling: Secondary | ICD-10-CM

## 2015-07-03 DIAGNOSIS — R1030 Lower abdominal pain, unspecified: Secondary | ICD-10-CM | POA: Diagnosis not present

## 2015-07-03 DIAGNOSIS — Z881 Allergy status to other antibiotic agents status: Secondary | ICD-10-CM

## 2015-07-03 DIAGNOSIS — E780 Pure hypercholesterolemia: Secondary | ICD-10-CM | POA: Diagnosis present

## 2015-07-03 DIAGNOSIS — Z8673 Personal history of transient ischemic attack (TIA), and cerebral infarction without residual deficits: Secondary | ICD-10-CM

## 2015-07-03 DIAGNOSIS — I4891 Unspecified atrial fibrillation: Secondary | ICD-10-CM | POA: Diagnosis not present

## 2015-07-03 DIAGNOSIS — I350 Nonrheumatic aortic (valve) stenosis: Secondary | ICD-10-CM | POA: Diagnosis present

## 2015-07-03 DIAGNOSIS — N179 Acute kidney failure, unspecified: Secondary | ICD-10-CM | POA: Diagnosis not present

## 2015-07-03 DIAGNOSIS — E872 Acidosis: Secondary | ICD-10-CM | POA: Diagnosis present

## 2015-07-03 DIAGNOSIS — I129 Hypertensive chronic kidney disease with stage 1 through stage 4 chronic kidney disease, or unspecified chronic kidney disease: Secondary | ICD-10-CM | POA: Diagnosis present

## 2015-07-03 DIAGNOSIS — E860 Dehydration: Secondary | ICD-10-CM | POA: Diagnosis present

## 2015-07-03 DIAGNOSIS — Z79899 Other long term (current) drug therapy: Secondary | ICD-10-CM

## 2015-07-03 DIAGNOSIS — Z66 Do not resuscitate: Secondary | ICD-10-CM | POA: Diagnosis present

## 2015-07-03 DIAGNOSIS — E785 Hyperlipidemia, unspecified: Secondary | ICD-10-CM | POA: Diagnosis present

## 2015-07-03 DIAGNOSIS — Z23 Encounter for immunization: Secondary | ICD-10-CM

## 2015-07-03 DIAGNOSIS — F0390 Unspecified dementia without behavioral disturbance: Secondary | ICD-10-CM | POA: Diagnosis present

## 2015-07-03 DIAGNOSIS — R0902 Hypoxemia: Secondary | ICD-10-CM

## 2015-07-03 DIAGNOSIS — Z7901 Long term (current) use of anticoagulants: Secondary | ICD-10-CM

## 2015-07-03 DIAGNOSIS — N183 Chronic kidney disease, stage 3 unspecified: Secondary | ICD-10-CM | POA: Diagnosis present

## 2015-07-03 DIAGNOSIS — G9340 Encephalopathy, unspecified: Secondary | ICD-10-CM | POA: Diagnosis not present

## 2015-07-03 DIAGNOSIS — Z7952 Long term (current) use of systemic steroids: Secondary | ICD-10-CM

## 2015-07-03 DIAGNOSIS — T443X5A Adverse effect of other parasympatholytics [anticholinergics and antimuscarinics] and spasmolytics, initial encounter: Secondary | ICD-10-CM | POA: Diagnosis present

## 2015-07-03 DIAGNOSIS — R109 Unspecified abdominal pain: Secondary | ICD-10-CM

## 2015-07-03 DIAGNOSIS — K8020 Calculus of gallbladder without cholecystitis without obstruction: Secondary | ICD-10-CM | POA: Diagnosis not present

## 2015-07-03 LAB — URINALYSIS, ROUTINE W REFLEX MICROSCOPIC
Glucose, UA: NEGATIVE mg/dL
Ketones, ur: 15 mg/dL — AB
NITRITE: NEGATIVE
PH: 5 (ref 5.0–8.0)
Protein, ur: 100 mg/dL — AB
SPECIFIC GRAVITY, URINE: 1.024 (ref 1.005–1.030)
Urobilinogen, UA: 1 mg/dL (ref 0.0–1.0)

## 2015-07-03 LAB — COMPREHENSIVE METABOLIC PANEL
ALK PHOS: 106 U/L (ref 38–126)
ALT: 22 U/L (ref 17–63)
AST: 30 U/L (ref 15–41)
Albumin: 3.4 g/dL — ABNORMAL LOW (ref 3.5–5.0)
Anion gap: 13 (ref 5–15)
BUN: 15 mg/dL (ref 6–20)
CALCIUM: 10 mg/dL (ref 8.9–10.3)
CHLORIDE: 102 mmol/L (ref 101–111)
CO2: 25 mmol/L (ref 22–32)
CREATININE: 1.33 mg/dL — AB (ref 0.61–1.24)
GFR calc Af Amer: 53 mL/min — ABNORMAL LOW (ref >60)
GFR calc non Af Amer: 46 mL/min — ABNORMAL LOW (ref >60)
Glucose, Bld: 115 mg/dL — ABNORMAL HIGH (ref 65–99)
Potassium: 3.8 mmol/L (ref 3.5–5.1)
SODIUM: 140 mmol/L (ref 135–145)
Total Bilirubin: 0.9 mg/dL (ref 0.3–1.2)
Total Protein: 7.8 g/dL (ref 6.5–8.1)

## 2015-07-03 LAB — CBC WITH DIFFERENTIAL/PLATELET
BASOS ABS: 0 10*3/uL (ref 0.0–0.1)
Basophils Relative: 0 %
EOS PCT: 1 %
Eosinophils Absolute: 0.1 10*3/uL (ref 0.0–0.7)
HCT: 44.7 % (ref 39.0–52.0)
HEMOGLOBIN: 14.9 g/dL (ref 13.0–17.0)
LYMPHS PCT: 13 %
Lymphs Abs: 1.6 10*3/uL (ref 0.7–4.0)
MCH: 28.3 pg (ref 26.0–34.0)
MCHC: 33.3 g/dL (ref 30.0–36.0)
MCV: 84.8 fL (ref 78.0–100.0)
Monocytes Absolute: 1 10*3/uL (ref 0.1–1.0)
Monocytes Relative: 8 %
NEUTROS PCT: 77 %
Neutro Abs: 9.5 10*3/uL — ABNORMAL HIGH (ref 1.7–7.7)
PLATELETS: 492 10*3/uL — AB (ref 150–400)
RBC: 5.27 MIL/uL (ref 4.22–5.81)
RDW: 14.5 % (ref 11.5–15.5)
WBC: 12.2 10*3/uL — AB (ref 4.0–10.5)

## 2015-07-03 LAB — I-STAT TROPONIN, ED: Troponin i, poc: 0.02 ng/mL (ref 0.00–0.08)

## 2015-07-03 LAB — URINE MICROSCOPIC-ADD ON

## 2015-07-03 MED ORDER — PYRIDOSTIGMINE BROMIDE 60 MG PO TABS: 60.0000 mg | ORAL_TABLET | Freq: Three times a day (TID) | ORAL | Status: DC

## 2015-07-03 MED ORDER — PREDNISONE 10 MG PO TABS: 20.0000 mg | ORAL_TABLET | Freq: Every day | ORAL | Status: DC

## 2015-07-03 MED ORDER — FENTANYL CITRATE (PF) 100 MCG/2ML IJ SOLN
25.0000 ug | Freq: Once | INTRAMUSCULAR | Status: AC
  Administered 2015-07-03: 25 ug via INTRAVENOUS
  Filled 2015-07-03: qty 2

## 2015-07-03 MED ORDER — PREDNISONE 10 MG PO TABS: 10.0000 mg | ORAL_TABLET | Freq: Every day | ORAL | Status: DC

## 2015-07-03 MED ORDER — SODIUM CHLORIDE 0.9 % IV SOLN
Freq: Once | INTRAVENOUS | Status: AC
  Administered 2015-07-04: via INTRAVENOUS

## 2015-07-03 NOTE — ED Notes (Addendum)
Ems temp 98.4, p 100, rr 20, bp 144/92. o2 was in high 80s on room air, applied 4L up to 92%, does not wear o2 at home. cbg 133. 1st degree on ekg with ems.

## 2015-07-03 NOTE — ED Provider Notes (Signed)
CSN: [id number]     Arrival date & time 07/03/15  2038 History   First MD Initiated Contact with Patient 07/03/15 2038     Chief Complaint  Patient presents with  . Altered Mental Status     (Consider location/radiation/quality/duration/timing/severity/associated sxs/prior Treatment) HPI Seth [name] is a 81 y.o. male with history of hypertension, aortic stenosis, A. fib, TIAs, dementia, presents to emergency department complaining of altered mental status, weakness. Patient is coming from [facility]. According to family, patient has been progressively more confused and weaker. He has been moaning out and screaming in pain yesterday and today. Per family patient has also had some "drooling from the mouth." Patient has not eaten in 2 days, family stated he only had ice cream yesterday otherwise he has been drinking water, but not enough. Patient has been complaining of lower abdominal pain. Patient with urinary retention, had suprapubic catheter placed one week ago. Had Foley catheter taken out 4 days ago. Patient has history of frequent UTIs and family believes he may have another urinary tract infection. Per nursing home, patient has not had any fever, chills, vomiting. Family stated that the abdominal pain comes and goes. Patient will be symptom free, and the next minute he would be screaming out in pain.   Past Medical History  Diagnosis Date  . Hypertension   . Hyperlipidemia   . Aortic stenosis   . High cholesterol   . A-fib   . History of TIAs   . Multiple falls   . Urinary incontinence   . Dementia     short term memory loss   Past Surgical History  Procedure Laterality Date  . Hernia repair      1970s  . Back surgery  2006  . Cervical laminectomy  2002 ?  [name] Insertion of suprapubic catheter N/A 06/26/2015    Procedure: INSERTION OF SUPRAPUBIC CATHETER;  Surgeon: Malen Gauze, MD;  Location: [facility];  Service: Urology;  Laterality: N/A;  . Cystoscopy  N/A 06/26/2015    Procedure: CYSTOSCOPY;  Surgeon: Malen Gauze, MD;  Location: [facility];  Service: Urology;  Laterality: N/A;   Family History  Problem Relation Age of Onset  . Prostate cancer Brother    Social History  Substance Use Topics  . Smoking status: Former Games developer  . Smokeless tobacco: Never Used  . Alcohol Use: No    Review of Systems  Unable to perform ROS: Dementia  Gastrointestinal: Positive for abdominal pain. Negative for nausea and vomiting.      Allergies  Ceftin  Home Medications   Prior to Admission medications   Medication Sig Start Date End Date Taking? Authorizing Provider  acetaminophen (TYLENOL) 500 MG tablet Take 500 mg by mouth every 6 (six) hours as needed for mild pain. NOT on nursing home MAR   Yes Historical Provider, MD  apixaban (ELIQUIS) 2.5 MG TABS tablet Take 2.5 mg by mouth 2 (two) times daily.   Yes Historical Provider, MD  Cholecalciferol (VITAMIN D3) 2000 UNITS TABS Take 2,000 Units by mouth daily.    Yes Historical Provider, MD  Emollient (CETAPHIL) cream Apply 1 application topically as needed (for feet).   Yes Historical Provider, MD  metoprolol succinate (TOPROL-XL) 50 MG 24 hr tablet Take 50 mg by mouth every morning. Take with or immediately following a meal.   Yes Historical Provider, MD  mirtazapine (REMERON) 7.5 MG tablet Take 7.5 mg by mouth at bedtime.   Yes Historical Provider, MD  Multiple Vitamin (MULTIVITAMIN WITH MINERALS) TABS tablet Take 1 tablet by mouth daily.   Yes Historical Provider, MD  MYRBETRIQ 25 MG TB24 tablet Take 25 mg by mouth daily. 06/29/15  Yes Historical Provider, MD  pyridostigmine (MESTINON) 60 MG tablet Take 1 tablet (60 mg total) by mouth 3 (three) times daily. Patient taking differently: Take 30 mg by mouth 3 (three) times daily.  07/03/15  Yes Anson Fret, MD  senna (SENOKOT) 8.6 MG TABS tablet Take 1 tablet by mouth at bedtime.   Yes Historical Provider, MD  simvastatin (ZOCOR) 10 MG tablet  Take 10 mg by mouth daily.    Yes Historical Provider, MD  traMADol (ULTRAM) 50 MG tablet Take 1 tablet (50 mg total) by mouth every 6 (six) hours as needed. 06/26/15  Yes Malen Gauze, MD  predniSONE (DELTASONE) 10 MG tablet Take 1 tablet (10 mg total) by mouth daily with breakfast. 07/03/15   Anson Fret, MD   BP 141/84 mmHg  Pulse 93  Temp(Src) 98.1 F (36.7 C) (Oral)  Resp 19  SpO2 97% Physical Exam  Constitutional: He appears well-developed and well-nourished. No distress.  HENT:  Head: Normocephalic and atraumatic.  Eyes: Conjunctivae are normal.  Neck: Normal range of motion. Neck supple.  No meningismus  Cardiovascular: Normal rate and regular rhythm.   Murmur heard. Pulmonary/Chest: Effort normal. No respiratory distress. He has no wheezes. He has no rales.  Abdominal: Soft. Bowel sounds are normal. He exhibits no distension. There is no tenderness. There is no rebound.  Suprapubic catheter in place. There is mild erythema just around the opening, no signs of infection, no drainage, no bleeding.  Musculoskeletal: He exhibits no edema.  Neurological: He is alert. No cranial nerve deficit.  Oriented to self and place. Unable to recall the year. Follows directions appropriately. 5 out of 5 and equal strength bilaterally in upper and lower extremities.  Skin: Skin is warm and dry.  Psychiatric: He has a normal mood and affect.  Nursing note and vitals reviewed.   ED Course  Procedures (including critical care time) Labs Review Labs Reviewed  CBC WITH DIFFERENTIAL/PLATELET  COMPREHENSIVE METABOLIC PANEL  URINALYSIS, ROUTINE W REFLEX MICROSCOPIC (NOT AT [facility])  I-STAT TROPOININ, ED  I-STAT CG4 LACTIC ACID, ED    Imaging Review No results found. I have personally reviewed and evaluated these images and lab results as part of my medical decision-making.   EKG Interpretation   Date/Time:  Wednesday July 03 2015 20:50:10 EDT Ventricular Rate:  93 PR  Interval:  224 QRS Duration: 102 QT Interval:  356 QTC Calculation: 443 R Axis:   63 Text Interpretation:  Sinus rhythm Prolonged PR interval Abnormal T,  consider ischemia, lateral leads Confirmed by COOK  MD, BRIAN ([id number]) on  07/03/2015 9:16:16 PM      MDM   Final diagnoses:  None   Pt in ED with generalized weakness, no appetite, lower abdominal pain, worsening confusion. Was foind to have O2 in 80s on RA. No cough, no CP. Will get labs, CXR, ua.   Chest x-ray is negative. Patient's oxygen saturation comes up as you encouraged him to take deeper breaths through his nose. He still drops to 80s if turn off the oxygen. His lungs are clear on exam. Patient appears to be somnolent. He has been screaming out in pain several times, but now ordered for pain. We will get CT abdomen and pelvis for further evaluation and CT of the head given no  clear finding for his altered mental status.    12:55 AM CT head negative. CT abdomen and pelvis showing constipation with fecal impaction within the rectum. I disimpacted patient will large amount of stool out. Will administer enema to help with the rest of the constipation and impaction. Given patient's altered mental status, weakness, hypoxia, will admit for further evaluation and to monitor.   Jaynie Crumble, PA-C 07/04/15 2203  Donnetta Hutching, MD 07/07/15 [phone]

## 2015-07-03 NOTE — ED Notes (Signed)
Per EMS, from [facility], had a foley cath taken out, and had surgical suprapubic cath placed yesterday. Family called because patient was not "actiing right". Alert and oriented with ems. Left sided facial droop.

## 2015-07-03 NOTE — Progress Notes (Signed)
Fax Rx Mestinon  (1 tablet 3 times per day) to [facility], where the pt is staying per Dr. Lucia Gaskins request. Fax number: [phone]. Received fax confirmation.

## 2015-07-03 NOTE — ED Notes (Signed)
Patient transported to CT scan . 

## 2015-07-03 NOTE — Op Note (Signed)
Preoperative diagnosis: BPH, urinary retention  Postop diagnosis: Same  Procedure: 1.  Cystoscopy 2. Suprapubic tube placement  Attending: Wilkie Aye  Anesthesia: General  Estimated blood loss: 5 cc  Drains: 1. 18 French Foley catheter 2. 16 French SP tube  Specimens: none  Antibiotics: zosyn  Findings: trilobar hypertrophy. Severe bladder trabeculations. No masses/lesions in the bladder.  Indications: Patient is a 81 year old with a history of  BPH and urinary retention requiring chronic foley catheter. The patient wishes to proceed with SP tube placement..   After discussing treatment options patient decided to proceed with SP tube placement.  Procedure in detail: Prior to procedure consetn was obtained. Patient was brought to the operating room and briefing was done sure correct patient, correct procedure, correct site.  General anesthesia was in administered patient was placed in the dorsal lithotomy position.  The rigid 22 French cystoscope was passed urethra and bladder.  Bladder was inspected masses or lesions and none were found. We then filled the bladder to approximately 500cc until it was easily palpable on the abdominal wall. We then made a 1cm incision 2cm above the pubic symphysis. Using the [facility] suprapubic tube introducer through the suprapubic incision we entered the bladder at the dome under direct vision. We removed the inner cannula and placed a 16 french foley through the sheath and into the bladder. We then filled the balloon with 10cc of water. The sheath was then removed and the SP tube was secured with a 0 silk to the skin.  bladder was then drained and a 18 French Foley catheter was placed. This concluded the procedure which resulted by the patient.  Complications: None  Condition: Stable,  extubated, transferred to PACU.  Plan: Patient is to be discharged home. He is to followup in 1 week for foley catheter removal

## 2015-07-03 NOTE — Progress Notes (Addendum)
[facility]    Provider:  Dr Lucia Gaskins Referring Provider: Gildardo Cranker, MD Primary Care Physician:   Duane Lope, MD  CC: Confusion, falls, loss of consciousness, dizziness, weakness  Interval update 07/03/2015:  Seth [name] is a 81 y.o. male here as a referral from Dr. Tenny Craw for dizziness, concussion. Past medical history of hypertension, dyslipidemia, atrial fibrillation (on AC with Eliquis), possibly aortic stenosis. He has had 9 falls. 2 of them took him to the emergency room. He has been having falls for about a year.Weakness over the last 1 year with a decline over the last 2 months since the falls.   Patient is here for follow-up on lab testing which revealed positive acetylcholine blocking antibodies. He was started on Mestinon at last appointment. When he takes the mestinon, the family see a difference. He can pick up his feet better and can move his arms. He still can't move himself around in his chair. But still feels that the medicine did help. He can pick up his feet. He last took the mestinon just before he came over. He just had a suprapubic cath and he is feeling bad. No side effects from the Mestinon. He is having stomach cramps and has not had a bowel movement for 5 days. However since starting the mestinon he feels he can lift up his feet.  Taking mestinon 3 times a day. Discussed with family if they feel that he is responding to the Mestinon he can titrate it to 60 mg 3 times a day. Discussed can also start prednisone, will start it low dose to ensure he can tolerate it. He is having some "afternoon twilighting".  Poor appetite. When taking the mestinon, the eye droop resolves.  He had a UTI recently.  Patient doesn't really feel it kicking in. Will increase Mestinon and add steroids.  Daughter contact: [phone]  Will ask for a second opinion on the myasthenia gravis diagnosis and management from Dr. Allena Katz of [facility] neurolog[facility] who is a neuromuscular disorder  expert.   Reviewed workup to date with family here today:  Discussed findings of EMG nerve conduction study as well as laboratory testing and MRI imaging with family members that are present today including his 3 children. Discussed the following:  MRi of the brain:  IMPRESSION: This is an abnormal MRI of the brain without contrast showing the following: 1. Moderate cortical atrophy but more severe ventriculomegaly. Although the large ventricles could be due to primarily to the atrophy, normal pressure hydrocephalus cannot be ruled out. 2. Moderate age related chronic microvascular ischemic changes. 3. There are no acute findings.  MRA of the head  IMPRESSION: This is an abnormal MR angiogram of the intracranial arteries showing mild focal stenosis within the P2 segment of the right posterior cerebral artery that is unlikely to be clinically significant. Additionally, there is reduced flow in the superior division of the right MCA compared to the left, though no definite stenosis is identified. No aneurysms were identified.  MRA of the neck:  IMPRESSION: This MR angiogram of the neck arteries shows no stenosis of the right common carotid origin likely to be clinically significant. Other arteries appeared normal.  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 No evidence to suggest a demyelinating motor neuropathy. No evidence of myopathy or myositis. No acute/ongoing denervation to indicate current radiculopathy. There is no decrement on repetitive nerve stimulation seen to support a diagnosis of Myasthenia Gravis however could only perform  this testing on a distal muscle and this is less sensitive than proximal muscles which patient could not tolerate.    EEG: This is a normal EEG recording in the waking state. No evidence of ictal or interictal discharges are seen.  HPI Initial Visit: Seth [name] is a 81  y.o. male here as a referral from Dr. Tenny Craw for dizziness, concussion. Past medical history of hypertension, dyslipidemia, atrial fibrillation (on AC with Eliquis), possibly aortic stenosis. He has had 9 falls. 2 of them took him to the emergency room. He has been having falls for about a year. He loses consciousness and finds himself on the ground. No weakness, not tripping over his legs. But he have bad kness which affects walking and they are sure it contributes to the early falls. He doesn't remember much at all from the falls. He remembers the second fall, he was taking the trash out and he just lost consciousness. He woke up and 2 people from the neighborhood was there. They called 911. Unclear if he was confused but patient denies any confusion, knew where he was and that he had fallen. Hard to know if he urinated on himself, but possible. No tongue biting. No episodes of altered consciousness. No memory loss, his mind is sharp per his three children providing most of the information here today. He has been experiencing dizziness since the second fall. Feels like his head is spinning. He feels like he is falling back. No worsening on movement of head. Dizziness worse with standing or sitting. Once he is stabilized in a position it goes away. But also happens when he is just sitting down. Continuous. All the time. No headache. Denies double vision, ptosis, SOB, dysphagia,dysarthria.Weakness over the last 1 year with a decline over the last 2 months since the falls.   Reviewed notes, labs and imaging from outside physicians, which showed; Patient was seen in June for an unwitnessed fall. He was found laying on his back with head on concrete and laceration. He reported walking and then found himself on the ground. He was back to baseline in the ED. His CT head showed small amount of hemorrhage layering dependently in the occipital horn of the left lateral ventricle. ED physician contacted neurosurgery and Dr.  Yetta Barre on call said pt can continue Eliquis and no need to repeat the scan unless pt's neurologic status changes. ECHO shows normal systolic function with an estimated EF of 55-60%. Results consistent with abnormal left ventricular relaxation (grade 1 dystolic dysfunction). Aortic valve moderately calcified. He was seen again in July with a syncopal episode and taken to the ED, he was taking his garbage out. He has been told that he has Aortic Stenosis.   Personally reviewed all images:  Head CT 03/30/1025: Small amount of hemorrhage layering dependently in the occipital horn of the left lateral ventricle. No evidence of more extensive subarachnoid hemorrhage. Presumably this is a small amount of traumatic hemorrhage. Atrophy and chronic small-vessel disease elsewhere. Left parietal scalp hematoma without underlying skull racture.  Cervical spine CT 7/19: No acute or traumatic finding. Chronic degenerative changes. Left-sided thyroid goiter.  CT head 7/19: Moderate atrophy with ventricles in proportion larger than sulci. Question concomitant superimposed normal pressure hydrocephalus. The previously noted hemorrhage layering in the atrium of the left lateral ventricle is no longer appreciable. No hemorrhage is currently seen on this study. There is no extra-axial fluid collection or edema. There is small vessel disease in the centra semiovale but no  acute appearing infarct. There is a left parietal and occipital scalp hematoma. No fracture evident.  CT cervical spine 7/19: Extensive osteoarthritic change, stable. No fracture or spondylolisthesis. Diffuse enlargement of the left lobe of the thyroid with calcification in mixed attenuation. There is also coarse calcification in the right lobe of the thyroid. These findings are consistent with goiter. There is debris in the upper thoracic esophagus. This finding may be indicative of chronic reflux. This patient may be at increased risk of aspiration given  this finding.  CT head 7/22: Mild cortical volume loss noted with proportional ventricular prominence. Areas of periventricular white matter hypodensity are most compatible with small vessel ischemic change. No acute hemorrhage, infarct, or mass lesion is identified. Left occipital scalp laceration noted with skin staples in place. No underlying skull fracture. Mild ethmoid mucoperiosteal thickening.  IMPRESSION: Left occipital scalp laceration without underlying acute intracranial finding.  CT head 05/30/2015: IMPRESSION: 1. No evidence of traumatic intracranial injury or fracture. 2. Soft tissue injury at the occiput, with associated laceration. 3. Moderate cortical volume loss and scattered small vessel ischemic microangiopathy. 4. Chronic lacunar infarct at the right cerebellar hemisphere.    Review of Systems: Patient complains of symptoms per HPI as well as the following symptoms: easy bruising, spinning sensation, urination roblems, incontinence, blood in urine, easy bruising, confusion, weakness, dizziness, passing out, depression, change in appetite, disinterest in activities. Pertinent negatives per HPI. All others negative.   Social History   Social History  . Marital Status: Widowed    Spouse Name: N/A  . Number of Children: 4  . Years of Education: 16   Occupational History  . Retired     Social History Main Topics  . Smoking status: Former Games developer  . Smokeless tobacco: Never Used  . Alcohol Use: No  . Drug Use: No  . Sexual Activity: Not on file   Other Topics Concern  . Not on file   Social History Narrative   Lives at [facility]:Nursing Home   Caffeine use: Soda rare       Family History  Problem Relation Age of Onset  . Prostate cancer Brother     Past Medical History  Diagnosis Date  . Hypertension   . Hyperlipidemia   . Aortic stenosis   . High cholesterol   . A-fib   . History of TIAs   . Multiple falls   . Urinary incontinence     . Dementia     short term memory loss    Past Surgical History  Procedure Laterality Date  . Hernia repair      1970s  . Back surgery  2006  . Cervical laminectomy  2002 ?  [name] Insertion of suprapubic catheter N/A 06/26/2015    Procedure: INSERTION OF SUPRAPUBIC CATHETER;  Surgeon: Malen Gauze, MD;  Location: [facility];  Service: Urology;  Laterality: N/A;  . Cystoscopy N/A 06/26/2015    Procedure: CYSTOSCOPY;  Surgeon: Malen Gauze, MD;  Location: [facility];  Service: Urology;  Laterality: N/A;    Current Outpatient Prescriptions  Medication Sig Dispense Refill  . apixaban (ELIQUIS) 5 MG TABS tablet Take 2.5 mg by mouth 2 (two) times daily.    . cetaphil (CETAPHIL) lotion Apply 1 application topically daily.    . Cholecalciferol (VITAMIN D3) 2000 UNITS TABS Take 1 tablet by mouth daily.    . metoprolol succinate (TOPROL-XL) 50 MG 24 hr tablet Take 50 mg by mouth every morning. Take with or immediately  following a meal.    . mirtazapine (REMERON) 7.5 MG tablet Take 7.5 mg by mouth at bedtime.    . Multiple Vitamin (MULTIVITAMIN WITH MINERALS) TABS tablet Take 1 tablet by mouth daily.    [name] MYRBETRIQ 25 MG TB24 tablet Take 25 mg by mouth daily.  1  . pyridostigmine (MESTINON) 60 MG tablet Take 0.5 tablets (30 mg total) by mouth 3 (three) times daily. 45 tablet 6  . senna (SENOKOT) 8.6 MG TABS tablet Take 1 tablet by mouth at bedtime.    . simvastatin (ZOCOR) 10 MG tablet Take 10 mg by mouth daily.     . traMADol (ULTRAM) 50 MG tablet Take 1 tablet (50 mg total) by mouth every 6 (six) hours as needed. 30 tablet 0  . acetaminophen (TYLENOL) 500 MG tablet Take 500 mg by mouth every 6 (six) hours as needed for mild pain. NOT on nursing home MAR    . acidophilus (RISAQUAD) CAPS capsule Take 1 capsule by mouth daily. NOT on nursing home MAR    . bisacodyl (DULCOLAX) 10 MG suppository Place 10 mg rectally as needed for mild constipation or moderate constipation. NOT on nursing home MAR    .  carbamide peroxide (DEBROX) 6.5 % otic solution Place 5 drops into both ears daily. NOT on nursing home MAR     No current facility-administered medications for this visit.    Allergies as of 07/03/2015 - Review Complete 06/26/2015  Allergen Reaction Noted  . Ceftin [cefuroxime] Diarrhea 05/10/2015    Vitals: BP 137/102 mmHg  Pulse 105  Temp(Src) 97.3 F (36.3 C) (Oral)  Ht  (1.778 m) Last Weight:  Wt Readings from Last 1 Encounters:  06/26/15 169 lb 5 oz (76.8 kg)   Last Height:   Ht Readings from Last 1 Encounters:  07/03/15  (1.778 m)    Neuro: Detailed Neurologic Exam  Speech:  Speech is normal; fluent and spontaneous with normal comprehension.  Cognition:  The patient is oriented to person, place, and time;  Cranial Nerves:  The pupils are equal, round, and reactive to light.Visual fields are full to finger confrontation. Mildly imapired upgaze otherwise extraocular movements are intact. Trigeminal sensation is intact and the muscles of mastication are normal. Left ptosis is improved today on exam possibly because he recently took Mestinon. The palate elevates in the midline. Hearing intact to voice. Voice is normal. Shoulder shrug is normal. The tongue has normal motion without fasciculations.   Coordination:  No dysmetria  Gait:  Attempted, can't bear weight  Motor Observation:  No asymmetry, no atrophy, and no involuntary movements noted. Tone:  Normal muscle tone.     Strength:                             Right                   Left  Deltoid                 3/5                         3/5 Biceps                 5/5                         5/5 Triceps  4/5                         4+/5 WE/WF               5/5                          5/5  Hip flexion            4/5                            4+/5 Knee extension    5                                5 Knee flexion         5                                  5 DF/PF                  5                                 5      Sensation: intact to LT   Reflex Exam:  DTR's:  Deep tendon reflexes in the lower extremities are absent Toes:  The toes are equivocalbilaterally.  Clonus:  Clonus is absent.     Assessment/Plan: Seth [name] is a 81 y.o. male here as a follow-up. Past medical history of hypertension, dyslipidemia, atrial fibrillation (on AC with Eliquis), possibly aortic stenosis, traumatic hemorrhage after a recent fall. He has had 9 falls. 2 of them took him to the emergency room with lacerations. His workup revealed positive Acetylcholine Blocking antibodies. He was started on Mestinon which has tolerated and family feels that he is responding, today his left ptosis is improved and he recently did take the Mestinon. He has proximal weakness. EMG did not show any evidence for myopathy or myositis or demyelinating neuropathy, did show an axonal neuropathy. There was no decrement on repetitive nerve stimulation  however could only perform this testing on a distal muscle and this is much less sensitive than proximal muscles which patient could not tolerate.  Proximal weakness and left ptosis: +Acetylchol Block Ab. He was started on Mestinon at last appointment and family feels he is responding to it, today his left ptosis is improved possibly because he took his mestinon before coming to the appointment. He is tolerating Mestinon well so will increase it to 60 mg 3 times a day, discussed cholinergic side effects. Discussed options including adding prednisone or immunosuppresant medications. They prefer to try low-dose prednisone which I agree with at this time. I would suggest starting at a lower dose to ensure he tolerates it and there are no side effects or decline as is sometimes associated with prednisone initiation. The starting daily dose of prednisone used in MG varies from from 10 to 100mg , typically 60mg  or higher.  However, a lower daily dose of prednisone may suffice, especially for patients with mild to moderate symptoms.an effective strategy might be to start prednisone at 10mg -20 mg per day for patients seen in the outpatient setting, and gradually increase the dose by  10 mg per day every 1 to 2 weeks up to approximately 60 mg, depending on the response. i would recommend starting at a lower dose and titrating slowly. Will start at a low dose and see him back in a few weeks to assess and I also would like a second option from a neuromuscular specialist, will refer to Dr. Allena Katz at [facility]. Will need to screen for thymoma.  (Addendum: Called daughter to inquire about her dad and see how he is doing, we started a very low-dose daily steroid and mestinon I am calling to see what his response has been. He did not come to his last appointment as he was in the hospital for septic shock associated with a UTI. He is scheduled to see Dr. Allena Katz in a few weeks. Can increase his daily steroid for possible myasthenia gravis but would like to see how he is doing first. Will try calling again tomorrow. Thanks)  Dizziness and vertiginous symptoms - after the second fall with resultant laceration and traumatic brain hemorrage. Likely post-concussive and improved  Falls - EEG to evaluate for epileptiform activity was normal. One normal EEG does not rule out seizure activity however falls likely due to proximal weakness   Naomie Dean, MD  [facility] [address] [place], [place] [address]  Phone [phone] Fax [phone]  A total of 60 minutes was spent face-to-face with this patient. Over half this time was spent on counseling patient on the myasthenia diagnosis and different diagnostic and therapeutic options available.

## 2015-07-03 NOTE — Patient Instructions (Signed)
Overall you are doing fairly well but I do want to suggest a few things today:   Remember to drink plenty of fluid, eat healthy meals and do not skip any meals. Try to eat protein with a every meal and eat a healthy snack such as fruit or nuts in between meals. Try to keep a regular sleep-wake schedule and try to exercise daily, particularly in the form of walking, 20-30 minutes a day, if you can.   As far as your medications are concerned, I would like to suggest: Mestinon  three time daily Prednisone  in the morning with breakfast  I would like to see you back one month after seeing Dr. Allena Katz, sooner if we need to. Please call us with any interim questions, concerns, problems, updates or refill requests.   Please also call us for any test results so we can go over those with you on the phone.  My clinical assistant and will answer any of your questions and relay your messages to me and also relay most of my messages to you.   Our phone number is [phone]. We also have an after hours call service for urgent matters and there is a physician on-call for urgent questions. For any emergencies you know to call 911 or go to the nearest emergency room

## 2015-07-04 ENCOUNTER — Encounter (HOSPITAL_COMMUNITY): Payer: Self-pay | Admitting: *Deleted

## 2015-07-04 ENCOUNTER — Encounter: Payer: Medicare Other | Admitting: Cardiovascular Disease

## 2015-07-04 DIAGNOSIS — N179 Acute kidney failure, unspecified: Secondary | ICD-10-CM | POA: Diagnosis present

## 2015-07-04 DIAGNOSIS — F05 Delirium due to known physiological condition: Secondary | ICD-10-CM | POA: Diagnosis present

## 2015-07-04 DIAGNOSIS — F0391 Unspecified dementia with behavioral disturbance: Secondary | ICD-10-CM | POA: Diagnosis not present

## 2015-07-04 DIAGNOSIS — K5641 Fecal impaction: Secondary | ICD-10-CM

## 2015-07-04 DIAGNOSIS — N183 Chronic kidney disease, stage 3 (moderate): Secondary | ICD-10-CM | POA: Diagnosis not present

## 2015-07-04 DIAGNOSIS — Z87891 Personal history of nicotine dependence: Secondary | ICD-10-CM | POA: Diagnosis not present

## 2015-07-04 DIAGNOSIS — K8020 Calculus of gallbladder without cholecystitis without obstruction: Secondary | ICD-10-CM | POA: Diagnosis not present

## 2015-07-04 DIAGNOSIS — R109 Unspecified abdominal pain: Secondary | ICD-10-CM

## 2015-07-04 DIAGNOSIS — G9340 Encephalopathy, unspecified: Secondary | ICD-10-CM | POA: Diagnosis present

## 2015-07-04 DIAGNOSIS — E860 Dehydration: Secondary | ICD-10-CM | POA: Diagnosis present

## 2015-07-04 DIAGNOSIS — N390 Urinary tract infection, site not specified: Secondary | ICD-10-CM | POA: Diagnosis present

## 2015-07-04 DIAGNOSIS — Z79899 Other long term (current) drug therapy: Secondary | ICD-10-CM | POA: Diagnosis not present

## 2015-07-04 DIAGNOSIS — Z23 Encounter for immunization: Secondary | ICD-10-CM | POA: Diagnosis not present

## 2015-07-04 DIAGNOSIS — I4891 Unspecified atrial fibrillation: Secondary | ICD-10-CM | POA: Diagnosis present

## 2015-07-04 DIAGNOSIS — R0902 Hypoxemia: Secondary | ICD-10-CM | POA: Insufficient documentation

## 2015-07-04 DIAGNOSIS — Z9181 History of falling: Secondary | ICD-10-CM | POA: Diagnosis not present

## 2015-07-04 DIAGNOSIS — I350 Nonrheumatic aortic (valve) stenosis: Secondary | ICD-10-CM | POA: Diagnosis present

## 2015-07-04 DIAGNOSIS — T443X5A Adverse effect of other parasympatholytics [anticholinergics and antimuscarinics] and spasmolytics, initial encounter: Secondary | ICD-10-CM | POA: Diagnosis present

## 2015-07-04 DIAGNOSIS — F0390 Unspecified dementia without behavioral disturbance: Secondary | ICD-10-CM | POA: Diagnosis present

## 2015-07-04 DIAGNOSIS — Z881 Allergy status to other antibiotic agents status: Secondary | ICD-10-CM | POA: Diagnosis not present

## 2015-07-04 DIAGNOSIS — Z8673 Personal history of transient ischemic attack (TIA), and cerebral infarction without residual deficits: Secondary | ICD-10-CM | POA: Diagnosis not present

## 2015-07-04 DIAGNOSIS — R4182 Altered mental status, unspecified: Secondary | ICD-10-CM | POA: Diagnosis not present

## 2015-07-04 DIAGNOSIS — Z7901 Long term (current) use of anticoagulants: Secondary | ICD-10-CM | POA: Diagnosis not present

## 2015-07-04 DIAGNOSIS — R1030 Lower abdominal pain, unspecified: Secondary | ICD-10-CM | POA: Diagnosis not present

## 2015-07-04 DIAGNOSIS — Z66 Do not resuscitate: Secondary | ICD-10-CM | POA: Diagnosis present

## 2015-07-04 DIAGNOSIS — L8990 Pressure ulcer of unspecified site, unspecified stage: Secondary | ICD-10-CM | POA: Insufficient documentation

## 2015-07-04 DIAGNOSIS — Z7952 Long term (current) use of systemic steroids: Secondary | ICD-10-CM | POA: Diagnosis not present

## 2015-07-04 DIAGNOSIS — E872 Acidosis: Secondary | ICD-10-CM | POA: Diagnosis present

## 2015-07-04 DIAGNOSIS — I480 Paroxysmal atrial fibrillation: Secondary | ICD-10-CM

## 2015-07-04 DIAGNOSIS — E780 Pure hypercholesterolemia: Secondary | ICD-10-CM | POA: Diagnosis present

## 2015-07-04 DIAGNOSIS — I129 Hypertensive chronic kidney disease with stage 1 through stage 4 chronic kidney disease, or unspecified chronic kidney disease: Secondary | ICD-10-CM | POA: Diagnosis present

## 2015-07-04 DIAGNOSIS — M6281 Muscle weakness (generalized): Secondary | ICD-10-CM | POA: Diagnosis not present

## 2015-07-04 DIAGNOSIS — R2681 Unsteadiness on feet: Secondary | ICD-10-CM | POA: Diagnosis not present

## 2015-07-04 DIAGNOSIS — R488 Other symbolic dysfunctions: Secondary | ICD-10-CM | POA: Diagnosis not present

## 2015-07-04 DIAGNOSIS — G7000 Myasthenia gravis without (acute) exacerbation: Secondary | ICD-10-CM | POA: Diagnosis present

## 2015-07-04 LAB — CBC
HCT: 41.1 % (ref 39.0–52.0)
HEMOGLOBIN: 13.3 g/dL (ref 13.0–17.0)
MCH: 27.8 pg (ref 26.0–34.0)
MCHC: 32.4 g/dL (ref 30.0–36.0)
MCV: 86 fL (ref 78.0–100.0)
PLATELETS: 427 10*3/uL — AB (ref 150–400)
RBC: 4.78 MIL/uL (ref 4.22–5.81)
RDW: 14.8 % (ref 11.5–15.5)
WBC: 12.9 10*3/uL — AB (ref 4.0–10.5)

## 2015-07-04 LAB — BASIC METABOLIC PANEL
ANION GAP: 11 (ref 5–15)
BUN: 15 mg/dL (ref 6–20)
CALCIUM: 9.1 mg/dL (ref 8.9–10.3)
CO2: 22 mmol/L (ref 22–32)
CREATININE: 1.09 mg/dL (ref 0.61–1.24)
Chloride: 105 mmol/L (ref 101–111)
GFR, EST NON AFRICAN AMERICAN: 59 mL/min — AB (ref >60)
Glucose, Bld: 122 mg/dL — ABNORMAL HIGH (ref 65–99)
Potassium: 3.6 mmol/L (ref 3.5–5.1)
SODIUM: 138 mmol/L (ref 135–145)

## 2015-07-04 LAB — I-STAT CG4 LACTIC ACID, ED: LACTIC ACID, VENOUS: 1.68 mmol/L (ref 0.5–2.0)

## 2015-07-04 LAB — CG4 I-STAT (LACTIC ACID): LACTIC ACID, VENOUS: 2.07 mmol/L — AB (ref 0.5–2.0)

## 2015-07-04 LAB — MRSA PCR SCREENING: MRSA by PCR: NEGATIVE

## 2015-07-04 MED ORDER — ENSURE ENLIVE PO LIQD
237.0000 mL | Freq: Two times a day (BID) | ORAL | Status: DC
  Administered 2015-07-04 – 2015-07-07 (×6): 237 mL via ORAL

## 2015-07-04 MED ORDER — APIXABAN 2.5 MG PO TABS
2.5000 mg | ORAL_TABLET | Freq: Two times a day (BID) | ORAL | Status: DC
  Administered 2015-07-04 – 2015-07-07 (×8): 2.5 mg via ORAL
  Filled 2015-07-04 (×8): qty 1

## 2015-07-04 MED ORDER — PYRIDOSTIGMINE BROMIDE 60 MG PO TABS
30.0000 mg | ORAL_TABLET | Freq: Three times a day (TID) | ORAL | Status: DC
  Administered 2015-07-04 – 2015-07-07 (×10): 30 mg via ORAL
  Filled 2015-07-04 (×12): qty 0.5

## 2015-07-04 MED ORDER — METOPROLOL SUCCINATE ER 50 MG PO TB24
50.0000 mg | ORAL_TABLET | Freq: Every day | ORAL | Status: DC
  Administered 2015-07-04 – 2015-07-07 (×4): 50 mg via ORAL
  Filled 2015-07-04 (×4): qty 1

## 2015-07-04 MED ORDER — SODIUM CHLORIDE 0.9 % IV SOLN
INTRAVENOUS | Status: DC
  Administered 2015-07-04 – 2015-07-05 (×3): via INTRAVENOUS
  Administered 2015-07-05: 1000 mL via INTRAVENOUS
  Administered 2015-07-06: 04:00:00 via INTRAVENOUS

## 2015-07-04 MED ORDER — ACETAMINOPHEN 325 MG PO TABS: 650.0000 mg | ORAL_TABLET | Freq: Four times a day (QID) | ORAL | Status: DC | PRN

## 2015-07-04 MED ORDER — MILK AND MOLASSES ENEMA
1.0000 | Freq: Once | RECTAL | Status: AC
  Administered 2015-07-04: 250 mL via RECTAL
  Filled 2015-07-04: qty 250

## 2015-07-04 MED ORDER — SODIUM CHLORIDE 0.9 % IV SOLN: INTRAVENOUS | Status: DC

## 2015-07-04 MED ORDER — OXYCODONE HCL 5 MG PO TABS: 5.0000 mg | ORAL_TABLET | ORAL | Status: DC | PRN

## 2015-07-04 MED ORDER — ONDANSETRON HCL 4 MG/2ML IJ SOLN: 4.0000 mg | Freq: Four times a day (QID) | INTRAMUSCULAR | Status: DC | PRN

## 2015-07-04 MED ORDER — MIRTAZAPINE 15 MG PO TABS
7.5000 mg | ORAL_TABLET | Freq: Every day | ORAL | Status: DC
  Administered 2015-07-04 – 2015-07-06 (×3): 7.5 mg via ORAL
  Filled 2015-07-04 (×3): qty 1

## 2015-07-04 MED ORDER — VITAMIN D3 25 MCG (1000 UNIT) PO TABS
2000.0000 [IU] | ORAL_TABLET | Freq: Every day | ORAL | Status: DC
  Administered 2015-07-04 – 2015-07-07 (×4): 2000 [IU] via ORAL
  Filled 2015-07-04 (×8): qty 2

## 2015-07-04 MED ORDER — HYDROMORPHONE HCL 1 MG/ML IJ SOLN: 0.5000 mg | INTRAMUSCULAR | Status: DC | PRN

## 2015-07-04 MED ORDER — SIMVASTATIN 10 MG PO TABS
10.0000 mg | ORAL_TABLET | Freq: Every day | ORAL | Status: DC
  Administered 2015-07-04 – 2015-07-07 (×4): 10 mg via ORAL
  Filled 2015-07-04 (×4): qty 1

## 2015-07-04 MED ORDER — ONDANSETRON HCL 4 MG PO TABS: 4.0000 mg | ORAL_TABLET | Freq: Four times a day (QID) | ORAL | Status: DC | PRN

## 2015-07-04 MED ORDER — ACETAMINOPHEN 650 MG RE SUPP: 650.0000 mg | Freq: Four times a day (QID) | RECTAL | Status: DC | PRN

## 2015-07-04 MED ORDER — MIRABEGRON ER 25 MG PO TB24
25.0000 mg | ORAL_TABLET | Freq: Every day | ORAL | Status: DC
  Administered 2015-07-04 – 2015-07-07 (×4): 25 mg via ORAL
  Filled 2015-07-04 (×4): qty 1

## 2015-07-04 MED ORDER — CETYLPYRIDINIUM CHLORIDE 0.05 % MT LIQD
7.0000 mL | Freq: Two times a day (BID) | OROMUCOSAL | Status: DC
  Administered 2015-07-04 – 2015-07-06 (×6): 7 mL via OROMUCOSAL

## 2015-07-04 MED ORDER — SODIUM CHLORIDE 0.9 % IJ SOLN
3.0000 mL | Freq: Two times a day (BID) | INTRAMUSCULAR | Status: DC
  Administered 2015-07-04: 10 mL via INTRAVENOUS
  Administered 2015-07-06: 3 mL via INTRAVENOUS

## 2015-07-04 MED ORDER — ONDANSETRON HCL 4 MG/2ML IJ SOLN: 4.0000 mg | Freq: Three times a day (TID) | INTRAMUSCULAR | Status: DC | PRN

## 2015-07-04 MED ORDER — LEVOFLOXACIN IN D5W 500 MG/100ML IV SOLN
500.0000 mg | INTRAVENOUS | Status: DC
  Administered 2015-07-04 – 2015-07-06 (×3): 500 mg via INTRAVENOUS
  Filled 2015-07-04 (×4): qty 100

## 2015-07-04 MED ORDER — PNEUMOCOCCAL VAC POLYVALENT 25 MCG/0.5ML IJ INJ
0.5000 mL | INJECTION | INTRAMUSCULAR | Status: AC
  Administered 2015-07-05: 0.5 mL via INTRAMUSCULAR
  Filled 2015-07-04: qty 0.5

## 2015-07-04 MED ORDER — ALUM & MAG HYDROXIDE-SIMETH 200-200-20 MG/5ML PO SUSP: 30.0000 mL | Freq: Four times a day (QID) | ORAL | Status: DC | PRN

## 2015-07-04 MED ORDER — INFLUENZA VAC SPLIT QUAD 0.5 ML IM SUSY
0.5000 mL | PREFILLED_SYRINGE | INTRAMUSCULAR | Status: AC
  Administered 2015-07-05: 0.5 mL via INTRAMUSCULAR
  Filled 2015-07-04: qty 0.5

## 2015-07-04 MED ORDER — ADULT MULTIVITAMIN W/MINERALS CH
1.0000 | ORAL_TABLET | Freq: Every day | ORAL | Status: DC
  Administered 2015-07-04 – 2015-07-07 (×4): 1 via ORAL
  Filled 2015-07-04 (×4): qty 1

## 2015-07-04 NOTE — ED Notes (Signed)
Milk and Mollases enema administered , large amount of hard stools expelled , pt. stated he felt better/ abdominal pressure relieved. Dr. Lovell Sheehan explained plan of care and admission to pt. and family.

## 2015-07-04 NOTE — Progress Notes (Signed)
[facility]                                                                              Patient Demographics  Seth [name], is a 81 y.o. male, DOB - Oct 07, 1925, ZOX:[id number]  Admit date - 07/03/2015   Admitting Physician Ron Parker, MD  Outpatient Primary MD for the patient is  Duane Lope, MD  LOS - 0   Chief Complaint  Patient presents with  . Altered Mental Status       Brief HPI   Briefly ,Seth [name] is a 81 y.o. male resident of the [facility] sent to the ED for ABD Pain and confusion x 2 days and poor intake of foods and liquids. His family felt he was not acting right. One Week ago, prior to admission, he had his supra-pubic catheter replaced, and the foley catheter he had in placed was discontinued 1 day ago. His family was concerned that he may have a UTI. He has had increased confusion for the past 3 weeks. EMS reported findings of hypoxia with o2 sats in the 80's on RA. He was placed on 4 liters of NCO2 on route. In ED, CT of the head showed no acute findings. Ct scan of the ABD /Pelvis reveal a Fecal Impaction so he was disimpacted in the ED and an enema was ordered. A UA was performed and sent for C+S.      Assessment & Plan    Principal Problem:   Acute encephalopathy with underlying dementia, UTI, fecal impaction, medication effect - Continue gentle hydration, follow urine culture and sensitivities, placed on IV levofloxacin  Active Problems:   Mild acute on chronic CKD (chronic kidney disease), stage III - Creatinine 1.3 at the time of admission, improved 1.0 with IV fluids  Lactic acidosis with dehydration - Continue gentle hydration  Fecal impaction due to pain medications, anticholinergic medications - Patient was disimpacted in ED, enema was ordered, continue bowel regimen  Essential hypertension Continue metoprolol, currently stable  Myasthenia gravis - Continue  pyridostigmine   Dyslipidemia Continue simvastatin  Atrial fibrillation Currently rate controlled, continue metoprolol, eliquis   Code Status: DNR  Family Communication: No family member at the bedside   Disposition Plan: Likely DC in24 hours back to facility if improving  Time Spent in minutes 25 minutes  Procedures  None   Consults   None   DVT Prophylaxis eliquis  Medications  Scheduled Meds: . antiseptic oral rinse  7 mL Mouth Rinse BID  . apixaban  2.5 mg Oral BID  . cholecalciferol  2,000 Units Oral Daily  . feeding supplement (ENSURE ENLIVE)  237 mL Oral BID BM  . [START ON 07/05/2015] Influenza vac split quadrivalent PF  0.5 mL Intramuscular Tomorrow-1000  . levofloxacin (LEVAQUIN) IV  500 mg Intravenous Q24H  . metoprolol succinate  50 mg Oral Daily  . mirabegron ER  25 mg Oral Daily  . mirtazapine  7.5 mg Oral QHS  . multivitamin with minerals  1 tablet Oral Daily  . [START ON 07/05/2015] pneumococcal 23 valent vaccine  0.5 mL Intramuscular Tomorrow-1000  . pyridostigmine  30  mg Oral TID  . simvastatin  10 mg Oral Daily  . sodium chloride  3 mL Intravenous Q12H   Continuous Infusions: . sodium chloride 75 mL/hr at 07/04/15 1121   PRN Meds:.acetaminophen **OR** acetaminophen, alum & mag hydroxide-simeth, HYDROmorphone (DILAUDID) injection, ondansetron **OR** ondansetron (ZOFRAN) IV, oxyCODONE   Antibiotics   Anti-infectives    Start     Dose/Rate Route Frequency Ordered Stop   07/04/15 1500  levofloxacin (LEVAQUIN) IVPB 500 mg     500 mg 100 mL/hr over 60 Minutes Intravenous Every 24 hours 07/04/15 1447          Subjective:   Korde Jeppsen was seen and examined today. Patient alert and oriented 2, feels a lot better today, denied any abdominal pain, chest pain. No nausea or vomiting. Afebrile.  Objective:   Blood pressure 138/67, pulse 93, temperature 98 F (36.7 C), temperature source Oral, resp. rate 20, height 5\' 10"  (1.778 m), weight  74.5 kg (164 lb 3.9 oz), SpO2 95 %.  Wt Readings from Last 3 Encounters:  07/04/15 74.5 kg (164 lb 3.9 oz)  06/26/15 76.8 kg (169 lb 5 oz)  05/30/15 90.719 kg (200 lb)     Intake/Output Summary (Last 24 hours) at 07/04/15 1455 Last data filed at 07/04/15 1449  Gross per 24 hour  Intake 998.75 ml  Output     52 ml  Net 946.75 ml    Exam  General: Alert and oriented x 2, self and place. Pleasant NAD  HEENT:  PERRLA, EOMI, Anicteric Sclera, mucous membranes moist.   Neck: Supple, no JVD, no masses  CVS: S1 S2 auscultated, no rubs, murmurs or gallops. Regular rate and rhythm.  Respiratory: Clear to auscultation bilaterally, no wheezing, rales or rhonchi  Abdomen: Soft, nontender, nondistended, + bowel sounds  Ext: no cyanosis clubbing or edema  Neuro: AAOx2, Cr N's II- XII. Strength 5/5 upper and lower extremities bilaterally  Skin: No rashes  Psych: Normal affect and demeanor, alert and oriented x2    Data Review   Micro Results Recent Results (from the past 240 hour(s))  Urine culture     Status: None (Preliminary result)   Collection Time: 07/03/15 10:46 PM  Result Value Ref Range Status   Specimen Description URINE, RANDOM  Final   Special Requests NONE  Final   Culture NO GROWTH < 12 HOURS  Final   Report Status PENDING  Incomplete  MRSA PCR Screening     Status: None   Collection Time: 07/04/15  5:30 AM  Result Value Ref Range Status   MRSA by PCR NEGATIVE NEGATIVE Final    Comment:        The GeneXpert MRSA Assay (FDA approved for NASAL specimens only), is one component of a comprehensive MRSA colonization surveillance program. It is not intended to diagnose MRSA infection nor to guide or monitor treatment for MRSA infections.     Radiology Reports Ct Abdomen Pelvis Wo Contrast  07/04/2015   CLINICAL DATA:  81 year old male with lower abdominal pain and no bowel movement  EXAM: CT ABDOMEN AND PELVIS WITHOUT CONTRAST  TECHNIQUE: Multidetector CT  imaging of the abdomen and pelvis was performed following the standard protocol without IV contrast.  COMPARISON:  None.  FINDINGS: Evaluation of this exam is limited in the absence of intravenous contrast. Evaluation is also limited due to respiratory motion artifact. Small calcified right middle lobe granuloma. The visualized lung bases are clear. There is coronary vascular calcification.  No intra-abdominal free air  or free fluid.  Small scattered hepatic hypodense lesions are not characterized but may represent a cyst or hemangioma. MRI may provide better characterization if clinically indicated. There are multiple layering stones within the gallbladder. No pericholecystic fluid or inflammatory changes identified. The pancreas, spleen, adrenal glands, kidneys caps appear unremarkable. There is a 3 mm calculus along the posterior wall of the urinary bladder at the right UVJ. The urinary bladder is decompressed around a suprapubic catheter. There is apparent thickening of the bladder wall with mild perivesical haziness. Correlation with urinalysis recommended to exclude cystitis. The prostate gland is enlarged with mass effect and elevation of the base of the bladder.  Moderate amount of dense stool noted throughout the colon and rectosigmoid. There is a large dense stool ball within the rectum compatible with fecal impaction. There is colonic diverticulosis without active inflammation. No evidence of bowel obstruction. Normal appendix.  There is aortoiliac atherosclerotic disease. 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 Evaluation of the vasculature is limited in the absence of intravenous contrast. No portal venous gas identified. There is no lymphadenopathy.  Small fat containing umbilical hernia. Osteopenia with severe degenerative changes of the spine. There is loss of bone trabeculation  involving the sacrum with areas of sclerosis this may be related to osteopenia or possible postradiation changes. A lytic neoplastic process is not excluded clinical correlation is recommended. MRI is recommended for further evaluation. There is grade 1 L4-5 anterolisthesis. No acute fracture.  IMPRESSION: Constipation with fecal impaction within the rectum. No evidence of bowel obstruction. Diverticulosis.  Cholelithiasis.  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  Lidocaine appearance of the sacrum possibly related to osteopenia for radiation. Correlation with clinical exam and MRI recommended.   Electronically Signed   By: Elgie Collard M.D.   On: 07/04/2015 00:21   Dg Chest 2 View  07/03/2015   CLINICAL DATA:  Hypoxia.  EXAM: CHEST  2 VIEW  COMPARISON:  01/19/2015  FINDINGS: Again seen elevation of left hemidiaphragm. Heart at the upper limits of normal in size, again seen tortuosity of the thoracic aorta. Pulmonary vasculature is normal. There is no consolidation, pleural effusion or pneumothorax. The bones are under mineralized without acute osseous abnormality.  IMPRESSION: No acute pulmonary process.   Electronically Signed   By: Rubye Oaks M.D.   On: 07/03/2015 21:54   Ct Head Wo Contrast  07/04/2015   CLINICAL DATA:  Altered mental status.  EXAM: CT HEAD WITHOUT CONTRAST  TECHNIQUE: Contiguous axial images were obtained from the base of the skull through the vertex without intravenous contrast.  COMPARISON:  CT head 05/30/2015, brain MRI 06/12/2015  FINDINGS: No intracranial hemorrhage, mass effect, or midline shift. No hydrocephalus. The basilar cisterns are patent. No evidence of territorial infarct. No intracranial fluid collection. Unchanged cerebral and cerebellar atrophy. 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 Unchanged chronic small vessel ischemic change. Calvarium is intact. Included paranasal sinuses and mastoid air cells are well aerated.  IMPRESSION: Stable atrophy and chronic  small vessel ischemic change throughout acute intracranial abnormality.   Electronically Signed   By: Rubye Oaks M.D.   On: 07/04/2015 00:11   Mr Shirlee Latch [facility] Contrast  06/13/2015     [facility] [address], [address] [place], [place] [address] [phone]  NEUROIMAGING REPORT   STUDY DATE: 06/12/2015 PATIENT NAME: ALECSANDER HATTABAUGH DOB: December 31, 1924 MRN:  [id number]  EXAM: MR angiogram of the intracranial arteries  ORDERING CLINICIAN: Naomie Dean M.D. CLINICAL HISTORY: 81 year old man with ataxia, vertigo and syncope. COMPARISON FILMS: None  TECHNIQUE: MR angiogram of the head was obtained utilizing 3D time of  flight sequences from below the vertebrobasilar junction up to the  intracranial vasculature without contrast.  Computerized reconstructions  were obtained. CONTRAST: None IMAGING SITE: [facility],  [address]  FINDINGS: The imaged extracranial and intracranial portions of the  internal carotid arteries appear normal. The anterior cerebral arteries  appear normal. There is less flow in the right superior division of the  MCA compared to the left. In the posterior circulation, the left vertebral  artery is dominant.. No stenosis is noted within the vertebral arteries  and the basilar arteries. The right posterior cerebral artery has mild  focal stenosis within the P2 segment. The left posterior cerebral artery  appears normal. No aneurysms were identified.    06/13/2015    This is an abnormal MR angiogram of the intracranial arteries  showing mild focal stenosis within the P2 segment of the right posterior  cerebral artery that is unlikely to be clinically significant.   Additionally, there is reduced flow in the superior division of the right  MCA compared to the left, though no definite stenosis is identified.  No  aneurysms were identified.    INTERPRETING PHYSICIAN:  Richard A. Epimenio Foot, MD, PhD Certified in  Neuroimaging by American Society of Neuroimaging    Mr  Angiogram Neck [facility] Contrast  06/13/2015     [facility] [address], [address] [place], [place] [address] [phone]  NEUROIMAGING REPORT   STUDY DATE: 06/12/2015 PATIENT NAME: KOLESON REIFSTECK DOB: 1925-08-12 MRN: [id number]  EXAM: MR angiogram of the neck arteries  ORDERING CLINICIAN: Naomie Dean M.D. CLINICAL HISTORY: 81 year old man with syncope, ataxia and vertigo. COMPARISON FILMS: None  TECHNIQUE: MR angiogram of the neck was obtained utilizing 3D  time-of-flight sequences from the aortic arch up to the intracranial  vasculature postbolus contrast infusion.  After the infusion of contrast,  2D time-of-flight noncontrast views and computerized reconstructions were  obtained. CONTRAST: 18 mL MultiHance IMAGING SITE: [facility],  [address]  FINDINGS: This study is of adequate technical quality.  There is  anterograde flow in the bilateral vertebral and carotid arteries on 2D-TOF  views.  The flow signal of the left subclavian artery has no stenosis.   The left common, internal and external carotid arteries have no stenosis.   The left vertebral artery has no stenosis from its origin up to the  vertebrobasilar junction.  On the right, the brachiocephalic trunk and  subclavian arteries have no stenosis. The right common carotid artery has  mild stenosis at the origin that is unlikely to be clinically significant.   The internal and external carotid arteries have no stenosis. The right  vertebral artery has no stenosis from its origin to the vertebrobasilar  junction. Limited views of the intracranial vasculature are unremarkable.   06/13/2015    This MR angiogram of the neck arteries shows no stenosis of  the right common carotid origin likely to be clinically significant. Other  arteries appeared normal.    INTERPRETING PHYSICIAN:  Richard A. Epimenio Foot, MD, PhD Certified in  Neuroimaging by American Society of Neuroimaging    Mr Brain Wo Contrast  06/13/2015     [facility] [address] [place], [place] [address] [phone]  NEUROIMAGING REPORT   STUDY DATE: 06/12/2015 PATIENT NAME: RYKEN PASCHAL DOB: 07/18/1925 MRN: [id number]  EXAM: MRI Brain without contrast  ORDERING CLINICIAN: Naomie Dean M.D. CLINICAL HISTORY: 81 year old man with syncope, ataxia and vertigo COMPARISON FILMS: CT scan 05/30/2015  TECHNIQUE: MRI of the brain without contrast was obtained utilizing 5 mm  axial slices with T1, T2, T2 flair, SWI and diffusion weighted views.  T1  sagittal and T2 coronal views were obtained. CONTRAST: none IMAGING SITE: [facility], [address] [place], [place]  FINDINGS: On sagittal images, the spinal cord is imaged caudally to C2C3  and is normal in caliber.   The contents of the posterior fossa are of  normal size and position.   The pituitary gland and optic chiasm appear  normal.   The ventricles are moderately enlarged. Although much of this  enlargement appears to be due to moderate cortical atrophy, ventricle size  may be a little out of proportion to the extent of atrophy.   The atrophy  is most pronounced in the mesial temporal lobes.  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   There are no other abnormal extra-axial  collections of fluid.    Mild cerebellar atrophy is noted. There are T2/FLAIR hyperintense foci  noted within the right cerebellar hemisphere. The brainstem appears  normal. The deep gray matter appears normal. In the hemispheres, there are  periventricular and deep white matter T2/FLAIR hyperintense foci.  None of  these foci appears to be acute. The orbits appear normal.   The  VIIth/VIIIth nerve complex appears normal.  The mastoid air cells appear  normal.  The paranasal sinuses appear normal.  Flow voids are identified  within the major intracerebral arteries.     Diffusion weighted images are normal.  Susceptibility weighted images are  normal.    Compared to CT scan dated 05/30/2015, there does  not appear to be any  change.   06/13/2015    This is an abnormal MRI of the brain without contrast showing  the following: 1.   Moderate cortical atrophy but more severe ventriculomegaly.    Although the large ventricles could be due to primarily to the atrophy,  normal pressure hydrocephalus cannot be ruled out. 2.   Moderate age related chronic microvascular ischemic changes. 3.   There are no acute findings.    INTERPRETING PHYSICIAN:  Richard A. Epimenio Foot, MD, PhD Certified in  Neuroimaging by American Society of Neuroimaging       CBC  Recent Labs Lab 07/03/15 2116 07/04/15 0615  WBC 12.2* 12.9*  HGB 14.9 13.3  HCT 44.7 41.1  PLT 492* 427*  MCV 84.8 86.0  MCH 28.3 27.8  MCHC 33.3 32.4  RDW 14.5 14.8  LYMPHSABS 1.6  --   MONOABS 1.0  --   EOSABS 0.1  --   BASOSABS 0.0  --     Chemistries   Recent Labs Lab 07/03/15 2116 07/04/15 0615  NA 140 138  K 3.8 3.6  CL 102 105  CO2 25 22  GLUCOSE 115* 122*  BUN 15 15  CREATININE 1.33* 1.09  CALCIUM 10.0 9.1  AST 30  --   ALT 22  --   ALKPHOS 106  --   BILITOT 0.9  --    ------------------------------------------------------------------------------------------------------------------ estimated creatinine clearance is 48.4 mL/min (by C-G formula based on Cr of 1.09). ------------------------------------------------------------------------------------------------------------------ No results for input(s): HGBA1C in the last 72 hours. ------------------------------------------------------------------------------------------------------------------ No results for input(s): CHOL, HDL, LDLCALC, TRIG, CHOLHDL,  LDLDIRECT in the last 72 hours. ------------------------------------------------------------------------------------------------------------------ No results for input(s): TSH, T4TOTAL, T3FREE, THYROIDAB in the last 72 hours.  Invalid input(s):  FREET3 ------------------------------------------------------------------------------------------------------------------ No results for input(s): VITAMINB12, FOLATE, FERRITIN, TIBC, IRON, RETICCTPCT in the last 72 hours.  Coagulation profile No results for input(s): INR, PROTIME in the last 168 hours.  No results for input(s): DDIMER in the last 72 hours.  Cardiac Enzymes No results for input(s): CKMB, TROPONINI, MYOGLOBIN in the last 168 hours.  Invalid input(s): CK ------------------------------------------------------------------------------------------------------------------ Invalid input(s): POCBNP  No results for input(s): GLUCAP in the last 72 hours.   Amor Packard M.D. [facility] 07/04/2015, 2:55 PM  Pager: [phone] Between 7am to 7pm - call Pager - [phone]  After 7pm go to [email or web address] - password [email or web address]  Call night coverage person covering after 7pm

## 2015-07-04 NOTE — H&P (Signed)
[facility] Admission History and Physical       Seth [name] WUJ:[id number] DOB: July 22, 1925 DOA: 07/03/2015  Referring physician: EDP PCP:  Duane Lope, MD  Specialists:   Chief Complaint: ABD Pain Loss of Appetite and Confusion  HPI: Seth [name] is a 81 y.o. male resident of the [facility] sent to the ED for ABD Pain and confusion x 2 days and poor intake of foods and liquids.  His family felt he was not acting right.   One Week ago he had his supra-pubic catheter replaced, and the foley catheter he had in placed was discontinued 1 day ago.  His family was concerned that he may have a UTI.   He has had increased confusion for the past 3 weeks.  EMS reported findings of hypoxia with o2 sats in the 80's on RA.   He was placed on 4 liters of NCO2 on route.    In the Ed a Cr scan of the head was performed and was negative for acute findings, and a Ct scan of the ABD /Pelvis reveal a Fecal Impaction so he was disimpacted in the ED and an enema was ordered.   A UA was performed and sent for C+S.     Patient has a history of Recently diagnosed Myastenia Gravis, AS, Atrial fibrillaltion HTN and Hyperlipidemia.      Review of Systems:  Constitutional: No Weight Loss, No Weight Gain, Night Sweats, Fevers, Chills, Dizziness, Light Headedness, Fatigue, or Generalized Weakness HEENT: No Headaches, Difficulty Swallowing,Tooth/Dental Problems,Sore Throat,  No Sneezing, Rhinitis, Ear Ache, Nasal Congestion, or Post Nasal Drip,  Cardio-vascular:  No Chest pain, Orthopnea, PND, Edema in Lower Extremities, Anasarca, Dizziness, Palpitations  Resp: No Dyspnea, No DOE, No Productive Cough, No Non-Productive Cough, No Hemoptysis, No Wheezing.    GI: No Heartburn, Indigestion, +Abdominal Pain, Nausea, Vomiting, Diarrhea, Constipation, Hematemesis, Hematochezia, Melena, Change in Bowel Habits, +Loss of Appetite  GU: No Dysuria, No Change in Color of Urine, No Urgency or Urinary Frequency, No  Flank pain.  Musculoskeletal: No Joint Pain or Swelling, No Decreased Range of Motion, No Back Pain.  Neurologic: No Syncope, No Seizures, Muscle Weakness, Paresthesia, Vision Disturbance or Loss, No Diplopia, No Vertigo, No Difficulty Walking,  Skin: No Rash or Lesions. Psych: No Change in Mood or Affect, No Depression or Anxiety, No Memory loss, +Confusion, or Hallucinations   Past Medical History  Diagnosis Date  . Hypertension   . Hyperlipidemia   . Aortic stenosis   . High cholesterol   . A-fib   . History of TIAs   . Multiple falls   . Urinary incontinence   . Dementia     short term memory loss     Past Surgical History  Procedure Laterality Date  . Hernia repair      1970s  . Back surgery  2006  . Cervical laminectomy  2002 ?  [name] Insertion of suprapubic catheter N/A 06/26/2015    Procedure: INSERTION OF SUPRAPUBIC CATHETER;  Surgeon: Malen Gauze, MD;  Location: [facility];  Service: Urology;  Laterality: N/A;  . Cystoscopy N/A 06/26/2015    Procedure: CYSTOSCOPY;  Surgeon: Malen Gauze, MD;  Location: [facility];  Service: Urology;  Laterality: N/A;      Prior to Admission medications   Medication Sig Start Date End Date Taking? Authorizing Provider  acetaminophen (TYLENOL) 500 MG tablet Take 500 mg by mouth every 6 (six) hours as needed for mild pain. NOT on  nursing home [facility]   Yes Historical Provider, MD  apixaban (ELIQUIS) 2.5 MG TABS tablet Take 2.5 mg by mouth 2 (two) times daily.   Yes Historical Provider, MD  Cholecalciferol (VITAMIN D3) 2000 UNITS TABS Take 2,000 Units by mouth daily.    Yes Historical Provider, MD  Emollient (CETAPHIL) cream Apply 1 application topically as needed (for feet).   Yes Historical Provider, MD  metoprolol succinate (TOPROL-XL) 50 MG 24 hr tablet Take 50 mg by mouth every morning. Take with or immediately following a meal.   Yes Historical Provider, MD  mirtazapine (REMERON) 7.5 MG tablet Take 7.5 mg by mouth at bedtime.   Yes  Historical Provider, MD  Multiple Vitamin (MULTIVITAMIN WITH MINERALS) TABS tablet Take 1 tablet by mouth daily.   Yes Historical Provider, MD  MYRBETRIQ 25 MG TB24 tablet Take 25 mg by mouth daily. 06/29/15  Yes Historical Provider, MD  pyridostigmine (MESTINON) 60 MG tablet Take 1 tablet (60 mg total) by mouth 3 (three) times daily. Patient taking differently: Take 30 mg by mouth 3 (three) times daily.  07/03/15  Yes Anson Fret, MD  senna (SENOKOT) 8.6 MG TABS tablet Take 1 tablet by mouth at bedtime.   Yes Historical Provider, MD  simvastatin (ZOCOR) 10 MG tablet Take 10 mg by mouth daily.    Yes Historical Provider, MD  traMADol (ULTRAM) 50 MG tablet Take 1 tablet (50 mg total) by mouth every 6 (six) hours as needed. 06/26/15  Yes Malen Gauze, MD  predniSONE (DELTASONE) 10 MG tablet Take 1 tablet (10 mg total) by mouth daily with breakfast. 07/03/15   Anson Fret, MD     Allergies  Allergen Reactions  . Ceftin [Cefuroxime] Diarrhea    Severe diarrhea    Social History:  reports that he has quit smoking. He has never used smokeless tobacco. He reports that he does not drink alcohol or use illicit drugs.    Family History  Problem Relation Age of Onset  . Prostate cancer Brother        Physical Exam:  GEN:  Pleasant  81 y.o. male examined and in no acute distress; cooperative with exam Filed Vitals:   07/03/15 2330 07/04/15 0023 07/04/15 0253 07/04/15 0634  BP: 138/83 136/75 151/75 160/74  Pulse: 91 91 79 81  Temp:   97.8 F (36.6 C) 97.5 F (36.4 C)  TempSrc:    Oral  Resp: 24 16 18 20   Height:   5\' 10"  (1.778 m)   Weight:   74.5 kg (164 lb 3.9 oz)   SpO2: 86% 94% 99% 98%   Blood pressure 160/74, pulse 81, temperature 97.5 F (36.4 C), temperature source Oral, resp. rate 20, height 5\' 10"  (1.778 m), weight 74.5 kg (164 lb 3.9 oz), SpO2 98 %. PSYCH: He is alert and oriented x2; does not appear anxious does not appear depressed; affect is normal HEENT:  Normocephalic and Atraumatic, Mucous membranes pink; PERRLA; EOM intact; Fundi:  Benign;  No scleral icterus, Nares: Patent, Oropharynx: Clear,   Neck:  FROM, No Cervical Lymphadenopathy nor Thyromegaly or Carotid Bruit; No JVD; Breasts:: Not examined CHEST WALL: No tenderness CHEST: Normal respiration, clear to auscultation bilaterally HEART: Regular rate and rhythm; no murmurs rubs or gallops BACK: No kyphosis or scoliosis; No CVA tenderness ABDOMEN: Positive Bowel Sounds, Obese, Soft Non-Tender, No Rebound or Guarding; No Masses, No Organomegaly. Rectal Exam: Not done EXTREMITIES: No  Cyanosis, Clubbing, or Edema; No Ulcerations. Genitalia: not examined PULSES:  2+ and symmetric SKIN: Normal hydration no rash or ulceration CNS:  Alert and Oriented x 2, No Focal Deficits Vascular: pulses palpable throughout    Labs on Admission:  Basic Metabolic Panel:  Recent Labs Lab 07/03/15 2116  NA 140  K 3.8  CL 102  CO2 25  GLUCOSE 115*  BUN 15  CREATININE 1.33*  CALCIUM 10.0   Liver Function Tests:  Recent Labs Lab 07/03/15 2116  AST 30  ALT 22  ALKPHOS 106  BILITOT 0.9  PROT 7.8  ALBUMIN 3.4*   No results for input(s): LIPASE, AMYLASE in the last 168 hours. No results for input(s): AMMONIA in the last 168 hours. CBC:  Recent Labs Lab 07/03/15 2116  WBC 12.2*  NEUTROABS 9.5*  HGB 14.9  HCT 44.7  MCV 84.8  PLT 492*   Cardiac Enzymes: No results for input(s): CKTOTAL, CKMB, CKMBINDEX, TROPONINI in the last 168 hours.  BNP (last 3 results)  Recent Labs  01/17/15 0149  BNP 577.8*    ProBNP (last 3 results) No results for input(s): PROBNP in the last 8760 hours.  CBG: No results for input(s): GLUCAP in the last 168 hours.  Radiological Exams on Admission: Ct Abdomen Pelvis Wo Contrast  07/04/2015   CLINICAL DATA:  81 year old male with lower abdominal pain and no bowel movement  EXAM: CT ABDOMEN AND PELVIS WITHOUT CONTRAST  TECHNIQUE: Multidetector CT  imaging of the abdomen and pelvis was performed following the standard protocol without IV contrast.  COMPARISON:  None.  FINDINGS: Evaluation of this exam is limited in the absence of intravenous contrast. Evaluation is also limited due to respiratory motion artifact. Small calcified right middle lobe granuloma. The visualized lung bases are clear. There is coronary vascular calcification.  No intra-abdominal free air or free fluid.  Small scattered hepatic hypodense lesions are not characterized but may represent a cyst or hemangioma. MRI may provide better characterization if clinically indicated. There are multiple layering stones within the gallbladder. No pericholecystic fluid or inflammatory changes identified. The pancreas, spleen, adrenal glands, kidneys caps appear unremarkable. There is a 3 mm calculus along the posterior wall of the urinary bladder at the right UVJ. The urinary bladder is decompressed around a suprapubic catheter. There is apparent thickening of the bladder wall with mild perivesical haziness. Correlation with urinalysis recommended to exclude cystitis. The prostate gland is enlarged with mass effect and elevation of the base of the bladder.  Moderate amount of dense stool noted throughout the colon and rectosigmoid. There is a large dense stool ball within the rectum compatible with fecal impaction. There is colonic diverticulosis without active inflammation. No evidence of bowel obstruction. Normal appendix.  There is aortoiliac atherosclerotic disease. 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 Evaluation of the vasculature is limited in the absence of intravenous contrast. No portal venous gas identified. There is no lymphadenopathy.  Small fat containing umbilical hernia. Osteopenia with severe degenerative changes of the spine. There is loss of bone trabeculation  involving the sacrum with areas of sclerosis this may be related to osteopenia or possible postradiation changes. A lytic neoplastic process is not excluded clinical correlation is recommended. MRI is recommended for further evaluation. There is grade 1 L4-5 anterolisthesis. No acute fracture.  IMPRESSION: Constipation with fecal impaction within the rectum. No evidence of bowel obstruction. Diverticulosis.  Cholelithiasis.  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  Lidocaine appearance of the sacrum possibly related to osteopenia for radiation. Correlation with clinical exam and MRI recommended.   Electronically Signed   By: Elgie Collard M.D.   On: 07/04/2015 00:21   Dg Chest 2 View  07/03/2015   CLINICAL DATA:  Hypoxia.  EXAM: CHEST  2 VIEW  COMPARISON:  01/19/2015  FINDINGS: Again seen elevation of left hemidiaphragm. Heart at the upper limits of normal in size, again seen tortuosity of the thoracic aorta. Pulmonary vasculature is normal. There is no consolidation, pleural effusion or pneumothorax. The bones are under mineralized without acute osseous abnormality.  IMPRESSION: No acute pulmonary process.   Electronically Signed   By: Rubye Oaks M.D.   On: 07/03/2015 21:54   Ct Head Wo Contrast  07/04/2015   CLINICAL DATA:  Altered mental status.  EXAM: CT HEAD WITHOUT CONTRAST  TECHNIQUE: Contiguous axial images were obtained from the base of the skull through the vertex without intravenous contrast.  COMPARISON:  CT head 05/30/2015, brain MRI 06/12/2015  FINDINGS: No intracranial hemorrhage, mass effect, or midline shift. No hydrocephalus. The basilar cisterns are patent. No evidence of territorial infarct. No intracranial fluid collection. Unchanged cerebral and cerebellar atrophy. 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 Unchanged chronic small vessel ischemic change. Calvarium is intact. Included paranasal sinuses and mastoid air cells are well aerated.  IMPRESSION: Stable atrophy and chronic  small vessel ischemic change throughout acute intracranial abnormality.   Electronically Signed   By: Rubye Oaks M.D.   On: 07/04/2015 00:11      Assessment/Plan:   81 y.o. male with  Active Problems:   1.     Acute encephalopathy   Cardiac Monitoring   Neuro Checks     2.     Fecal impaction- due to Pain Medications and Anticholinergic Medications   Disimpacted in the ED   Enema ordered X1   Adjust Laxitive Rx    3.     Myasthenia Gravis   Continue Pyridostigmine Rx      4.    CKD (chronic kidney disease), stage III   Gentle IVFs   Monitor BUN/Cr     5.    HTN (hypertension)   continue Metoprolol as BP and HR Tolerate   Monitor BPs     6.    Atrial fibrillation   Continue Metoprolol Rx   Continue Eliquis Rx     7.    Dyslipidemia   Continue Simvastatin Rx     8.    Dementia   Not on medications      8.    DVT Prophylaxis   On Eliquis Rx          Code Status:      DO NOT RESUSCITATE (DNR)      Family Communication:   Family at Bedside      Disposition Plan:    Observation Status        Time spent:   58 Minutes      Seth [name] [facility] Pager [phone]   If 7AM -7PM Please Contact the Day Rounding Team MD for [facility]  If 7PM-7AM, Please Contact Night-Floor Coverage  [email or web address] Password [facility] 07/04/2015, 6:44 AM     ADDENDUM:   Patient was seen and examined on 07/04/2015

## 2015-07-04 NOTE — Progress Notes (Signed)
Initial Nutrition Assessment  DOCUMENTATION CODES:   Not applicable  INTERVENTION:   -Continue Ensure Enlive po BID, each supplement provides 350 kcal and 20 grams of protein  NUTRITION DIAGNOSIS:   Inadequate oral intake related to lethargy/confusion as evidenced by meal completion < 25%.  GOAL:   Patient will meet greater than or equal to 90% of their needs  MONITOR:   PO intake, Supplement acceptance, Labs, Weight trends, Skin, I & O's  REASON FOR ASSESSMENT:   Malnutrition Screening Tool    ASSESSMENT:   Seth [name] is a 81 y.o. male resident of the [facility] sent to the ED for ABD Pain and confusion x 2 days and poor intake of foods and liquids. His family felt he was not acting right. One Week ago he had his supra-pubic catheter replaced, and the foley catheter he had in placed was discontinued 1 day ago. His family was concerned that he may have a UTI. He has had increased confusion for the past 3 weeks. EMS reported findings of hypoxia with o2 sats in the 80's on RA. He was placed on 4 liters of NCO2 on route. In the Ed a Cr scan of the head was performed and was negative for acute findings, and a Ct scan of the ABD /Pelvis reveal a Fecal Impaction so he was disimpacted in the ED and an enema was ordered. A UA was performed and sent for C+S.   Pt admitted with acute encephalopathy and fecal impaction (disimpacted in ER). He resides at [facility].   Pt was lethargic at time of visit, but answered questions appropriately. He endorses poor appetite and weight loss, however, is unable to provide further details re: frequency and time frame. He does not know his UBW. Wt hx is difficult to interpret at this time, as wt appears to fluctuate at baseline.   Meal completion 5-10%. Pt denies difficulty chewing or swallowing foods. He consumed approximately 50% of Ensure supplement on bedside table. Pt reports he likes supplement and is amenable to  drink it during hospitalization. He denies consuming supplements PTA.   Nutrition-Focused physical exam completed. Findings are mild to moderate  fat depletion, mild to moderate muscle depletion, and no edema. It is difficult to determine whether fat or muscle depletion is related to advanced age vs limited mobility (MG) vs suboptimal nutritional status, however, this RD suspects it is multifactorial.   Labs reviewed.   Diet Order:  Diet Heart Room service appropriate?: Yes; Fluid consistency:: Thin  Skin:  Wound (see comment) (stage I sacrum)  Last BM:  07/04/15  Height:   Ht Readings from Last 1 Encounters:  07/04/15  (1.778 m)    Weight:   Wt Readings from Last 1 Encounters:  07/04/15 164 lb 3.9 oz (74.5 kg)    Ideal Body Weight:  74.5 kg  BMI:  Body mass index is 23.57 kg/(m^2).  Estimated Nutritional Needs:   Kcal:  1600-1800  Protein:  80-90 grams  Fluid:  >1.6 L  EDUCATION NEEDS:   Education needs no appropriate at this time  Kesler Wickham A. Mayford Knife, RD, LDN, CDE Pager: [phone] After hours Pager: [phone]

## 2015-07-04 NOTE — Evaluation (Addendum)
Physical Therapy Evaluation Patient Details Name: Seth [name] MRN: [id number] DOB: [birth year] Today's Date: 07/04/2015   History of Present Illness  81 y.o. male admitted for Acute encephalopathy with underlying dementia, UTI, fecal impaction, medication effect. Also found to have mild acute on chronic CKD. Hx of myasthenia gravis.  Clinical Impression  Pt admitted with the above complications. Pt currently with functional limitations due to the deficits listed below (see PT Problem List). Tolerated bed mobility and transfer training today. Participated with therapeutic exercises. States he has not been receiving physical therapy however, is a poor historian and no family available during evaluation. Would benefit from PT at d/c to improve functional independence. Pt will benefit from skilled PT to increase their independence and safety with mobility to allow discharge to the venue listed below.       Follow Up Recommendations SNF    Equipment Recommendations  None recommended by PT    Recommendations for Other Services       Precautions / Restrictions Precautions Precautions: Fall Restrictions Weight Bearing Restrictions: No      Mobility  Bed Mobility Overal bed mobility: Needs Assistance Bed Mobility: Supine to Sit     Supine to sit: Mod assist     General bed mobility comments: Able to scoot to edge of bed but needed mod assist and use of rail to sit upright with trunk support. Cues for technique throughout.  Transfers Overall transfer level: Needs assistance Equipment used: Rolling walker (2 wheeled) Transfers: Sit to/from Stand Sit to Stand: Mod assist;+2 physical assistance         General transfer comment: +2 Mod assist for boost and balance. VC for hand placement. Leans heavily to posterior. Tolerated <3 minutes but progressed to more upright posture with tactile cues. Able to march feet several times. Complains of pain in knees. Decreased control with  descent.  Ambulation/Gait                Stairs            Wheelchair Mobility    Modified Rankin (Stroke Patients Only)       Balance Overall balance assessment: Needs assistance Sitting-balance support: Single extremity supported Sitting balance-Leahy Scale: Poor     Standing balance support: Bilateral upper extremity supported Standing balance-Leahy Scale: Poor                               Pertinent Vitals/Pain Pain Assessment: 0-10 Pain Score: 0-No pain Pain Intervention(s): Monitored during session    Home Living Family/patient expects to be discharged to:: Skilled nursing facility   Available Help at Discharge: Skilled Nursing Facility Type of Home: Skilled Nursing Facility         Home Equipment: Dan Humphreys - 2 wheels;Wheelchair - manual Additional Comments: Not the best historian, some difficulty recalling location of residence PTA.    Prior Function Level of Independence: Needs assistance   Gait / Transfers Assistance Needed: Wheelchair for mobility  ADL's / Homemaking Assistance Needed: States he could transfer at times without assist. Needs assist with bath/dress.        Hand Dominance   Dominant Hand: Right    Extremity/Trunk Assessment   Upper Extremity Assessment: Defer to OT evaluation           Lower Extremity Assessment: Generalized weakness         Communication   Communication: No difficulties  Cognition Arousal/Alertness: Awake/alert Behavior During Therapy:  WFL for tasks assessed/performed Overall Cognitive Status: No family/caregiver present to determine baseline cognitive functioning (Hx of dementia)                      General Comments      Exercises General Exercises - Lower Extremity Ankle Circles/Pumps: AROM;Both;15 reps;Supine Long Arc Quad: Strengthening;Both;10 reps;Seated      Assessment/Plan    PT Assessment Patient needs continued PT services  PT Diagnosis Difficulty  walking;Generalized weakness;Acute pain   PT Problem List Decreased strength;Decreased range of motion;Decreased activity tolerance;Decreased balance;Decreased mobility;Decreased cognition;Decreased knowledge of use of DME;Decreased safety awareness;Pain  PT Treatment Interventions DME instruction;Functional mobility training;Therapeutic activities;Therapeutic exercise;Balance training;Neuromuscular re-education;Cognitive remediation;Patient/family education   PT Goals (Current goals can be found in the Care Plan section) Acute Rehab PT Goals Patient Stated Goal: GO to a new facility for rehab. PT Goal Formulation: With patient Time For Goal Achievement: 07/18/15 Potential to Achieve Goals: Fair    Frequency Min 2X/week   Barriers to discharge        Co-evaluation               End of Session Equipment Utilized During Treatment: Gait belt Activity Tolerance: Patient limited by fatigue Patient left: in bed;with call bell/phone within reach;with bed alarm set;with nursing/sitter in room Nurse Communication: Mobility status         Time: 1610-9604 PT Time Calculation (min) (ACUTE ONLY): 18 min   Charges:   PT Evaluation $Initial PT Evaluation Tier I: 1 Procedure     PT G CodesBerton Mount 07/04/2015, 4:03 PM Charlsie Merles, [place] [phone]

## 2015-07-04 NOTE — Care Management Note (Signed)
Case Management Note  Patient Details  Name: Seth [name] MRN: [id number] Date of Birth: 1925-06-15  Subjective/Objective:    Patient is from [facility] , CSW referral.  Patient son is Onalee Hua [phone].  NCM will cont to follow for dc needs.                Action/Plan:   Expected Discharge Date:                  Expected Discharge Plan:  Assisted Living / Rest Home  In-House Referral:  Clinical Social Work  Discharge planning Services  CM Consult  Post Acute Care Choice:    Choice offered to:     DME Arranged:    DME Agency:     HH Arranged:    HH Agency:     Status of Service:  In process, will continue to follow  Medicare Important Message Given:    Date Medicare IM Given:    Medicare IM give by:    Date Additional Medicare IM Given:    Additional Medicare Important Message give by:     If discussed at Long Length of Stay Meetings, dates discussed:    Additional Comments:  Leone Haven, RN 07/04/2015, 5:02 PM

## 2015-07-05 DIAGNOSIS — E785 Hyperlipidemia, unspecified: Secondary | ICD-10-CM

## 2015-07-05 DIAGNOSIS — I10 Essential (primary) hypertension: Secondary | ICD-10-CM

## 2015-07-05 DIAGNOSIS — N390 Urinary tract infection, site not specified: Secondary | ICD-10-CM | POA: Diagnosis not present

## 2015-07-05 DIAGNOSIS — N183 Chronic kidney disease, stage 3 (moderate): Secondary | ICD-10-CM

## 2015-07-05 LAB — VITAMIN B12: VITAMIN B 12: 733 pg/mL (ref 180–914)

## 2015-07-05 LAB — URINE CULTURE: Culture: 30000

## 2015-07-05 MED ORDER — DOCUSATE SODIUM 100 MG PO CAPS
100.0000 mg | ORAL_CAPSULE | Freq: Two times a day (BID) | ORAL | Status: DC
  Administered 2015-07-05 – 2015-07-07 (×5): 100 mg via ORAL
  Filled 2015-07-05 (×5): qty 1

## 2015-07-05 MED ORDER — POLYETHYLENE GLYCOL 3350 17 G PO PACK: 17.0000 g | PACK | Freq: Every day | ORAL | Status: DC | PRN

## 2015-07-05 NOTE — Clinical Social Work Placement (Signed)
   CLINICAL SOCIAL WORK PLACEMENT  NOTE  Date:  07/05/2015  Patient Details  Name: Seth [name] MRN: [id number] Date of Birth: 1925/08/31  Clinical Social Work is seeking post-discharge placement for this patient at the Skilled  Nursing Facility level of care (*CSW will initial, date and re-position this form in  chart as items are completed):  Yes   Patient/family provided with [facility] Clinical Social Work Department's list of facilities offering this level of care within the geographic area requested by the patient (or if unable, by the patient's family).  Yes   Patient/family informed of their freedom to choose among providers that offer the needed level of care, that participate in Medicare, Medicaid or managed care program needed by the patient, have an available bed and are willing to accept the patient.  Yes   Patient/family informed of [facility]'s ownership interest in [facility] and [facility], as well as of the fact that they are under no obligation to receive care at these facilities.  PASRR submitted to EDS on 07/05/15     PASRR number received on 07/05/15     Existing PASRR number confirmed on       FL2 transmitted to all facilities in geographic area requested by pt/family on 07/05/15     FL2 transmitted to all facilities within larger geographic area on       Patient informed that his/her managed care company has contracts with or will negotiate with certain facilities, including the following:            Patient/family informed of bed offers received.  Patient chooses bed at       Physician recommends and patient chooses bed at      Patient to be transferred to   on  .  Patient to be transferred to facility by       Patient family notified on   of transfer.  Name of family member notified:        PHYSICIAN Please prepare priority discharge summary, including medications, Please sign FL2, Please prepare prescriptions, Please sign DNR      Additional Comment:    _______________________________________________ Darylene Price, LCSW 07/05/2015, 2:36 PM

## 2015-07-05 NOTE — Progress Notes (Signed)
Patient's son has chosen a bed at [facility]. CSW contacted Dr. Isidoro Donning- anticipate d/c Sunday 07/07/15.  Ok per [employer]- Admissions at [facility].  Weekend coverage to contact her to confirm on day of d/c.  Family will sign admission papers with Nicki either today or tomorrow.  Will facilitate d/c once d/c is confirmed.  Lorri Frederick. Jaci Lazier, [place] [phone]

## 2015-07-05 NOTE — Clinical Social Work Placement (Signed)
   CLINICAL SOCIAL WORK PLACEMENT  NOTE  Date:  07/05/2015  Patient Details  Name: Seth [name] MRN: [id number] Date of Birth: 1924-10-30  Clinical Social Work is seeking post-discharge placement for this patient at the Skilled  Nursing Facility level of care (*CSW will initial, date and re-position this form in  chart as items are completed):  Yes   Patient/family provided with [place] [facility] Clinical Social Work Department's list of facilities offering this level of care within the geographic area requested by the patient (or if unable, by the patient's family).  Yes   Patient/family informed of their freedom to choose among providers that offer the needed level of care, that participate in Medicare, Medicaid or managed care program needed by the patient, have an available bed and are willing to accept the patient.  Yes   Patient/family informed of [facility]'s ownership interest in [facility] and [facility], as well as of the fact that they are under no obligation to receive care at these facilities.  PASRR submitted to EDS on 07/05/15     PASRR number received on 07/05/15     Existing PASRR number confirmed on       FL2 transmitted to all facilities in geographic area requested by pt/family on 07/05/15     FL2 transmitted to all facilities within larger geographic area on       Patient informed that his/her managed care company has contracts with or will negotiate with certain facilities, including the following:        Yes   Patient/family informed of bed offers received.  Patient chooses bed at [facility]     Physician recommends and patient chooses bed at      Patient to be transferred to [facility] on  .  Patient to be transferred to facility by Ambulance [facility])     Patient family notified on   of transfer.  Name of family member notified:        PHYSICIAN Please prepare priority discharge summary, including medications,  Please sign FL2, Please prepare prescriptions, Please sign DNR     Additional Comment:    _______________________________________________ Darylene Price, LCSW 07/05/2015, 4:41 PM

## 2015-07-05 NOTE — Clinical Social Work Note (Signed)
Clinical Social Work Assessment  Patient Details  Name: Seth [name] MRN: [id number] Date of Birth: [birth year]  Date of referral:  07/05/15               Reason for consult:  Facility Placement                Permission sought to share information with:  Facility Medical sales representative, Family Supports (permission by [employer]) Permission granted to share information::  Yes, Verbal Permission Granted  Name::     [place] [facility], Son Onalee Hua and daughter in law- Systems analyst::     Relationship::     Contact Information:     Housing/Transportation Living arrangements for the past 2 months:  Assisted Dealer of Information:  Adult Children Patient Interpreter Needed:  None Criminal Activity/Legal Involvement Pertinent to Current Situation/Hospitalization:  No - Comment as needed Significant Relationships:  Adult Children Lives with:  Facility Resident Do you feel safe going back to the place where you live?  No (Per Son) Need for family participation in patient care:  Yes (Comment)  Care giving concerns: Current resident of [facility]- ALF. PT recommends SNF; Family does not feel patient is safe to return to ALF. MD agrees.  Social Worker assessment / plan:  Patient is alert to person only. Very pleasant gentleman noted to be sitting quietly in bed- smiling when approached by CSW.  Patient unable to provide history or actively be involved in the d/c planning process but stated it was ok to talk to his son Onalee Hua.  CSW spoke with son Onalee Hua and his wife Alvino Chapel via phone and discussed recommendation for SNF care by PT and MD.  Son fully agrees with this and does not feel that [facility] can manage his father any longer. He anticipates need for a long term care bed.  Discussed bed search process and family provided preferences. Active bed searches are in place.  Fl2 completed and placed on chart for MD's signature. Possible d/c this weekend if stable per MD.  Employment  status:  Retired Health and safety inspector:  Medicare PT Recommendations:  Skilled Nursing Facility Information / Referral to community resources:  Skilled Nursing Facility  Patient/Family's Response to care:  Son feels that patient is being cared for properly in the past by the ALF but due to increase in care issues (dementia and myostenia gravis)- feels patient would be better served in a SNF with long term care. Patient is unable to respond to care due to confusion- only states "I'm ok"  Patient/Family's Understanding of and Emotional Response to Diagnosis, Current Treatment, and Prognosis:  Son and daughter-in-law demonstrate excellent insight into the reason for patient's admission as well as current treatment needs and prognosis.  They are completely supportive of increase to SNF level of care.    Emotional Assessment Appearance:  Appears stated age Attitude/Demeanor/Rapport:   (Calm, Quiet- defers to son Onalee Hua (pt mostly confused)) Affect (typically observed):  Quiet, Calm, Pleasant Orientation:  Oriented to Self Alcohol / Substance use:  Tobacco Use (Quit smoking years ago) Psych involvement (Current and /or in the community):  No (Comment)  Discharge Needs  Concerns to be addressed:  Care Coordination Readmission within the last 30 days:  Yes Current discharge risk:  Cognitively Impaired, Dependent with Mobility Barriers to Discharge:  Continued Medical Work up   Lovette Cliche T, [place] 07/05/2015, 2:46 PM

## 2015-07-05 NOTE — Progress Notes (Signed)
[facility]                                                                              Patient Demographics  Seth [name], is a 81 y.o. male, DOB - Apr 17, 1925, ZOX:[id number]  Admit date - 07/03/2015   Admitting Physician Ron Parker, MD  Outpatient Primary MD for the patient is  Duane Lope, MD  LOS - 1   Chief Complaint  Patient presents with  . Altered Mental Status       Brief HPI   Briefly ,Seth [name] is a 81 y.o. male resident of the [facility] sent to the ED for ABD Pain and confusion x 2 days and poor intake of foods and liquids. His family felt he was not acting right. One Week ago, prior to admission, he had his supra-pubic catheter replaced, and the foley catheter he had in placed was discontinued 1 day ago. His family was concerned that he may have a UTI. He has had increased confusion for the past 3 weeks. EMS reported findings of hypoxia with o2 sats in the 80's on RA. He was placed on 4 liters of NCO2 on route. In ED, CT of the head showed no acute findings. Ct scan of the ABD /Pelvis reveal a Fecal Impaction so he was disimpacted in the ED and an enema was ordered. A UA was performed and sent for C+S.      Assessment & Plan    Principal Problem:   Acute encephalopathy with underlying dementia, UTI, fecal impaction, medication effect with sundowning - Continue gentle hydration, follow urine culture and sensitivities, continue IV levofloxacin - Patient has memory issues and sundowning noticed by the family in the last few months which has been progressively getting worse. I reviewed the MRI of the brain done on 8/24, CT head on 9/15 with the family, patient has cerebral atrophy with severe ventriculomegaly which explains his dementia issues and worsening cognitive impairment.  -TSH on 8/10 was 0.8  - PT recommending skilled nursing facility, patient's family agreeable, discussed with social work  Active  Problems:   Mild acute on chronic CKD (chronic kidney disease), stage III - Creatinine 1.3 at the time of admission, improved 1.0 with IV fluids  Lactic acidosis with dehydration - Continue gentle hydration  Fecal impaction due to pain medications, anticholinergic medications - Patient was disimpacted in ED, enema was ordered, continue bowel regimen  Essential hypertension Continue metoprolol, currently stable  Myasthenia gravis - Continue pyridostigmine   Dyslipidemia Continue simvastatin  Atrial fibrillation Currently rate controlled, continue metoprolol, eliquis   Code Status: DNR  Family Communication: Discussed in detail with patient's son and daughter-in-law on the phone in detail, all the imaging, lab results, management was reviewed with the family.   Disposition Plan: Pending skilled nursing facility  Time Spent in minutes 25 minutes  Procedures  None   Consults   None   DVT Prophylaxis eliquis  Medications  Scheduled Meds: . antiseptic oral rinse  7 mL Mouth Rinse BID  . apixaban  2.5 mg Oral BID  . cholecalciferol  2,000 Units Oral Daily  . feeding  supplement (ENSURE ENLIVE)  237 mL Oral BID BM  . levofloxacin (LEVAQUIN) IV  500 mg Intravenous Q24H  . metoprolol succinate  50 mg Oral Daily  . mirabegron ER  25 mg Oral Daily  . mirtazapine  7.5 mg Oral QHS  . multivitamin with minerals  1 tablet Oral Daily  . pyridostigmine  30 mg Oral TID  . simvastatin  10 mg Oral Daily  . sodium chloride  3 mL Intravenous Q12H   Continuous Infusions: . sodium chloride 75 mL/hr at 07/05/15 0208   PRN Meds:.acetaminophen **OR** acetaminophen, alum & mag hydroxide-simeth, HYDROmorphone (DILAUDID) injection, ondansetron **OR** ondansetron (ZOFRAN) IV, oxyCODONE   Antibiotics   Anti-infectives    Start     Dose/Rate Route Frequency Ordered Stop   07/04/15 1500  levofloxacin (LEVAQUIN) IVPB 500 mg     500 mg 100 mL/hr over 60 Minutes Intravenous Every 24  hours 07/04/15 1447          Subjective:   Seth [name] was seen and examined today.  patient is alert and oriented 1, thinks he is in jail, oriented to self. Denies any pain. Afebrile, no chest pain, shortness of breath, nausea or vomiting.     Objective:   Blood pressure 121/49, pulse 74, temperature 97.5 F (36.4 C), temperature source Oral, resp. rate 16, height 5\' 10"  (1.778 m), weight 74.5 kg (164 lb 3.9 oz), SpO2 97 %.  Wt Readings from Last 3 Encounters:  07/04/15 74.5 kg (164 lb 3.9 oz)  06/26/15 76.8 kg (169 lb 5 oz)  05/30/15 90.719 kg (200 lb)     Intake/Output Summary (Last 24 hours) at 07/05/15 1205 Last data filed at 07/05/15 1141  Gross per 24 hour  Intake 1226.75 ml  Output    825 ml  Net 401.75 ml    Exam  General: Alert and oriented x 1 Pleasant NAD  HEENT:  PERRLA, EOMI, Anicteric Sclera, mucous membranes moist.   Neck: Supple, no JVD, no masses  CVS: S1 S2 clear, RRR  Respiratory: CTAB  Abdomen: Soft, nontender, nondistended, + bowel sounds  Ext: no cyanosis clubbing or edema  Neuro: no new deficits   Skin: No rashes  Psych: Normal affect and demeanor, alert and oriented x1   Data Review   Micro Results Recent Results (from the past 240 hour(s))  Urine culture     Status: None (Preliminary result)   Collection Time: 07/03/15 10:46 PM  Result Value Ref Range Status   Specimen Description URINE, RANDOM  Final   Special Requests NONE  Final   Culture CULTURE REINCUBATED FOR BETTER GROWTH  Final   Report Status PENDING  Incomplete  MRSA PCR Screening     Status: None   Collection Time: 07/04/15  5:30 AM  Result Value Ref Range Status   MRSA by PCR NEGATIVE NEGATIVE Final    Comment:        The GeneXpert MRSA Assay (FDA approved for NASAL specimens only), is one component of a comprehensive MRSA colonization surveillance program. It is not intended to diagnose MRSA infection nor to guide or monitor treatment for MRSA  infections.     Radiology Reports Ct Abdomen Pelvis Wo Contrast  07/04/2015   CLINICAL DATA:  81 year old male with lower abdominal pain and no bowel movement  EXAM: CT ABDOMEN AND PELVIS WITHOUT CONTRAST  TECHNIQUE: Multidetector CT imaging of the abdomen and pelvis was performed following the standard protocol without IV contrast.  COMPARISON:  None.  FINDINGS: Evaluation of  this exam is limited in the absence of intravenous contrast. Evaluation is also limited due to respiratory motion artifact. Small calcified right middle lobe granuloma. The visualized lung bases are clear. There is coronary vascular calcification.  No intra-abdominal free air or free fluid.  Small scattered hepatic hypodense lesions are not characterized but may represent a cyst or hemangioma. MRI may provide better characterization if clinically indicated. There are multiple layering stones within the gallbladder. No pericholecystic fluid or inflammatory changes identified. The pancreas, spleen, adrenal glands, kidneys caps appear unremarkable. There is a 3 mm calculus along the posterior wall of the urinary bladder at the right UVJ. The urinary bladder is decompressed around a suprapubic catheter. There is apparent thickening of the bladder wall with mild perivesical haziness. Correlation with urinalysis recommended to exclude cystitis. The prostate gland is enlarged with mass effect and elevation of the base of the bladder.  Moderate amount of dense stool noted throughout the colon and rectosigmoid. There is a large dense stool ball within the rectum compatible with fecal impaction. There is colonic diverticulosis without active inflammation. No evidence of bowel obstruction. Normal appendix.  There is aortoiliac atherosclerotic disease. 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 Evaluation of the vasculature is limited in  the absence of intravenous contrast. No portal venous gas identified. There is no lymphadenopathy.  Small fat containing umbilical hernia. Osteopenia with severe degenerative changes of the spine. There is loss of bone trabeculation involving the sacrum with areas of sclerosis this may be related to osteopenia or possible postradiation changes. A lytic neoplastic process is not excluded clinical correlation is recommended. MRI is recommended for further evaluation. There is grade 1 L4-5 anterolisthesis. No acute fracture.  IMPRESSION: Constipation with fecal impaction within the rectum. No evidence of bowel obstruction. Diverticulosis.  Cholelithiasis.  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  Lidocaine appearance of the sacrum possibly related to osteopenia for radiation. Correlation with clinical exam and MRI recommended.   Electronically Signed   By: Elgie Collard M.D.   On: 07/04/2015 00:21   Dg Chest 2 View  07/03/2015   CLINICAL DATA:  Hypoxia.  EXAM: CHEST  2 VIEW  COMPARISON:  01/19/2015  FINDINGS: Again seen elevation of left hemidiaphragm. Heart at the upper limits of normal in size, again seen tortuosity of the thoracic aorta. Pulmonary vasculature is normal. There is no consolidation, pleural effusion or pneumothorax. The bones are under mineralized without acute osseous abnormality.  IMPRESSION: No acute pulmonary process.   Electronically Signed   By: Rubye Oaks M.D.   On: 07/03/2015 21:54   Ct Head Wo Contrast  07/04/2015   CLINICAL DATA:  Altered mental status.  EXAM: CT HEAD WITHOUT CONTRAST  TECHNIQUE: Contiguous axial images were obtained from the base of the skull through the vertex without intravenous contrast.  COMPARISON:  CT head 05/30/2015, brain MRI 06/12/2015  FINDINGS: No intracranial hemorrhage, mass effect, or midline shift. No hydrocephalus. The basilar cisterns are patent. No evidence of territorial infarct. No intracranial fluid collection. Unchanged cerebral and cerebellar  atrophy. 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 Unchanged chronic small vessel ischemic change. Calvarium is intact. Included paranasal sinuses and mastoid air cells are well aerated.  IMPRESSION: Stable atrophy and chronic small vessel ischemic change throughout acute intracranial abnormality.   Electronically Signed   By: Rubye Oaks M.D.   On: 07/04/2015 00:11  Mr Shirlee Latch [facility] Contrast  06/13/2015     [facility] [address], [address] [place], [place] [address] [phone]  NEUROIMAGING REPORT   STUDY DATE: 06/12/2015 PATIENT NAME: FRANKY REIER DOB: 10/30/1924 MRN: [id number]  EXAM: MR angiogram of the intracranial arteries  ORDERING CLINICIAN: Naomie Dean M.D. CLINICAL HISTORY: 81 year old man with ataxia, vertigo and syncope. COMPARISON FILMS: None  TECHNIQUE: MR angiogram of the head was obtained utilizing 3D time of  flight sequences from below the vertebrobasilar junction up to the  intracranial vasculature without contrast.  Computerized reconstructions  were obtained. CONTRAST: None IMAGING SITE: [facility],  [address]  FINDINGS: The imaged extracranial and intracranial portions of the  internal carotid arteries appear normal. The anterior cerebral arteries  appear normal. There is less flow in the right superior division of the  MCA compared to the left. In the posterior circulation, the left vertebral  artery is dominant.. No stenosis is noted within the vertebral arteries  and the basilar arteries. The right posterior cerebral artery has mild  focal stenosis within the P2 segment. The left posterior cerebral artery  appears normal. No aneurysms were identified.    06/13/2015    This is an abnormal MR angiogram of the intracranial arteries  showing mild focal stenosis within the P2 segment of the right posterior  cerebral artery that is unlikely to be clinically significant.   Additionally, there is reduced flow in the superior division of the right   MCA compared to the left, though no definite stenosis is identified.  No  aneurysms were identified.    INTERPRETING PHYSICIAN:  Richard A. Epimenio Foot, MD, PhD Certified in  Neuroimaging by American Society of Neuroimaging    Mr Angiogram Neck W [facility] Contrast  06/13/2015     [facility] [address], [address] [place], [place] [address] [phone]  NEUROIMAGING REPORT   STUDY DATE: 06/12/2015 PATIENT NAME: DAMIEAN LUKES DOB: 08-Sep-1925 MRN: [id number]  EXAM: MR angiogram of the neck arteries  ORDERING CLINICIAN: Naomie Dean M.D. CLINICAL HISTORY: 81 year old man with syncope, ataxia and vertigo. COMPARISON FILMS: None  TECHNIQUE: MR angiogram of the neck was obtained utilizing 3D  time-of-flight sequences from the aortic arch up to the intracranial  vasculature postbolus contrast infusion.  After the infusion of contrast,  2D time-of-flight noncontrast views and computerized reconstructions were  obtained. CONTRAST: 18 mL MultiHance IMAGING SITE: [facility],  [address]  FINDINGS: This study is of adequate technical quality.  There is  anterograde flow in the bilateral vertebral and carotid arteries on 2D-TOF  views.  The flow signal of the left subclavian artery has no stenosis.   The left common, internal and external carotid arteries have no stenosis.   The left vertebral artery has no stenosis from its origin up to the  vertebrobasilar junction.  On the right, the brachiocephalic trunk and  subclavian arteries have no stenosis. The right common carotid artery has  mild stenosis at the origin that is unlikely to be clinically significant.   The internal and external carotid arteries have no stenosis. The right  vertebral artery has no stenosis from its origin to the vertebrobasilar  junction. Limited views of the intracranial vasculature are unremarkable.   06/13/2015    This MR angiogram of the neck arteries shows no stenosis of  the right common carotid origin likely to be  clinically significant. Other  arteries appeared normal.    INTERPRETING PHYSICIAN:  Gerlene Burdock  Aida Puffer, MD, PhD Certified in  Neuroimaging by American Society of Neuroimaging    Mr Brain Wo Contrast  06/13/2015     [facility] [address] [place], [place] [address] [phone]  NEUROIMAGING REPORT   STUDY DATE: 06/12/2015 PATIENT NAME: PLATON AROCHO DOB: 1924/11/18 MRN: [id number]  EXAM: MRI Brain without contrast  ORDERING CLINICIAN: Naomie Dean M.D. CLINICAL HISTORY: 81 year old man with syncope, ataxia and vertigo COMPARISON FILMS: CT scan 05/30/2015  TECHNIQUE: MRI of the brain without contrast was obtained utilizing 5 mm  axial slices with T1, T2, T2 flair, SWI and diffusion weighted views.  T1  sagittal and T2 coronal views were obtained. CONTRAST: none IMAGING SITE: [facility], [address] [place], [place]  FINDINGS: On sagittal images, the spinal cord is imaged caudally to C2C3  and is normal in caliber.   The contents of the posterior fossa are of  normal size and position.   The pituitary gland and optic chiasm appear  normal.   The ventricles are moderately enlarged. Although much of this  enlargement appears to be due to moderate cortical atrophy, ventricle size  may be a little out of proportion to the extent of atrophy.   The atrophy  is most pronounced in the mesial temporal lobes.  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   There are no other abnormal extra-axial  collections of fluid.    Mild cerebellar atrophy is noted. There are T2/FLAIR hyperintense foci  noted within the right cerebellar hemisphere. The brainstem appears  normal. The deep gray matter appears normal. In the hemispheres, there are  periventricular and deep white matter T2/FLAIR hyperintense foci.  None of  these foci appears to be acute. The orbits appear normal.   The  VIIth/VIIIth nerve complex appears normal.  The mastoid air cells appear  normal.  The paranasal  sinuses appear normal.  Flow voids are identified  within the major intracerebral arteries.     Diffusion weighted images are normal.  Susceptibility weighted images are  normal.    Compared to CT scan dated 05/30/2015, there does not appear to be any  change.   06/13/2015    This is an abnormal MRI of the brain without contrast showing  the following: 1.   Moderate cortical atrophy but more severe ventriculomegaly.    Although the large ventricles could be due to primarily to the atrophy,  normal pressure hydrocephalus cannot be ruled out. 2.   Moderate age related chronic microvascular ischemic changes. 3.   There are no acute findings.    INTERPRETING PHYSICIAN:  Richard A. Epimenio Foot, MD, PhD Certified in  Neuroimaging by American Society of Neuroimaging       CBC  Recent Labs Lab 07/03/15 2116 07/04/15 0615  WBC 12.2* 12.9*  HGB 14.9 13.3  HCT 44.7 41.1  PLT 492* 427*  MCV 84.8 86.0  MCH 28.3 27.8  MCHC 33.3 32.4  RDW 14.5 14.8  LYMPHSABS 1.6  --   MONOABS 1.0  --   EOSABS 0.1  --   BASOSABS 0.0  --     Chemistries   Recent Labs Lab 07/03/15 2116 07/04/15 0615  NA 140 138  K 3.8 3.6  CL 102 105  CO2 25 22  GLUCOSE 115* 122*  BUN 15 15  CREATININE 1.33* 1.09  CALCIUM 10.0 9.1  AST 30  --   ALT 22  --   ALKPHOS 106  --   BILITOT 0.9  --    ------------------------------------------------------------------------------------------------------------------  estimated creatinine clearance is 48.4 mL/min (by C-G formula based on Cr of 1.09). ------------------------------------------------------------------------------------------------------------------ No results for input(s): HGBA1C in the last 72 hours. ------------------------------------------------------------------------------------------------------------------ No results for input(s): CHOL, HDL, LDLCALC, TRIG, CHOLHDL, LDLDIRECT in the last 72  hours. ------------------------------------------------------------------------------------------------------------------ No results for input(s): TSH, T4TOTAL, T3FREE, THYROIDAB in the last 72 hours.  Invalid input(s): FREET3 ------------------------------------------------------------------------------------------------------------------ No results for input(s): VITAMINB12, FOLATE, FERRITIN, TIBC, IRON, RETICCTPCT in the last 72 hours.  Coagulation profile No results for input(s): INR, PROTIME in the last 168 hours.  No results for input(s): DDIMER in the last 72 hours.  Cardiac Enzymes No results for input(s): CKMB, TROPONINI, MYOGLOBIN in the last 168 hours.  Invalid input(s): CK ------------------------------------------------------------------------------------------------------------------ Invalid input(s): POCBNP  No results for input(s): GLUCAP in the last 72 hours.   RAI,RIPUDEEP M.D. [facility] 07/05/2015, 12:05 PM  Pager: [phone] Between 7am to 7pm - call Pager - [phone]  After 7pm go to [email or web address] - password [email or web address]  Call night coverage person covering after 7pm

## 2015-07-06 LAB — BASIC METABOLIC PANEL
Anion gap: 5 (ref 5–15)
BUN: 12 mg/dL (ref 6–20)
CO2: 26 mmol/L (ref 22–32)
CREATININE: 0.97 mg/dL (ref 0.61–1.24)
Calcium: 8.8 mg/dL — ABNORMAL LOW (ref 8.9–10.3)
Chloride: 107 mmol/L (ref 101–111)
Glucose, Bld: 112 mg/dL — ABNORMAL HIGH (ref 65–99)
Potassium: 3.7 mmol/L (ref 3.5–5.1)
SODIUM: 138 mmol/L (ref 135–145)

## 2015-07-06 LAB — CBC
HCT: 36 % — ABNORMAL LOW (ref 39.0–52.0)
Hemoglobin: 11.6 g/dL — ABNORMAL LOW (ref 13.0–17.0)
MCH: 27.8 pg (ref 26.0–34.0)
MCHC: 32.2 g/dL (ref 30.0–36.0)
MCV: 86.3 fL (ref 78.0–100.0)
PLATELETS: 332 10*3/uL (ref 150–400)
RBC: 4.17 MIL/uL — AB (ref 4.22–5.81)
RDW: 14.8 % (ref 11.5–15.5)
WBC: 7.9 10*3/uL (ref 4.0–10.5)

## 2015-07-06 LAB — FOLATE: FOLATE: 29 ng/mL (ref >5.9)

## 2015-07-06 NOTE — Progress Notes (Signed)
   07/05/15 2151  What Happened  Was fall witnessed? No  Was patient injured? No  Patient found other (Comment) (Patient legs halfway off the bed)  Found by Staff-comment  Stated prior activity bathroom-unassisted (Laying in the bed)  Follow Up  MD notified Seth [name]  Time MD notified 2157  Family notified Yes-comment Khasir Woodrome)  Time family notified 2207  Additional tests No  Simple treatment Other (comment) (Placed pt in a room near nursing station, floor mats palced)  Adult Fall Risk Assessment  Risk Factor Category (scoring not indicated) Fall has occurred during this admission (document High fall risk)  Age 81  Fall History: Fall within 6 months prior to admission 5  Elimination; Bowel and/or Urine Incontinence 2  Elimination; Bowel and/or Urine Urgency/Frequency 2  Medications: includes PCA/Opiates, Anti-convulsants, Anti-hypertensives, Diuretics, Hypnotics, Laxatives, Sedatives, and Psychotropics 5  Patient Care Equipment 2  Mobility-Assistance 2  Mobility-Gait 2  Mobility-Sensory Deficit 0  Cognition-Awareness 1  Cognition-Impulsiveness 0  Cognition-Limitations 4  Total Score 28  Patient's Fall Risk High Fall Risk (>13 points)  Adult Fall Risk Interventions  Required Bundle Interventions *See Row Information* High fall risk - low, moderate, and high requirements implemented  Additional Interventions Fall risk signage;Individualized elimination schedule;Secure all tubes/drains;Use of appropriate toileting equipment (bedpan, BSC, etc.)  Fall with Injury Screening  Risk For Fall Injury- See Row Information  A;Nurse judgement;F  Pain Assessment  Pain Assessment No/denies pain  PCA/Epidural/Spinal Assessment  Respiratory Pattern Regular;Unlabored;Symmetrical  Neurological  Neuro (WDL) X  Level of Consciousness Alert  Orientation Level Oriented to person  Cognition Appropriate at baseline  Speech Clear  Facial Symmetry Symmetrical  Musculoskeletal   Musculoskeletal (WDL) X  Generalized Weakness Yes  Weight Bearing Restrictions No  Integumentary  Integumentary (WDL) X  Skin Color Appropriate for ethnicity  Skin Condition Dry  Skin Integrity MSAD  Moisture Associated Skin Damage Location Groin;Scrotum  Moisture Associated Skin Damage Orientation Right;Left  Moisture Associated Skin Damage Intervention Barrier cream   Patient denies pain, VS stable, Pt assessed no injuries noted. Reassured pt he had a suprapubic catheter in placed and do not get up without assistance.

## 2015-07-06 NOTE — Progress Notes (Signed)
[facility]                                                                              Patient Demographics  Seth [name], is a 81 y.o. male, DOB - [birth year], ZOX:[id number]  Admit date - 07/03/2015   Admitting Physician Ron Parker, MD  Outpatient Primary MD for the patient is  Duane Lope, MD  LOS - 2   Chief Complaint  Patient presents with  . Altered Mental Status       Brief HPI   Briefly ,Seth [name] is a 81 y.o. male resident of the [facility] sent to the ED for ABD Pain and confusion x 2 days and poor intake of foods and liquids. His family felt he was not acting right. One Week ago, prior to admission, he had his supra-pubic catheter replaced, and the foley catheter he had in placed was discontinued 1 day ago. His family was concerned that he may have a UTI. He has had increased confusion for the past 3 weeks. EMS reported findings of hypoxia with o2 sats in the 80's on RA. He was placed on 4 liters of NCO2 on route. In ED, CT of the head showed no acute findings. Ct scan of the ABD /Pelvis reveal a Fecal Impaction so he was disimpacted in the ED and an enema was ordered. A UA was performed and sent for C+S.      Assessment & Plan    Principal Problem:   Acute encephalopathy with underlying dementia, UTI, fecal impaction, medication effect with sundowning - Continue gentle hydration - urine culture showed only 30K colonies - Patient has memory issues and sundowning noticed by the family in the last few months which has been progressively getting worse. I reviewed the MRI of the brain done on 8/24, CT head on 9/15 with the family, patient has cerebral atrophy with severe ventriculomegaly which explains his dementia issues and worsening cognitive impairment.  -TSH on 8/10 was 0.8  - PT recommending SNF, per SW bed available 9/18   Active Problems:   Mild acute on chronic CKD (chronic kidney disease), stage III -  Creatinine 1.3 at the time of admission, improved 0.97  Lactic acidosis with dehydration - Continue gentle hydration  Fecal impaction due to pain medications, anticholinergic medications - Patient was disimpacted in ED, enema was ordered, continue bowel regimen  Essential hypertension Continue metoprolol, currently stable  Myasthenia gravis - Continue pyridostigmine  Dyslipidemia Continue simvastatin  Atrial fibrillation Currently rate controlled, continue metoprolol, eliquis   Code Status: DNR  Family Communication: no family at the bed side   Disposition Plan: SNF in am  Time Spent in minutes 20 minutes  Procedures  None   Consults   None   DVT Prophylaxis eliquis  Medications  Scheduled Meds: . antiseptic oral rinse  7 mL Mouth Rinse BID  . apixaban  2.5 mg Oral BID  . cholecalciferol  2,000 Units Oral Daily  . docusate sodium  100 mg Oral BID  . feeding supplement (ENSURE ENLIVE)  237 mL Oral BID BM  . levofloxacin (LEVAQUIN) IV  500 mg Intravenous  Q24H  . metoprolol succinate  50 mg Oral Daily  . mirabegron ER  25 mg Oral Daily  . mirtazapine  7.5 mg Oral QHS  . multivitamin with minerals  1 tablet Oral Daily  . pyridostigmine  30 mg Oral TID  . simvastatin  10 mg Oral Daily  . sodium chloride  3 mL Intravenous Q12H   Continuous Infusions: . sodium chloride 75 mL/hr at 07/06/15 0332   PRN Meds:.acetaminophen **OR** acetaminophen, alum & mag hydroxide-simeth, HYDROmorphone (DILAUDID) injection, ondansetron **OR** ondansetron (ZOFRAN) IV, oxyCODONE, polyethylene glycol   Antibiotics   Anti-infectives    Start     Dose/Rate Route Frequency Ordered Stop   07/04/15 1500  levofloxacin (LEVAQUIN) IVPB 500 mg     500 mg 100 mL/hr over 60 Minutes Intravenous Every 24 hours 07/04/15 1447          Subjective:   Seth [name] was seen and examined today. Denies any complaints, no fevers or chills. Denies any pain. No chest pain, shortness of breath,  nausea or vomiting.     Objective:   Blood pressure 132/56, pulse 67, temperature 98 F (36.7 C), temperature source Oral, resp. rate 20, height  (1.778 m), weight 77.8 kg (171 lb 8.3 oz), SpO2 96 %.  Wt Readings from Last 3 Encounters:  07/06/15 77.8 kg (171 lb 8.3 oz)  06/26/15 76.8 kg (169 lb 5 oz)  05/30/15 90.719 kg (200 lb)     Intake/Output Summary (Last 24 hours) at 07/06/15 1347 Last data filed at 07/06/15 0700  Gross per 24 hour  Intake    470 ml  Output    850 ml  Net   -380 ml    Exam  General: Alert and oriented x 1 Pleasant NAD  HEENT:  PERRLA, EOMI  Neck: Supple, no JVD  CVS: S1 S2 clear, RRR  Respiratory: CTAB  Abdomen: Soft, nontender, nondistended, + bowel sounds  Ext: no cyanosis clubbing or edema  Neuro: no new deficits   Skin: No rashes  Psych: Normal affect and demeanor, alert and oriented x1   Data Review   Micro Results Recent Results (from the past 240 hour(s))  Urine culture     Status: None   Collection Time: 07/03/15 10:46 PM  Result Value Ref Range Status   Specimen Description URINE, RANDOM  Final   Special Requests NONE  Final   Culture 30,000 COLONIES/mL YEAST  Final   Report Status 07/05/2015 FINAL  Final  MRSA PCR Screening     Status: None   Collection Time: 07/04/15  5:30 AM  Result Value Ref Range Status   MRSA by PCR NEGATIVE NEGATIVE Final    Comment:        The GeneXpert MRSA Assay (FDA approved for NASAL specimens only), is one component of a comprehensive MRSA colonization surveillance program. It is not intended to diagnose MRSA infection nor to guide or monitor treatment for MRSA infections.     Radiology Reports Ct Abdomen Pelvis Wo Contrast  07/04/2015   CLINICAL DATA:  81 year old male with lower abdominal pain and no bowel movement  EXAM: CT ABDOMEN AND PELVIS WITHOUT CONTRAST  TECHNIQUE: Multidetector CT imaging of the abdomen and pelvis was performed following the standard protocol  without IV contrast.  COMPARISON:  None.  FINDINGS: Evaluation of this exam is limited in the absence of intravenous contrast. Evaluation is also limited due to respiratory motion artifact. Small calcified right middle lobe granuloma. The visualized lung bases are clear.  There is coronary vascular calcification.  No intra-abdominal free air or free fluid.  Small scattered hepatic hypodense lesions are not characterized but may represent a cyst or hemangioma. MRI may provide better characterization if clinically indicated. There are multiple layering stones within the gallbladder. No pericholecystic fluid or inflammatory changes identified. The pancreas, spleen, adrenal glands, kidneys caps appear unremarkable. There is a 3 mm calculus along the posterior wall of the urinary bladder at the right UVJ. The urinary bladder is decompressed around a suprapubic catheter. There is apparent thickening of the bladder wall with mild perivesical haziness. Correlation with urinalysis recommended to exclude cystitis. The prostate gland is enlarged with mass effect and elevation of the base of the bladder.  Moderate amount of dense stool noted throughout the colon and rectosigmoid. There is a large dense stool ball within the rectum compatible with fecal impaction. There is colonic diverticulosis without active inflammation. No evidence of bowel obstruction. Normal appendix.  There is aortoiliac atherosclerotic disease. 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 Evaluation of the vasculature is limited in the absence of intravenous contrast. No portal venous gas identified. There is no lymphadenopathy.  Small fat containing umbilical hernia. Osteopenia with severe degenerative changes of the spine. There is loss of bone trabeculation involving the sacrum with areas of sclerosis this may be related to osteopenia or  possible postradiation changes. A lytic neoplastic process is not excluded clinical correlation is recommended. MRI is recommended for further evaluation. There is grade 1 L4-5 anterolisthesis. No acute fracture.  IMPRESSION: Constipation with fecal impaction within the rectum. No evidence of bowel obstruction. Diverticulosis.  Cholelithiasis.  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  Lidocaine appearance of the sacrum possibly related to osteopenia for radiation. Correlation with clinical exam and MRI recommended.   Electronically Signed   By: Elgie Collard M.D.   On: 07/04/2015 00:21   Dg Chest 2 View  07/03/2015   CLINICAL DATA:  Hypoxia.  EXAM: CHEST  2 VIEW  COMPARISON:  01/19/2015  FINDINGS: Again seen elevation of left hemidiaphragm. Heart at the upper limits of normal in size, again seen tortuosity of the thoracic aorta. Pulmonary vasculature is normal. There is no consolidation, pleural effusion or pneumothorax. The bones are under mineralized without acute osseous abnormality.  IMPRESSION: No acute pulmonary process.   Electronically Signed   By: Rubye Oaks M.D.   On: 07/03/2015 21:54   Ct Head Wo Contrast  07/04/2015   CLINICAL DATA:  Altered mental status.  EXAM: CT HEAD WITHOUT CONTRAST  TECHNIQUE: Contiguous axial images were obtained from the base of the skull through the vertex without intravenous contrast.  COMPARISON:  CT head 05/30/2015, brain MRI 06/12/2015  FINDINGS: No intracranial hemorrhage, mass effect, or midline shift. No hydrocephalus. The basilar cisterns are patent. No evidence of territorial infarct. No intracranial fluid collection. Unchanged cerebral and cerebellar atrophy. 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 Unchanged chronic small vessel ischemic change. Calvarium is intact. Included paranasal sinuses and mastoid air cells are well aerated.  IMPRESSION: Stable atrophy and chronic small vessel ischemic change throughout acute intracranial abnormality.    Electronically Signed   By: Rubye Oaks M.D.   On: 07/04/2015 00:11   Mr Shirlee Latch [facility] Contrast  06/13/2015     [facility] [address] [place], [place] [address] [phone]  NEUROIMAGING REPORT   STUDY  DATE: 06/12/2015 PATIENT NAME: Seth [name] DOB: 03/10/1925 MRN: [id number]  EXAM: MR angiogram of the intracranial arteries  ORDERING CLINICIAN: Naomie Dean M.D. CLINICAL HISTORY: 81 year old man with ataxia, vertigo and syncope. COMPARISON FILMS: None  TECHNIQUE: MR angiogram of the head was obtained utilizing 3D time of  flight sequences from below the vertebrobasilar junction up to the  intracranial vasculature without contrast.  Computerized reconstructions  were obtained. CONTRAST: None IMAGING SITE: [facility],  [address]  FINDINGS: The imaged extracranial and intracranial portions of the  internal carotid arteries appear normal. The anterior cerebral arteries  appear normal. There is less flow in the right superior division of the  MCA compared to the left. In the posterior circulation, the left vertebral  artery is dominant.. No stenosis is noted within the vertebral arteries  and the basilar arteries. The right posterior cerebral artery has mild  focal stenosis within the P2 segment. The left posterior cerebral artery  appears normal. No aneurysms were identified.    06/13/2015    This is an abnormal MR angiogram of the intracranial arteries  showing mild focal stenosis within the P2 segment of the right posterior  cerebral artery that is unlikely to be clinically significant.   Additionally, there is reduced flow in the superior division of the right  MCA compared to the left, though no definite stenosis is identified.  No  aneurysms were identified.    INTERPRETING PHYSICIAN:  Richard A. Epimenio Foot, MD, PhD Certified in  Neuroimaging by American Society of Neuroimaging    Mr Angiogram Neck W [facility] Contrast  06/13/2015     [facility]  [address] [place], [place] [address] [phone]  NEUROIMAGING REPORT   STUDY DATE: 06/12/2015 PATIENT NAME: Seth [name] DOB: 30-Jun-1925 MRN: [id number]  EXAM: MR angiogram of the neck arteries  ORDERING CLINICIAN: Naomie Dean M.D. CLINICAL HISTORY: 81 year old man with syncope, ataxia and vertigo. COMPARISON FILMS: None  TECHNIQUE: MR angiogram of the neck was obtained utilizing 3D  time-of-flight sequences from the aortic arch up to the intracranial  vasculature postbolus contrast infusion.  After the infusion of contrast,  2D time-of-flight noncontrast views and computerized reconstructions were  obtained. CONTRAST: 18 mL [facility] IMAGING SITE: [facility],  [address]  FINDINGS: This study is of adequate technical quality.  There is  anterograde flow in the bilateral vertebral and carotid arteries on 2D-TOF  views.  The flow signal of the left subclavian artery has no stenosis.   The left common, internal and external carotid arteries have no stenosis.   The left vertebral artery has no stenosis from its origin up to the  vertebrobasilar junction.  On the right, the brachiocephalic trunk and  subclavian arteries have no stenosis. The right common carotid artery has  mild stenosis at the origin that is unlikely to be clinically significant.   The internal and external carotid arteries have no stenosis. The right  vertebral artery has no stenosis from its origin to the vertebrobasilar  junction. Limited views of the intracranial vasculature are unremarkable.   06/13/2015    This MR angiogram of the neck arteries shows no stenosis of  the right common carotid origin likely to be clinically significant. Other  arteries appeared normal.    INTERPRETING PHYSICIAN:  Richard A. Epimenio Foot, MD, PhD Certified in  Neuroimaging by American Society of Neuroimaging    Mr Brain Wo Contrast  06/13/2015     [facility] [address]  [address], [address] [place], [place] [address] [phone]  NEUROIMAGING REPORT   STUDY DATE: 06/12/2015 PATIENT NAME: Seth [name] DOB: [birth year] MRN: [id number]  EXAM: MRI Brain without contrast  ORDERING CLINICIAN: Naomie Dean M.D. CLINICAL HISTORY: 81 year old man with syncope, ataxia and vertigo COMPARISON FILMS: CT scan 05/30/2015  TECHNIQUE: MRI of the brain without contrast was obtained utilizing 5 mm  axial slices with T1, T2, T2 flair, SWI and diffusion weighted views.  T1  sagittal and T2 coronal views were obtained. CONTRAST: none IMAGING SITE: [facility], [address] [place], [place]  FINDINGS: On sagittal images, the spinal cord is imaged caudally to C2C3  and is normal in caliber.   The contents of the posterior fossa are of  normal size and position.   The pituitary gland and optic chiasm appear  normal.   The ventricles are moderately enlarged. Although much of this  enlargement appears to be due to moderate cortical atrophy, ventricle size  may be a little out of proportion to the extent of atrophy.   The atrophy  is most pronounced in the mesial temporal lobes.  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   There are no other abnormal extra-axial  collections of fluid.    Mild cerebellar atrophy is noted. There are T2/FLAIR hyperintense foci  noted within the right cerebellar hemisphere. The brainstem appears  normal. The deep gray matter appears normal. In the hemispheres, there are  periventricular and deep white matter T2/FLAIR hyperintense foci.  None of  these foci appears to be acute. The orbits appear normal.   The  VIIth/VIIIth nerve complex appears normal.  The mastoid air cells appear  normal.  The paranasal sinuses appear normal.  Flow voids are identified  within the major intracerebral arteries.     Diffusion weighted images are normal.  Susceptibility weighted images are  normal.    Compared to CT scan dated 05/30/2015, there does not appear to be any  change.   06/13/2015    This is an abnormal MRI of  the brain without contrast showing  the following: 1.   Moderate cortical atrophy but more severe ventriculomegaly.    Although the large ventricles could be due to primarily to the atrophy,  normal pressure hydrocephalus cannot be ruled out. 2.   Moderate age related chronic microvascular ischemic changes. 3.   There are no acute findings.    INTERPRETING PHYSICIAN:  Richard A. Epimenio Foot, MD, PhD Certified in  Neuroimaging by American Society of Neuroimaging       CBC  Recent Labs Lab 07/03/15 2116 07/04/15 0615 07/06/15 0855  WBC 12.2* 12.9* 7.9  HGB 14.9 13.3 11.6*  HCT 44.7 41.1 36.0*  PLT 492* 427* 332  MCV 84.8 86.0 86.3  MCH 28.3 27.8 27.8  MCHC 33.3 32.4 32.2  RDW 14.5 14.8 14.8  LYMPHSABS 1.6  --   --   MONOABS 1.0  --   --   EOSABS 0.1  --   --   BASOSABS 0.0  --   --     Chemistries   Recent Labs Lab 07/03/15 2116 07/04/15 0615 07/06/15 0855  NA 140 138 138  K 3.8 3.6 3.7  CL 102 105 107  CO2 25 22 26   GLUCOSE 115* 122* 112*  BUN 15 15 12   CREATININE 1.33* 1.09 0.97  CALCIUM 10.0 9.1 8.8*  AST 30  --   --   ALT 22  --   --   ALKPHOS  106  --   --   BILITOT 0.9  --   --    ------------------------------------------------------------------------------------------------------------------ estimated creatinine clearance is 54.4 mL/min (by C-G formula based on Cr of 0.97). ------------------------------------------------------------------------------------------------------------------ No results for input(s): HGBA1C in the last 72 hours. ------------------------------------------------------------------------------------------------------------------ No results for input(s): CHOL, HDL, LDLCALC, TRIG, CHOLHDL, LDLDIRECT in the last 72 hours. ------------------------------------------------------------------------------------------------------------------ No results for input(s): TSH, T4TOTAL, T3FREE, THYROIDAB in the last 72 hours.  Invalid input(s):  FREET3 ------------------------------------------------------------------------------------------------------------------  Recent Labs  07/05/15 1305 07/06/15 0855  VITAMINB12 733  --   FOLATE  --  29.0    Coagulation profile No results for input(s): INR, PROTIME in the last 168 hours.  No results for input(s): DDIMER in the last 72 hours.  Cardiac Enzymes No results for input(s): CKMB, TROPONINI, MYOGLOBIN in the last 168 hours.  Invalid input(s): CK ------------------------------------------------------------------------------------------------------------------ Invalid input(s): POCBNP  No results for input(s): GLUCAP in the last 72 hours.   RAI,RIPUDEEP M.D. [facility] 07/06/2015, 1:47 PM  Pager: [phone] Between 7am to 7pm - call Pager - [phone]  After 7pm go to [email or web address] - password [email or web address]  Call night coverage person covering after 7pm

## 2015-07-07 DIAGNOSIS — I10 Essential (primary) hypertension: Secondary | ICD-10-CM | POA: Diagnosis not present

## 2015-07-07 DIAGNOSIS — E7800 Pure hypercholesterolemia, unspecified: Secondary | ICD-10-CM | POA: Diagnosis present

## 2015-07-07 DIAGNOSIS — T849XXA Unspecified complication of internal orthopedic prosthetic device, implant and graft, initial encounter: Secondary | ICD-10-CM | POA: Diagnosis not present

## 2015-07-07 DIAGNOSIS — F4320 Adjustment disorder, unspecified: Secondary | ICD-10-CM | POA: Diagnosis not present

## 2015-07-07 DIAGNOSIS — I482 Chronic atrial fibrillation: Secondary | ICD-10-CM | POA: Diagnosis not present

## 2015-07-07 DIAGNOSIS — D72829 Elevated white blood cell count, unspecified: Secondary | ICD-10-CM | POA: Diagnosis not present

## 2015-07-07 DIAGNOSIS — R488 Other symbolic dysfunctions: Secondary | ICD-10-CM | POA: Diagnosis not present

## 2015-07-07 DIAGNOSIS — G7000 Myasthenia gravis without (acute) exacerbation: Secondary | ICD-10-CM | POA: Diagnosis not present

## 2015-07-07 DIAGNOSIS — R509 Fever, unspecified: Secondary | ICD-10-CM | POA: Diagnosis not present

## 2015-07-07 DIAGNOSIS — N179 Acute kidney failure, unspecified: Secondary | ICD-10-CM | POA: Diagnosis not present

## 2015-07-07 DIAGNOSIS — Z87891 Personal history of nicotine dependence: Secondary | ICD-10-CM | POA: Diagnosis not present

## 2015-07-07 DIAGNOSIS — F0390 Unspecified dementia without behavioral disturbance: Secondary | ICD-10-CM | POA: Diagnosis present

## 2015-07-07 DIAGNOSIS — Z8042 Family history of malignant neoplasm of prostate: Secondary | ICD-10-CM | POA: Diagnosis not present

## 2015-07-07 DIAGNOSIS — R7881 Bacteremia: Secondary | ICD-10-CM | POA: Diagnosis not present

## 2015-07-07 DIAGNOSIS — M6281 Muscle weakness (generalized): Secondary | ICD-10-CM | POA: Diagnosis not present

## 2015-07-07 DIAGNOSIS — A4159 Other Gram-negative sepsis: Secondary | ICD-10-CM | POA: Diagnosis present

## 2015-07-07 DIAGNOSIS — T83498A Other mechanical complication of other prosthetic devices, implants and grafts of genital tract, initial encounter: Secondary | ICD-10-CM | POA: Diagnosis not present

## 2015-07-07 DIAGNOSIS — N493 Fournier gangrene: Secondary | ICD-10-CM | POA: Diagnosis present

## 2015-07-07 DIAGNOSIS — E46 Unspecified protein-calorie malnutrition: Secondary | ICD-10-CM | POA: Diagnosis present

## 2015-07-07 DIAGNOSIS — I480 Paroxysmal atrial fibrillation: Secondary | ICD-10-CM | POA: Diagnosis not present

## 2015-07-07 DIAGNOSIS — A419 Sepsis, unspecified organism: Secondary | ICD-10-CM | POA: Diagnosis not present

## 2015-07-07 DIAGNOSIS — Z66 Do not resuscitate: Secondary | ICD-10-CM | POA: Diagnosis present

## 2015-07-07 DIAGNOSIS — F0391 Unspecified dementia with behavioral disturbance: Secondary | ICD-10-CM | POA: Diagnosis not present

## 2015-07-07 DIAGNOSIS — R2681 Unsteadiness on feet: Secondary | ICD-10-CM | POA: Diagnosis not present

## 2015-07-07 DIAGNOSIS — Z87440 Personal history of urinary (tract) infections: Secondary | ICD-10-CM | POA: Diagnosis not present

## 2015-07-07 DIAGNOSIS — R6521 Severe sepsis with septic shock: Secondary | ICD-10-CM | POA: Diagnosis not present

## 2015-07-07 DIAGNOSIS — Z8673 Personal history of transient ischemic attack (TIA), and cerebral infarction without residual deficits: Secondary | ICD-10-CM | POA: Diagnosis not present

## 2015-07-07 DIAGNOSIS — Y838 Other surgical procedures as the cause of abnormal reaction of the patient, or of later complication, without mention of misadventure at the time of the procedure: Secondary | ICD-10-CM | POA: Diagnosis present

## 2015-07-07 DIAGNOSIS — E785 Hyperlipidemia, unspecified: Secondary | ICD-10-CM | POA: Diagnosis not present

## 2015-07-07 DIAGNOSIS — G9340 Encephalopathy, unspecified: Secondary | ICD-10-CM | POA: Diagnosis not present

## 2015-07-07 DIAGNOSIS — Z9181 History of falling: Secondary | ICD-10-CM | POA: Diagnosis not present

## 2015-07-07 DIAGNOSIS — Z515 Encounter for palliative care: Secondary | ICD-10-CM | POA: Diagnosis not present

## 2015-07-07 DIAGNOSIS — N390 Urinary tract infection, site not specified: Secondary | ICD-10-CM | POA: Diagnosis not present

## 2015-07-07 DIAGNOSIS — N183 Chronic kidney disease, stage 3 (moderate): Secondary | ICD-10-CM | POA: Diagnosis not present

## 2015-07-07 DIAGNOSIS — I350 Nonrheumatic aortic (valve) stenosis: Secondary | ICD-10-CM | POA: Diagnosis present

## 2015-07-07 DIAGNOSIS — Z7901 Long term (current) use of anticoagulants: Secondary | ICD-10-CM | POA: Diagnosis not present

## 2015-07-07 DIAGNOSIS — R05 Cough: Secondary | ICD-10-CM | POA: Diagnosis not present

## 2015-07-07 DIAGNOSIS — I4891 Unspecified atrial fibrillation: Secondary | ICD-10-CM | POA: Diagnosis present

## 2015-07-07 DIAGNOSIS — I9589 Other hypotension: Secondary | ICD-10-CM | POA: Diagnosis not present

## 2015-07-07 DIAGNOSIS — Z881 Allergy status to other antibiotic agents status: Secondary | ICD-10-CM | POA: Diagnosis not present

## 2015-07-07 DIAGNOSIS — I129 Hypertensive chronic kidney disease with stage 1 through stage 4 chronic kidney disease, or unspecified chronic kidney disease: Secondary | ICD-10-CM | POA: Diagnosis present

## 2015-07-07 DIAGNOSIS — Z79899 Other long term (current) drug therapy: Secondary | ICD-10-CM | POA: Diagnosis not present

## 2015-07-07 DIAGNOSIS — T83518A Infection and inflammatory reaction due to other urinary catheter, initial encounter: Secondary | ICD-10-CM | POA: Diagnosis present

## 2015-07-07 LAB — BASIC METABOLIC PANEL
ANION GAP: 7 (ref 5–15)
BUN: 10 mg/dL (ref 6–20)
CHLORIDE: 104 mmol/L (ref 101–111)
CO2: 26 mmol/L (ref 22–32)
CREATININE: 1.01 mg/dL (ref 0.61–1.24)
Calcium: 9.1 mg/dL (ref 8.9–10.3)
GFR calc non Af Amer: >60 mL/min (ref >60)
GLUCOSE: 123 mg/dL — AB (ref 65–99)
Potassium: 3.9 mmol/L (ref 3.5–5.1)
Sodium: 137 mmol/L (ref 135–145)

## 2015-07-07 LAB — GLUCOSE, CAPILLARY
GLUCOSE-CAPILLARY: 180 mg/dL — AB (ref 65–99)
Glucose-Capillary: 121 mg/dL — ABNORMAL HIGH (ref 65–99)

## 2015-07-07 LAB — CBC
HEMATOCRIT: 37.6 % — AB (ref 39.0–52.0)
HEMOGLOBIN: 12.2 g/dL — AB (ref 13.0–17.0)
MCH: 27.9 pg (ref 26.0–34.0)
MCHC: 32.4 g/dL (ref 30.0–36.0)
MCV: 86 fL (ref 78.0–100.0)
Platelets: 367 10*3/uL (ref 150–400)
RBC: 4.37 MIL/uL (ref 4.22–5.81)
RDW: 14.7 % (ref 11.5–15.5)
WBC: 9.1 10*3/uL (ref 4.0–10.5)

## 2015-07-07 MED ORDER — ENSURE ENLIVE PO LIQD: 237.0000 mL | Freq: Two times a day (BID) | ORAL | Status: DC

## 2015-07-07 MED ORDER — TRAMADOL HCL 50 MG PO TABS: 50.0000 mg | ORAL_TABLET | Freq: Four times a day (QID) | ORAL | Status: DC | PRN

## 2015-07-07 MED ORDER — POLYETHYLENE GLYCOL 3350 17 G PO PACK: 17.0000 g | PACK | Freq: Every day | ORAL | Status: DC | PRN

## 2015-07-07 MED ORDER — DOCUSATE SODIUM 100 MG PO CAPS: 100.0000 mg | ORAL_CAPSULE | Freq: Two times a day (BID) | ORAL | Status: DC

## 2015-07-07 NOTE — Progress Notes (Signed)
Nsg Discharge Note  Admit Date:  07/03/2015 Discharge date: 07/07/2015   Seth [name] to be D/C'd Nursing Home per MD order.  AVS completed.  Copy for chart, and copy for patient signed, and dated. Patient/caregiver able to verbalize understanding.  Discharge Medication:   Medication List    TAKE these medications        acetaminophen 500 MG tablet  Commonly known as:  TYLENOL  Take 500 mg by mouth every 6 (six) hours as needed for mild pain. NOT on nursing home MAR     cetaphil cream  Apply 1 application topically as needed (for feet).     docusate sodium 100 MG capsule  Commonly known as:  COLACE  Take 1 capsule (100 mg total) by mouth 2 (two) times [name].     ELIQUIS 2.5 MG Tabs tablet  Generic drug:  apixaban  Take 2.5 mg by mouth 2 (two) times [name].     feeding supplement (ENSURE ENLIVE) Liqd  Take 237 mLs by mouth 2 (two) times [name] between meals.     metoprolol succinate 50 MG 24 hr tablet  Commonly known as:  TOPROL-XL  Take 50 mg by mouth every morning. Take with or immediately following a meal.     mirtazapine 7.5 MG tablet  Commonly known as:  REMERON  Take 7.5 mg by mouth at bedtime.     multivitamin with minerals Tabs tablet  Take 1 tablet by mouth [name].     MYRBETRIQ 25 MG Tb24 tablet  Generic drug:  mirabegron ER  Take 25 mg by mouth [name].     polyethylene glycol packet  Commonly known as:  MIRALAX / GLYCOLAX  Take 17 g by mouth [name] as needed for moderate constipation.     predniSONE 10 MG tablet  Commonly known as:  DELTASONE  Take 1 tablet (10 mg total) by mouth [name] with breakfast.     pyridostigmine 60 MG tablet  Commonly known as:  MESTINON  Take 1 tablet (60 mg total) by mouth 3 (three) times [name].     senna 8.6 MG Tabs tablet  Commonly known as:  SENOKOT  Take 1 tablet by mouth at bedtime.     simvastatin 10 MG tablet  Commonly known as:  ZOCOR  Take 10 mg by mouth [name].     traMADol 50 MG tablet  Commonly known as:   ULTRAM  Take 1 tablet (50 mg total) by mouth every 6 (six) hours as needed for moderate pain.     Vitamin D3 2000 UNITS Tabs  Take 2,000 Units by mouth [name].        Discharge Assessment: Filed Vitals:   07/07/15 1228  BP: 109/67  Pulse: 84  Temp: 97.8 F (36.6 C)  Resp: 16   Skin clean, dry with some nonblancheable redness to bottom. . IV catheter discontinued intact. Site without signs and symptoms of complications - no redness or edema noted at insertion site, patient denies c/o pain - only slight tenderness at site.  Dressing with slight pressure applied.  D/c Instructions-Education: Discharge instructions given to patient/family with verbalized understanding. D/c education completed with patient/family including follow up instructions, medication list, d/c activities limitations if indicated, with other d/c instructions as indicated by MD - patient able to verbalize understanding, all questions fully answered. Patient instructed to return to ED, call 911, or call MD for any changes in condition.  Patient escorted via WC, and D/C home via private auto.  [name], Ginger Consuella Lose,  RN 07/07/2015 3:42 PM

## 2015-07-07 NOTE — Addendum Note (Signed)
Addended by: Naomie Dean B on: 07/07/2015 08:48 PM   Modules accepted: Orders

## 2015-07-07 NOTE — Discharge Summary (Signed)
Physician Discharge Summary   Patient ID: MELO STAUBER MRN: [id number] DOB/AGE: Nov 03, 1924 81 y.o.  Admit date: 07/03/2015 Discharge date: 07/07/2015  Primary Care Physician:   Duane Lope, MD  Discharge Diagnoses:    . Acute encephalopathy . Fecal impaction . Atrial fibrillation . CKD (chronic kidney disease), stage III . Dementia . HTN (hypertension) . Dyslipidemia . AP (abdominal pain)  Consults: none    Recommendations for Outpatient Follow-up:  Fall precautions  TESTS THAT NEED FOLLOW-UP Cbc, BMET   DIET: heart healthy    Allergies:   Allergies  Allergen Reactions  . Ceftin [Cefuroxime] Diarrhea    Severe diarrhea     Discharge Medications:   Medication List    TAKE these medications        acetaminophen 500 MG tablet  Commonly known as:  TYLENOL  Take 500 mg by mouth every 6 (six) hours as needed for mild pain. NOT on nursing home MAR     cetaphil cream  Apply 1 application topically as needed (for feet).     docusate sodium 100 MG capsule  Commonly known as:  COLACE  Take 1 capsule (100 mg total) by mouth 2 (two) times daily.     ELIQUIS 2.5 MG Tabs tablet  Generic drug:  apixaban  Take 2.5 mg by mouth 2 (two) times daily.     feeding supplement (ENSURE ENLIVE) Liqd  Take 237 mLs by mouth 2 (two) times daily between meals.     metoprolol succinate 50 MG 24 hr tablet  Commonly known as:  TOPROL-XL  Take 50 mg by mouth every morning. Take with or immediately following a meal.     mirtazapine 7.5 MG tablet  Commonly known as:  REMERON  Take 7.5 mg by mouth at bedtime.     multivitamin with minerals Tabs tablet  Take 1 tablet by mouth daily.     MYRBETRIQ 25 MG Tb24 tablet  Generic drug:  mirabegron ER  Take 25 mg by mouth daily.     polyethylene glycol packet  Commonly known as:  MIRALAX / GLYCOLAX  Take 17 g by mouth daily as needed for moderate constipation.     predniSONE 10 MG tablet  Commonly known as:  DELTASONE  Take  1 tablet (10 mg total) by mouth daily with breakfast.     pyridostigmine 60 MG tablet  Commonly known as:  MESTINON  Take 1 tablet (60 mg total) by mouth 3 (three) times daily.     senna 8.6 MG Tabs tablet  Commonly known as:  SENOKOT  Take 1 tablet by mouth at bedtime.     simvastatin 10 MG tablet  Commonly known as:  ZOCOR  Take 10 mg by mouth daily.     traMADol 50 MG tablet  Commonly known as:  ULTRAM  Take 1 tablet (50 mg total) by mouth every 6 (six) hours as needed for moderate pain.     Vitamin D3 2000 UNITS Tabs  Take 2,000 Units by mouth daily.         Brief H and P: For complete details please refer to admission H and P, but in brief Briefly ,Seth [name] is a 81 y.o. male resident of the [facility] sent to the ED for ABD Pain and confusion x 2 days and poor intake of foods and liquids. His family felt he was not acting right. One Week ago, prior to admission, he had his supra-pubic catheter replaced, and the foley catheter he  had in placed was discontinued 1 day ago. His family was concerned that he may have a UTI. He has had increased confusion for the past 3 weeks. EMS reported findings of hypoxia with o2 sats in the 80's on RA. He was placed on 4 liters of NCO2 on route. In ED, CT of the head showed no acute findings. Ct scan of the ABD /Pelvis reveal a Fecal Impaction so he was disimpacted in the ED and an enema was ordered. A UA was performed and sent for C+S.   Hospital Course:   Acute encephalopathy with underlying dementia, UTI, fecal impaction, medication effect with sundowning -Patient was placed on gentle hydration. urine culture showed only 30K colonies - Patient has memory issues and sundowning noticed by the family in the last few months which has been progressively getting worse. I reviewed the MRI of the brain done on 8/24, CT head on 9/15 with the family, patient has cerebral atrophy with severe ventriculomegaly which explains  his dementia issues and worsening cognitive impairment.  -TSH on 8/10 was 0.8  Physical therapy recommended skilled nursing facility.    Mild acute on chronic CKD (chronic kidney disease), stage III - Creatinine 1.3 at the time of admission, improved 1.0.  Lactic acidosis with dehydration - Improved  Fecal impaction due to pain medications, anticholinergic medications - Patient was disimpacted in ED, enema was ordered, continue bowel regimen  Essential hypertension Continue metoprolol, currently stable  Myasthenia gravis - Continue pyridostigmine, prednisone, mirabregon. Follow up outpatient with neurology, Dr. Lucia Gaskins in 2-3 weeks.   Dyslipidemia Continue simvastatin  Atrial fibrillation Currently rate controlled, continue metoprolol, eliquis    Day of Discharge BP 137/65 mmHg  Pulse 70  Temp(Src) 97.7 F (36.5 C) (Oral)  Resp 16  Ht  (1.778 m)  Wt 77.8 kg (171 lb 8.3 oz)  BMI 24.61 kg/m2  SpO2 97%  Physical Exam: General: Alert and awake oriented x2 not in any acute distress. HEENT: anicteric sclera, pupils reactive to light and accommodation CVS: S1-S2 clear no murmur rubs or gallops Chest: clear to auscultation bilaterally, no wheezing rales or rhonchi Abdomen: soft nontender, nondistended, normal bowel sounds Extremities: no cyanosis, clubbing or edema noted bilaterally Neuro: Cranial nerves II-XII intact, no focal neurological deficits   The results of significant diagnostics from this hospitalization (including imaging, microbiology, ancillary and laboratory) are listed below for reference.    LAB RESULTS: Basic Metabolic Panel:  Recent Labs Lab 07/06/15 0855 07/07/15 0546  NA 138 137  K 3.7 3.9  CL 107 104  CO2 26 26  GLUCOSE 112* 123*  BUN 12 10  CREATININE 0.97 1.01  CALCIUM 8.8* 9.1   Liver Function Tests:  Recent Labs Lab 07/03/15 2116  AST 30  ALT 22  ALKPHOS 106  BILITOT 0.9  PROT 7.8  ALBUMIN 3.4*   No results for  input(s): LIPASE, AMYLASE in the last 168 hours. No results for input(s): AMMONIA in the last 168 hours. CBC:  Recent Labs Lab 07/03/15 2116  07/06/15 0855 07/07/15 0546  WBC 12.2*  < > 7.9 9.1  NEUTROABS 9.5*  --   --   --   HGB 14.9  < > 11.6* 12.2*  HCT 44.7  < > 36.0* 37.6*  MCV 84.8  < > 86.3 86.0  PLT 492*  < > 332 367  < > = values in this interval not displayed. Cardiac Enzymes: No results for input(s): CKTOTAL, CKMB, CKMBINDEX, TROPONINI in the last 168 hours. BNP:  Invalid input(s): POCBNP CBG:  Recent Labs Lab 07/07/15 0805  GLUCAP 121*    Significant Diagnostic Studies:  Ct Abdomen Pelvis Wo Contrast  07/04/2015   CLINICAL DATA:  81 year old male with lower abdominal pain and no bowel movement  EXAM: CT ABDOMEN AND PELVIS WITHOUT CONTRAST  TECHNIQUE: Multidetector CT imaging of the abdomen and pelvis was performed following the standard protocol without IV contrast.  COMPARISON:  None.  FINDINGS: Evaluation of this exam is limited in the absence of intravenous contrast. Evaluation is also limited due to respiratory motion artifact. Small calcified right middle lobe granuloma. The visualized lung bases are clear. There is coronary vascular calcification.  No intra-abdominal free air or free fluid.  Small scattered hepatic hypodense lesions are not characterized but may represent a cyst or hemangioma. MRI may provide better characterization if clinically indicated. There are multiple layering stones within the gallbladder. No pericholecystic fluid or inflammatory changes identified. The pancreas, spleen, adrenal glands, kidneys caps appear unremarkable. There is a 3 mm calculus along the posterior wall of the urinary bladder at the right UVJ. The urinary bladder is decompressed around a suprapubic catheter. There is apparent thickening of the bladder wall with mild perivesical haziness. Correlation with urinalysis recommended to exclude cystitis. The prostate gland is enlarged  with mass effect and elevation of the base of the bladder.  Moderate amount of dense stool noted throughout the colon and rectosigmoid. There is a large dense stool ball within the rectum compatible with fecal impaction. There is colonic diverticulosis without active inflammation. No evidence of bowel obstruction. Normal appendix.  There is aortoiliac atherosclerotic disease. 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 Evaluation of the vasculature is limited in the absence of intravenous contrast. No portal venous gas identified. There is no lymphadenopathy.  Small fat containing umbilical hernia. Osteopenia with severe degenerative changes of the spine. There is loss of bone trabeculation involving the sacrum with areas of sclerosis this may be related to osteopenia or possible postradiation changes. A lytic neoplastic process is not excluded clinical correlation is recommended. MRI is recommended for further evaluation. There is grade 1 L4-5 anterolisthesis. No acute fracture.  IMPRESSION: Constipation with fecal impaction within the rectum. No evidence of bowel obstruction. Diverticulosis.  Cholelithiasis.  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  Lidocaine appearance of the sacrum possibly related to osteopenia for radiation. Correlation with clinical exam and MRI recommended.   Electronically Signed   By: Elgie Collard M.D.   On: 07/04/2015 00:21   Dg Chest 2 View  07/03/2015   CLINICAL DATA:  Hypoxia.  EXAM: CHEST  2 VIEW  COMPARISON:  01/19/2015  FINDINGS: Again seen elevation of left hemidiaphragm. Heart at the upper limits of normal in size, again seen tortuosity of the thoracic aorta. Pulmonary vasculature is normal. There is no consolidation, pleural effusion or pneumothorax. The bones are under mineralized without acute osseous abnormality.  IMPRESSION: No acute pulmonary process.    Electronically Signed   By: Rubye Oaks M.D.   On: 07/03/2015 21:54   Ct Head Wo Contrast  07/04/2015   CLINICAL DATA:  Altered mental status.  EXAM: CT HEAD WITHOUT CONTRAST  TECHNIQUE: Contiguous axial images were obtained from the base of the skull through the vertex without intravenous contrast.  COMPARISON:  CT head 05/30/2015, brain MRI 06/12/2015  FINDINGS: No intracranial hemorrhage, mass effect, or midline shift.  No hydrocephalus. The basilar cisterns are patent. No evidence of territorial infarct. No intracranial fluid collection. Unchanged cerebral and cerebellar atrophy. 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 Unchanged chronic small vessel ischemic change. Calvarium is intact. Included paranasal sinuses and mastoid air cells are well aerated.  IMPRESSION: Stable atrophy and chronic small vessel ischemic change throughout acute intracranial abnormality.   Electronically Signed   By: Rubye Oaks M.D.   On: 07/04/2015 00:11    2D ECHO:   Disposition and Follow-up: Discharge Instructions    Diet - low sodium heart healthy    Complete by:  As directed      Increase activity slowly    Complete by:  As directed             DISPOSITION: SNF  DISCHARGE FOLLOW-UP Follow-up Information    Follow up with  Duane Lope, MD. Schedule an appointment as soon as possible for a visit in 2 weeks.   Specialty:  Family Medicine   Why:  for hospital follow-up   Contact information:   [address] [place] [address] [phone]       Follow up with Anson Fret, MD. Schedule an appointment as soon as possible for a visit in 2 weeks.   Specialty:  Neurology   Why:  for hospital follow-up   Contact information:   [address] [place] [address] [phone]        Time spent on Discharge: 33 minutes  Signed:   RAI,RIPUDEEP M.D. [facility] 07/07/2015, 10:28 AM Pager: [phone]

## 2015-07-09 ENCOUNTER — Encounter: Payer: Self-pay | Admitting: Neurology

## 2015-07-09 ENCOUNTER — Telehealth: Payer: Self-pay | Admitting: Neurology

## 2015-07-09 ENCOUNTER — Non-Acute Institutional Stay (SKILLED_NURSING_FACILITY): Payer: Medicare Other | Admitting: Internal Medicine

## 2015-07-09 ENCOUNTER — Encounter: Payer: Self-pay | Admitting: Internal Medicine

## 2015-07-09 DIAGNOSIS — N179 Acute kidney failure, unspecified: Secondary | ICD-10-CM | POA: Diagnosis not present

## 2015-07-09 DIAGNOSIS — I480 Paroxysmal atrial fibrillation: Secondary | ICD-10-CM

## 2015-07-09 DIAGNOSIS — G9340 Encephalopathy, unspecified: Secondary | ICD-10-CM | POA: Diagnosis not present

## 2015-07-09 DIAGNOSIS — G7000 Myasthenia gravis without (acute) exacerbation: Secondary | ICD-10-CM | POA: Diagnosis not present

## 2015-07-09 DIAGNOSIS — E785 Hyperlipidemia, unspecified: Secondary | ICD-10-CM | POA: Diagnosis not present

## 2015-07-09 DIAGNOSIS — N183 Chronic kidney disease, stage 3 unspecified: Secondary | ICD-10-CM

## 2015-07-09 DIAGNOSIS — I10 Essential (primary) hypertension: Secondary | ICD-10-CM | POA: Diagnosis not present

## 2015-07-09 NOTE — Telephone Encounter (Signed)
I just want prednisone tablets , generic oral prednisone. Seth [name] can you call the pharmacy and see what went wrong with this order? Not sure why we would need prior auth. Was it ordered wrong? Not sure what deltasone is, I assume it is a brand name. But I only want generic prednisone oral .

## 2015-07-09 NOTE — Telephone Encounter (Signed)
Seth [name] - would you pleae call patient's daughter at Daughter contact: [phone] and let her know Dr. Allena Katz, neuromuscular expert, would be glad to see her father and provide additional guidance and advise on his weakness and possible myasthenia Gravis diagnosis. I have forwarded the referral and Dr. Allena Katz is aware. You can provide the [facility] number for her to call if she doesn't hear from them soon. Thank you!

## 2015-07-09 NOTE — Telephone Encounter (Signed)
Seth [name] called office. She stated prior authorization is needed for prednisone because it will only approve for 15 days. Since it is for more than 15 days, a prior authorization is needed. She stated we can do 15 day increments or go ahead with the authorization. I advised Dr. Lucia Gaskins is not in the office. I will give her the message and call her back. She verbalized understanding and stated she will fill the first 15 so the pt can get started on the prednisone.

## 2015-07-09 NOTE — Telephone Encounter (Signed)
Seth [name]/[facility] [phone] called regarding prior auth for predniSONE (DELTASONE) 10 MG tablet. Seth [name] re-ran this Rx and it is still needing prior auth.

## 2015-07-09 NOTE — Telephone Encounter (Signed)
Called and spoke w/ Chrissie Noa at [employer]. Advised that Dr. Lucia Gaskins is not in the office today. I received fax for prior authorization and will fax once she signs it. He verbalized understanding.

## 2015-07-09 NOTE — Telephone Encounter (Signed)
Tried calling back. Got busy signal. Will try again later.

## 2015-07-09 NOTE — Telephone Encounter (Signed)
Spoke w/ Raynelle Fanning from [facility]. She stated they actually needed diagnosis for which Rx prednisone was being used for to submit to either medicare plan B or D. I advised per Dr. Lucia Gaskins it is for myasthenia gravis. She verbalized understanding and said she will call back if she needs a paid claim. I told her I will save fax they sent over for prior authorization just in case. She is aware.

## 2015-07-09 NOTE — Telephone Encounter (Signed)
Called and spoke with daughter Seth [name], who is listed on DPR. She stated after pt was in our office last week Wednesday they went back [facility] and he was screaming and crying in pain. They ended up taking him to the ER. They found that his bowels were blocked. He also developed a UTI while in hospital. While in the hospital, he became very confused and disoriented. The doctor that saw him in the hospital advised family that he should not go back to assisted living. That he needs to go to a full skilled facility. They have moved him to [facility]: Address: [address] [place], [place] [address] Phone: [phone]  They moved him there this past Sunday. So far, he has had a rough transition. He is not responding to anyone or anything. Husband and her going to check on him today. She wanted to let Dr. Lucia Gaskins know what was going on. Advised that Dr. Lucia Gaskins is not in the office today, but she will be back tomorrow. She verbalized understanding.   Also told her Dr. Lucia Gaskins spoke with Dr. Allena Katz, the neuromuscular expert, who would be glad to see their father and provide additional guidance and advice on his weakness and possible myasthenia gravis diagnosis. Dr. Lucia Gaskins forwarded referral and Dr. Allena Katz is aware. Gave her [facility] phone number to call if she does not hear from them soon: [phone]. She verbalized understanding.

## 2015-07-09 NOTE — Progress Notes (Signed)
MRN: [id number] Name: Seth [name]  Sex: male Age: 81 y.o. DOB: 1925/05/29  PSC #: [facility] Facility/Room:111 Level Of Care: SNF Cheryln Balcom: Merrilee Seashore D Emergency Contacts: Extended Emergency Contact Information Primary Emergency Contact: P[place]co,David & Neoma [place] Home Phone: [phone] Mobile Phone: [phone] Relation: Son  Code Status:   Allergies: Ceftin  Chief Complaint  Patient presents with  . New Admit To SNF    HPI: Patient is 81 y.o. male with history of Recently diagnosed Myastenia Gravis, AS, Atrial fibrillaltion HTN and Hyperlipidemia male resident of the [facility] sent to the ED for ABD Pain and confusion x 2 days and poor intake of foods and liquids. His family felt he was not acting right. One Week ago, prior to admission, he had his supra-pubic catheter replaced, and the foley catheter he had in placed was discontinued 1 day ago. His family was concerned that he may have a UTI. He has had increased confusion for the past 3 weeks. EMS reported findings of hypoxia with o2 sats in the 80's on RA. He was placed on 4 liters of NCO2 on route.In ED, CT of the head showed no acute findings. Ct scan of the ABD /Pelvis reveal a fecal Impaction so he was disempacted in the ED and an enema was ordered. A UA was performed and sent for C+S. Pt was admitted from 9/14-18 to finish work-up and treatment of pt. Hospital course was complicated by ARF and prolonged encephalopathy. Pt is admitted to SNF for generalized weaknessfor OT/PT. While at SNF pt will be followed for HTN, tx with metoprolol, A fib, tx with metoptolol and eliquis and HLD tx with simvastatin.  Past Medical History  Diagnosis Date  . Hypertension   . Hyperlipidemia   . Aortic stenosis   . High cholesterol   . A-fib   . History of TIAs   . Multiple falls   . Urinary incontinence   . Dementia     short term memory loss    Past Surgical History  Procedure  Laterality Date  . Hernia repair      1970s  . Back surgery  2006  . Cervical laminectomy  2002 ?  [name] Insertion of suprapubic catheter N/A 06/26/2015    Procedure: INSERTION OF SUPRAPUBIC CATHETER;  Surgeon: Malen Gauze, MD;  Location: [facility];  Service: Urology;  Laterality: N/A;  . Cystoscopy N/A 06/26/2015    Procedure: CYSTOSCOPY;  Surgeon: Malen Gauze, MD;  Location: [facility];  Service: Urology;  Laterality: N/A;      Medication List       This list is accurate as of: 07/09/15 11:59 PM.  Always use your most recent med list.               acetaminophen 500 MG tablet  Commonly known as:  TYLENOL  Take 500 mg by mouth every 6 (six) hours as needed for mild pain. NOT on nursing home MAR     cetaphil cream  Apply 1 application topically as needed (for feet).     docusate sodium 100 MG capsule  Commonly known as:  COLACE  Take 1 capsule (100 mg total) by mouth 2 (two) times daily.     ELIQUIS 2.5 MG Tabs tablet  Generic drug:  apixaban  Take 2.5 mg by mouth 2 (two) times daily.     feeding supplement (ENSURE ENLIVE) Liqd  Take 237 mLs by mouth 2 (two) times daily between meals.  metoprolol succinate 50 MG 24 hr tablet  Commonly known as:  TOPROL-XL  Take 50 mg by mouth every morning. Take with or immediately following a meal.     mirtazapine 7.5 MG tablet  Commonly known as:  REMERON  Take 7.5 mg by mouth at bedtime.     multivitamin with minerals Tabs tablet  Take 1 tablet by mouth daily.     MYRBETRIQ 25 MG Tb24 tablet  Generic drug:  mirabegron ER  Take 25 mg by mouth daily.     polyethylene glycol packet  Commonly known as:  MIRALAX / GLYCOLAX  Take 17 g by mouth daily as needed for moderate constipation.     predniSONE 10 MG tablet  Commonly known as:  DELTASONE  Take 1 tablet (10 mg total) by mouth daily with breakfast.     pyridostigmine 60 MG tablet  Commonly known as:  MESTINON  Take 1 tablet (60 mg total) by mouth 3 (three) times  daily.     senna 8.6 MG Tabs tablet  Commonly known as:  SENOKOT  Take 1 tablet by mouth at bedtime.     simvastatin 10 MG tablet  Commonly known as:  ZOCOR  Take 10 mg by mouth daily.     traMADol 50 MG tablet  Commonly known as:  ULTRAM  Take 1 tablet (50 mg total) by mouth every 6 (six) hours as needed for moderate pain.     Vitamin D3 2000 UNITS Tabs  Take 2,000 Units by mouth daily.        No orders of the defined types were placed in this encounter.    Immunization History  Administered Date(s) Administered  . Influenza,inj,Quad PF,36+ Mos 07/05/2015  . Pneumococcal Polysaccharide-23 07/05/2015  . Tdap 03/31/2015    Social History  Substance Use Topics  . Smoking status: Former Games developer  . Smokeless tobacco: Never Used  . Alcohol Use: No    Family history is + prostate CA    Review of Systems  DATA OBTAINED: from patient, nurse, medical record,son and daughter. GENERAL:  no fevers, fatigue, appetite changes; today c/o pt won't wake up SKIN: No itching, rash or wounds EYES: No eye pain, redness, discharge EARS: No earache, tinnitus, change in hearing NOSE: No congestion, drainage or bleeding  MOUTH/THROAT: No mouth or tooth pain, No sore throat RESPIRATORY: No cough, wheezing, SOB CARDIAC: No chest pain, palpitations, lower extremity edema  GI: No abdominal pain, No N/V/D or constipation, No heartburn or reflux  GU: No dysuria, frequency or urgency, or incontinence  MUSCULOSKELETAL: No unrelieved bone/joint pain NEUROLOGIC: No headache, dizziness or focal weakness PSYCHIATRIC: No c/o anxiety or sadness   Filed Vitals:   07/09/15 1253  BP: 103/65  Pulse: 80  Temp: 97 F (36.1 C)  Resp: 20    SpO2 Readings from Last 1 Encounters:  07/07/15 97%        Physical Exam  GENERAL APPEARANCE: somnalent , but conversant,  No acute distress.  SKIN: No diaphoresis rash HEAD: Normocephalic, atraumatic  EYES: Conjunctiva/lids clear. Pupils round,  reactive. EOMs intact.  EARS: External exam WNL, canals clear. Hearing grossly normal.  NOSE: No deformity or discharge.  MOUTH/THROAT: Lips w/o lesions  RESPIRATORY: Breathing is even, unlabored. Lung sounds are clear   CARDIOVASCULAR: Heart RRR no murmurs, rubs or gallops. No peripheral edema.   GASTROINTESTINAL: Abdomen is soft, non-tender, not distended w/ normal bowel sounds. GENITOURINARY: Bladder non tender, not distended  MUSCULOSKELETAL: No abnormal joints or musculature NEUROLOGIC:  Cranial nerves 2-12 grossly intact. Moves all extremities; pt answers all questions appropriately; says he is sleeping, says his upper back hurts, says the m=assage with my hand feels good, all with his eyes closed. As soon as his family left he "woke up" , sat up in bed and began interacting with staff PSYCHIATRIC: Mood and affect appropriate to situation now with dementia, some behavoiral issues managable   Patient Active Problem List   Diagnosis Date Noted  . Acute encephalopathy 07/04/2015  . Fecal impaction 07/04/2015  . Dementia 07/04/2015  . Pressure ulcer 07/04/2015  . AP (abdominal pain)   . Hypoxia   . Altered mental status   . Consciousness loss of 05/29/2015  . Vertigo, central origin 05/29/2015  . Dizziness and giddiness 05/29/2015  . Weakness 05/29/2015  . Cerebral brain hemorrhage 05/29/2015  . TIA (transient ischemic attack) 05/29/2015  . Proximal limb muscle weakness 05/29/2015  . Ataxia 05/29/2015  . Syncope 03/31/2015  . CKD (chronic kidney disease), stage III 03/31/2015  . HTN (hypertension) 03/31/2015  . Atrial fibrillation 03/31/2015  . Dyslipidemia 03/31/2015    CBC    Component Value Date/Time   WBC 9.1 07/07/2015 0546   RBC 4.37 07/07/2015 0546   HGB 12.2* 07/07/2015 0546   HCT 37.6* 07/07/2015 0546   PLT 367 07/07/2015 0546   MCV 86.0 07/07/2015 0546   LYMPHSABS 1.6 07/03/2015 2116   MONOABS 1.0 07/03/2015 2116   EOSABS 0.1 07/03/2015 2116   BASOSABS 0.0  07/03/2015 2116    CMP     Component Value Date/Time   NA 137 07/07/2015 0546   NA 140 05/29/2015 1319   K 3.9 07/07/2015 0546   CL 104 07/07/2015 0546   CO2 26 07/07/2015 0546   GLUCOSE 123* 07/07/2015 0546   GLUCOSE 152* 05/29/2015 1319   BUN 10 07/07/2015 0546   BUN 22 05/29/2015 1319   CREATININE 1.01 07/07/2015 0546   CALCIUM 9.1 07/07/2015 0546   PROT 7.8 07/03/2015 2116   PROT 7.7 05/29/2015 1319   ALBUMIN 3.4* 07/03/2015 2116   AST 30 07/03/2015 2116   ALT 22 07/03/2015 2116   ALKPHOS 106 07/03/2015 2116   BILITOT 0.9 07/03/2015 2116   BILITOT 1.2 05/29/2015 1319   GFRNONAA >60 07/07/2015 0546   GFRAA >60 07/07/2015 0546    No results found for: HGBA1C   Ct Abdomen Pelvis Wo Contrast  07/04/2015   CLINICAL DATA:  81 year old male with lower abdominal pain and no bowel movement  EXAM: CT ABDOMEN AND PELVIS WITHOUT CONTRAST  TECHNIQUE: Multidetector CT imaging of the abdomen and pelvis was performed following the standard protocol without IV contrast.  COMPARISON:  None.  FINDINGS: Evaluation of this exam is limited in the absence of intravenous contrast. Evaluation is also limited due to respiratory motion artifact. Small calcified right middle lobe granuloma. The visualized lung bases are clear. There is coronary vascular calcification.  No intra-abdominal free air or free fluid.  Small scattered hepatic hypodense lesions are not characterized but may represent a cyst or hemangioma. MRI may provide better characterization if clinically indicated. There are multiple layering stones within the gallbladder. No pericholecystic fluid or inflammatory changes identified. The pancreas, spleen, adrenal glands, kidneys caps appear unremarkable. There is a 3 mm calculus along the posterior wall of the urinary bladder at the right UVJ. The urinary bladder is decompressed around a suprapubic catheter. There is apparent thickening of the bladder wall with mild perivesical haziness.  Correlation with urinalysis recommended to exclude  cystitis. The prostate gland is enlarged with mass effect and elevation of the base of the bladder.  Moderate amount of dense stool noted throughout the colon and rectosigmoid. There is a large dense stool ball within the rectum compatible with fecal impaction. There is colonic diverticulosis without active inflammation. No evidence of bowel obstruction. Normal appendix.  There is aortoiliac atherosclerotic disease. 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 Evaluation of the vasculature is limited in the absence of intravenous contrast. No portal venous gas identified. There is no lymphadenopathy.  Small fat containing umbilical hernia. Osteopenia with severe degenerative changes of the spine. There is loss of bone trabeculation involving the sacrum with areas of sclerosis this may be related to osteopenia or possible postradiation changes. A lytic neoplastic process is not excluded clinical correlation is recommended. MRI is recommended for further evaluation. There is grade 1 L4-5 anterolisthesis. No acute fracture.  IMPRESSION: Constipation with fecal impaction within the rectum. No evidence of bowel obstruction. Diverticulosis.  Cholelithiasis.  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  Lidocaine appearance of the sacrum possibly related to osteopenia for radiation. Correlation with clinical exam and MRI recommended.   Electronically Signed   By: Elgie Collard M.D.   On: 07/04/2015 00:21   Dg Chest 2 View  07/03/2015   CLINICAL DATA:  Hypoxia.  EXAM: CHEST  2 VIEW  COMPARISON:  01/19/2015  FINDINGS: Again seen elevation of left hemidiaphragm. Heart at the upper limits of normal in size, again seen tortuosity of the thoracic aorta. Pulmonary vasculature is normal. There is no consolidation, pleural effusion or pneumothorax. The bones are under  mineralized without acute osseous abnormality.  IMPRESSION: No acute pulmonary process.   Electronically Signed   By: Rubye Oaks M.D.   On: 07/03/2015 21:54   Ct Head Wo Contrast  07/04/2015   CLINICAL DATA:  Altered mental status.  EXAM: CT HEAD WITHOUT CONTRAST  TECHNIQUE: Contiguous axial images were obtained from the base of the skull through the vertex without intravenous contrast.  COMPARISON:  CT head 05/30/2015, brain MRI 06/12/2015  FINDINGS: No intracranial hemorrhage, mass effect, or midline shift. No hydrocephalus. The basilar cisterns are patent. No evidence of territorial infarct. No intracranial fluid collection. Unchanged cerebral and cerebellar atrophy. 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 Unchanged chronic small vessel ischemic change. Calvarium is intact. Included paranasal sinuses and mastoid air cells are well aerated.  IMPRESSION: Stable atrophy and chronic small vessel ischemic change throughout acute intracranial abnormality.   Electronically Signed   By: Rubye Oaks M.D.   On: 07/04/2015 00:11    Not all labs, radiology exams or other studies done during hospitalization come through on my EPIC note; however they are reviewed by me.    Assessment and Plan  No problem-specific assessment & plan notes found for this encounter.  Time spent > 45 min - initial eval prolonged 2/2 pt's somnakence Margit Hanks, MD

## 2015-07-10 ENCOUNTER — Telehealth: Payer: Self-pay | Admitting: Neurology

## 2015-07-10 NOTE — Telephone Encounter (Signed)
Tried [facility] back at [phone]. Got hung up on. Tried calling again and spoke with Olu. She stated Ardell Isaacs has left for the day and she will call me back tomorrow to discuss setting up appt. Tried to advise that Dr. Lucia Gaskins wants him to f/u with his PCP. She responded with, "So Dr. Lucia Gaskins won't see him?" I clarified that Dr. Lucia Gaskins is always happy to see him, but she would like him to f/u with his PCP. She stated Ardell Isaacs was calling to set up appt with her because they wanted a f/u with the neurologist. I asked for her to have Tanza call me back tomorrow so we can clarify. She verbalized understanding.

## 2015-07-10 NOTE — Telephone Encounter (Signed)
Spoke w/ Dr. Lucia Gaskins and he needs to f/u with primary care and not her. Also referred him to another neurologist per Dr. Lucia Gaskins.

## 2015-07-10 NOTE — Telephone Encounter (Signed)
Phone call from [facility] [phone], requesting 2 week hospital f/u appt for patient. Transportation can't do early morning slots, doesn't transport after 3:00pm. Only slots available are New Patient Slots. Appointment scheduled for 07/25/15 1:15 in a time slot (feel like it needs to be 30 mins). Please advise if ok or do you want to adjust into part of a new patient slot to make 30 minutes?

## 2015-07-11 NOTE — Telephone Encounter (Signed)
Called and spoke w/ Seth [name] and informed her that Dr. Lucia Gaskins would like her to f/u with primary care and not her. She stated that the hospital discharge instructions stated to f/u with his neurologist. They are acting as primary care until pt is discharged from [facility]. Then he will f/u with primary care. He has appt scheduled for 07/25/15 for 30 minutes. They know to check in at 12:50pm for 1:00pm appt. I told her I will let Dr. Lucia Gaskins know. She verbalized understanding.

## 2015-07-12 NOTE — Telephone Encounter (Signed)
Thanks

## 2015-07-13 ENCOUNTER — Encounter: Payer: Self-pay | Admitting: Internal Medicine

## 2015-07-13 DIAGNOSIS — G7000 Myasthenia gravis without (acute) exacerbation: Secondary | ICD-10-CM | POA: Insufficient documentation

## 2015-07-13 DIAGNOSIS — N179 Acute kidney failure, unspecified: Secondary | ICD-10-CM | POA: Insufficient documentation

## 2015-07-13 DIAGNOSIS — N183 Chronic kidney disease, stage 3 (moderate): Secondary | ICD-10-CM

## 2015-07-13 NOTE — Assessment & Plan Note (Signed)
Patient was placed on gentle hydration. urine culture showed only 30K colonies - Patient has memory issues and sundowning noticed by the family in the last few months which has been progressively getting worse. I reviewed the MRI of the brain done on 8/24, CT head on 9/15 with the family, patient has cerebral atrophy with severe ventriculomegaly which explains his dementia issues and worsening cognitive impairment.  -TSH on 8/10 was 0.8  SNF - supportive care, OT/PT

## 2015-07-13 NOTE — Assessment & Plan Note (Signed)
Creatinine 1.3 at the time of admission, improved 1.0.SNF - will monitor with BMP

## 2015-07-13 NOTE — Assessment & Plan Note (Signed)
SNF - cont metoprolol for rate and eliquis for prophylaxis

## 2015-07-13 NOTE — Assessment & Plan Note (Signed)
LFT's normal ;SNF - cont zocor 10 mg daily

## 2015-07-13 NOTE — Assessment & Plan Note (Signed)
SNF - Continue pyridostigmine, prednisone, mirabregon. Follow up outpatient with neurology, Dr. Lucia Gaskins in 2-3 weeks.

## 2015-07-13 NOTE — Assessment & Plan Note (Signed)
SNF - stable on metoprolol

## 2015-07-24 ENCOUNTER — Telehealth: Payer: Self-pay | Admitting: *Deleted

## 2015-07-24 NOTE — Telephone Encounter (Signed)
Scheduled appt for 10/10 at 300pm for check in at 245pm. Offered 4pm slots, but she declined as well because transportation would not get back in time. They are done at 5pm.

## 2015-07-24 NOTE — Telephone Encounter (Signed)
[facility] [phone] called to reschedule follow up appt from previously cancelled 07/25/15. I offered a couple of different times but either transportation doesn't get in until 8:00am to begin transport or they were already scheduled to transport another patient on day I offered. Please call to schedule sooner than what I could offer in times/days they can transport.

## 2015-07-25 ENCOUNTER — Ambulatory Visit: Payer: Self-pay | Admitting: Neurology

## 2015-07-29 ENCOUNTER — Ambulatory Visit: Payer: Self-pay | Admitting: Neurology

## 2015-08-02 ENCOUNTER — Inpatient Hospital Stay (HOSPITAL_COMMUNITY)
Admission: EM | Admit: 2015-08-02 | Discharge: 2015-08-07 | DRG: 698 | Disposition: A | Discharge status: Skilled Nursing Facility | Payer: Medicare Other | Attending: Internal Medicine | Admitting: Internal Medicine

## 2015-08-02 ENCOUNTER — Encounter (HOSPITAL_COMMUNITY): Payer: Self-pay | Admitting: Emergency Medicine

## 2015-08-02 ENCOUNTER — Emergency Department (HOSPITAL_COMMUNITY): Payer: Medicare Other

## 2015-08-02 DIAGNOSIS — N493 Fournier gangrene: Secondary | ICD-10-CM | POA: Diagnosis present

## 2015-08-02 DIAGNOSIS — E46 Unspecified protein-calorie malnutrition: Secondary | ICD-10-CM | POA: Diagnosis present

## 2015-08-02 DIAGNOSIS — T83518A Infection and inflammatory reaction due to other urinary catheter, initial encounter: Principal | ICD-10-CM | POA: Diagnosis present

## 2015-08-02 DIAGNOSIS — G7000 Myasthenia gravis without (acute) exacerbation: Secondary | ICD-10-CM | POA: Diagnosis not present

## 2015-08-02 DIAGNOSIS — R319 Hematuria, unspecified: Secondary | ICD-10-CM | POA: Diagnosis not present

## 2015-08-02 DIAGNOSIS — Z8673 Personal history of transient ischemic attack (TIA), and cerebral infarction without residual deficits: Secondary | ICD-10-CM

## 2015-08-02 DIAGNOSIS — I350 Nonrheumatic aortic (valve) stenosis: Secondary | ICD-10-CM | POA: Diagnosis present

## 2015-08-02 DIAGNOSIS — E7800 Pure hypercholesterolemia, unspecified: Secondary | ICD-10-CM | POA: Diagnosis present

## 2015-08-02 DIAGNOSIS — A4159 Other Gram-negative sepsis: Secondary | ICD-10-CM | POA: Diagnosis present

## 2015-08-02 DIAGNOSIS — Z87440 Personal history of urinary (tract) infections: Secondary | ICD-10-CM

## 2015-08-02 DIAGNOSIS — Z79899 Other long term (current) drug therapy: Secondary | ICD-10-CM

## 2015-08-02 DIAGNOSIS — I4891 Unspecified atrial fibrillation: Secondary | ICD-10-CM | POA: Diagnosis not present

## 2015-08-02 DIAGNOSIS — N183 Chronic kidney disease, stage 3 unspecified: Secondary | ICD-10-CM | POA: Diagnosis present

## 2015-08-02 DIAGNOSIS — I129 Hypertensive chronic kidney disease with stage 1 through stage 4 chronic kidney disease, or unspecified chronic kidney disease: Secondary | ICD-10-CM | POA: Diagnosis present

## 2015-08-02 DIAGNOSIS — F0390 Unspecified dementia without behavioral disturbance: Secondary | ICD-10-CM | POA: Diagnosis present

## 2015-08-02 DIAGNOSIS — I959 Hypotension, unspecified: Secondary | ICD-10-CM | POA: Diagnosis present

## 2015-08-02 DIAGNOSIS — Z881 Allergy status to other antibiotic agents status: Secondary | ICD-10-CM

## 2015-08-02 DIAGNOSIS — Z87891 Personal history of nicotine dependence: Secondary | ICD-10-CM

## 2015-08-02 DIAGNOSIS — N390 Urinary tract infection, site not specified: Secondary | ICD-10-CM | POA: Diagnosis present

## 2015-08-02 DIAGNOSIS — R509 Fever, unspecified: Secondary | ICD-10-CM | POA: Diagnosis not present

## 2015-08-02 DIAGNOSIS — A419 Sepsis, unspecified organism: Secondary | ICD-10-CM | POA: Diagnosis present

## 2015-08-02 DIAGNOSIS — D72829 Elevated white blood cell count, unspecified: Secondary | ICD-10-CM | POA: Diagnosis present

## 2015-08-02 DIAGNOSIS — R6521 Severe sepsis with septic shock: Secondary | ICD-10-CM | POA: Diagnosis not present

## 2015-08-02 DIAGNOSIS — Z515 Encounter for palliative care: Secondary | ICD-10-CM | POA: Diagnosis present

## 2015-08-02 DIAGNOSIS — B961 Klebsiella pneumoniae [K. pneumoniae] as the cause of diseases classified elsewhere: Secondary | ICD-10-CM | POA: Diagnosis present

## 2015-08-02 DIAGNOSIS — R7881 Bacteremia: Secondary | ICD-10-CM | POA: Diagnosis present

## 2015-08-02 DIAGNOSIS — Z7901 Long term (current) use of anticoagulants: Secondary | ICD-10-CM

## 2015-08-02 DIAGNOSIS — Z8042 Family history of malignant neoplasm of prostate: Secondary | ICD-10-CM

## 2015-08-02 DIAGNOSIS — Y838 Other surgical procedures as the cause of abnormal reaction of the patient, or of later complication, without mention of misadventure at the time of the procedure: Secondary | ICD-10-CM | POA: Diagnosis present

## 2015-08-02 DIAGNOSIS — E785 Hyperlipidemia, unspecified: Secondary | ICD-10-CM | POA: Diagnosis present

## 2015-08-02 DIAGNOSIS — Z66 Do not resuscitate: Secondary | ICD-10-CM | POA: Diagnosis present

## 2015-08-02 DIAGNOSIS — T83511A Infection and inflammatory reaction due to indwelling urethral catheter, initial encounter: Secondary | ICD-10-CM

## 2015-08-02 DIAGNOSIS — A414 Sepsis due to anaerobes: Secondary | ICD-10-CM | POA: Diagnosis present

## 2015-08-02 DIAGNOSIS — R05 Cough: Secondary | ICD-10-CM | POA: Diagnosis not present

## 2015-08-02 LAB — CBC WITH DIFFERENTIAL/PLATELET
BASOS PCT: 0 %
Basophils Absolute: 0 10*3/uL (ref 0.0–0.1)
Eosinophils Absolute: 0.1 10*3/uL (ref 0.0–0.7)
Eosinophils Relative: 0 %
HEMATOCRIT: 39.9 % (ref 39.0–52.0)
Hemoglobin: 13.4 g/dL (ref 13.0–17.0)
Lymphocytes Relative: 2 %
Lymphs Abs: 0.3 10*3/uL — ABNORMAL LOW (ref 0.7–4.0)
MCH: 28.9 pg (ref 26.0–34.0)
MCHC: 33.6 g/dL (ref 30.0–36.0)
MCV: 86 fL (ref 78.0–100.0)
MONO ABS: 0.2 10*3/uL (ref 0.1–1.0)
MONOS PCT: 1 %
NEUTROS ABS: 16 10*3/uL — AB (ref 1.7–7.7)
Neutrophils Relative %: 97 %
Platelets: 365 10*3/uL (ref 150–400)
RBC: 4.64 MIL/uL (ref 4.22–5.81)
RDW: 16.6 % — AB (ref 11.5–15.5)
WBC: 16.5 10*3/uL — ABNORMAL HIGH (ref 4.0–10.5)

## 2015-08-02 LAB — COMPREHENSIVE METABOLIC PANEL
ALBUMIN: 2.9 g/dL — AB (ref 3.5–5.0)
ALT: 23 U/L (ref 17–63)
ANION GAP: 12 (ref 5–15)
AST: 44 U/L — ABNORMAL HIGH (ref 15–41)
Alkaline Phosphatase: 87 U/L (ref 38–126)
BILIRUBIN TOTAL: 0.5 mg/dL (ref 0.3–1.2)
BUN: 23 mg/dL — ABNORMAL HIGH (ref 6–20)
CO2: 23 mmol/L (ref 22–32)
Calcium: 8.9 mg/dL (ref 8.9–10.3)
Chloride: 106 mmol/L (ref 101–111)
Creatinine, Ser: 1.37 mg/dL — ABNORMAL HIGH (ref 0.61–1.24)
GFR calc Af Amer: 51 mL/min — ABNORMAL LOW (ref >60)
GFR, EST NON AFRICAN AMERICAN: 44 mL/min — AB (ref >60)
Glucose, Bld: 206 mg/dL — ABNORMAL HIGH (ref 65–99)
POTASSIUM: 3.6 mmol/L (ref 3.5–5.1)
Sodium: 141 mmol/L (ref 135–145)
TOTAL PROTEIN: 6.3 g/dL — AB (ref 6.5–8.1)

## 2015-08-02 LAB — TYPE AND SCREEN
ABO/RH(D): A POS
ANTIBODY SCREEN: NEGATIVE

## 2015-08-02 LAB — PROTIME-INR
INR: 1.17 (ref 0.00–1.49)
PROTHROMBIN TIME: 15.1 s (ref 11.6–15.2)

## 2015-08-02 LAB — ABO/RH: ABO/RH(D): A POS

## 2015-08-02 LAB — LACTIC ACID, PLASMA: Lactic Acid, Venous: 4.4 mmol/L (ref 0.5–2.0)

## 2015-08-02 MED ORDER — VANCOMYCIN HCL 10 G IV SOLR
1500.0000 mg | Freq: Once | INTRAVENOUS | Status: AC
  Administered 2015-08-02: 1500 mg via INTRAVENOUS
  Filled 2015-08-02: qty 1500

## 2015-08-02 MED ORDER — ACETAMINOPHEN 325 MG PO TABS
650.0000 mg | ORAL_TABLET | Freq: Once | ORAL | Status: AC | PRN
  Administered 2015-08-02: 650 mg via ORAL
  Filled 2015-08-02: qty 2

## 2015-08-02 MED ORDER — SODIUM CHLORIDE 0.9 % IV BOLUS (SEPSIS)
2000.0000 mL | Freq: Once | INTRAVENOUS | Status: AC
  Administered 2015-08-02: 2000 mL via INTRAVENOUS

## 2015-08-02 MED ORDER — PIPERACILLIN-TAZOBACTAM 3.375 G IVPB 30 MIN
3.3750 g | Freq: Once | INTRAVENOUS | Status: AC
  Administered 2015-08-02: 3.375 g via INTRAVENOUS
  Filled 2015-08-02: qty 50

## 2015-08-02 NOTE — ED Notes (Signed)
Per EMS, pt is from [facility]. Pt's suprapubic catheter was changed today, and afterwards, the nurse noted blood clots coming from the catheter. Pt also had 2 episodes of emesis at the facility. Upon arrival to the ER, pt had a large amount of blood and blood clots in his brief. The blood appeared to be coming from his penis. Pt was in afib with EMS, with hx. Pt received 4mg  Zofran en route. Pt has a mass on his bladder. Facility also reports that the pt has had multipple UTIs of late.

## 2015-08-02 NOTE — ED Provider Notes (Addendum)
CSN: [id number]     Arrival date & time 08/02/15  2026 History   First MD Initiated Contact with Patient 08/02/15 2036     Chief Complaint  Patient presents with  . Fever  . Emesis  . Suprapubic catheter clogged       HPI Patient presents from the nursing home with blood and blood clots coming from the suprapubic catheter after suprapubic exchange today.  Suprapubic was placed proximal to 6 weeks ago by Dr. Ferd HibbsMcKinsey for chronic Foley catheter management given recurrent urinary tract infections.  He is on chronic anticoagulation for atrial fibrillation.  He developed nausea and vomiting today.  The facility reports that he has some sort of mass on his bladder.  Family reports change in his urine color followed by additional sediment noted over the past several days.  No reported fevers at home.  Found to have a fever 102.6 on arrival to the emergency department.  Patient is a DO NOT RESUSCITATE.   Past Medical History  Diagnosis Date  . Hypertension   . Hyperlipidemia   . Aortic stenosis   . High cholesterol   . A-fib (HCC)   . History of TIAs   . Multiple falls   . Urinary incontinence   . Dementia     short term memory loss   Past Surgical History  Procedure Laterality Date  . Hernia repair      1970s  . Back surgery  2006  . Cervical laminectomy  2002 ?  [name]. Insertion of suprapubic catheter N/A 06/26/2015    Procedure: INSERTION OF SUPRAPUBIC CATHETER;  Surgeon: Malen GauzePatrick L McKenzie, MD;  Location: [facility];  Service: Urology;  Laterality: N/A;  . Cystoscopy N/A 06/26/2015    Procedure: CYSTOSCOPY;  Surgeon: Malen GauzePatrick L McKenzie, MD;  Location: [facility];  Service: Urology;  Laterality: N/A;   Family History  Problem Relation Age of Onset  . Prostate cancer Brother    Social History  Substance Use Topics  . Smoking status: Former Games developermoker  . Smokeless tobacco: Never Used  . Alcohol Use: No    Review of Systems  All other systems reviewed and are negative.     Allergies   Ceftin  Home Medications   Prior to Admission medications   Medication Sig Start Date End Date Taking? Authorizing Provider  acetaminophen (TYLENOL) 500 MG tablet Take 500 mg by mouth every 6 (six) hours as needed for mild pain. [facility]    Historical Provider, MD  apixaban (ELIQUIS) 2.5 MG TABS tablet Take 2.5 mg by mouth 2 (two) times daily.    Historical Provider, MD  Cholecalciferol (VITAMIN D3) 2000 UNITS TABS Take 2,000 Units by mouth daily.     Historical Provider, MD  docusate sodium (COLACE) 100 MG capsule Take 1 capsule (100 mg total) by mouth 2 (two) times daily. 07/07/15   Ripudeep Jenna LuoK Rai, MD  Emollient (CETAPHIL) cream Apply 1 application topically as needed (for feet).    Historical Provider, MD  feeding supplement, ENSURE ENLIVE, (ENSURE ENLIVE) LIQD Take 237 mLs by mouth 2 (two) times daily between meals. 07/07/15   Ripudeep Jenna LuoK Rai, MD  metoprolol succinate (TOPROL-XL) 50 MG 24 hr tablet Take 50 mg by mouth every morning. Take with or immediately following a meal.    Historical Provider, MD  mirtazapine (REMERON) 7.5 MG tablet Take 7.5 mg by mouth at bedtime.    Historical Provider, MD  Multiple Vitamin (MULTIVITAMIN WITH MINERALS) TABS tablet Take 1  tablet by mouth daily.    Historical Provider, MD  MYRBETRIQ 25 MG TB24 tablet Take 25 mg by mouth daily. 06/29/15   Historical Provider, MD  polyethylene glycol (MIRALAX / GLYCOLAX) packet Take 17 g by mouth daily as needed for moderate constipation. 07/07/15   Ripudeep Jenna Luo, MD  predniSONE (DELTASONE) 10 MG tablet Take 1 tablet (10 mg total) by mouth daily with breakfast. 07/03/15   Anson Fret, MD  pyridostigmine (MESTINON) 60 MG tablet Take 1 tablet (60 mg total) by mouth 3 (three) times daily. Patient taking differently: Take 30 mg by mouth 3 (three) times daily.  07/03/15   Anson Fret, MD  senna (SENOKOT) 8.6 MG TABS tablet Take 1 tablet by mouth at bedtime.    Historical Provider, MD  simvastatin (ZOCOR) 10  MG tablet Take 10 mg by mouth daily.     Historical Provider, MD  traMADol (ULTRAM) 50 MG tablet Take 1 tablet (50 mg total) by mouth every 6 (six) hours as needed for moderate pain. 07/07/15   Ripudeep Jenna Luo, MD   BP 121/69 mmHg  Temp(Src) 100 F (37.8 C) (Oral)  Resp 36  Ht  (1.778 m)  Wt 158 lb (71.668 kg)  BMI 22.67 kg/m2  SpO2 90% Physical Exam  Constitutional: He is oriented to person, place, and time. He appears well-developed and well-nourished.  HENT:  Head: Normocephalic and atraumatic.  Eyes: EOM are normal.  Neck: Normal range of motion.  Cardiovascular: Normal rate, regular rhythm, normal heart sounds and intact distal pulses.   Pulmonary/Chest: Effort normal and breath sounds normal. No respiratory distress.  Abdominal: Soft. He exhibits no distension. There is no tenderness.  Genitourinary:  Suprapubic catheter in place with some urine leaking around it.  No surrounding signs of cellulitis.  No scrotal swelling or tenderness.  Some mild tenderness without erythema of the perineum.  No obvious crepitus of the perineum.  Small amount of blood noted at the urethral meatus  Musculoskeletal: Normal range of motion.  Neurological: He is alert and oriented to person, place, and time.  Skin: Skin is warm and dry.  Psychiatric: He has a normal mood and affect. Judgment normal.  Nursing note and vitals reviewed.   ED Course  SUPRAPUBIC TUBE PLACEMENT Performed by: Azalia Bilis Authorized by: Azalia Bilis Consent: Verbal consent obtained. Consent given by: power of attorney Patient identity confirmed: verbally with patient and hospital-assigned identification number Time out: Immediately prior to procedure a "time out" was called to verify the correct patient, procedure, equipment, support staff and site/side marked as required. Indications comment: suprapubic catheter exchange Local anesthesia used: no Patient sedated: no Suprapubic aspiration by:  catheter Catheter type: Foley Catheter size: 16 Fr Number of attempts: 1 Patient tolerance: Patient tolerated the procedure well with no immediate complications     CRITICAL CARE Performed by: Lyanne Co Total critical care time: 35 Critical care time was exclusive of separately billable procedures and treating other patients. Critical care was necessary to treat or prevent imminent or life-threatening deterioration. Critical care was time spent personally by me on the following activities: development of treatment plan with patient and/or surrogate as well as nursing, discussions with consultants, evaluation of patient's response to treatment, examination of patient, obtaining history from patient or surrogate, ordering and performing treatments and interventions, ordering and review of laboratory studies, ordering and review of radiographic studies, pulse oximetry and re-evaluation of patient's condition.     Labs Review Labs Reviewed  CBC  WITH DIFFERENTIAL/PLATELET - Abnormal; Notable for the following:    WBC 16.5 (*)    RDW 16.6 (*)    Neutro Abs 16.0 (*)    Lymphs Abs 0.3 (*)    All other components within normal limits  COMPREHENSIVE METABOLIC PANEL - Abnormal; Notable for the following:    Glucose, Bld 206 (*)    BUN 23 (*)    Creatinine, Ser 1.37 (*)    Total Protein 6.3 (*)    Albumin 2.9 (*)    AST 44 (*)    GFR calc non Af Amer 44 (*)    GFR calc Af Amer 51 (*)    All other components within normal limits  LACTIC ACID, PLASMA - Abnormal; Notable for the following:    Lactic Acid, Venous 4.4 (*)    All other components within normal limits  LACTIC ACID, PLASMA - Abnormal; Notable for the following:    Lactic Acid, Venous 3.3 (*)    All other components within normal limits  CULTURE, BLOOD (ROUTINE X 2)  CULTURE, BLOOD (ROUTINE X 2)  PROTIME-INR  URINALYSIS, ROUTINE W REFLEX MICROSCOPIC (NOT AT [facility])  TYPE AND SCREEN  ABO/RH    Imaging Review Ct  Abdomen Pelvis Wo Contrast  08/03/2015  CLINICAL DATA:  Fever, urinary tract infection, hematuria. Blood clots noted from suprapubic catheter during catheter change. Vomiting. EXAM: CT ABDOMEN AND PELVIS WITHOUT CONTRAST TECHNIQUE: Multidetector CT imaging of the abdomen and pelvis was performed following the standard protocol without IV contrast. COMPARISON:  07/03/2015 FINDINGS: Atelectasis in the lung bases. Calcified lymph nodes in the subcarinal region. Coronary artery calcifications. Cholelithiasis with multiple stones in the dependent portion of the gallbladder. No gallbladder wall thickening or inflammatory reaction. Unenhanced appearance of the liver, spleen, pancreas, adrenal glands, inferior vena cava, and retroperitoneal lymph nodes is unremarkable. Calcification of the abdominal aorta without aneurysm. Calcification possibly with aneurysm of the celiac axis origin. Kidneys demonstrate mild parenchymal scarring. No hydronephrosis or hydroureter. No renal or ureteral stones are demonstrated. Stomach, small bowel, and colon are not abnormally distended. No free air or free fluid in the abdomen. Abdominal wall musculature appears intact. Pelvis: Suprapubic catheter. Balloon is in the bladder neck. Diffuse thickening of the wall of the bladder suggesting cystitis. Increased density of the urine consistent with hemorrhage. Small stone in the dependent portion of the bladder. Gas in the bladder is likely post instrumentation. Infiltration in the subcutaneous fat over the dome of the bladder is likely postoperative scarring or infiltration. Marked enlargement of the prostate gland with transverse measurement of 6.7 cm. Gas and infiltrative stranding demonstrated in the base of the penis and in the perineal tissues extending into the left inguinal region and left hemiscrotum. This likely indicates infection with gas-forming organism. Fistula not excluded. Degenerative changes in the lumbar spine with slight  anterior subluxation of L4 on L5. Irregular lucency and sclerosis throughout the sacrum and in the right iliac bone. This may be due to osteoporosis or Paget's disease. IMPRESSION: Suprapubic catheter in the bladder. Bladder wall thickening suggesting cystitis. Small stone in the dependent portion of the bladder. Prominent prostate gland enlargement. Gas and inflammatory infiltration in the soft tissues at the base of the penis, the left perianal/perineal region, left groin, and left scrotum. Changes suggest infection with gas-forming organism. Cholelithiasis. Atelectasis in the lung bases. Changes of Paget's disease versus osteoporosis in the sacrum and pelvis. Aneurysm of the celiac axis. These results were called by telephone at the time of  interpretation on 08/03/2015 at 12:50 am to Dr. Patria Mane, who verbally acknowledged these results. Electronically Signed   By: Burman Nieves M.D.   On: 08/03/2015 00:54   Dg Chest Portable 1 View  08/02/2015  CLINICAL DATA:  Fever, cough EXAM: PORTABLE CHEST 1 VIEW COMPARISON:  07/03/2015 FINDINGS: Cardiomediastinal silhouette is stable. Mild elevation of the left hemidiaphragm again noted. No acute infiltrate or pleural effusion. No pulmonary edema. Mild basilar atelectasis. IMPRESSION: No active disease. Mild elevation of left hemidiaphragm with basilar atelectasis. Electronically Signed   By: Natasha Mead M.D.   On: 08/02/2015 21:45   I have personally reviewed and evaluated these images and lab results as part of my medical decision-making.   EKG Interpretation   Date/Time:  Friday August 02 2015 20:33:01 EDT Ventricular Rate:  106 PR Interval:    QRS Duration: 96 QT Interval:  345 QTC Calculation: 458 R Axis:   33 Text Interpretation:  Atrial fibrillation Ventricular premature complex  Inferior infarct, old Anterior infarct, old Lateral leads are also  involved No significant change was found Confirmed by Bana Borgmeyer  MD, Caryn Bee  ([id number]) on 08/02/2015  11:45:50 PM      MDM   Final diagnoses:  Septic shock (HCC)    Large amount of blood and blood clot was expelled from the patient's penis per nursing staff on arrival to the emergency department.  He is on anticoagulation.  Suprapubic catheter has blood within the catheter.  A three-way catheter was placed into his penis and he was irrigated to a light pink color.  Three-way catheter was removed.  Suprapubic catheter was then removed as he was continuing to have pain and appeared to still have some abdominal distention.  Urine then came out his catheter stoma.  A new suprapubic catheter was placed without difficulty.  CT imaging demonstrated possible gas-forming organism of the perineum.  He is tender in this area without obvious crepitus.  I long discussion with the patient's family reports at this time they do not want to have surgery.  They understand there is a high likelihood of death without aggressive measures such as IV pressors and surgical exploration.  At this time they wish [place] fluids and IV antibiotics and symptom control.  At some point they may move to a complete palliative course.  He has advanced age and multiple comorbidities.  Hospitalist admission.    Azalia Bilis, MD 08/03/15 4034  Azalia Bilis, MD 08/03/15 [phone]

## 2015-08-03 ENCOUNTER — Emergency Department (HOSPITAL_COMMUNITY): Payer: Medicare Other

## 2015-08-03 DIAGNOSIS — I482 Chronic atrial fibrillation: Secondary | ICD-10-CM | POA: Diagnosis not present

## 2015-08-03 DIAGNOSIS — T83511A Infection and inflammatory reaction due to indwelling urethral catheter, initial encounter: Secondary | ICD-10-CM

## 2015-08-03 DIAGNOSIS — Z7901 Long term (current) use of anticoagulants: Secondary | ICD-10-CM | POA: Diagnosis not present

## 2015-08-03 DIAGNOSIS — E46 Unspecified protein-calorie malnutrition: Secondary | ICD-10-CM | POA: Diagnosis present

## 2015-08-03 DIAGNOSIS — I9589 Other hypotension: Secondary | ICD-10-CM

## 2015-08-03 DIAGNOSIS — I480 Paroxysmal atrial fibrillation: Secondary | ICD-10-CM

## 2015-08-03 DIAGNOSIS — G9340 Encephalopathy, unspecified: Secondary | ICD-10-CM | POA: Diagnosis not present

## 2015-08-03 DIAGNOSIS — R319 Hematuria, unspecified: Secondary | ICD-10-CM | POA: Diagnosis not present

## 2015-08-03 DIAGNOSIS — Z515 Encounter for palliative care: Secondary | ICD-10-CM | POA: Diagnosis present

## 2015-08-03 DIAGNOSIS — Z8673 Personal history of transient ischemic attack (TIA), and cerebral infarction without residual deficits: Secondary | ICD-10-CM | POA: Diagnosis not present

## 2015-08-03 DIAGNOSIS — B961 Klebsiella pneumoniae [K. pneumoniae] as the cause of diseases classified elsewhere: Secondary | ICD-10-CM | POA: Diagnosis not present

## 2015-08-03 DIAGNOSIS — N493 Fournier gangrene: Secondary | ICD-10-CM | POA: Diagnosis present

## 2015-08-03 DIAGNOSIS — Z79899 Other long term (current) drug therapy: Secondary | ICD-10-CM | POA: Diagnosis not present

## 2015-08-03 DIAGNOSIS — N390 Urinary tract infection, site not specified: Secondary | ICD-10-CM | POA: Diagnosis not present

## 2015-08-03 DIAGNOSIS — E7800 Pure hypercholesterolemia, unspecified: Secondary | ICD-10-CM | POA: Diagnosis present

## 2015-08-03 DIAGNOSIS — R7881 Bacteremia: Secondary | ICD-10-CM | POA: Diagnosis not present

## 2015-08-03 DIAGNOSIS — I959 Hypotension, unspecified: Secondary | ICD-10-CM | POA: Diagnosis present

## 2015-08-03 DIAGNOSIS — N189 Chronic kidney disease, unspecified: Secondary | ICD-10-CM | POA: Diagnosis not present

## 2015-08-03 DIAGNOSIS — G7000 Myasthenia gravis without (acute) exacerbation: Secondary | ICD-10-CM | POA: Diagnosis present

## 2015-08-03 DIAGNOSIS — D72829 Elevated white blood cell count, unspecified: Secondary | ICD-10-CM | POA: Diagnosis not present

## 2015-08-03 DIAGNOSIS — A4159 Other Gram-negative sepsis: Secondary | ICD-10-CM | POA: Diagnosis present

## 2015-08-03 DIAGNOSIS — I350 Nonrheumatic aortic (valve) stenosis: Secondary | ICD-10-CM | POA: Diagnosis present

## 2015-08-03 DIAGNOSIS — Z9181 History of falling: Secondary | ICD-10-CM | POA: Diagnosis not present

## 2015-08-03 DIAGNOSIS — Y838 Other surgical procedures as the cause of abnormal reaction of the patient, or of later complication, without mention of misadventure at the time of the procedure: Secondary | ICD-10-CM | POA: Diagnosis present

## 2015-08-03 DIAGNOSIS — R6521 Severe sepsis with septic shock: Secondary | ICD-10-CM | POA: Diagnosis not present

## 2015-08-03 DIAGNOSIS — Z881 Allergy status to other antibiotic agents status: Secondary | ICD-10-CM | POA: Diagnosis not present

## 2015-08-03 DIAGNOSIS — R262 Difficulty in walking, not elsewhere classified: Secondary | ICD-10-CM | POA: Diagnosis not present

## 2015-08-03 DIAGNOSIS — R488 Other symbolic dysfunctions: Secondary | ICD-10-CM | POA: Diagnosis not present

## 2015-08-03 DIAGNOSIS — N183 Chronic kidney disease, stage 3 (moderate): Secondary | ICD-10-CM | POA: Diagnosis not present

## 2015-08-03 DIAGNOSIS — M6281 Muscle weakness (generalized): Secondary | ICD-10-CM | POA: Diagnosis not present

## 2015-08-03 DIAGNOSIS — Z8042 Family history of malignant neoplasm of prostate: Secondary | ICD-10-CM | POA: Diagnosis not present

## 2015-08-03 DIAGNOSIS — A419 Sepsis, unspecified organism: Secondary | ICD-10-CM | POA: Diagnosis not present

## 2015-08-03 DIAGNOSIS — R311 Benign essential microscopic hematuria: Secondary | ICD-10-CM | POA: Diagnosis not present

## 2015-08-03 DIAGNOSIS — Z87891 Personal history of nicotine dependence: Secondary | ICD-10-CM | POA: Diagnosis not present

## 2015-08-03 DIAGNOSIS — E785 Hyperlipidemia, unspecified: Secondary | ICD-10-CM | POA: Diagnosis not present

## 2015-08-03 DIAGNOSIS — F0390 Unspecified dementia without behavioral disturbance: Secondary | ICD-10-CM | POA: Diagnosis present

## 2015-08-03 DIAGNOSIS — Z87440 Personal history of urinary (tract) infections: Secondary | ICD-10-CM | POA: Diagnosis not present

## 2015-08-03 DIAGNOSIS — I129 Hypertensive chronic kidney disease with stage 1 through stage 4 chronic kidney disease, or unspecified chronic kidney disease: Secondary | ICD-10-CM | POA: Diagnosis present

## 2015-08-03 DIAGNOSIS — I4891 Unspecified atrial fibrillation: Secondary | ICD-10-CM | POA: Diagnosis present

## 2015-08-03 DIAGNOSIS — Z66 Do not resuscitate: Secondary | ICD-10-CM | POA: Diagnosis present

## 2015-08-03 DIAGNOSIS — T83518A Infection and inflammatory reaction due to other urinary catheter, initial encounter: Secondary | ICD-10-CM | POA: Diagnosis present

## 2015-08-03 LAB — URINALYSIS, ROUTINE W REFLEX MICROSCOPIC
GLUCOSE, UA: NEGATIVE mg/dL
Ketones, ur: 15 mg/dL — AB
Nitrite: POSITIVE — AB
Specific Gravity, Urine: 1.014 (ref 1.005–1.030)
UROBILINOGEN UA: 1 mg/dL (ref 0.0–1.0)
pH: 6 (ref 5.0–8.0)

## 2015-08-03 LAB — BASIC METABOLIC PANEL
Anion gap: 9 (ref 5–15)
BUN: 19 mg/dL (ref 6–20)
CALCIUM: 7.3 mg/dL — AB (ref 8.9–10.3)
CO2: 21 mmol/L — AB (ref 22–32)
CREATININE: 1.31 mg/dL — AB (ref 0.61–1.24)
Chloride: 109 mmol/L (ref 101–111)
GFR calc non Af Amer: 47 mL/min — ABNORMAL LOW (ref >60)
GFR, EST AFRICAN AMERICAN: 54 mL/min — AB (ref >60)
GLUCOSE: 121 mg/dL — AB (ref 65–99)
Potassium: 4.6 mmol/L (ref 3.5–5.1)
Sodium: 139 mmol/L (ref 135–145)

## 2015-08-03 LAB — CBC
HCT: 32.8 % — ABNORMAL LOW (ref 39.0–52.0)
Hemoglobin: 10.4 g/dL — ABNORMAL LOW (ref 13.0–17.0)
MCH: 28 pg (ref 26.0–34.0)
MCHC: 31.7 g/dL (ref 30.0–36.0)
MCV: 88.4 fL (ref 78.0–100.0)
Platelets: 274 10*3/uL (ref 150–400)
RBC: 3.71 MIL/uL — AB (ref 4.22–5.81)
RDW: 16.9 % — ABNORMAL HIGH (ref 11.5–15.5)
WBC: 26.5 10*3/uL — ABNORMAL HIGH (ref 4.0–10.5)

## 2015-08-03 LAB — LACTIC ACID, PLASMA
Lactic Acid, Venous: 3.3 mmol/L (ref 0.5–2.0)
Lactic Acid, Venous: 4.1 mmol/L (ref 0.5–2.0)

## 2015-08-03 LAB — URINE MICROSCOPIC-ADD ON

## 2015-08-03 LAB — APTT: aPTT: 39 seconds — ABNORMAL HIGH (ref 24–37)

## 2015-08-03 LAB — PROTIME-INR
INR: 1.27 (ref 0.00–1.49)
PROTHROMBIN TIME: 16 s — AB (ref 11.6–15.2)

## 2015-08-03 LAB — PROCALCITONIN: PROCALCITONIN: 95.81 ng/mL

## 2015-08-03 MED ORDER — ALUM & MAG HYDROXIDE-SIMETH 200-200-20 MG/5ML PO SUSP: 30.0000 mL | Freq: Four times a day (QID) | ORAL | Status: DC | PRN

## 2015-08-03 MED ORDER — MORPHINE SULFATE (PF) 2 MG/ML IV SOLN
1.0000 mg | INTRAVENOUS | Status: DC | PRN
  Administered 2015-08-03: 1 mg via INTRAVENOUS
  Filled 2015-08-03: qty 1

## 2015-08-03 MED ORDER — SODIUM CHLORIDE 0.9 % IV SOLN
INTRAVENOUS | Status: AC
  Administered 2015-08-03: 1000 mL via INTRAVENOUS

## 2015-08-03 MED ORDER — OXYCODONE HCL 5 MG PO TABS
5.0000 mg | ORAL_TABLET | ORAL | Status: DC | PRN
  Administered 2015-08-03 – 2015-08-05 (×2): 5 mg via ORAL
  Filled 2015-08-03 (×2): qty 1

## 2015-08-03 MED ORDER — SIMVASTATIN 20 MG PO TABS
10.0000 mg | ORAL_TABLET | Freq: Every day | ORAL | Status: DC
  Administered 2015-08-03 – 2015-08-07 (×5): 10 mg via ORAL
  Filled 2015-08-03 (×5): qty 1

## 2015-08-03 MED ORDER — MIRTAZAPINE 7.5 MG PO TABS
7.5000 mg | ORAL_TABLET | Freq: Every day | ORAL | Status: DC
  Administered 2015-08-03 – 2015-08-06 (×4): 7.5 mg via ORAL
  Filled 2015-08-03 (×4): qty 1

## 2015-08-03 MED ORDER — MIRABEGRON ER 25 MG PO TB24
25.0000 mg | ORAL_TABLET | Freq: Every day | ORAL | Status: DC
  Administered 2015-08-03 – 2015-08-07 (×4): 25 mg via ORAL
  Filled 2015-08-03 (×6): qty 1

## 2015-08-03 MED ORDER — PREDNISONE 10 MG PO TABS
10.0000 mg | ORAL_TABLET | Freq: Every day | ORAL | Status: DC
  Administered 2015-08-03 – 2015-08-07 (×5): 10 mg via ORAL
  Filled 2015-08-03 (×5): qty 1

## 2015-08-03 MED ORDER — PYRIDOSTIGMINE BROMIDE 60 MG PO TABS
30.0000 mg | ORAL_TABLET | Freq: Three times a day (TID) | ORAL | Status: DC
  Administered 2015-08-03 – 2015-08-05 (×7): 30 mg via ORAL
  Administered 2015-08-05: 60 mg via ORAL
  Administered 2015-08-05 – 2015-08-07 (×5): 30 mg via ORAL
  Filled 2015-08-03 (×17): qty 0.5

## 2015-08-03 MED ORDER — ENSURE ENLIVE PO LIQD
237.0000 mL | Freq: Two times a day (BID) | ORAL | Status: DC
  Administered 2015-08-03 – 2015-08-07 (×9): 237 mL via ORAL

## 2015-08-03 MED ORDER — SENNA 8.6 MG PO TABS
1.0000 | ORAL_TABLET | Freq: Two times a day (BID) | ORAL | Status: DC
  Administered 2015-08-03 – 2015-08-07 (×9): 8.6 mg via ORAL
  Filled 2015-08-03 (×9): qty 1

## 2015-08-03 MED ORDER — ACETAMINOPHEN 325 MG PO TABS
650.0000 mg | ORAL_TABLET | Freq: Four times a day (QID) | ORAL | Status: DC | PRN
Start: 2015-08-03 — End: 2015-08-07
  Administered 2015-08-03 – 2015-08-05 (×2): 650 mg via ORAL
  Filled 2015-08-03 (×2): qty 2

## 2015-08-03 MED ORDER — ACETAMINOPHEN 650 MG RE SUPP
650.0000 mg | Freq: Four times a day (QID) | RECTAL | Status: DC | PRN
Start: 2015-08-03 — End: 2015-08-07

## 2015-08-03 MED ORDER — ONDANSETRON HCL 4 MG PO TABS: 4.0000 mg | ORAL_TABLET | Freq: Four times a day (QID) | ORAL | Status: DC | PRN

## 2015-08-03 MED ORDER — ONDANSETRON HCL 4 MG/2ML IJ SOLN
4.0000 mg | Freq: Four times a day (QID) | INTRAMUSCULAR | Status: DC | PRN
Start: 2015-08-03 — End: 2015-08-07

## 2015-08-03 MED ORDER — VANCOMYCIN HCL IN DEXTROSE 1-5 GM/200ML-% IV SOLN
1000.0000 mg | INTRAVENOUS | Status: DC
Start: 2015-08-03 — End: 2015-08-05
  Administered 2015-08-03 – 2015-08-04 (×2): 1000 mg via INTRAVENOUS
  Filled 2015-08-03 (×3): qty 200

## 2015-08-03 MED ORDER — ADULT MULTIVITAMIN W/MINERALS CH
1.0000 | ORAL_TABLET | Freq: Every day | ORAL | Status: DC
  Administered 2015-08-03 – 2015-08-07 (×5): 1 via ORAL
  Filled 2015-08-03 (×5): qty 1

## 2015-08-03 MED ORDER — PIPERACILLIN-TAZOBACTAM 3.375 G IVPB
3.3750 g | Freq: Three times a day (TID) | INTRAVENOUS | Status: DC
  Administered 2015-08-03 – 2015-08-05 (×7): 3.375 g via INTRAVENOUS
  Filled 2015-08-03 (×10): qty 50

## 2015-08-03 MED ORDER — VITAMIN D 1000 UNITS PO TABS
2000.0000 [IU] | ORAL_TABLET | Freq: Every day | ORAL | Status: DC
  Administered 2015-08-03 – 2015-08-07 (×5): 2000 [IU] via ORAL
  Filled 2015-08-03 (×8): qty 2

## 2015-08-03 MED ORDER — DOCUSATE SODIUM 100 MG PO CAPS
100.0000 mg | ORAL_CAPSULE | Freq: Two times a day (BID) | ORAL | Status: DC
  Administered 2015-08-03 – 2015-08-07 (×9): 100 mg via ORAL
  Filled 2015-08-03 (×10): qty 1

## 2015-08-03 MED ORDER — POLYETHYLENE GLYCOL 3350 17 G PO PACK: 17.0000 g | PACK | Freq: Every day | ORAL | Status: DC | PRN

## 2015-08-03 MED ORDER — APIXABAN 2.5 MG PO TABS
2.5000 mg | ORAL_TABLET | Freq: Two times a day (BID) | ORAL | Status: DC
  Administered 2015-08-03 – 2015-08-07 (×9): 2.5 mg via ORAL
  Filled 2015-08-03 (×9): qty 1

## 2015-08-03 NOTE — Progress Notes (Signed)
[facility] PROGRESS NOTE   Seth [name] GNF:[id number] DOB: 03-11-1925 DOA: 08/02/2015 PCP:  Duane Lope, MD  HPI/Subjective: Patient moaning in bed complaining about groin pain. Seen with his son at bedside, had prolonged discussion about the the probable poor prognosis.  Assessment/Plan: Principal Problem:   Septic shock (HCC) Active Problems:   CKD (chronic kidney disease), stage III   Atrial fibrillation (HCC)   Dyslipidemia   Myasthenia gravis (HCC)   Hypotension   Leukocytosis   Urinary tract infection, site not specified   Patient seen earlier today by my colleague Dr. Lovell Sheehan, this is a no charge note. Patient seen and examined, and data base reviewed. Spoke with the son about prognosis which appears to be poor without surgical intervention. That being said patient probably will not be a good candidate for surgery with advanced age and comorbidities including A. fib, mycelia gravities and current sepsis. Continue Zosyn and vancomycin for now, palliative and hospice medicine to see the patient for goals of care.  Code Status: DNR Family Communication: Plan discussed with the patient. Disposition Plan: Remains inpatient Diet: Diet regular Room service appropriate?: Yes; Fluid consistency:: Thin  Consultants:  Palliative  Procedures:  None  Antibiotics:  Zosyn and vancomycin started on 08/03/59   Objective: Filed Vitals:   08/03/15 0843  BP: 105/44  Pulse: 73  Temp:   Resp: 18    Intake/Output Summary (Last 24 hours) at 08/03/15 1131 Last data filed at 08/03/15 0846  Gross per 24 hour  Intake   7290 ml  Output    100 ml  Net   7190 ml   Filed Weights   08/02/15 2031  Weight: 71.668 kg (158 lb)    Exam: General: Alert and awake, oriented x3, not in any acute distress. HEENT: anicteric sclera, pupils reactive to light and accommodation, EOMI CVS: S1-S2 clear, no murmur rubs or gallops Chest: clear to auscultation bilaterally, no  wheezing, rales or rhonchi Abdomen: soft nontender, nondistended, normal bowel sounds, no organomegaly Extremities: no cyanosis, clubbing or edema noted bilaterally Neuro: Cranial nerves II-XII intact, no focal neurological deficits  Data Reviewed: Basic Metabolic Panel:  Recent Labs Lab 08/02/15 2102 08/03/15 0648  NA 141 139  K 3.6 4.6  CL 106 109  CO2 23 21*  GLUCOSE 206* 121*  BUN 23* 19  CREATININE 1.37* 1.31*  CALCIUM 8.9 7.3*   Liver Function Tests:  Recent Labs Lab 08/02/15 2102  AST 44*  ALT 23  ALKPHOS 87  BILITOT 0.5  PROT 6.3*  ALBUMIN 2.9*   No results for input(s): LIPASE, AMYLASE in the last 168 hours. No results for input(s): AMMONIA in the last 168 hours. CBC:  Recent Labs Lab 08/02/15 2102 08/03/15 0648  WBC 16.5* 26.5*  NEUTROABS 16.0*  --   HGB 13.4 10.4*  HCT 39.9 32.8*  MCV 86.0 88.4  PLT 365 274   Cardiac Enzymes: No results for input(s): CKTOTAL, CKMB, CKMBINDEX, TROPONINI in the last 168 hours. BNP (last 3 results)  Recent Labs  01/17/15 0149  BNP 577.8*    ProBNP (last 3 results) No results for input(s): PROBNP in the last 8760 hours.  CBG: No results for input(s): GLUCAP in the last 168 hours.  Micro Recent Results (from the past 240 hour(s))  Blood culture (routine x 2)     Status: None (Preliminary result)   Collection Time: 08/02/15  9:02 PM  Result Value Ref Range Status   Specimen Description BLOOD RIGHT FOREARM  Final  Special Requests BOTTLES DRAWN AEROBIC ONLY 5CC  Final   Culture  Setup Time   Final    GRAM NEGATIVE RODS AEROBIC BOTTLE ONLY CRITICAL RESULT CALLED TO, READ BACK BY AND VERIFIED WITH: C. ECKELMANN,RN AT 0840 ON [id number] BY Lucienne CapersS. YARBROUGH    Culture PENDING  Incomplete   Report Status PENDING  Incomplete  Blood culture (routine x 2)     Status: None (Preliminary result)   Collection Time: 08/02/15  9:54 PM  Result Value Ref Range Status   Specimen Description BLOOD RIGHT HAND  Final    Special Requests BOTTLES DRAWN AEROBIC AND ANAEROBIC 5CC  Final   Culture  Setup Time   Final    GRAM NEGATIVE RODS ANAEROBIC BOTTLE ONLY CRITICAL RESULT CALLED TO, READ BACK BY AND VERIFIED WITH: C. ECKELMANN,RN AT 1050 ON [id number] BY S. YARBROUGH    Culture PENDING  Incomplete   Report Status PENDING  Incomplete     Studies: Ct Abdomen Pelvis Wo Contrast  08/03/2015  CLINICAL DATA:  Fever, urinary tract infection, hematuria. Blood clots noted from suprapubic catheter during catheter change. Vomiting. EXAM: CT ABDOMEN AND PELVIS WITHOUT CONTRAST TECHNIQUE: Multidetector CT imaging of the abdomen and pelvis was performed following the standard protocol without IV contrast. COMPARISON:  07/03/2015 FINDINGS: Atelectasis in the lung bases. Calcified lymph nodes in the subcarinal region. Coronary artery calcifications. Cholelithiasis with multiple stones in the dependent portion of the gallbladder. No gallbladder wall thickening or inflammatory reaction. Unenhanced appearance of the liver, spleen, pancreas, adrenal glands, inferior vena cava, and retroperitoneal lymph nodes is unremarkable. Calcification of the abdominal aorta without aneurysm. Calcification possibly with aneurysm of the celiac axis origin. Kidneys demonstrate mild parenchymal scarring. No hydronephrosis or hydroureter. No renal or ureteral stones are demonstrated. Stomach, small bowel, and colon are not abnormally distended. No free air or free fluid in the abdomen. Abdominal wall musculature appears intact. Pelvis: Suprapubic catheter. Balloon is in the bladder neck. Diffuse thickening of the wall of the bladder suggesting cystitis. Increased density of the urine consistent with hemorrhage. Small stone in the dependent portion of the bladder. Gas in the bladder is likely post instrumentation. Infiltration in the subcutaneous fat over the dome of the bladder is likely postoperative scarring or infiltration. Marked enlargement of the prostate  gland with transverse measurement of 6.7 cm. Gas and infiltrative stranding demonstrated in the base of the penis and in the perineal tissues extending into the left inguinal region and left hemiscrotum. This likely indicates infection with gas-forming organism. Fistula not excluded. Degenerative changes in the lumbar spine with slight anterior subluxation of L4 on L5. Irregular lucency and sclerosis throughout the sacrum and in the right iliac bone. This may be due to osteoporosis or Paget's disease. IMPRESSION: Suprapubic catheter in the bladder. Bladder wall thickening suggesting cystitis. Small stone in the dependent portion of the bladder. Prominent prostate gland enlargement. Gas and inflammatory infiltration in the soft tissues at the base of the penis, the left perianal/perineal region, left groin, and left scrotum. Changes suggest infection with gas-forming organism. Cholelithiasis. Atelectasis in the lung bases. Changes of Paget's disease versus osteoporosis in the sacrum and pelvis. Aneurysm of the celiac axis. These results were called by telephone at the time of interpretation on 08/03/2015 at 12:50 am to Dr. Patria Maneampos, who verbally acknowledged these results. Electronically Signed   By: Burman NievesWilliam  Stevens M.D.   On: 08/03/2015 00:54   Dg Chest Portable 1 View  08/02/2015  CLINICAL DATA:  Fever, cough  EXAM: PORTABLE CHEST 1 VIEW COMPARISON:  07/03/2015 FINDINGS: Cardiomediastinal silhouette is stable. Mild elevation of the left hemidiaphragm again noted. No acute infiltrate or pleural effusion. No pulmonary edema. Mild basilar atelectasis. IMPRESSION: No active disease. Mild elevation of left hemidiaphragm with basilar atelectasis. Electronically Signed   By: Natasha Mead M.D.   On: 08/02/2015 21:45    Scheduled Meds: . apixaban  2.5 mg Oral BID  . cholecalciferol  2,000 Units Oral Daily  . docusate sodium  100 mg Oral BID  . feeding supplement (ENSURE ENLIVE)  237 mL Oral BID BM  . mirabegron ER   25 mg Oral Daily  . mirtazapine  7.5 mg Oral QHS  . multivitamin with minerals  1 tablet Oral Daily  . piperacillin-tazobactam (ZOSYN)  IV  3.375 g Intravenous 3 times per day  . predniSONE  10 mg Oral Q breakfast  . pyridostigmine  30 mg Oral TID  . senna  1 tablet Oral BID  . simvastatin  10 mg Oral Daily  . vancomycin  1,000 mg Intravenous Q24H   Continuous Infusions: . sodium chloride 1,000 mL (08/03/15 0641)       Time spent: 35 minutes    [facility] A  [facility] Pager [phone] If 7PM-7AM, please contact night-coverage at [email or web address], password [email or web address] 08/03/2015, 11:31 AM  LOS: 0 days

## 2015-08-03 NOTE — Consult Note (Signed)
Consultation Note Date: 08/03/2015   Patient Name: Seth [name]  DOB: Jul 24, 1925  MRN: [id number]  Age / Sex: 81 y.o., male   PCP: Gildardo Cranker, MD Referring Physician: Clydia Llano, MD  Reason for Consultation: Establishing goals of care  Palliative Care Assessment and Plan Summary of Established Goals of Care and Medical Treatment Preferences   Clinical Assessment/Narrative: Pt is a 81 yo man with h/o a-fib, BPH, urinary retention, indwelling foley and now suprapubic catheter, recurrent UTI's and myasthenia gravis admitted with fever, abd pain. Pt's suprapubic catheter changed on 10/14, he began to pass blood clots. He was hypotensive. Family clearly articulate a decline over the past 6 months especially after having to go into a facility. He was initially at  ALF but decompensated in less than 30 days with urosepsis. This was tx and then he was transferred to SNF where he has been for 2 months. Family aware of how sick he is. Recognize that per CT scan his infection is severe. Pt is not a surgical candidate 2/2 advanced age, type of infection as well as his myasthenia gravis. Family reiterates DNR status. They are in favor of antibiotics for now. They recognize that liley they will not cure this but still wish to continue the antibiotics for the next 24-48 hours. Discussed while being hopeful for improvement, he may be at EOL. They verbalized agreement. Feel that he is ready to go if it's his time. We also discussed that if he does declare himself as EOL transfer to in-patient hospice is an option at this point in life and they verbalized agreement.  Contacts/Participants in Discussion: Primary Decision Maker: Family makes decisions as a whole but son, Onalee Hua is HCPOA. Pt still speaking some for himself, but critically ill and has turned this over to his family HCPOA: yes  Onalee Hua is HCPOA. Son Jesusita Oka and his wife present, Daughter Toniann Fail, son Onalee Hua and his wife present, as well as granddaughter  Toniann Fail. There is one other son who will be in later today who family has involved in decision making as well. CT scan, labs, clinical significance of information discussed  Code Status/Advance Care Planning:  DNR  Continue abx for next 24-48 hours to "give him a chace" but recognize the seriousness of infection  Not full comfort care yet, but do anticipate that this is the direction  Family wold be interested in hospice in-patient  If he declares himself as EOL  No surgery  Symptom Management:   Pain: Having intermittent abd pain. Cont prn opioids and monitor for need for sch dosing.  Palliative Prophylaxis: bowel regimen  Additional Recommendations (Limitations, Scope, Preferences):  In-patient hospice if pt becomes full comfort care Psycho-social/Spiritual:   Support System: yes  Desire for further Chaplaincy support:no  Prognosis: Weeks. Despite antibiotics, this type of infection and it's severity, + blood cultures, pt is likely at EOL 2/2 sepsis. Shared this with family  Discharge Planning:  Not confirmed yet but likely hospice in-patient unless he has a radical turn around       Chief Complaint/History of Present Illness: Pt is a 81 yo man who presented with fever, hypotension, hematuria.   Primary Diagnoses  Present on Admission:  . Septic shock (HCC) . Hypotension . Atrial fibrillation (HCC) . CKD (chronic kidney disease), stage III . Myasthenia gravis (HCC) . Leukocytosis . Dyslipidemia . Urinary tract infection, site not specified  Palliative Review of Systems: Denies pain, SHOB, n/v I have reviewed the medical record, interviewed the patient and  family, and examined the patient. The following aspects are pertinent.  Past Medical History  Diagnosis Date  . Hypertension   . Hyperlipidemia   . Aortic stenosis   . High cholesterol   . A-fib (HCC)   . History of TIAs   . Multiple falls   . Urinary incontinence   . Dementia     short term memory  loss   Social History   Social History  . Marital Status: Widowed    Spouse Name: N/A  . Number of Children: 4  . Years of Education: 16   Occupational History  . Retired     Social History Main Topics  . Smoking status: Former Games developermoker  . Smokeless tobacco: Never Used  . Alcohol Use: No  . Drug Use: No  . Sexual Activity: No   Other Topics Concern  . None   Social History Narrative   Lives at :   [facility]   Address: [address], [place], [place] [address]   Phone: [phone]      Caffeine use: Soda rare      Family History  Problem Relation Age of Onset  . Prostate cancer Brother    Scheduled Meds: . apixaban  2.5 mg Oral BID  . cholecalciferol  2,000 Units Oral Daily  . docusate sodium  100 mg Oral BID  . feeding supplement (ENSURE ENLIVE)  237 mL Oral BID BM  . mirabegron ER  25 mg Oral Daily  . mirtazapine  7.5 mg Oral QHS  . multivitamin with minerals  1 tablet Oral Daily  . piperacillin-tazobactam (ZOSYN)  IV  3.375 g Intravenous 3 times per day  . predniSONE  10 mg Oral Q breakfast  . pyridostigmine  30 mg Oral TID  . senna  1 tablet Oral BID  . simvastatin  10 mg Oral Daily  . vancomycin  1,000 mg Intravenous Q24H   Continuous Infusions: . sodium chloride 1,000 mL (08/03/15 0641)   PRN Meds:.acetaminophen **OR** acetaminophen, alum & mag hydroxide-simeth, morphine injection, ondansetron **OR** ondansetron (ZOFRAN) IV, oxyCODONE, polyethylene glycol Medications Prior to Admission:  Prior to Admission medications   Medication Sig Start Date End Date Taking? Authorizing Provider  acetaminophen (TYLENOL) 500 MG tablet Take 500 mg by mouth every 6 (six) hours as needed for mild pain.    Yes Historical Provider, MD  apixaban (ELIQUIS) 2.5 MG TABS tablet Take 2.5 mg by mouth 2 (two) times daily.   Yes Historical Provider, MD  Cholecalciferol (VITAMIN D3) 2000 UNITS TABS Take 2,000 Units by mouth daily.    Yes Historical Provider, MD   docusate sodium (COLACE) 100 MG capsule Take 1 capsule (100 mg total) by mouth 2 (two) times daily. 07/07/15  Yes Ripudeep Jenna LuoK Rai, MD  Emollient (CETAPHIL) cream Apply 1 application topically as needed (for feet).   Yes Historical Provider, MD  feeding supplement, ENSURE ENLIVE, (ENSURE ENLIVE) LIQD Take 237 mLs by mouth 2 (two) times daily between meals. 07/07/15  Yes Ripudeep Jenna LuoK Rai, MD  metoprolol succinate (TOPROL-XL) 50 MG 24 hr tablet Take 50 mg by mouth every morning. Take with or immediately following a meal.   Yes Historical Provider, MD  mirtazapine (REMERON) 7.5 MG tablet Take 7.5 mg by mouth at bedtime.   Yes Historical Provider, MD  Multiple Vitamin (MULTIVITAMIN WITH MINERALS) TABS tablet Take 1 tablet by mouth daily.   Yes Historical Provider, MD  MYRBETRIQ 25 MG TB24 tablet Take 25 mg by mouth  daily. 06/29/15  Yes Historical Provider, MD  polyethylene glycol (MIRALAX / GLYCOLAX) packet Take 17 g by mouth daily as needed for moderate constipation. 07/07/15  Yes Ripudeep Jenna Luo, MD  predniSONE (DELTASONE) 10 MG tablet Take 1 tablet (10 mg total) by mouth daily with breakfast. 07/03/15  Yes Anson Fret, MD  pyridostigmine (MESTINON) 60 MG tablet Take 1 tablet (60 mg total) by mouth 3 (three) times daily. Patient taking differently: Take 30 mg by mouth 3 (three) times daily.  07/03/15  Yes Anson Fret, MD  senna (SENOKOT) 8.6 MG TABS tablet Take 1 tablet by mouth 2 (two) times daily.    Yes Historical Provider, MD  simvastatin (ZOCOR) 10 MG tablet Take 10 mg by mouth daily.    Yes Historical Provider, MD  traMADol (ULTRAM) 50 MG tablet Take 1 tablet (50 mg total) by mouth every 6 (six) hours as needed for moderate pain. 07/07/15  Yes Ripudeep Jenna Luo, MD   Allergies  Allergen Reactions  . Ceftin [Cefuroxime] Diarrhea    Severe diarrhea   CBC:    Component Value Date/Time   WBC 26.5* 08/03/2015 0648   HGB 10.4* 08/03/2015 0648   HCT 32.8* 08/03/2015 0648   PLT 274 08/03/2015 0648     MCV 88.4 08/03/2015 0648   NEUTROABS 16.0* 08/02/2015 2102   LYMPHSABS 0.3* 08/02/2015 2102   MONOABS 0.2 08/02/2015 2102   EOSABS 0.1 08/02/2015 2102   BASOSABS 0.0 08/02/2015 2102   Comprehensive Metabolic Panel:    Component Value Date/Time   NA 139 08/03/2015 0648   NA 140 05/29/2015 1319   K 4.6 08/03/2015 0648   CL 109 08/03/2015 0648   CO2 21* 08/03/2015 0648   BUN 19 08/03/2015 0648   BUN 22 05/29/2015 1319   CREATININE 1.31* 08/03/2015 0648   GLUCOSE 121* 08/03/2015 0648   GLUCOSE 152* 05/29/2015 1319   CALCIUM 7.3* 08/03/2015 0648   AST 44* 08/02/2015 2102   ALT 23 08/02/2015 2102   ALKPHOS 87 08/02/2015 2102   BILITOT 0.5 08/02/2015 2102   BILITOT 1.2 05/29/2015 1319   PROT 6.3* 08/02/2015 2102   PROT 7.7 05/29/2015 1319   ALBUMIN 2.9* 08/02/2015 2102   ALBUMIN 4.0 05/29/2015 1319    Physical Exam: Vital Signs: BP 105/44 mmHg  Pulse 73  Temp(Src) 97.5 F (36.4 C) (Oral)  Resp 18  Ht 5\' 10"  (1.778 m)  Wt 71.668 kg (158 lb)  BMI 22.67 kg/m2  SpO2 98% SpO2: SpO2: 98 % O2 Device: O2 Device: Nasal Cannula O2 Flow Rate: O2 Flow Rate (L/min): 2 L/min Intake/output summary:  Intake/Output Summary (Last 24 hours) at 08/03/15 1251 Last data filed at 08/03/15 0846  Gross per 24 hour  Intake   7290 ml  Output    100 ml  Net   7190 ml   LBM:   Baseline Weight: Weight: 71.668 kg (158 lb) Most recent weight: Weight: 71.668 kg (158 lb)  Exam Findings:  Gen: Well nourished elderly man; lethargic Resp: No work of breathing Cardiac:tachy Neuro: Lethargic         Palliative Performance Scale: 30%              Additional Data Reviewed: Recent Labs     08/02/15  2102  08/03/15  0648  WBC  16.5*  26.5*  HGB  13.4  10.4*  PLT  365  274  NA  141  139  BUN  23*  19  CREATININE  1.37*  1.31*     Time In: 1130 Time Out: 1310 Time Total: 70 min Greater than 50%  of this time was spent counseling and coordinating care related to the above assessment  and plan. Staffed with Dr. Arthor Captain  Signed by: Irean Hong, NP  Irean Hong, NP  08/03/2015, 12:51 PM  Please contact Palliative Medicine Team phone at [phone] for questions and concerns.

## 2015-08-03 NOTE — Progress Notes (Signed)
CRITICAL VALUE ALERT  Critical value received:  Lactic acid 4.1  Date of notification:  08/03/15  Time of notification:  0935  Critical value read back:Yes.    Nurse who received alert:  Hortense Ramalybill Vickie Ponds, RN   MD notified (1st page):  Dr. Arthor CaptainElmahi  Time of first page:  [phone]  MD notified (2nd page):  Time of second page:  Responding MD:  Dr. Arthor CaptainElmahi  Time MD responded:  [phone]

## 2015-08-03 NOTE — ED Notes (Signed)
Jenkins, MD at bedside.  

## 2015-08-03 NOTE — H&P (Signed)
[facility] Admission History and Physical       Seth [name] UJW:[id number] DOB: 1925/08/23 DOA: 08/02/2015  Referring physician: EDP PCP:  Duane Lopeoss, Alan, MD  Specialists:   Chief Complaint: Bloody Urine  HPI: Seth DailyGaspard H Essex is a 81 y.o. male with a history of Atrial Fibrillation on Eliquis Rx, Myastenia Gravis, Aortic Stenosis, HTN, Recurrent UTIs who was sent from the [facility] due to Hematuria, and passage of large clots from his suprapubic catheter.  The catheter had been changed during the day at the facility.  In the afternoon, he began to have fevers and chills and in the ED was found to havea temperature of 102.6 along with hypotension and tachycardia.   A sepsis workup was initiated and he was placed on IV Vancomycin and Zosyn.  He was administered IVFs for fluid resuscitation but had no improvement in his hypotension.  His code status was discussed and goals of care, and he and his family confirmed his DNR status and wishes to have IVFs, and antibiotics but no pressors.  His wishes are to be kept comfortable.  He was referred for medical admission.      Review of Systems:  Constitutional: No Weight Loss, No Weight Gain, Night Sweats, +Fevers, +Chills, Dizziness, Light Headedness, Fatigue, +Generalized Weakness HEENT: No Headaches, Difficulty Swallowing,Tooth/Dental Problems,Sore Throat,  No Sneezing, Rhinitis, Ear Ache, Nasal Congestion, or Post Nasal Drip,  Cardio-vascular:  No Chest pain, Orthopnea, PND, Edema in Lower Extremities, Anasarca, Dizziness, Palpitations  Resp: No Dyspnea, No DOE, No Productive Cough, No Non-Productive Cough, No Hemoptysis, No Wheezing.    GI: No Heartburn, Indigestion, Abdominal Pain, Nausea, Vomiting, Diarrhea, Constipation, Hematemesis, Hematochezia, Melena, Change in Bowel Habits,  Loss of Appetite  GU: No Dysuria, +Hematuria, No Change in Color of Urine, No Urgency or Urinary Frequency, No Flank pain.  Musculoskeletal: No Joint Pain  or Swelling, No Decreased Range of Motion, No Back Pain.  Neurologic: No Syncope, No Seizures, Muscle Weakness, Paresthesia, Vision Disturbance or Loss, No Diplopia, No Vertigo, No Difficulty Walking,  Skin: No Rash or Lesions. Psych: No Change in Mood or Affect, No Depression or Anxiety, No Memory loss, No Confusion, or Hallucinations   Past Medical History  Diagnosis Date  . Hypertension   . Hyperlipidemia   . Aortic stenosis   . High cholesterol   . A-fib (HCC)   . History of TIAs   . Multiple falls   . Urinary incontinence   . Dementia     short term memory loss     Past Surgical History  Procedure Laterality Date  . Hernia repair      1970s  . Back surgery  2006  . Cervical laminectomy  2002 ?  [name]. Insertion of suprapubic catheter N/A 06/26/2015    Procedure: INSERTION OF SUPRAPUBIC CATHETER;  Surgeon: Malen GauzePatrick L McKenzie, MD;  Location: [facility];  Service: Urology;  Laterality: N/A;  . Cystoscopy N/A 06/26/2015    Procedure: CYSTOSCOPY;  Surgeon: Malen GauzePatrick L McKenzie, MD;  Location: [facility];  Service: Urology;  Laterality: N/A;      Prior to Admission medications   Medication Sig Start Date End Date Taking? Authorizing Provider  acetaminophen (TYLENOL) 500 MG tablet Take 500 mg by mouth every 6 (six) hours as needed for mild pain.    Yes Historical Provider, MD  apixaban (ELIQUIS) 2.5 MG TABS tablet Take 2.5 mg by mouth 2 (two) times daily.   Yes Historical Provider, MD  Cholecalciferol (VITAMIN D3) 2000  UNITS TABS Take 2,000 Units by mouth daily.    Yes Historical Provider, MD  docusate sodium (COLACE) 100 MG capsule Take 1 capsule (100 mg total) by mouth 2 (two) times daily. 07/07/15  Yes Ripudeep Jenna Luo, MD  Emollient (CETAPHIL) cream Apply 1 application topically as needed (for feet).   Yes Historical Provider, MD  feeding supplement, ENSURE ENLIVE, (ENSURE ENLIVE) LIQD Take 237 mLs by mouth 2 (two) times daily between meals. 07/07/15  Yes Ripudeep Jenna Luo, MD  metoprolol succinate  (TOPROL-XL) 50 MG 24 hr tablet Take 50 mg by mouth every morning. Take with or immediately following a meal.   Yes Historical Provider, MD  mirtazapine (REMERON) 7.5 MG tablet Take 7.5 mg by mouth at bedtime.   Yes Historical Provider, MD  Multiple Vitamin (MULTIVITAMIN WITH MINERALS) TABS tablet Take 1 tablet by mouth daily.   Yes Historical Provider, MD  MYRBETRIQ 25 MG TB24 tablet Take 25 mg by mouth daily. 06/29/15  Yes Historical Provider, MD  polyethylene glycol (MIRALAX / GLYCOLAX) packet Take 17 g by mouth daily as needed for moderate constipation. 07/07/15  Yes Ripudeep Jenna Luo, MD  predniSONE (DELTASONE) 10 MG tablet Take 1 tablet (10 mg total) by mouth daily with breakfast. 07/03/15  Yes Anson Fret, MD  pyridostigmine (MESTINON) 60 MG tablet Take 1 tablet (60 mg total) by mouth 3 (three) times daily. Patient taking differently: Take 30 mg by mouth 3 (three) times daily.  07/03/15  Yes Anson Fret, MD  senna (SENOKOT) 8.6 MG TABS tablet Take 1 tablet by mouth 2 (two) times daily.    Yes Historical Provider, MD  simvastatin (ZOCOR) 10 MG tablet Take 10 mg by mouth daily.    Yes Historical Provider, MD  traMADol (ULTRAM) 50 MG tablet Take 1 tablet (50 mg total) by mouth every 6 (six) hours as needed for moderate pain. 07/07/15  Yes Ripudeep Jenna Luo, MD     Allergies  Allergen Reactions  . Ceftin [Cefuroxime] Diarrhea    Severe diarrhea    Social History:  reports that he has quit smoking. He has never used smokeless tobacco. He reports that he does not drink alcohol or use illicit drugs.    Family History  Problem Relation Age of Onset  . Prostate cancer Brother        Physical Exam:  GEN:  Pleasant Elderly Obese 81 y.o. Caucasian male examined and in acute distress; cooperative with exam Filed Vitals:   08/03/15 0104 08/03/15 0115 08/03/15 0130 08/03/15 0200  BP: 88/51  Pulse: 86 80 85 90  Temp:      TempSrc:      Resp: Height:        Weight:      SpO2: 98% 98% 98% 98%   Blood pressure 88/51, pulse 90, temperature 98.8 F (37.1 C), temperature source Rectal, resp. rate 26, height  (1.778 m), weight 71.668 kg (158 lb), SpO2 98 %. PSYCH: He is alert and oriented x4; does not appear anxious does not appear depressed; affect is normal HEENT: Normocephalic and Atraumatic, Mucous membranes pink; PERRLA; EOM intact; Fundi:  Benign;  No scleral icterus, Nares: Patent, Oropharynx: Clear, Edentulous,    Neck:  FROM, No Cervical Lymphadenopathy nor Thyromegaly or Carotid Bruit; No JVD; Breasts:: Not examined CHEST WALL: No tenderness CHEST: Normal respiration, clear to auscultation bilaterally HEART: Regular rate and rhythm; no murmurs rubs or gallops BACK: No kyphosis or scoliosis;  No CVA tenderness ABDOMEN: Positive Bowel Sounds, Suprapubic Catheter Present,  Obese, Soft Non-Tender, No Rebound or Guarding; No Masses, No Organomegaly, No Pannus; No Intertriginous candida. Rectal Exam: Not done EXTREMITIES: No  Cyanosis, Clubbing, or Edema; No Ulcerations. Genitalia: not examined PULSES: 2+ and symmetric SKIN: Normal hydration no rash or ulceration CNS:  Alert and Oriented x 4, No Focal Deficits Vascular: pulses palpable throughout    Labs on Admission:  Basic Metabolic Panel:  Recent Labs Lab 08/02/15 2102  NA 141  K 3.6  CL 106  CO2 23  GLUCOSE 206*  BUN 23*  CREATININE 1.37*  CALCIUM 8.9   Liver Function Tests:  Recent Labs Lab 08/02/15 2102  AST 44*  ALT 23  ALKPHOS 87  BILITOT 0.5  PROT 6.3*  ALBUMIN 2.9*   No results for input(s): LIPASE, AMYLASE in the last 168 hours. No results for input(s): AMMONIA in the last 168 hours. CBC:  Recent Labs Lab 08/02/15 2102  WBC 16.5*  NEUTROABS 16.0*  HGB 13.4  HCT 39.9  MCV 86.0  PLT 365   Cardiac Enzymes: No results for input(s): CKTOTAL, CKMB, CKMBINDEX, TROPONINI in the last 168 hours.  BNP (last 3 results)  Recent Labs   01/17/15 0149  BNP 577.8*    ProBNP (last 3 results) No results for input(s): PROBNP in the last 8760 hours.  CBG: No results for input(s): GLUCAP in the last 168 hours.  Radiological Exams on Admission: Ct Abdomen Pelvis Wo Contrast  08/03/2015  CLINICAL DATA:  Fever, urinary tract infection, hematuria. Blood clots noted from suprapubic catheter during catheter change. Vomiting. EXAM: CT ABDOMEN AND PELVIS WITHOUT CONTRAST TECHNIQUE: Multidetector CT imaging of the abdomen and pelvis was performed following the standard protocol without IV contrast. COMPARISON:  07/03/2015 FINDINGS: Atelectasis in the lung bases. Calcified lymph nodes in the subcarinal region. Coronary artery calcifications. Cholelithiasis with multiple stones in the dependent portion of the gallbladder. No gallbladder wall thickening or inflammatory reaction. Unenhanced appearance of the liver, spleen, pancreas, adrenal glands, inferior vena cava, and retroperitoneal lymph nodes is unremarkable. Calcification of the abdominal aorta without aneurysm. Calcification possibly with aneurysm of the celiac axis origin. Kidneys demonstrate mild parenchymal scarring. No hydronephrosis or hydroureter. No renal or ureteral stones are demonstrated. Stomach, small bowel, and colon are not abnormally distended. No free air or free fluid in the abdomen. Abdominal wall musculature appears intact. Pelvis: Suprapubic catheter. Balloon is in the bladder neck. Diffuse thickening of the wall of the bladder suggesting cystitis. Increased density of the urine consistent with hemorrhage. Small stone in the dependent portion of the bladder. Gas in the bladder is likely post instrumentation. Infiltration in the subcutaneous fat over the dome of the bladder is likely postoperative scarring or infiltration. Marked enlargement of the prostate gland with transverse measurement of 6.7 cm. Gas and infiltrative stranding demonstrated in the base of the penis and in  the perineal tissues extending into the left inguinal region and left hemiscrotum. This likely indicates infection with gas-forming organism. Fistula not excluded. Degenerative changes in the lumbar spine with slight anterior subluxation of L4 on L5. Irregular lucency and sclerosis throughout the sacrum and in the right iliac bone. This may be due to osteoporosis or Paget's disease. IMPRESSION: Suprapubic catheter in the bladder. Bladder wall thickening suggesting cystitis. Small stone in the dependent portion of the bladder. Prominent prostate gland enlargement. Gas and inflammatory infiltration in the soft tissues at the base of the penis, the left perianal/perineal region, left  groin, and left scrotum. Changes suggest infection with gas-forming organism. Cholelithiasis. Atelectasis in the lung bases. Changes of Paget's disease versus osteoporosis in the sacrum and pelvis. Aneurysm of the celiac axis. These results were called by telephone at the time of interpretation on 08/03/2015 at 12:50 am to Dr. Patria Mane, who verbally acknowledged these results. Electronically Signed   By: Burman Nieves M.D.   On: 08/03/2015 00:54   Dg Chest Portable 1 View  08/02/2015  CLINICAL DATA:  Fever, cough EXAM: PORTABLE CHEST 1 VIEW COMPARISON:  07/03/2015 FINDINGS: Cardiomediastinal silhouette is stable. Mild elevation of the left hemidiaphragm again noted. No acute infiltrate or pleural effusion. No pulmonary edema. Mild basilar atelectasis. IMPRESSION: No active disease. Mild elevation of left hemidiaphragm with basilar atelectasis. Electronically Signed   By: Natasha Mead M.D.   On: 08/02/2015 21:45     EKG: Independently reviewed. Atrial Fibrillation rate =106, Old Anterior and Inferior Infarct Changes    Assessment/Plan:     81 y.o. male with  Principal Problem:   1.   Septic shock (HCC)/ Sepsis   IV Vancomycin and Zosyn   IVFs   Active Problems:   2.    Hypotension   IVFs   Hold Metoprolol Rx       3.    Urinary tract infection, site not specified   IV Vancomycin and Zosyn   Urine C+S sent       4.   Leukocytosis- due to #1   Monitor Trend     5.   CKD (chronic kidney disease), stage III   Monitor BUN/Cr     6.   Atrial fibrillation (HCC)   Continue Eliquis Rx     7.   Dyslipidemia   Continue Simvastatin Rx     8.   Myasthenia gravis (HCC)   Continue Pyridostigmine Rx     9.   DVT Prophlyaxis    On Eliquis Rx   10.   Other - Palliative Care Consultation   For Goals of Care and Initiation of Comfort Care measures if condition worsens     Code Status:     DO NOT RESUSCITATE (DNR)      Family Communication:   Family at Bedside     Disposition Plan:    Inpatient  Status        Time spent:  101 Minutes      Ron Parker [facility] Pager [phone]   If 7AM -7PM Please Contact the Day Rounding Team MD for [facility]  If 7PM-7AM, Please Contact Night-Floor Coverage  [email or web address] Password [email or web address] 08/03/2015, 2:58 AM     ADDENDUM:   Patient was seen and examined on 08/03/2015

## 2015-08-03 NOTE — Plan of Care (Signed)
Problem: Phase I Progression Outcomes Goal: Voiding-avoid urinary catheter unless indicated Outcome: Not Applicable Date Met:  01/74/94 Chronic suprapubic catheter.

## 2015-08-03 NOTE — ED Notes (Signed)
Fluid bolus #2 started

## 2015-08-03 NOTE — ED Notes (Signed)
5th bolus started

## 2015-08-03 NOTE — Progress Notes (Signed)
New Admission Note:   Arrival Method: stretcher Mental Orientation: alert to self and family Telemetry:no Assessment: Completed Skin:see doc flow sheet WU:[id number]:right forearm Pain:will call md Tubes:foley supra pubic Safety Measures: Safety Fall Prevention Plan has been given, and discussed Admission:family returning in am [address] [place] Orientation: Patient has been orientated to the room, unit and staff.  Family:at bedside on arrival to unit  Orders have been reviewed and implemented. Will continue to monitor the patient. Call light has been placed within reach and bed alarm has been activated.   Clement Sayresathie Sinaya Minogue, RN Phone number: [phone]

## 2015-08-03 NOTE — Progress Notes (Signed)
CRITICAL VALUE ALERT  Critical value received:  Positive blood cultures gram neg rods in anaerobic bottle in 2nd set.  Date of notification:  08/03/15  Time of notification:  1039  Critical value read back:Yes.    Nurse who received alert:  Hortense Ramalybill Aisha Greenberger, RN  MD notified (1st page):  Dr. Arthor CaptainElmahi  Time of first page:  1050  MD notified (2nd page):  Time of second page:  Responding MD:  Dr. Arthor CaptainElmahi  Time MD responded:  1057

## 2015-08-03 NOTE — ED Notes (Signed)
When pt's suprapubic catheter removed, the pt's bladder emptied. The urine was clear and yellow, no clots noted. Patria Maneampos, MD at bedside.

## 2015-08-03 NOTE — Progress Notes (Signed)
ANTIBIOTIC CONSULT NOTE - INITIAL  Pharmacy Consult for Vancomycin/Zosyn  Indication: rule out sepsis, ?UTI source  Allergies  Allergen Reactions  . Ceftin [Cefuroxime] Diarrhea    Severe diarrhea    Patient Measurements: Height: 5\' 10"  (177.8 cm) Weight: 158 lb (71.668 kg) IBW/kg (Calculated) : 73  Vital Signs: Temp: 98.2 F (36.8 C) (10/15 0442) Temp Source: Rectal (10/14 2305) BP: 88/45 mmHg (10/15 0442) Pulse Rate: 71 (10/15 0442) Intake/Output from previous day: 10/14 0701 - 10/15 0700 In: 7000 [I.V.:5500] Out: 100 [Urine:100] Intake/Output from this shift: Total I/O In: 7000 [I.V.:5500; Other:1500] Out: 100 [Urine:100]  Labs:  Recent Labs  08/02/15 2102  WBC 16.5*  HGB 13.4  PLT 365  CREATININE 1.37*   Estimated Creatinine Clearance: 37.8 mL/min (by C-G formula based on Cr of 1.37).  Medical History: Past Medical History  Diagnosis Date  . Hypertension   . Hyperlipidemia   . Aortic stenosis   . High cholesterol   . A-fib (HCC)   . History of TIAs   . Multiple falls   . Urinary incontinence   . Dementia     short term memory loss    Assessment: 81 y/o M from NH, starting broad spectrum anti-biotics for r/o sepsis, possible urinary source, WBC elevated, noted renal dysfunction, other labs/meds reviewed.   Goal of Therapy:  Vancomycin trough level 15-20 mcg/ml  Plan:  -Vancomycin 1000 mg IV q24h -Zosyn 3.375G IV q8h to be infused over 4 hours -Trend WBC, temp, renal function  -Drug levels as indicated  -F/U urine culture  Abran DukeLedford, Josephmichael Lisenbee 08/03/2015,5:41 AM

## 2015-08-03 NOTE — Progress Notes (Addendum)
CRITICAL VALUE ALERT  Critical value received:  Positive blood cultures gram neg rods in aerobic bottle in 1st set.  Date of notification:  08/03/15  Time of notification:  0839  Critical value read back:Yes.    Nurse who received alert:  Hortense Ramalybill Kayde Warehime, RN  MD notified (1st page):  Dr. Arthor CaptainElmahi  Time of first page:  0840  MD notified (2nd page):  Time of second page:  Responding MD:  Dr. Arthor CaptainElmahi  Time MD responded:  [phone]

## 2015-08-03 NOTE — ED Notes (Signed)
6th saline bolus started.

## 2015-08-04 LAB — BASIC METABOLIC PANEL
Anion gap: 6 (ref 5–15)
BUN: 12 mg/dL (ref 6–20)
CHLORIDE: 107 mmol/L (ref 101–111)
CO2: 23 mmol/L (ref 22–32)
Calcium: 7.8 mg/dL — ABNORMAL LOW (ref 8.9–10.3)
Creatinine, Ser: 0.97 mg/dL (ref 0.61–1.24)
GFR calc Af Amer: >60 mL/min (ref >60)
GLUCOSE: 111 mg/dL — AB (ref 65–99)
POTASSIUM: 3.8 mmol/L (ref 3.5–5.1)
SODIUM: 136 mmol/L (ref 135–145)

## 2015-08-04 LAB — CBC
HEMATOCRIT: 27.5 % — AB (ref 39.0–52.0)
HEMOGLOBIN: 8.6 g/dL — AB (ref 13.0–17.0)
MCH: 27.4 pg (ref 26.0–34.0)
MCHC: 31.3 g/dL (ref 30.0–36.0)
MCV: 87.6 fL (ref 78.0–100.0)
Platelets: 204 10*3/uL (ref 150–400)
RBC: 3.14 MIL/uL — ABNORMAL LOW (ref 4.22–5.81)
RDW: 17.2 % — ABNORMAL HIGH (ref 11.5–15.5)
WBC: 13.6 10*3/uL — AB (ref 4.0–10.5)

## 2015-08-04 MED ORDER — SODIUM CHLORIDE 0.9 % IV SOLN
INTRAVENOUS | Status: DC
  Administered 2015-08-04 – 2015-08-07 (×5): via INTRAVENOUS

## 2015-08-04 NOTE — Progress Notes (Signed)
Patient's family was requesting to have a family meeting with Dr. Arthor CaptainElmahi this afternoon. Dr. Arthor CaptainElmahi was unable to arrange for this meeting. Eduard RouxSarah Bullard, NP with palliative care already spoke to the family and explained this to them. Will continue to follow.  Leanna BattlesEckelmann, Olene Godfrey Eileen, RN.

## 2015-08-04 NOTE — Progress Notes (Signed)
[facility] PROGRESS NOTE   SANAV REMER WUJ:[id number] DOB: 06-May-1925 DOA: 08/02/2015 PCP:  Duane Lope, MD  HPI/Subjective: Appears much better than yesterday, seen with his son at bedside. Appear much stronger than yesterday, ate all his breakfast and denies any complaints.  Assessment/Plan: Principal Problem:   Septic shock (HCC) Active Problems:   CKD (chronic kidney disease), stage III   Atrial fibrillation (HCC)   Dyslipidemia   Myasthenia gravis (HCC)   Hypotension   Leukocytosis   Urinary tract infection, site not specified   Palliative care encounter    Severe sepsis Patient presented to the hospital with fever of 102.9 and WBC of 26.5, with presence of UTI/perineal infection. Lactic acid was up to 4.4 and procalcitonin of 95.8. Blood pressure was down to 77/57 which is improved with aggressive IV fluid hydration. Did not need vasopressors. On vancomycin and Zosyn, continue current antibiotics.  Perineal infection Likely for Fournier's gangrene, no plans for surgery as patient has multiple comorbidities and advanced age. This is discussed with patient and family, they do not want surgery for the time being. On vancomycin and Zosyn appears to be improving very well. Patient is afebrile, leukocytosis improving and he is clinically much better.  UTI Complicated UTI associated with suprapubic indwelling catheter. Patient is on Zosyn, continue current antibiotics and adjust according to the culture results.  Myasthenia gravis Continue home medications no acute exacerbations currently.  CKD stage III Creatinine is better than ever, its 0.9 currently. Follow infrequently.   Code Status: DNR Family Communication: Plan discussed with the patient, spoke with son Onalee Hua at bedside, questions answered. Disposition Plan: Remains inpatient Diet: Diet regular Room service appropriate?: Yes; Fluid consistency::  Thin  Consultants:  Palliative  Procedures:  None  Antibiotics:  Zosyn and vancomycin started on 08/03/59   Objective: Filed Vitals:   08/04/15 0828  BP: 140/62  Pulse: 80  Temp: 97.9 F (36.6 C)  Resp: 18    Intake/Output Summary (Last 24 hours) at 08/04/15 1202 Last data filed at 08/04/15 0828  Gross per 24 hour  Intake 3009.58 ml  Output    900 ml  Net 2109.58 ml   Filed Weights   08/02/15 2031 08/03/15 2024  Weight: 71.668 kg (158 lb) 90.674 kg (199 lb 14.4 oz)    Exam: General: Alert and awake, oriented x3, not in any acute distress. HEENT: anicteric sclera, pupils reactive to light and accommodation, EOMI CVS: S1-S2 clear, no murmur rubs or gallops Chest: clear to auscultation bilaterally, no wheezing, rales or rhonchi Abdomen: soft nontender, nondistended, normal bowel sounds, no organomegaly Extremities: no cyanosis, clubbing or edema noted bilaterally Neuro: Cranial nerves II-XII intact, no focal neurological deficits  Data Reviewed: Basic Metabolic Panel:  Recent Labs Lab 08/02/15 2102 08/03/15 0648 08/04/15 0550  NA 141 139 136  K 3.6 4.6 3.8  CL 106 109 107  CO2 23 21* 23  GLUCOSE 206* 121* 111*  BUN 23* 19 12  CREATININE 1.37* 1.31* 0.97  CALCIUM 8.9 7.3* 7.8*   Liver Function Tests:  Recent Labs Lab 08/02/15 2102  AST 44*  ALT 23  ALKPHOS 87  BILITOT 0.5  PROT 6.3*  ALBUMIN 2.9*   No results for input(s): LIPASE, AMYLASE in the last 168 hours. No results for input(s): AMMONIA in the last 168 hours. CBC:  Recent Labs Lab 08/02/15 2102 08/03/15 0648 08/04/15 0550  WBC 16.5* 26.5* 13.6*  NEUTROABS 16.0*  --   --   HGB 13.4 10.4* 8.6*  HCT 39.9 32.8* 27.5*  MCV 86.0 88.4 87.6  PLT 365 274 204   Cardiac Enzymes: No results for input(s): CKTOTAL, CKMB, CKMBINDEX, TROPONINI in the last 168 hours. BNP (last 3 results)  Recent Labs  01/17/15 0149  BNP 577.8*    ProBNP (last 3 results) No results for input(s):  PROBNP in the last 8760 hours.  CBG: No results for input(s): GLUCAP in the last 168 hours.  Micro Recent Results (from the past 240 hour(s))  Blood culture (routine x 2)     Status: None (Preliminary result)   Collection Time: 08/02/15  9:02 PM  Result Value Ref Range Status   Specimen Description BLOOD RIGHT FOREARM  Final   Special Requests BOTTLES DRAWN AEROBIC ONLY 5CC  Final   Culture  Setup Time   Final    GRAM NEGATIVE RODS AEROBIC BOTTLE ONLY CRITICAL RESULT CALLED TO, READ BACK BY AND VERIFIED WITH: C. ECKELMANN,RN AT 0840 ON [id number] BY S. YARBROUGH    Culture GRAM NEGATIVE RODS  Final   Report Status PENDING  Incomplete  Blood culture (routine x 2)     Status: None (Preliminary result)   Collection Time: 08/02/15  9:54 PM  Result Value Ref Range Status   Specimen Description BLOOD RIGHT HAND  Final   Special Requests BOTTLES DRAWN AEROBIC AND ANAEROBIC 5CC  Final   Culture  Setup Time   Final    GRAM NEGATIVE RODS IN BOTH AEROBIC AND ANAEROBIC BOTTLES CRITICAL RESULT CALLED TO, READ BACK BY AND VERIFIED WITH: C. ECKELMANN,RN AT 1050 ON [id number] BY S. YARBROUGH    Culture GRAM NEGATIVE RODS  Final   Report Status PENDING  Incomplete     Studies: Ct Abdomen Pelvis Wo Contrast  08/03/2015  CLINICAL DATA:  Fever, urinary tract infection, hematuria. Blood clots noted from suprapubic catheter during catheter change. Vomiting. EXAM: CT ABDOMEN AND PELVIS WITHOUT CONTRAST TECHNIQUE: Multidetector CT imaging of the abdomen and pelvis was performed following the standard protocol without IV contrast. COMPARISON:  07/03/2015 FINDINGS: Atelectasis in the lung bases. Calcified lymph nodes in the subcarinal region. Coronary artery calcifications. Cholelithiasis with multiple stones in the dependent portion of the gallbladder. No gallbladder wall thickening or inflammatory reaction. Unenhanced appearance of the liver, spleen, pancreas, adrenal glands, inferior vena cava, and  retroperitoneal lymph nodes is unremarkable. Calcification of the abdominal aorta without aneurysm. Calcification possibly with aneurysm of the celiac axis origin. Kidneys demonstrate mild parenchymal scarring. No hydronephrosis or hydroureter. No renal or ureteral stones are demonstrated. Stomach, small bowel, and colon are not abnormally distended. No free air or free fluid in the abdomen. Abdominal wall musculature appears intact. Pelvis: Suprapubic catheter. Balloon is in the bladder neck. Diffuse thickening of the wall of the bladder suggesting cystitis. Increased density of the urine consistent with hemorrhage. Small stone in the dependent portion of the bladder. Gas in the bladder is likely post instrumentation. Infiltration in the subcutaneous fat over the dome of the bladder is likely postoperative scarring or infiltration. Marked enlargement of the prostate gland with transverse measurement of 6.7 cm. Gas and infiltrative stranding demonstrated in the base of the penis and in the perineal tissues extending into the left inguinal region and left hemiscrotum. This likely indicates infection with gas-forming organism. Fistula not excluded. Degenerative changes in the lumbar spine with slight anterior subluxation of L4 on L5. Irregular lucency and sclerosis throughout the sacrum and in the right iliac bone. This may be due to osteoporosis or Paget's disease.  IMPRESSION: Suprapubic catheter in the bladder. Bladder wall thickening suggesting cystitis. Small stone in the dependent portion of the bladder. Prominent prostate gland enlargement. Gas and inflammatory infiltration in the soft tissues at the base of the penis, the left perianal/perineal region, left groin, and left scrotum. Changes suggest infection with gas-forming organism. Cholelithiasis. Atelectasis in the lung bases. Changes of Paget's disease versus osteoporosis in the sacrum and pelvis. Aneurysm of the celiac axis. These results were called by  telephone at the time of interpretation on 08/03/2015 at 12:50 am to Dr. Patria Mane, who verbally acknowledged these results. Electronically Signed   By: Burman Nieves M.D.   On: 08/03/2015 00:54   Dg Chest Portable 1 View  08/02/2015  CLINICAL DATA:  Fever, cough EXAM: PORTABLE CHEST 1 VIEW COMPARISON:  07/03/2015 FINDINGS: Cardiomediastinal silhouette is stable. Mild elevation of the left hemidiaphragm again noted. No acute infiltrate or pleural effusion. No pulmonary edema. Mild basilar atelectasis. IMPRESSION: No active disease. Mild elevation of left hemidiaphragm with basilar atelectasis. Electronically Signed   By: Natasha Mead M.D.   On: 08/02/2015 21:45    Scheduled Meds: . apixaban  2.5 mg Oral BID  . cholecalciferol  2,000 Units Oral Daily  . docusate sodium  100 mg Oral BID  . feeding supplement (ENSURE ENLIVE)  237 mL Oral BID BM  . mirabegron ER  25 mg Oral Daily  . mirtazapine  7.5 mg Oral QHS  . multivitamin with minerals  1 tablet Oral Daily  . piperacillin-tazobactam (ZOSYN)  IV  3.375 g Intravenous 3 times per day  . predniSONE  10 mg Oral Q breakfast  . pyridostigmine  30 mg Oral TID  . senna  1 tablet Oral BID  . simvastatin  10 mg Oral Daily  . vancomycin  1,000 mg Intravenous Q24H   Continuous Infusions:       Time spent: 35 minutes    [facility]  [facility] Pager [phone] If 7PM-7AM, please contact night-coverage at [email or web address], password [facility] 08/04/2015, 12:02 PM  LOS: 1 day

## 2015-08-04 NOTE — Consult Note (Signed)
Urology Consult  Referring physician: Lajean Manes Reason for referral: Sepsis  Chief Complaint: Sepsis  History of Present Illness: Elderly male with medical comorbidities; M Gravis/A fib/cardiac; sent to hospital after s/p tube (placed Sept 7th) change associated with hematuria; sepsis and hypotension on admission and family elected hospice care/DNR; family wanted iv antibiotics but no pressors; on eliquis; CRI type 3; Cr 1.37 on admission- now .97; WBC as high as 26; now WBC is 13.6; gm negative rods in blood; lactic acidosis  CT scan: no hydro or renal stones; balloon of s/p tube is in the bladder neck; evidence of cystitis; likwly blood in bladder with small stone; gas and stranding base of penis and perineum and left inguinal area and left scrotum; no air in peritoneum and abdominal wall OK   Treated with iv antibiotics and had palliative care consultation; iv fluids given  Today he is feeling much better; ate breakfast;  Modifying factors: There are no other modifying factors  Associated signs and symptoms: There are no other associated signs and symptoms Aggravating and relieving factors: There are no other aggravating or relieving factors Severity: Severe Duration: Greatly improved   Past Medical History  Diagnosis Date  . Hypertension   . Hyperlipidemia   . Aortic stenosis   . High cholesterol   . A-fib ([place])   . History of TIAs   . Multiple falls   . Urinary incontinence   . Dementia     short term memory loss   Past Surgical History  Procedure Laterality Date  . Hernia repair      1970s  . Back surgery  2006  . Cervical laminectomy  2002 ?  [name] Insertion of suprapubic catheter N/A 06/26/2015    Procedure: INSERTION OF SUPRAPUBIC CATHETER;  Surgeon: Cleon Gustin, MD;  Location: [facility];  Service: Urology;  Laterality: N/A;  . Cystoscopy N/A 06/26/2015    Procedure: CYSTOSCOPY;  Surgeon: Cleon Gustin, MD;  Location: [facility];  Service: Urology;  Laterality: N/A;     Medications: I have reviewed the patient's current medications. Allergies:  Allergies  Allergen Reactions  . Ceftin [Cefuroxime] Diarrhea    Severe diarrhea    Family History  Problem Relation Age of Onset  . Prostate cancer Brother    Social History:  reports that he has quit smoking. He has never used smokeless tobacco. He reports that he does not drink alcohol or use illicit drugs.  ROS: All systems are reviewed and negative except as noted. Rest negative  Physical Exam:  Vital signs in last 24 hours: Temp:  [97.9 F (36.6 C)-98.6 F (37 C)] 97.9 F (36.6 C) (10/16 0828) Pulse Rate:  [73-80] 80 (10/16 0828) Resp:  [17-18] 18 (10/16 0828) BP: (113-140)/(49-62) 140/62 mmHg (10/16 0828) SpO2:  [95 %-98 %] 97 % (10/16 0828) Weight:  [90.674 kg (199 lb 14.4 oz)] 90.674 kg (199 lb 14.4 oz) (10/15 2024)  Cardiovascular: Skin warm; not flushed Respiratory: Breaths quiet; no shortness of breath Abdomen: No masses Neurological: Normal sensation to touch Musculoskeletal: Normal motor function arms and legs Lymphatics: No inguinal adenopathy Skin: No rashes Genitourinary:looks great; belly normal with s/p tube and clear urine; scrotum moderately swollen "more edema" and NO cellulitis or crepitus; perineum and groins look bet edematous but quite benign and nontender  Laboratory Data:  Results for orders placed or performed during the hospital encounter of 08/02/15 (from the past 72 hour(s))  CBC with Differential/Platelet     Status: Abnormal  Collection Time: 08/02/15  9:02 PM  Result Value Ref Range   WBC 16.5 (H) 4.0 - 10.5 K/uL   RBC 4.64 4.22 - 5.81 MIL/uL   Hemoglobin 13.4 13.0 - 17.0 g/dL   HCT 39.9 39.0 - 52.0 %   MCV 86.0 78.0 - 100.0 fL   MCH 28.9 26.0 - 34.0 pg   MCHC 33.6 30.0 - 36.0 g/dL   RDW 16.6 (H) 11.5 - 15.5 %   Platelets 365 150 - 400 K/uL   Neutrophils Relative % 97 %   Neutro Abs 16.0 (H) 1.7 - 7.7 K/uL   Lymphocytes Relative 2 %   Lymphs Abs  0.3 (L) 0.7 - 4.0 K/uL   Monocytes Relative 1 %   Monocytes Absolute 0.2 0.1 - 1.0 K/uL   Eosinophils Relative 0 %   Eosinophils Absolute 0.1 0.0 - 0.7 K/uL   Basophils Relative 0 %   Basophils Absolute 0.0 0.0 - 0.1 K/uL  Comprehensive metabolic panel     Status: Abnormal   Collection Time: 08/02/15  9:02 PM  Result Value Ref Range   Sodium 141 135 - 145 mmol/L   Potassium 3.6 3.5 - 5.1 mmol/L   Chloride 106 101 - 111 mmol/L   CO2 23 22 - 32 mmol/L   Glucose, Bld 206 (H) 65 - 99 mg/dL   BUN 23 (H) 6 - 20 mg/dL   Creatinine, Ser 1.37 (H) 0.61 - 1.24 mg/dL   Calcium 8.9 8.9 - 10.3 mg/dL   Total Protein 6.3 (L) 6.5 - 8.1 g/dL   Albumin 2.9 (L) 3.5 - 5.0 g/dL   AST 44 (H) 15 - 41 U/L   ALT 23 17 - 63 U/L   Alkaline Phosphatase 87 38 - 126 U/L   Total Bilirubin 0.5 0.3 - 1.2 mg/dL   GFR calc non Af Amer 44 (L) >60 mL/min   GFR calc Af Amer 51 (L) >60 mL/min    Comment: (NOTE) The eGFR has been calculated using the CKD EPI equation. This calculation has not been validated in all clinical situations. eGFR's persistently <60 mL/min signify possible Chronic Kidney Disease.    Anion gap 12 5 - 15  Type and screen [place]     Status: None   Collection Time: 08/02/15  9:02 PM  Result Value Ref Range   ABO/RH(D) A POS    Antibody Screen NEG    Sample Expiration 08/05/2015   Protime-INR     Status: None   Collection Time: 08/02/15  9:02 PM  Result Value Ref Range   Prothrombin Time 15.1 11.6 - 15.2 seconds   INR 1.17 0.00 - 1.49  Blood culture (routine x 2)     Status: None (Preliminary result)   Collection Time: 08/02/15  9:02 PM  Result Value Ref Range   Specimen Description BLOOD RIGHT FOREARM    Special Requests BOTTLES DRAWN AEROBIC ONLY 5CC    Culture  Setup Time      GRAM NEGATIVE RODS AEROBIC BOTTLE ONLY CRITICAL RESULT CALLED TO, READ BACK BY AND VERIFIED WITH: C. ECKELMANN,RN AT [place] ON [id number] BY Rhea Bleacher    Culture GRAM NEGATIVE RODS     Report Status PENDING   ABO/Rh     Status: None   Collection Time: 08/02/15  9:02 PM  Result Value Ref Range   ABO/RH(D) A POS   Blood culture (routine x 2)     Status: None (Preliminary result)   Collection Time: 08/02/15  9:54  PM  Result Value Ref Range   Specimen Description BLOOD RIGHT HAND    Special Requests BOTTLES DRAWN AEROBIC AND ANAEROBIC 5CC    Culture  Setup Time      GRAM NEGATIVE RODS IN BOTH AEROBIC AND ANAEROBIC BOTTLES CRITICAL RESULT CALLED TO, READ BACK BY AND VERIFIED WITH: C. ECKELMANN,RN AT 3790 ON [id number] BY S. YARBROUGH    Culture GRAM NEGATIVE RODS    Report Status PENDING   Lactic acid, plasma     Status: Abnormal   Collection Time: 08/02/15  9:54 PM  Result Value Ref Range   Lactic Acid, Venous 4.4 (HH) 0.5 - 2.0 mmol/L    Comment: CRITICAL RESULT CALLED TO, READ BACK BY AND VERIFIED WITH: LULIS A,RN 08/02/15 2231 WAYK   Lactic acid, plasma     Status: Abnormal   Collection Time: 08/03/15 12:48 AM  Result Value Ref Range   Lactic Acid, Venous 3.3 (HH) 0.5 - 2.0 mmol/L    Comment: CRITICAL RESULT CALLED TO, READ BACK BY AND VERIFIED WITH: LULIS A,RN 08/03/15 0118 WAYK   Urinalysis, Routine w reflex microscopic (not at [facility])     Status: Abnormal   Collection Time: 08/03/15  1:54 AM  Result Value Ref Range   Color, Urine RED (A) YELLOW    Comment: BIOCHEMICALS MAY BE AFFECTED BY COLOR   APPearance TURBID (A) CLEAR   Specific Gravity, Urine 1.014 1.005 - 1.030   pH 6.0 5.0 - 8.0   Glucose, UA NEGATIVE NEGATIVE mg/dL   Hgb urine dipstick LARGE (A) NEGATIVE   Bilirubin Urine MODERATE (A) NEGATIVE   Ketones, ur 15 (A) NEGATIVE mg/dL   Protein, ur >300 (A) NEGATIVE mg/dL   Urobilinogen, UA 1.0 0.0 - 1.0 mg/dL   Nitrite POSITIVE (A) NEGATIVE   Leukocytes, UA LARGE (A) NEGATIVE  Urine microscopic-add on     Status: Abnormal   Collection Time: 08/03/15  1:54 AM  Result Value Ref Range   Squamous Epithelial / LPF RARE RARE   WBC, UA 7-10 <3 WBC/hpf    RBC / HPF TOO NUMEROUS TO COUNT <3 RBC/hpf   Bacteria, UA MANY (A) RARE   Urine-Other MICROSCOPIC EXAM PERFORMED ON UNCONCENTRATED URINE   Basic metabolic panel     Status: Abnormal   Collection Time: 08/03/15  6:48 AM  Result Value Ref Range   Sodium 139 135 - 145 mmol/L   Potassium 4.6 3.5 - 5.1 mmol/L   Chloride 109 101 - 111 mmol/L   CO2 21 (L) 22 - 32 mmol/L   Glucose, Bld 121 (H) 65 - 99 mg/dL   BUN 19 6 - 20 mg/dL   Creatinine, Ser 1.31 (H) 0.61 - 1.24 mg/dL   Calcium 7.3 (L) 8.9 - 10.3 mg/dL   GFR calc non Af Amer 47 (L) >60 mL/min   GFR calc Af Amer 54 (L) >60 mL/min    Comment: (NOTE) The eGFR has been calculated using the CKD EPI equation. This calculation has not been validated in all clinical situations. eGFR's persistently <60 mL/min signify possible Chronic Kidney Disease.    Anion gap 9 5 - 15  CBC     Status: Abnormal   Collection Time: 08/03/15  6:48 AM  Result Value Ref Range   WBC 26.5 (H) 4.0 - 10.5 K/uL   RBC 3.71 (L) 4.22 - 5.81 MIL/uL   Hemoglobin 10.4 (L) 13.0 - 17.0 g/dL    Comment: DELTA CHECK NOTED REPEATED TO VERIFY    HCT 32.8 (L)  39.0 - 52.0 %   MCV 88.4 78.0 - 100.0 fL   MCH 28.0 26.0 - 34.0 pg   MCHC 31.7 30.0 - 36.0 g/dL   RDW 16.9 (H) 11.5 - 15.5 %   Platelets 274 150 - 400 K/uL  Procalcitonin     Status: None   Collection Time: 08/03/15  6:48 AM  Result Value Ref Range   Procalcitonin 95.81 ng/mL    Comment:        Interpretation: PCT >= 10 ng/mL: Important systemic inflammatory response, almost exclusively due to severe bacterial sepsis or septic shock. (NOTE)         ICU PCT Algorithm               Non ICU PCT Algorithm    ----------------------------     ------------------------------         PCT < 0.25 ng/mL                 PCT < 0.1 ng/mL     Stopping of antibiotics            Stopping of antibiotics       strongly encouraged.               strongly encouraged.    ----------------------------      ------------------------------       PCT level decrease by               PCT < 0.25 ng/mL       >= 80% from peak PCT       OR PCT 0.25 - 0.5 ng/mL          Stopping of antibiotics                                             encouraged.     Stopping of antibiotics           encouraged.    ----------------------------     ------------------------------       PCT level decrease by              PCT >= 0.25 ng/mL       < 80% from peak PCT        AND PCT >= 0.5 ng/mL             Continuing antibiotics                                              encouraged.       Continuing antibiotics            encouraged.    ----------------------------     ------------------------------     PCT level increase compared          PCT > 0.5 ng/mL         with peak PCT AND          PCT >= 0.5 ng/mL             Escalation of antibiotics  strongly encouraged.      Escalation of antibiotics        strongly encouraged.   Protime-INR     Status: Abnormal   Collection Time: 08/03/15  6:48 AM  Result Value Ref Range   Prothrombin Time 16.0 (H) 11.6 - 15.2 seconds   INR 1.27 0.00 - 1.49  APTT     Status: Abnormal   Collection Time: 08/03/15  6:48 AM  Result Value Ref Range   aPTT 39 (H) 24 - 37 seconds    Comment:        IF BASELINE aPTT IS ELEVATED, SUGGEST PATIENT RISK ASSESSMENT BE USED TO DETERMINE APPROPRIATE ANTICOAGULANT THERAPY.   Lactic acid, plasma     Status: Abnormal   Collection Time: 08/03/15  8:50 AM  Result Value Ref Range   Lactic Acid, Venous 4.1 (HH) 0.5 - 2.0 mmol/L    Comment: CRITICAL RESULT CALLED TO, READ BACK BY AND VERIFIED WITH: ECKELMANNCRN [id number] [place]   Basic metabolic panel     Status: Abnormal   Collection Time: 08/04/15  5:50 AM  Result Value Ref Range   Sodium 136 135 - 145 mmol/L   Potassium 3.8 3.5 - 5.1 mmol/L   Chloride 107 101 - 111 mmol/L   CO2 23 22 - 32 mmol/L   Glucose, Bld 111 (H) 65 - 99 mg/dL   BUN 12 6  - 20 mg/dL   Creatinine, Ser 0.97 0.61 - 1.24 mg/dL   Calcium 7.8 (L) 8.9 - 10.3 mg/dL   GFR calc non Af Amer >60 >60 mL/min   GFR calc Af Amer >60 >60 mL/min    Comment: (NOTE) The eGFR has been calculated using the CKD EPI equation. This calculation has not been validated in all clinical situations. eGFR's persistently <60 mL/min signify possible Chronic Kidney Disease.    Anion gap 6 5 - 15  CBC     Status: Abnormal   Collection Time: 08/04/15  5:50 AM  Result Value Ref Range   WBC 13.6 (H) 4.0 - 10.5 K/uL   RBC 3.14 (L) 4.22 - 5.81 MIL/uL   Hemoglobin 8.6 (L) 13.0 - 17.0 g/dL   HCT 27.5 (L) 39.0 - 52.0 %   MCV 87.6 78.0 - 100.0 fL   MCH 27.4 26.0 - 34.0 pg   MCHC 31.3 30.0 - 36.0 g/dL   RDW 17.2 (H) 11.5 - 15.5 %   Platelets 204 150 - 400 K/uL   Recent Results (from the past 240 hour(s))  Blood culture (routine x 2)     Status: None (Preliminary result)   Collection Time: 08/02/15  9:02 PM  Result Value Ref Range Status   Specimen Description BLOOD RIGHT FOREARM  Final   Special Requests BOTTLES DRAWN AEROBIC ONLY 5CC  Final   Culture  Setup Time   Final    GRAM NEGATIVE RODS AEROBIC BOTTLE ONLY CRITICAL RESULT CALLED TO, READ BACK BY AND VERIFIED WITH: C. ECKELMANN,RN AT 0840 ON [id number] BY S. YARBROUGH    Culture GRAM NEGATIVE RODS  Final   Report Status PENDING  Incomplete  Blood culture (routine x 2)     Status: None (Preliminary result)   Collection Time: 08/02/15  9:54 PM  Result Value Ref Range Status   Specimen Description BLOOD RIGHT HAND  Final   Special Requests BOTTLES DRAWN AEROBIC AND ANAEROBIC 5CC  Final   Culture  Setup Time   Final    GRAM NEGATIVE RODS IN BOTH AEROBIC AND ANAEROBIC  BOTTLES CRITICAL RESULT CALLED TO, READ BACK BY AND VERIFIED WITH: C. ECKELMANN,RN AT 8101 ON [id number] BY S. 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

## 2015-08-04 NOTE — Progress Notes (Signed)
Daily Progress Note   Patient Name: Seth [name]       Date: 08/04/2015 DOB: 03-13-1925  Age: 81 y.o. MRN#: [id number] Attending Physician: Clydia LlanoMutaz Elmahi, MD Primary Care Physician:  Duane Lopeoss, Alan, MD Admit Date: 08/02/2015  Reason for Consultation/Follow-up: Establishing goals of care, Pain control and Psychosocial/spiritual support  Subjective: Pt more alert . Labs improving . Family at the bedside. They are pleased to see improvement but also struggling with this change as they thought he was acutely dying. We did discuss this that at this point we still do not know the overall big picture in terms of this infection. In attending's note Fournier's Gangrene mentioned as likely clinical condition. Surgery is usual treatment, but pt does not desire surgery nor is he a candidate.  Interval Events: Clinical improvment Length of Stay: 1 day  Current Medications: Scheduled Meds:  . apixaban  2.5 mg Oral BID  . cholecalciferol  2,000 Units Oral Daily  . docusate sodium  100 mg Oral BID  . feeding supplement (ENSURE ENLIVE)  237 mL Oral BID BM  . mirabegron ER  25 mg Oral Daily  . mirtazapine  7.5 mg Oral QHS  . multivitamin with minerals  1 tablet Oral Daily  . piperacillin-tazobactam (ZOSYN)  IV  3.375 g Intravenous 3 times per day  . predniSONE  10 mg Oral Q breakfast  . pyridostigmine  30 mg Oral TID  . senna  1 tablet Oral BID  . simvastatin  10 mg Oral Daily  . vancomycin  1,000 mg Intravenous Q24H    Continuous Infusions:    PRN Meds: acetaminophen **OR** acetaminophen, alum & mag hydroxide-simeth, morphine injection, ondansetron **OR** ondansetron (ZOFRAN) IV, oxyCODONE, polyethylene glycol  Palliative Performance Scale: 40%     Vital Signs: BP 140/62 mmHg  Pulse 80  Temp(Src) 97.9 F (36.6 C) (Oral)  Resp 18  Ht 5\' 10"  (1.778 m)  Wt 90.674 kg (199 lb 14.4 oz)  BMI 28.68 kg/m2  SpO2 97% SpO2: SpO2: 97 % O2 Device: O2 Device: Not Delivered O2 Flow Rate: O2  Flow Rate (L/min): 2 L/min  Intake/output summary:  Intake/Output Summary (Last 24 hours) at 08/04/15 1417 Last data filed at 08/04/15 1236  Gross per 24 hour  Intake 2596.66 ml  Output    700 ml  Net 1896.66 ml   LBM:   Baseline Weight: Weight: 71.668 kg (158 lb) Most recent weight: Weight: 90.674 kg (199 lb 14.4 oz)  Physical Exam: General: Well nourished elderly man. He is A/O x 3 Resp: No work of breathing GU: Suprapubic catheter draining clear yellow urine              Additional Data Reviewed: Recent Labs     08/03/15  0648  08/04/15  0550  WBC  26.5*  13.6*  HGB  10.4*  8.6*  PLT  274  204  NA  139  136  BUN  19  12  CREATININE  1.31*  0.97     Problem List:  Patient Active Problem List   Diagnosis Date Noted  . Septic shock (HCC) 08/03/2015  . Hypotension 08/03/2015  . Leukocytosis 08/03/2015  . Urinary tract infection, site not specified 08/03/2015  . Palliative care encounter   . Acute renal failure superimposed on stage 3 chronic kidney disease (HCC) 07/13/2015  . Myasthenia gravis (HCC) 07/13/2015  . Acute encephalopathy 07/04/2015  . Fecal impaction (HCC) 07/04/2015  . Dementia 07/04/2015  . Pressure ulcer 07/04/2015  .  AP (abdominal pain)   . Hypoxia   . Altered mental status   . Consciousness loss of 05/29/2015  . Vertigo, central origin 05/29/2015  . Dizziness and giddiness 05/29/2015  . Weakness 05/29/2015  . Cerebral brain hemorrhage (HCC) 05/29/2015  . TIA (transient ischemic attack) 05/29/2015  . Proximal limb muscle weakness 05/29/2015  . Ataxia 05/29/2015  . Syncope 03/31/2015  . CKD (chronic kidney disease), stage III 03/31/2015  . HTN (hypertension) 03/31/2015  . Atrial fibrillation (HCC) 03/31/2015  . Dyslipidemia 03/31/2015     Palliative Care Assessment & Plan    Code Status:  DNR  Goals of Care:  Urology consult for additional information as to prognosis re: Fournier's Gangrene prognosis without surgery  Family  open to hospice, but not comfort care at this point  Maximum medical management for infection   Symptom Management:  Pain: No c/o pain today. Cont prn opioids for moderate to severe pain   Psycho-social/Spiritual:  Desire for further Chaplaincy support:no   Prognosis: Unable to determine Discharge Planning: TBD. Urology input helpful in determining whether pt will have recurrent infection and sepsis and might qualify for in-pt hospice vs. Hospice in facility  Care plan was discussed with Dr. Arthor Captain  Thank you for allowing the Palliative Medicine Team to assist in the care of this patient.   Time In: 1200  Time Out: 1230 Total Time 30 min Prolonged Time Billed  no     Greater than 50%  of this time was spent counseling and coordinating care related to the above assessment and plan.   Irean Hong, NP  08/04/2015, 2:18 PM  Please contact Palliative Medicine Team phone at [phone] for questions and concerns.

## 2015-08-04 NOTE — Progress Notes (Signed)
Utilization Review Completed.Seth [name] T10/16/2016  

## 2015-08-04 NOTE — Plan of Care (Signed)
Problem: Phase II Progression Outcomes Goal: Voiding independently Outcome: Not Applicable Date Met:  82/51/89 Patient has a chronic suprapubic catheter.

## 2015-08-05 LAB — CULTURE, BLOOD (ROUTINE X 2)

## 2015-08-05 LAB — GLUCOSE, CAPILLARY: Glucose-Capillary: 174 mg/dL — ABNORMAL HIGH (ref 65–99)

## 2015-08-05 MED ORDER — DEXTROSE 5 % IV SOLN
2.0000 g | INTRAVENOUS | Status: DC
  Administered 2015-08-05 – 2015-08-06 (×2): 2 g via INTRAVENOUS
  Filled 2015-08-05 (×3): qty 2

## 2015-08-05 NOTE — Progress Notes (Signed)
[facility] PROGRESS NOTE   STEVENSON WINDMILLER WUJ:[id number] DOB: 07/27/1925 DOA: 08/02/2015 PCP:  Duane Lope, MD  HPI/Subjective: Seen with daughter bedside. Feels much better. Ate his breakfast, denies any complaints.  Assessment/Plan: Principal Problem:   Septic shock (HCC) Active Problems:   CKD (chronic kidney disease), stage III   Atrial fibrillation (HCC)   Dyslipidemia   Myasthenia gravis (HCC)   Hypotension   Leukocytosis   Urinary tract infection, site not specified   Palliative care encounter    Severe sepsis Patient presented to the hospital with fever of 102.6 and WBC of 26.5, with presence of UTI/perineal infection. Lactic acid was up to 4.4 and procalcitonin of 95.8. Blood pressure was down to 77/57 which is improved with aggressive IV fluid hydration. Did not need vasopressors. On vancomycin and Zosyn, continue current antibiotics.  Klebsiella pneumoniae septicemia Blood cultures positive for Klebsiella pneumoniae, probably part of the sepsis syndrome. Patient is on Zosyn and vancomycin. Antibiotics de-escalated to Rocephin.  Perineal infection CT scan of pelvis showed gas and inflammation in the perineal area/base of the penis. Likely for Fournier's gangrene, no plans for surgery as patient has multiple comorbidities and advanced age. This is discussed with patient and family, they do not want surgery for the time being. On vancomycin and Zosyn appears to be improving very well. Patient is afebrile, leukocytosis improving and he is clinically much better. Urology consulted, agreed that he is not a good candidate for surgical intervention. Continue current antibiotics.  UTI Complicated UTI associated with suprapubic indwelling catheter. Patient is on Zosyn, continue current antibiotics and adjust according to the culture results.  Myasthenia gravis Continue home medications no acute exacerbations currently.  CKD stage III Creatinine is better  than ever, its 0.9 currently. Follow infrequently.   Code Status: DNR Family Communication: Plan discussed with the patient, spoke with son Onalee Hua at bedside, questions answered. Disposition Plan: Remains inpatient Diet: Diet regular Room service appropriate?: Yes; Fluid consistency:: Thin  Consultants:  Palliative  Procedures:  None  Antibiotics:  Zosyn and vancomycin started on 08/03/59   Objective: Filed Vitals:   08/05/15 0911  BP: 130/64  Pulse: 76  Temp: 97.9 F (36.6 C)  Resp: 18    Intake/Output Summary (Last 24 hours) at 08/05/15 1523 Last data filed at 08/05/15 1400  Gross per 24 hour  Intake 2153.75 ml  Output   4750 ml  Net -2596.25 ml   Filed Weights   08/02/15 2031 08/03/15 2024 08/04/15 2002  Weight: 71.668 kg (158 lb) 90.674 kg (199 lb 14.4 oz) 89.948 kg (198 lb 4.8 oz)    Exam: General: Alert and awake, oriented x3, not in any acute distress. HEENT: anicteric sclera, pupils reactive to light and accommodation, EOMI CVS: S1-S2 clear, no murmur rubs or gallops Chest: clear to auscultation bilaterally, no wheezing, rales or rhonchi Abdomen: soft nontender, nondistended, normal bowel sounds, no organomegaly Extremities: no cyanosis, clubbing or edema noted bilaterally Neuro: Cranial nerves II-XII intact, no focal neurological deficits  Data Reviewed: Basic Metabolic Panel:  Recent Labs Lab 08/02/15 2102 08/03/15 0648 08/04/15 0550  NA 141 139 136  K 3.6 4.6 3.8  CL 106 109 107  CO2 23 21* 23  GLUCOSE 206* 121* 111*  BUN 23* 19 12  CREATININE 1.37* 1.31* 0.97  CALCIUM 8.9 7.3* 7.8*   Liver Function Tests:  Recent Labs Lab 08/02/15 2102  AST 44*  ALT 23  ALKPHOS 87  BILITOT 0.5  PROT 6.3*  ALBUMIN 2.9*  No results for input(s): LIPASE, AMYLASE in the last 168 hours. No results for input(s): AMMONIA in the last 168 hours. CBC:  Recent Labs Lab 08/02/15 2102 08/03/15 0648 08/04/15 0550  WBC 16.5* 26.5* 13.6*   NEUTROABS 16.0*  --   --   HGB 13.4 10.4* 8.6*  HCT 39.9 32.8* 27.5*  MCV 86.0 88.4 87.6  PLT 365 274 204   Cardiac Enzymes: No results for input(s): CKTOTAL, CKMB, CKMBINDEX, TROPONINI in the last 168 hours. BNP (last 3 results)  Recent Labs  01/17/15 0149  BNP 577.8*    ProBNP (last 3 results) No results for input(s): PROBNP in the last 8760 hours.  CBG:  Recent Labs Lab 08/05/15 1126  GLUCAP 174*    Micro Recent Results (from the past 240 hour(s))  Blood culture (routine x 2)     Status: None (Preliminary result)   Collection Time: 08/02/15  9:02 PM  Result Value Ref Range Status   Specimen Description BLOOD RIGHT FOREARM  Final   Special Requests BOTTLES DRAWN AEROBIC ONLY 5CC  Final   Culture  Setup Time   Final    GRAM NEGATIVE RODS AEROBIC BOTTLE ONLY CRITICAL RESULT CALLED TO, READ BACK BY AND VERIFIED WITH: C. ECKELMANN,RN AT 0840 ON [id number] BY S. YARBROUGH    Culture KLEBSIELLA PNEUMONIAE  Final   Report Status PENDING  Incomplete  Blood culture (routine x 2)     Status: None   Collection Time: 08/02/15  9:54 PM  Result Value Ref Range Status   Specimen Description BLOOD RIGHT HAND  Final   Special Requests BOTTLES DRAWN AEROBIC AND ANAEROBIC 5CC  Final   Culture  Setup Time   Final    GRAM NEGATIVE RODS IN BOTH AEROBIC AND ANAEROBIC BOTTLES CRITICAL RESULT CALLED TO, READ BACK BY AND VERIFIED WITH: C. ECKELMANN,RN AT 1050 ON [id number] BY S. YARBROUGH    Culture KLEBSIELLA PNEUMONIAE  Final   Report Status 08/05/2015 FINAL  Final   Organism ID, Bacteria KLEBSIELLA PNEUMONIAE  Final      Susceptibility   Klebsiella pneumoniae - MIC*    AMPICILLIN >=32 RESISTANT Resistant     CEFAZOLIN <=4 SENSITIVE Sensitive     CEFEPIME <=1 SENSITIVE Sensitive     CEFTAZIDIME <=1 SENSITIVE Sensitive     CEFTRIAXONE <=1 SENSITIVE Sensitive     CIPROFLOXACIN <=0.25 SENSITIVE Sensitive     GENTAMICIN <=1 SENSITIVE Sensitive     IMIPENEM <=0.25 SENSITIVE Sensitive      TRIMETH/SULFA <=20 SENSITIVE Sensitive     AMPICILLIN/SULBACTAM 4 SENSITIVE Sensitive     PIP/TAZO <=4 SENSITIVE Sensitive     * KLEBSIELLA PNEUMONIAE     Studies: No results found.  Scheduled Meds: . apixaban  2.5 mg Oral BID  . cefTRIAXone (ROCEPHIN)  IV  2 g Intravenous Q24H  . cholecalciferol  2,000 Units Oral Daily  . docusate sodium  100 mg Oral BID  . feeding supplement (ENSURE ENLIVE)  237 mL Oral BID BM  . mirabegron ER  25 mg Oral Daily  . mirtazapine  7.5 mg Oral QHS  . multivitamin with minerals  1 tablet Oral Daily  . predniSONE  10 mg Oral Q breakfast  . pyridostigmine  30 mg Oral TID  . senna  1 tablet Oral BID  . simvastatin  10 mg Oral Daily   Continuous Infusions: . sodium chloride 75 mL/hr at 08/05/15 0534       Time spent: 35 minutes    Jaelynn Currier A  [facility] Pager [phone] If 7PM-7AM, please contact night-coverage at [email or web address], password [email or web address] 08/05/2015, 3:23 PM  LOS: 2 days

## 2015-08-05 NOTE — Progress Notes (Signed)
Subjective: Patient reports minimal pain in his scrotum. No suprapubic pain.  Objective: Vital signs in last 24 hours: Temp:  [97.9 F (36.6 C)-98.1 F (36.7 C)] 98.1 F (36.7 C) (10/17 1823) Pulse Rate:  [76-88] 79 (10/17 1823) Resp:  [17-18] 18 (10/17 1823) BP: (96-130)/(59-64) 126/59 mmHg (10/17 1823) SpO2:  [96 %-97 %] 97 % (10/17 1823)  Intake/Output from previous day: 10/16 0701 - 10/17 0700 In: 2683.8 [P.O.:1200; I.V.:983.8; IV Piggyback:500] Out: 4100 [Urine:4100] Intake/Output this shift:    Physical Exam:  General:alert, cooperative and appears stated age GI: soft, non tender, normal bowel sounds, no palpable masses, no organomegaly, no inguinal hernia Male genitalia: Penis: swelling Urethral Meatus: normal Testicles: normal, no masses Scrotum: edema: bilateral and erythema: bilateral Epididymis: normal Resp: clear to auscultation bilaterally Extremities: extremities normal, atraumatic, no cyanosis or edema  Lab Results:  Recent Labs  08/02/15 2102 08/03/15 0648 08/04/15 0550  HGB 13.4 10.4* 8.6*  HCT 39.9 32.8* 27.5*   BMET  Recent Labs  08/03/15 0648 08/04/15 0550  NA 139 136  K 4.6 3.8  CL 109 107  CO2 21* 23  GLUCOSE 121* 111*  BUN 19 12  CREATININE 1.31* 0.97  CALCIUM 7.3* 7.8*    Recent Labs  08/02/15 2102 08/03/15 0648  INR 1.17 1.27   No results for input(s): LABURIN in the last 72 hours. Results for orders placed or performed during the hospital encounter of 08/02/15  Blood culture (routine x 2)     Status: None (Preliminary result)   Collection Time: 08/02/15  9:02 PM  Result Value Ref Range Status   Specimen Description BLOOD RIGHT FOREARM  Final   Special Requests BOTTLES DRAWN AEROBIC ONLY 5CC  Final   Culture  Setup Time   Final    GRAM NEGATIVE RODS AEROBIC BOTTLE ONLY CRITICAL RESULT CALLED TO, READ BACK BY AND VERIFIED WITH: C. ECKELMANN,RN AT 0840 ON [id number] BY S. YARBROUGH    Culture KLEBSIELLA PNEUMONIAE   Final   Report Status PENDING  Incomplete  Blood culture (routine x 2)     Status: None   Collection Time: 08/02/15  9:54 PM  Result Value Ref Range Status   Specimen Description BLOOD RIGHT HAND  Final   Special Requests BOTTLES DRAWN AEROBIC AND ANAEROBIC 5CC  Final   Culture  Setup Time   Final    GRAM NEGATIVE RODS IN BOTH AEROBIC AND ANAEROBIC BOTTLES CRITICAL RESULT CALLED TO, READ BACK BY AND VERIFIED WITH: C. ECKELMANN,RN AT 1050 ON [id number] BY S. YARBROUGH    Culture KLEBSIELLA PNEUMONIAE  Final   Report Status 08/05/2015 FINAL  Final   Organism ID, Bacteria KLEBSIELLA PNEUMONIAE  Final      Susceptibility   Klebsiella pneumoniae - MIC*    AMPICILLIN >=32 RESISTANT Resistant     CEFAZOLIN <=4 SENSITIVE Sensitive     CEFEPIME <=1 SENSITIVE Sensitive     CEFTAZIDIME <=1 SENSITIVE Sensitive     CEFTRIAXONE <=1 SENSITIVE Sensitive     CIPROFLOXACIN <=0.25 SENSITIVE Sensitive     GENTAMICIN <=1 SENSITIVE Sensitive     IMIPENEM <=0.25 SENSITIVE Sensitive     TRIMETH/SULFA <=20 SENSITIVE Sensitive     AMPICILLIN/SULBACTAM 4 SENSITIVE Sensitive     PIP/TAZO <=4 SENSITIVE Sensitive     * KLEBSIELLA PNEUMONIAE    Studies/Results: No results found.  Assessment/Plan: 81yo traumatic SP tube, UTI, scrotal cellulitis  Plan: 1. Continue broad spectrum antibiotics 2. Continue SP tube to straight drain   LOS: 2  days   Lanita Stammen L 08/05/2015, 8:53 PM

## 2015-08-05 NOTE — Progress Notes (Signed)
Physical Therapy Evaluation Patient Details Name: Seth [name] MRN: [id number] DOB: 1925-05-28 Today's Date: 08/05/2015   History of Present Illness  Seth [name] is a 81 y.o. male with a history of Atrial Fibrillation on Eliquis Rx, Myastenia Gravis, Aortic Stenosis, HTN, Recurrent UTIs who [facility] due to Hematuria, and passage of large clots from his suprapubic catheter.  Clinical Impression  Pt extremely debilitated requiring assist x 2 for OOB mobility. Pt remains approriate to return to SNF upon d/c as pt unable to care for self safely at ALF.    Follow Up Recommendations SNF;Supervision/Assistance - 24 hour    Equipment Recommendations  None recommended by PT    Recommendations for Other Services       Precautions / Restrictions Precautions Precautions: Fall Restrictions Weight Bearing Restrictions: No      Mobility  Bed Mobility Overal bed mobility: Needs Assistance Bed Mobility: Rolling;Sidelying to Sit Rolling: Mod assist Sidelying to sit: Mod assist;+2 for physical assistance       General bed mobility comments: assist for trunk elevation and LE management  Transfers Overall transfer level: Needs assistance Equipment used:  (attempted to use RW however unable) Transfers: Sit to/from [employer] CorporationStand;Stand Pivot Transfers Sit to Stand: Max assist;+2 physical assistance Stand pivot transfers: Max assist;+2 physical assistance       General transfer comment: pt unable to clear bottom from bed without assist from PT and tech and use of gait belt and bed pad. took multiple trials, pt unable to use RW at this time. PT used gait belt and bed pad to complete stand pvt with tech  Ambulation/Gait Ambulation/Gait assistance:  (unable)              Stairs            Wheelchair Mobility    Modified Rankin (Stroke Patients Only)       Balance Overall balance assessment: Needs assistance Sitting-balance support: Feet  supported;No upper extremity supported Sitting balance-Leahy Scale: Poor Sitting balance - Comments: pt requires UE support for safe sitting EOB and min guard                                     Pertinent Vitals/Pain Pain Assessment: No/denies pain    Home Living Family/patient expects to be discharged to:: Skilled nursing facility                 Additional Comments: pt was in ALF but [facility]    Prior Function Level of Independence: Needs assistance   Gait / Transfers Assistance Needed: pt reports he uses a w/c currently but was ambulating short distances with RW   ADL's / Homemaking Assistance Needed: assist for LB dressing, bathing        Hand Dominance   Dominant Hand: Right    Extremity/Trunk Assessment   Upper Extremity Assessment: RUE deficits/detail;LUE deficits/detail RUE Deficits / Details: grossly 3+/5, shld flexion limited to 85 deg actively     LUE Deficits / Details: grossly 3+/5, active shld flex limited to 85 deg   Lower Extremity Assessment: Generalized weakness (bilat LE 3-/5)      Cervical / Trunk Assessment: Kyphotic  Communication   Communication: No difficulties  Cognition Arousal/Alertness: Awake/alert Behavior During Therapy: WFL for tasks assessed/performed Overall Cognitive Status: History of cognitive impairments - at baseline  General Comments      Exercises General Exercises - Lower Extremity Ankle Circles/Pumps: AROM;Both;5 reps Quad Sets: AROM;Both;5 reps;Seated Long Arc Quad: AROM;Both;5 reps;Seated Heel Slides: AROM;5 reps;Both      Assessment/Plan    PT Assessment Patient needs continued PT services  PT Diagnosis Difficulty walking;Generalized weakness   PT Problem List Decreased strength;Decreased range of motion;Decreased activity tolerance;Decreased balance;Decreased mobility;Decreased safety awareness  PT Treatment  Interventions DME instruction;Gait training;Stair training;Functional mobility training;Therapeutic activities;Therapeutic exercise;Balance training;Neuromuscular re-education   PT Goals (Current goals can be found in the Care Plan section) Acute Rehab PT Goals Patient Stated Goal: get stronger PT Goal Formulation: With patient Time For Goal Achievement: 08/12/15 Potential to Achieve Goals: Good    Frequency Min 2X/week   Barriers to discharge        Co-evaluation               End of Session Equipment Utilized During Treatment: Gait belt Activity Tolerance: Patient tolerated treatment well Patient left: in chair;with call bell/phone within reach;with chair alarm set Nurse Communication: Mobility status;Need for lift equipment (use the steady)         Time: 1191-4782 PT Time Calculation (min) (ACUTE ONLY): 19 min   Charges:   PT Evaluation $Initial PT Evaluation Tier I: 1 Procedure     PT G CodesMarcene Brawn 08/05/2015, 11:07 AM   Lewis Shock, PT, DPT Pager #: [phone] Office #: [phone]

## 2015-08-06 DIAGNOSIS — Z66 Do not resuscitate: Secondary | ICD-10-CM | POA: Diagnosis present

## 2015-08-06 LAB — BASIC METABOLIC PANEL
ANION GAP: 8 (ref 5–15)
BUN: 14 mg/dL (ref 6–20)
CALCIUM: 8.4 mg/dL — AB (ref 8.9–10.3)
CO2: 26 mmol/L (ref 22–32)
CREATININE: 0.93 mg/dL (ref 0.61–1.24)
Chloride: 104 mmol/L (ref 101–111)
Glucose, Bld: 121 mg/dL — ABNORMAL HIGH (ref 65–99)
Potassium: 3.8 mmol/L (ref 3.5–5.1)
Sodium: 138 mmol/L (ref 135–145)

## 2015-08-06 LAB — CULTURE, BLOOD (ROUTINE X 2)

## 2015-08-06 LAB — CBC
HCT: 28.1 % — ABNORMAL LOW (ref 39.0–52.0)
HEMOGLOBIN: 9.3 g/dL — AB (ref 13.0–17.0)
MCH: 28 pg (ref 26.0–34.0)
MCHC: 33.1 g/dL (ref 30.0–36.0)
MCV: 84.6 fL (ref 78.0–100.0)
PLATELETS: 215 10*3/uL (ref 150–400)
RBC: 3.32 MIL/uL — AB (ref 4.22–5.81)
RDW: 17.2 % — ABNORMAL HIGH (ref 11.5–15.5)
WBC: 9.6 10*3/uL (ref 4.0–10.5)

## 2015-08-06 NOTE — Progress Notes (Signed)
[facility] PROGRESS NOTE   Gwenith DailyGaspard H Crooker ZOX:[id number] DOB: [birth year] DOA: 08/02/2015 PCP:  Duane Lopeoss, Alan, MD  HPI/Subjective: Seen with daughter bedside. Feels much better. Ate his breakfast, denies any complaints.  Brief summary of hospital course: Patient is 81 [facility], brought to the hospital because of lethargy. He was found to have hypertension, febrile and signs consistent with sepsis. CT scan was done in the emergency department and showed gas in the perineum probably consistent with Fournier gangrene, also had severe UTI. Blood culture comes back positive for Klebsiella pneumoniae bacteremia. Patient was on Zosyn and vancomycin, after the blood culture showed Klebsiella antibiotics switched to Rocephin. Needs 1-2 more days of IV antibiotics, PT/OT and probably can go back to the SNF. Patient seen by urology, all services as well as family agreed that he is poor candidate for any kind of surgical intervention, continue antibiotics.  Assessment/Plan: Principal Problem:   Septic shock (HCC) Active Problems:   CKD (chronic kidney disease), stage III   Atrial fibrillation (HCC)   Dyslipidemia   Myasthenia gravis (HCC)   Hypotension   Leukocytosis   Urinary tract infection, site not specified   Palliative care encounter    Severe sepsis Patient presented to the hospital with fever of 102.6 and WBC of 26.5, with presence of UTI/perineal infection. Lactic acid was up to 4.4 and procalcitonin of 95.8. Blood pressure was down to 77/57 which is improved with aggressive IV fluid hydration. Did not need vasopressors. Was on vancomycin and Zosyn, switched to Rocephin.  Klebsiella pneumoniae septicemia Blood cultures positive for Klebsiella pneumoniae, probably part of the sepsis syndrome. Patient is was Zosyn and vancomycin. Antibiotics de-escalated to Rocephin.  Perineal infection CT scan of pelvis showed gas and  inflammation in the perineal area/base of the penis. Likely for Fournier's gangrene, no plans for surgery as patient has multiple comorbidities and advanced age. This is discussed with patient and family, they do not want surgery for the time being. On vancomycin and Zosyn appears to be improving very well. Patient is afebrile, leukocytosis improving and he is clinically much better. Urology consulted, agreed that he is not a good candidate for surgical intervention. Continue current antibiotics.  UTI Complicated UTI associated with suprapubic indwelling catheter. Patient is on Zosyn, continue current antibiotics and adjust according to the culture results.  Myasthenia gravis Continue home medications no acute exacerbations currently.  CKD stage III Creatinine is better than ever, its 0.9 currently. Follow infrequently.   Code Status: DNR Family Communication: Plan discussed with the patient, spoke with son Onalee HuaDavid at bedside, questions answered. Disposition Plan: Remains inpatient Diet: Diet regular Room service appropriate?: Yes; Fluid consistency:: Thin  Consultants:  Palliative  Procedures:  None  Antibiotics:  Zosyn and vancomycin started on 08/03/59   Objective: Filed Vitals:   08/06/15 0907  BP: 136/77  Pulse:   Temp: 98.5 F (36.9 C)  Resp: 18    Intake/Output Summary (Last 24 hours) at 08/06/15 1126 Last data filed at 08/06/15 0907  Gross per 24 hour  Intake 2061.25 ml  Output   3900 ml  Net -1838.75 ml   Filed Weights   08/03/15 2024 08/04/15 2002 08/05/15 2100  Weight: 90.674 kg (199 lb 14.4 oz) 89.948 kg (198 lb 4.8 oz) 90.22 kg (198 lb 14.4 oz)    Exam: General: Alert and awake, oriented x3, not in any acute distress. HEENT: anicteric sclera, pupils reactive to light and accommodation, EOMI CVS:  S1-S2 clear, no murmur rubs or gallops Chest: clear to auscultation bilaterally, no wheezing, rales or rhonchi Abdomen: soft nontender, nondistended,  normal bowel sounds, no organomegaly Extremities: no cyanosis, clubbing or edema noted bilaterally Neuro: Cranial nerves II-XII intact, no focal neurological deficits  Data Reviewed: Basic Metabolic Panel:  Recent Labs Lab 08/02/15 2102 08/03/15 0648 08/04/15 0550 08/06/15 0354  NA 141 139 136 138  K 3.6 4.6 3.8 3.8  CL 106 109 107 104  CO2 23 21* 23 26  GLUCOSE 206* 121* 111* 121*  BUN 23* CREATININE 1.37* 1.31* 0.97 0.93  CALCIUM 8.9 7.3* 7.8* 8.4*   Liver Function Tests:  Recent Labs Lab 08/02/15 2102  AST 44*  ALT 23  ALKPHOS 87  BILITOT 0.5  PROT 6.3*  ALBUMIN 2.9*   No results for input(s): LIPASE, AMYLASE in the last 168 hours. No results for input(s): AMMONIA in the last 168 hours. CBC:  Recent Labs Lab 08/02/15 2102 08/03/15 0648 08/04/15 0550 08/06/15 0354  WBC 16.5* 26.5* 13.6* 9.6  NEUTROABS 16.0*  --   --   --   HGB 13.4 10.4* 8.6* 9.3*  HCT 39.9 32.8* 27.5* 28.1*  MCV 86.0 88.4 87.6 84.6  PLT 365 274 204 215   Cardiac Enzymes: No results for input(s): CKTOTAL, CKMB, CKMBINDEX, TROPONINI in the last 168 hours. BNP (last 3 results)  Recent Labs  01/17/15 0149  BNP 577.8*    ProBNP (last 3 results) No results for input(s): PROBNP in the last 8760 hours.  CBG:  Recent Labs Lab 08/05/15 1126  GLUCAP 174*    Micro Recent Results (from the past 240 hour(s))  Blood culture (routine x 2)     Status: None   Collection Time: 08/02/15  9:02 PM  Result Value Ref Range Status   Specimen Description BLOOD RIGHT FOREARM  Final   Special Requests BOTTLES DRAWN AEROBIC ONLY 5CC  Final   Culture  Setup Time   Final    GRAM NEGATIVE RODS AEROBIC BOTTLE ONLY CRITICAL RESULT CALLED TO, READ BACK BY AND VERIFIED WITH: C. ECKELMANN,RN AT 0840 ON [id number] BY S. YARBROUGH    Culture KLEBSIELLA PNEUMONIAE  Final   Report Status 08/06/2015 FINAL  Final  Blood culture (routine x 2)     Status: None   Collection Time: 08/02/15  9:54 PM    Result Value Ref Range Status   Specimen Description BLOOD RIGHT HAND  Final   Special Requests BOTTLES DRAWN AEROBIC AND ANAEROBIC 5CC  Final   Culture  Setup Time   Final    GRAM NEGATIVE RODS IN BOTH AEROBIC AND ANAEROBIC BOTTLES CRITICAL RESULT CALLED TO, READ BACK BY AND VERIFIED WITH: C. ECKELMANN,RN AT 1050 ON [id number] BY S. YARBROUGH    Culture KLEBSIELLA PNEUMONIAE  Final   Report Status 08/05/2015 FINAL  Final   Organism ID, Bacteria KLEBSIELLA PNEUMONIAE  Final      Susceptibility   Klebsiella pneumoniae - MIC*    AMPICILLIN >=32 RESISTANT Resistant     CEFAZOLIN <=4 SENSITIVE Sensitive     CEFEPIME <=1 SENSITIVE Sensitive     CEFTAZIDIME <=1 SENSITIVE Sensitive     CEFTRIAXONE <=1 SENSITIVE Sensitive     CIPROFLOXACIN <=0.25 SENSITIVE Sensitive     GENTAMICIN <=1 SENSITIVE Sensitive     IMIPENEM <=0.25 SENSITIVE Sensitive     TRIMETH/SULFA <=20 SENSITIVE Sensitive     AMPICILLIN/SULBACTAM 4 SENSITIVE Sensitive     PIP/TAZO <=4 SENSITIVE Sensitive     *  KLEBSIELLA PNEUMONIAE     Studies: No results found.  Scheduled Meds: . apixaban  2.5 mg Oral BID  . cefTRIAXone (ROCEPHIN)  IV  2 g Intravenous Q24H  . cholecalciferol  2,000 Units Oral Daily  . docusate sodium  100 mg Oral BID  . feeding supplement (ENSURE ENLIVE)  237 mL Oral BID BM  . mirabegron ER  25 mg Oral Daily  . mirtazapine  7.5 mg Oral QHS  . multivitamin with minerals  1 tablet Oral Daily  . predniSONE  10 mg Oral Q breakfast  . pyridostigmine  30 mg Oral TID  . senna  1 tablet Oral BID  . simvastatin  10 mg Oral Daily   Continuous Infusions: . sodium chloride 75 mL/hr at 08/05/15 1758       Time spent: 35 minutes    [facility] A  [facility] Pager [phone] If 7PM-7AM, please contact night-coverage at [email or web address], password [facility] 08/06/2015, 11:26 AM  LOS: 3 days

## 2015-08-06 NOTE — Care Management Important Message (Signed)
Important Message  Patient Details  Name: Seth [name] MRN: [id number] Date of Birth: 07-03-1925   Medicare Important Message Given:  Yes-second notification given    Orson AloeMegan P Aleeta Schmaltz 08/06/2015, 3:05 PM

## 2015-08-06 NOTE — Progress Notes (Signed)
Dear Doctor: This patient has been identified as a candidate for PICC for the following reason (s): drug pH or osmolality (causing phlebitis, infiltration in 24 hours) If you agree, please write an order for the indicated device. For any questions contact the Vascular Access Team at [phone] if no answer, please leave a message.  Thank you for supporting the early vascular access assessment program. Jenina Moening M  

## 2015-08-06 NOTE — Progress Notes (Signed)
Daily Progress Note   Patient Name: Seth [name]       Date: 08/06/2015 DOB: December 28, 1924  Age: 81 y.o. MRN#: [id number] Attending Physician: Clydia LlanoMutaz Elmahi, MD Primary Care Physician:  Duane Lopeoss, Alan, MD Admit Date: 08/02/2015  Reason for Consultation/Follow-up: Establishing goals of care and Pain control  Subjective: Much better. Denies pain. "wants to go home"  Interval Events: Admitted 10/14 with sepsis  Length of Stay: 3 days  Current Medications: Scheduled Meds:  . apixaban  2.5 mg Oral BID  . cefTRIAXone (ROCEPHIN)  IV  2 g Intravenous Q24H  . cholecalciferol  2,000 Units Oral Daily  . docusate sodium  100 mg Oral BID  . feeding supplement (ENSURE ENLIVE)  237 mL Oral BID BM  . mirabegron ER  25 mg Oral Daily  . mirtazapine  7.5 mg Oral QHS  . multivitamin with minerals  1 tablet Oral Daily  . predniSONE  10 mg Oral Q breakfast  . pyridostigmine  30 mg Oral TID  . senna  1 tablet Oral BID  . simvastatin  10 mg Oral Daily    Continuous Infusions: . sodium chloride 75 mL/hr at 08/06/15 1306    PRN Meds: acetaminophen **OR** acetaminophen, alum & mag hydroxide-simeth, morphine injection, ondansetron **OR** ondansetron (ZOFRAN) IV, oxyCODONE, polyethylene glycol  Palliative Performance Scale: 40%     Vital Signs: BP 136/77 mmHg  Pulse 90  Temp(Src) 98.5 F (36.9 C) (Oral)  Resp 18  Ht 5\' 10"  (1.778 m)  Wt 90.22 kg (198 lb 14.4 oz)  BMI 28.54 kg/m2  SpO2 96% SpO2: SpO2: 96 % O2 Device: O2 Device: Not Delivered O2 Flow Rate: O2 Flow Rate (L/min): 2 L/min  Intake/output summary:  Intake/Output Summary (Last 24 hours) at 08/06/15 1506 Last data filed at 08/06/15 0907  Gross per 24 hour  Intake 1821.25 ml  Output   2400 ml  Net -578.75 ml   LBM:   Baseline Weight: Weight: 71.668 kg (158 lb) Most recent weight: Weight: 90.22 kg (198 lb 14.4 oz)  Physical Exam: Chronically ill appearing, but pleasant and alert-talkative +no memory of event or his  condition +edema, no abdominal tenderness, SPC output good  Additional Data Reviewed: Recent Labs     08/04/15  0550  08/06/15  0354  WBC  13.6*  9.6  HGB  8.6*  9.3*  PLT  204  215  NA  136  138  BUN  12  14  CREATININE  0.97  0.93     Problem List:  Patient Active Problem List   Diagnosis Date Noted  . DNR (do not resuscitate) 08/06/2015  . Septic shock (HCC) 08/03/2015  . Hypotension 08/03/2015  . Leukocytosis 08/03/2015  . Urinary tract infection, site not specified 08/03/2015  . Palliative care encounter   . Acute renal failure superimposed on stage 3 chronic kidney disease (HCC) 07/13/2015  . Myasthenia gravis (HCC) 07/13/2015  . Acute encephalopathy 07/04/2015  . Fecal impaction (HCC) 07/04/2015  . Dementia 07/04/2015  . Pressure ulcer 07/04/2015  . AP (abdominal pain)   . Hypoxia   . Altered mental status   . Consciousness loss of 05/29/2015  . Vertigo, central origin 05/29/2015  . Dizziness and giddiness 05/29/2015  . Weakness 05/29/2015  . Cerebral brain hemorrhage (HCC) 05/29/2015  . TIA (transient ischemic attack) 05/29/2015  . Proximal limb muscle weakness 05/29/2015  . Ataxia 05/29/2015  . Syncope 03/31/2015  . CKD (chronic kidney disease), stage III 03/31/2015  . HTN (hypertension)  03/31/2015  . Atrial fibrillation (HCC) 03/31/2015  . Dyslipidemia 03/31/2015     Palliative Care Assessment & Plan    Code Status:  DNR  Goals of Care:  IV antibiotics, no surgical interventions  Rehab-attempt to regain mobility and independence  Family aware of hospice and palliative Care options  Symptom Management:  Maintain PRNs for pain  No additional recommendations  Palliative Prophylaxis:  Bowel regimen  Psycho-social/Spiritual:  Desire for further Chaplaincy support:no   Prognosis: Approaching or within his last year of life most likely.  Discharge Planning: Skilled Nursing Facility for rehab with Palliative care service  follow-up  Palliative Medicine Team will sign off given his improvement and that his goals of care have been established- please have palliative care see him at SNF and if he declines further consider hospice-his long term prognosis remains poor despite the fact that he appears to have gotten through this acute infection and illness.   Thank you for allowing the Palliative Medicine Team to assist in the care of this patient.   Time In: 2:45 Time Out: 3:15 Total Time 30 min Prolonged Time Billed  no     Greater than 50%  of this time was spent counseling and coordinating care related to the above assessment and plan.   Edsel Petrin, DO  08/06/2015, 3:06 PM  Please contact Palliative Medicine Team phone at [phone] for questions and concerns.

## 2015-08-07 DIAGNOSIS — G7000 Myasthenia gravis without (acute) exacerbation: Secondary | ICD-10-CM | POA: Diagnosis not present

## 2015-08-07 DIAGNOSIS — Z9181 History of falling: Secondary | ICD-10-CM | POA: Diagnosis not present

## 2015-08-07 DIAGNOSIS — R488 Other symbolic dysfunctions: Secondary | ICD-10-CM | POA: Diagnosis not present

## 2015-08-07 DIAGNOSIS — N401 Enlarged prostate with lower urinary tract symptoms: Secondary | ICD-10-CM | POA: Diagnosis not present

## 2015-08-07 DIAGNOSIS — Z515 Encounter for palliative care: Secondary | ICD-10-CM | POA: Diagnosis not present

## 2015-08-07 DIAGNOSIS — M6281 Muscle weakness (generalized): Secondary | ICD-10-CM | POA: Diagnosis not present

## 2015-08-07 DIAGNOSIS — N390 Urinary tract infection, site not specified: Secondary | ICD-10-CM

## 2015-08-07 DIAGNOSIS — R21 Rash and other nonspecific skin eruption: Secondary | ICD-10-CM | POA: Diagnosis not present

## 2015-08-07 DIAGNOSIS — G609 Hereditary and idiopathic neuropathy, unspecified: Secondary | ICD-10-CM | POA: Diagnosis not present

## 2015-08-07 DIAGNOSIS — A414 Sepsis due to anaerobes: Secondary | ICD-10-CM | POA: Diagnosis not present

## 2015-08-07 DIAGNOSIS — F4320 Adjustment disorder, unspecified: Secondary | ICD-10-CM | POA: Diagnosis not present

## 2015-08-07 DIAGNOSIS — R7881 Bacteremia: Secondary | ICD-10-CM | POA: Diagnosis not present

## 2015-08-07 DIAGNOSIS — G9340 Encephalopathy, unspecified: Secondary | ICD-10-CM | POA: Diagnosis not present

## 2015-08-07 DIAGNOSIS — R600 Localized edema: Secondary | ICD-10-CM | POA: Diagnosis not present

## 2015-08-07 DIAGNOSIS — I4891 Unspecified atrial fibrillation: Secondary | ICD-10-CM | POA: Diagnosis not present

## 2015-08-07 DIAGNOSIS — R531 Weakness: Secondary | ICD-10-CM | POA: Diagnosis not present

## 2015-08-07 DIAGNOSIS — T83510D Infection and inflammatory reaction due to cystostomy catheter, subsequent encounter: Secondary | ICD-10-CM | POA: Diagnosis not present

## 2015-08-07 DIAGNOSIS — I482 Chronic atrial fibrillation: Secondary | ICD-10-CM

## 2015-08-07 DIAGNOSIS — R6521 Severe sepsis with septic shock: Secondary | ICD-10-CM

## 2015-08-07 DIAGNOSIS — A419 Sepsis, unspecified organism: Secondary | ICD-10-CM | POA: Diagnosis not present

## 2015-08-07 DIAGNOSIS — I350 Nonrheumatic aortic (valve) stenosis: Secondary | ICD-10-CM | POA: Diagnosis not present

## 2015-08-07 DIAGNOSIS — R3912 Poor urinary stream: Secondary | ICD-10-CM | POA: Diagnosis not present

## 2015-08-07 DIAGNOSIS — R262 Difficulty in walking, not elsewhere classified: Secondary | ICD-10-CM | POA: Diagnosis not present

## 2015-08-07 DIAGNOSIS — B961 Klebsiella pneumoniae [K. pneumoniae] as the cause of diseases classified elsewhere: Secondary | ICD-10-CM | POA: Diagnosis not present

## 2015-08-07 DIAGNOSIS — I129 Hypertensive chronic kidney disease with stage 1 through stage 4 chronic kidney disease, or unspecified chronic kidney disease: Secondary | ICD-10-CM | POA: Diagnosis not present

## 2015-08-07 DIAGNOSIS — M1990 Unspecified osteoarthritis, unspecified site: Secondary | ICD-10-CM | POA: Diagnosis not present

## 2015-08-07 DIAGNOSIS — M25531 Pain in right wrist: Secondary | ICD-10-CM | POA: Diagnosis not present

## 2015-08-07 DIAGNOSIS — I96 Gangrene, not elsewhere classified: Secondary | ICD-10-CM | POA: Diagnosis not present

## 2015-08-07 DIAGNOSIS — N183 Chronic kidney disease, stage 3 (moderate): Secondary | ICD-10-CM | POA: Diagnosis not present

## 2015-08-07 DIAGNOSIS — H02402 Unspecified ptosis of left eyelid: Secondary | ICD-10-CM | POA: Diagnosis not present

## 2015-08-07 HISTORY — DX: Sepsis due to anaerobes: A41.4

## 2015-08-07 HISTORY — DX: Klebsiella pneumoniae (k. pneumoniae) as the cause of diseases classified elsewhere: B96.1

## 2015-08-07 HISTORY — DX: Bacteremia: R78.81

## 2015-08-07 MED ORDER — CIPROFLOXACIN HCL 250 MG PO TABS: 250.0000 mg | ORAL_TABLET | Freq: Two times a day (BID) | ORAL | Status: DC

## 2015-08-07 MED ORDER — TRAMADOL HCL 50 MG PO TABS
50.0000 mg | ORAL_TABLET | Freq: Four times a day (QID) | ORAL | Status: DC | PRN
Start: 2015-08-07 — End: 2016-03-04

## 2015-08-07 NOTE — Progress Notes (Signed)
UR Completed Girtha Kilgore Graves-Bigelow, RN,BSN [phone]  

## 2015-08-07 NOTE — Clinical Social Work Note (Signed)
Clinical Social Work Assessment  Patient Details  Name: Seth [name] MRN: [id number] Date of Birth: 18-Feb-1925  Date of referral:  08/07/15               Reason for consult:  Facility Placement                Permission sought to share information with:    Permission granted to share information::  Yes, Verbal Permission Granted  Name::     Seth [name] and [name]   Agency::     Relationship:: Daughter-in-law and son of patient.   Contact Information:  [phone]  Housing/Transportation Living arrangements for the past 2 months:  Skilled Nursing Facility (Admitted to Seth [name]) Source of Information:  Patient Patient Interpreter Needed:  None Criminal Activity/Legal Involvement Pertinent to Current Situation/Hospitalization:  No - Comment as needed Significant Relationships:  Adult Children Lives with:  Relatives Do you feel safe going back to the place where you live?  Yes Need for family participation in patient care:  Yes (Comment)  Care giving concerns:  None expressed by patient.   Social Worker assessment / plan:  CSW visited room and talked with patient about discharge plans. Patient was sitting in a chair at bedside and was alert, oriented and agreeable to talking with CSW. Patient aware that he is ready for discharge today and plans to return to [facility] [name] to continue his rehab.  When asked, Mr. Seth [name] indicated that Seth [name] would be the contact person.  Employment status:  Retired Engineer, miningnsurance information:  Medicare, Managed Care PT Recommendations:  Skilled Nursing Facility Information / Referral to community resources:  Other (Comment Required) (None needed or requested at this time)  Patient/Family's Response to care:  No concerns expressed.  Patient/Family's Understanding of and Emotional Response to Diagnosis, Current Treatment, and Prognosis:  Not discussed  Emotional Assessment Appearance:  Appears stated  age Attitude/Demeanor/Rapport:  Other (Appropriate) Affect (typically observed):  Calm, Appropriate Orientation:  Oriented to Self, Oriented to Place, Oriented to  Time, Oriented to Situation Alcohol / Substance use:  Tobacco Use (Patient reported that he quit smoking and does not use alcohol or illicit drugs.) Psych involvement (Current and /or in the community):  No (Comment)  Discharge Needs  Concerns to be addressed:  No discharge needs identified Readmission within the last 30 days:  Yes Current discharge risk:  None Barriers to Discharge:  No Barriers Identified   Cristobal GoldmannCrawford, Juno Alers Bradley, LCSW 08/07/2015, 12:06 PM

## 2015-08-07 NOTE — Care Management Note (Addendum)
Case Management Note  Patient Details  Name: Seth [name] MRN: [id number] Date of Birth: [birth year]  Subjective/Objective:   Pt admitted for Septic Shock. Plan for d/c to [facility] 08-07-15.             Action/Plan: CSW assisting with disposition needs. No needs from CM at this time.    Expected Discharge Date:                  Expected Discharge Plan:  Skilled Nursing Facility  In-House Referral:  Clinical Social Work  Discharge planning Services  CM Consult  Post Acute Care Choice:  NA Choice offered to:  NA  DME Arranged:  N/A DME Agency:  NA  HH Arranged:  NA HH Agency:  NA  Status of Service:     Medicare Important Message Given:  Yes-second notification given Date Medicare IM Given:    Medicare IM give by:    Date Additional Medicare IM Given:    Additional Medicare Important Message give by:     If discussed at Long Length of Stay Meetings, dates discussed:    Additional Comments:  Gala LewandowskyGraves-Bigelow, Kaleigh Spiegelman Kaye, RN 08/07/2015, 11:07 AM

## 2015-08-07 NOTE — Clinical Social Work Note (Signed)
Patient medically stable for discharge back to [facility] skilled facility today. Discharge information transmitted to facility and ambulance transport ([facility]) called. Daughter-in-law Henry Russelllen Dubas contacted to inform of d/c and that transport called.   Genelle BalVanessa Amore Grater, MSW, LCSW Licensed Clinical Social Worker Clinical Social Work Department [facility] [phone]

## 2015-08-07 NOTE — Discharge Summary (Signed)
Physician Discharge Summary  Seth [name] GNF:[id number] DOB: 1925-08-15 DOA: 08/02/2015  PCP:  Duane Lope, MD  Admit date: 08/02/2015 Discharge date: 08/07/2015  Time spent: 35 minutes  Recommendations for Outpatient Follow-up:  1. Discharge back to [facility] from skilled nursing facility. 2. Patient will complete a total 2 weeks course of antibiotic on 08/16/2015. 3. Patient has a follow-up with his urologist in few weeks.  Discharge Diagnoses:  Principal Problem:   Septic shock (HCC)   Active Problems:    Bacteremia due to Klebsiella pneumoniae   Sepsis due to Klebsiella pneumoniae (HCC)   CKD (chronic kidney disease), stage III   Atrial fibrillation (HCC)   Dyslipidemia   Myasthenia gravis (HCC)   Hypotension   Leukocytosis   Urinary tract infection, site not specified   Palliative care encounter   DNR (do not resuscitate)    Discharge Condition: Fair  Diet recommendation: Regular  CODE STATUS: DO NOT RESUSCITATE   Filed Weights   08/04/15 2002 08/05/15 2100 08/06/15 2147  Weight: 89.948 kg (198 lb 4.8 oz) 90.22 kg (198 lb 14.4 oz) 90.175 kg (198 lb 12.8 oz)    History of present illness:  Please refer to admission H&P for details, in brief, 81 year old elderly male resident of [facility] nursing home, with medical history significant for myasthenia gravis, CK D stage III, chronic suprapubic indwelling catheter, A. fib on eliquis, aortic stenosis, essential hypertension, recurrent UTIs who was sent from the nursing home for hematuria and passage of large clots from his suprapubic catheter. The catheter was changed during earlier during the day and in the afternoon he had fevers with chills. In the ED had a temperature of 102.6 Fahrenheit, hypotensive and tachycardic meeting criteria for septic shock with initial workup suggestive of UTI. Sepsis pathway was initiated in the ED. CT scan done showed gas in the perineum consistent with possible Fournier gangrene and UTI  brought to the hospital because of lethargy. He was found to have hypertension, febrile and signs consistent with sepsis. CT scan was done in the emergency department and showed gas in the perineum probably consistent with Fournier gangrene. Patient placed on empiric vancomycin and Zosyn. Blood culture positive for Klebsiella pneumoniae bacteremia. Patient was on Zosyn and vancomycin, after the blood culture showed Klebsiella antibiotics switched to Rocephin. . Patient seen by urology and palliative care team . Patient and family agreed that he is poor candidate for any kind of surgical or aggressive intervention and recommended to continue antibiotics.  Hospital Course:  Septic shock -Patient presented with fever of 102.60 Fahrenheit, WBC of 20 6.5K with hypotension and tachycardia in the setting of UTI and perineal infection. Lactic acid was elevated to 4.4 with  procalcitonin of 95.8. Sepsis pathway initiated in the ED with aggressive IV hydration and empiric antibiotic coverage. Patient Blood cultures positive for Klebsiella pneumonia and antibiotic narrowed to IV Rocephin. -Sepsis has resolved and Patient remains afebrile for at least 48 hours and leukocytosis has now normalized. -He will be discharged on oral ciprofloxacin based on sensitivity to complete a total 2 weeks course of antibiotic.  Perineal infection -CT scan of pelvis showed gas and inflammation in the perineal area/base of the penis, Likely for Fournier's gangrene. Seen by urology who recommended against any surgical intervention given  multiple comorbidities and 81 advanced age. -This was discussed with family who also agreed against any aggressive intervention. Patient responding well to IV antibiotics with resolution of his fever and leukocytosis. -Will be discharged on oral  ciprofloxacin. He will follow-up with his urologist as outpatient. (Family informed that he has an appointment in next few weeks)  UTI Complicated UTI  associated with suprapubic indwelling catheter. Urine culture not sent this admission. Covering with empiric antibiotic. Family reports multiple UTIs in the past few months. Continue suprapubic catheter with catheter change as scheduled. Will follow-up with his urologist as outpatient.  Myasthenia gravis Continue home medications. No issues  CKD stage III Stable.  A. fib Rate controlled on metoprolol. Continue eliquis  Protein calorie malnutrition Continue supplement.  Disposition: Return to skilled nursing facility  Family communication: Daughter-in-law at bedside   Procedures:  CT of the pelvis  Consultations:  Urology Cleda Mccreedy)  Palliative care  Discharge Exam: Filed Vitals:   08/07/15 0500  BP: 150/64  Pulse: 81  Temp: 97.5 F (36.4 C)  Resp: 18    General: Elderly male in no acute distress HEENT: No pallor, moist oral mucosa, supple neck Chest: Clear to auscultation bilaterally, no added sounds CVS: Normal S1 and S2, no murmurs rub or gallop GI: Soft, nondistended, nontender, suprapubic catheter in place, no scrotal or perineal tenderness Musculoskeletal: Warm, no edema CNS: Alert and oriented   Discharge Instructions    Current Discharge Medication List    START taking these medications   Details  ciprofloxacin (CIPRO) 250 MG tablet Take 1 tablet (250 mg total) by mouth 2 (two) times daily. Qty: 20 tablet, Refills: 0      CONTINUE these medications which have CHANGED   Details  traMADol (ULTRAM) 50 MG tablet Take 1 tablet (50 mg total) by mouth every 6 (six) hours as needed for moderate pain. Qty: 20 tablet, Refills: 0      CONTINUE these medications which have NOT CHANGED   Details  acetaminophen (TYLENOL) 500 MG tablet Take 500 mg by mouth every 6 (six) hours as needed for mild pain.     apixaban (ELIQUIS) 2.5 MG TABS tablet Take 2.5 mg by mouth 2 (two) times daily.    Cholecalciferol (VITAMIN D3) 2000 UNITS TABS Take 2,000  Units by mouth daily.     docusate sodium (COLACE) 100 MG capsule Take 1 capsule (100 mg total) by mouth 2 (two) times daily. Qty: 10 capsule, Refills: 0    Emollient (CETAPHIL) cream Apply 1 application topically as needed (for feet).    feeding supplement, ENSURE ENLIVE, (ENSURE ENLIVE) LIQD Take 237 mLs by mouth 2 (two) times daily between meals. Qty: 237 mL, Refills: 12    metoprolol succinate (TOPROL-XL) 50 MG 24 hr tablet Take 50 mg by mouth every morning. Take with or immediately following a meal.    mirtazapine (REMERON) 7.5 MG tablet Take 7.5 mg by mouth at bedtime.    Multiple Vitamin (MULTIVITAMIN WITH MINERALS) TABS tablet Take 1 tablet by mouth daily.    MYRBETRIQ 25 MG TB24 tablet Take 25 mg by mouth daily. Refills: 1    polyethylene glycol (MIRALAX / GLYCOLAX) packet Take 17 g by mouth daily as needed for moderate constipation. Qty: 14 each, Refills: 0    predniSONE (DELTASONE) 10 MG tablet Take 1 tablet (10 mg total) by mouth daily with breakfast. Qty: 90 tablet, Refills: mg   Associated Diagnoses: Myasthenia gravis (HCC)    pyridostigmine (MESTINON) 60 MG tablet Take 1 tablet (60 mg total) by mouth 3 (three) times daily. Qty: 90 tablet, Refills: 6    senna (SENOKOT) 8.6 MG TABS tablet Take 1 tablet by mouth 2 (two) times daily.  simvastatin (ZOCOR) 10 MG tablet Take 10 mg by mouth daily.        Allergies  Allergen Reactions  . Ceftin [Cefuroxime] Diarrhea    Severe diarrhea   Follow-up Information    Please follow up.   Why:  MD at SNF       The results of significant diagnostics from this hospitalization (including imaging, microbiology, ancillary and laboratory) are listed below for reference.    Significant Diagnostic Studies: Ct Abdomen Pelvis Wo Contrast  08/03/2015  CLINICAL DATA:  Fever, urinary tract infection, hematuria. Blood clots noted from suprapubic catheter during catheter change. Vomiting. EXAM: CT ABDOMEN AND PELVIS WITHOUT  CONTRAST TECHNIQUE: Multidetector CT imaging of the abdomen and pelvis was performed following the standard protocol without IV contrast. COMPARISON:  07/03/2015 FINDINGS: Atelectasis in the lung bases. Calcified lymph nodes in the subcarinal region. Coronary artery calcifications. Cholelithiasis with multiple stones in the dependent portion of the gallbladder. No gallbladder wall thickening or inflammatory reaction. Unenhanced appearance of the liver, spleen, pancreas, adrenal glands, inferior vena cava, and retroperitoneal lymph nodes is unremarkable. Calcification of the abdominal aorta without aneurysm. Calcification possibly with aneurysm of the celiac axis origin. Kidneys demonstrate mild parenchymal scarring. No hydronephrosis or hydroureter. No renal or ureteral stones are demonstrated. Stomach, small bowel, and colon are not abnormally distended. No free air or free fluid in the abdomen. Abdominal wall musculature appears intact. Pelvis: Suprapubic catheter. Balloon is in the bladder neck. Diffuse thickening of the wall of the bladder suggesting cystitis. Increased density of the urine consistent with hemorrhage. Small stone in the dependent portion of the bladder. Gas in the bladder is likely post instrumentation. Infiltration in the subcutaneous fat over the dome of the bladder is likely postoperative scarring or infiltration. Marked enlargement of the prostate gland with transverse measurement of 6.7 cm. Gas and infiltrative stranding demonstrated in the base of the penis and in the perineal tissues extending into the left inguinal region and left hemiscrotum. This likely indicates infection with gas-forming organism. Fistula not excluded. Degenerative changes in the lumbar spine with slight anterior subluxation of L4 on L5. Irregular lucency and sclerosis throughout the sacrum and in the right iliac bone. This may be due to osteoporosis or Paget's disease. IMPRESSION: Suprapubic catheter in the bladder.  Bladder wall thickening suggesting cystitis. Small stone in the dependent portion of the bladder. Prominent prostate gland enlargement. Gas and inflammatory infiltration in the soft tissues at the base of the penis, the left perianal/perineal region, left groin, and left scrotum. Changes suggest infection with gas-forming organism. Cholelithiasis. Atelectasis in the lung bases. Changes of Paget's disease versus osteoporosis in the sacrum and pelvis. Aneurysm of the celiac axis. These results were called by telephone at the time of interpretation on 08/03/2015 at 12:50 am to Dr. Patria Mane, who verbally acknowledged these results. Electronically Signed   By: Burman Nieves M.D.   On: 08/03/2015 00:54   Dg Chest Portable 1 View  08/02/2015  CLINICAL DATA:  Fever, cough EXAM: PORTABLE CHEST 1 VIEW COMPARISON:  07/03/2015 FINDINGS: Cardiomediastinal silhouette is stable. Mild elevation of the left hemidiaphragm again noted. No acute infiltrate or pleural effusion. No pulmonary edema. Mild basilar atelectasis. IMPRESSION: No active disease. Mild elevation of left hemidiaphragm with basilar atelectasis. Electronically Signed   By: Natasha Mead M.D.   On: 08/02/2015 21:45    Microbiology: Recent Results (from the past 240 hour(s))  Blood culture (routine x 2)     Status: None   Collection  Time: 08/02/15  9:02 PM  Result Value Ref Range Status   Specimen Description BLOOD RIGHT FOREARM  Final   Special Requests BOTTLES DRAWN AEROBIC ONLY 5CC  Final   Culture  Setup Time   Final    GRAM NEGATIVE RODS AEROBIC BOTTLE ONLY CRITICAL RESULT CALLED TO, READ BACK BY AND VERIFIED WITH: C. ECKELMANN,RN AT 0840 ON 101516 BY S. YARBROUGH    Culture KLEBSIELLA PNEUMONIAE  Final   Report Status 08/06/2015 FINAL  Final  Blood culture (routine x 2)     Status: None   Collection Time: 08/02/15  9:54 PM  Result Value Ref Range Status   Specimen Description BLOOD RIGHT HAND  Final   Special Requests BOTTLES DRAWN AEROBIC  AND ANAEROBIC 5CC  Final   Culture  Setup Time   Final    GRAM NEGATIVE RODS IN BOTH AEROBIC AND ANAEROBIC BOTTLES CRITICAL RESULT CALLED TO, READ BACK BY AND VERIFIED WITH: C. ECKELMANN,RN AT 1050 ON 101516 BY S. YARBROUGH    Culture KLEBSIELLA PNEUMONIAE  Final   Report Status 08/05/2015 FINAL  Final   Organism ID, Bacteria KLEBSIELLA PNEUMONIAE  Final      Susceptibility   Klebsiella pneumoniae - MIC*    AMPICILLIN >=32 RESISTANT Resistant     CEFAZOLIN <=4 SENSITIVE Sensitive     CEFEPIME <=1 SENSITIVE Sensitive     CEFTAZIDIME <=1 SENSITIVE Sensitive     CEFTRIAXONE <=1 SENSITIVE Sensitive     CIPROFLOXACIN <=0.25 SENSITIVE Sensitive     GENTAMICIN <=1 SENSITIVE Sensitive     IMIPENEM <=0.25 SENSITIVE Sensitive     TRIMETH/SULFA <=20 SENSITIVE Sensitive     AMPICILLIN/SULBACTAM 4 SENSITIVE Sensitive     PIP/TAZO <=4 SENSITIVE Sensitive     * KLEBSIELLA PNEUMONIAE     Labs: Basic Metabolic Panel:  Recent Labs Lab 08/02/15 2102 08/03/15 0648 08/04/15 0550 08/06/15 0354  NA 141 139 136 138  K 3.6 4.6 3.8 3.8  CL 106 109 107 104  CO2 23 21* 23 26  GLUCOSE 206* 121* 111* 121*  BUN 23* 19 12 14   CREATININE 1.37* 1.31* 0.97 0.93  CALCIUM 8.9 7.3* 7.8* 8.4*   Liver Function Tests:  Recent Labs Lab 08/02/15 2102  AST 44*  ALT 23  ALKPHOS 87  BILITOT 0.5  PROT 6.3*  ALBUMIN 2.9*   No results for input(s): LIPASE, AMYLASE in the last 168 hours. No results for input(s): AMMONIA in the last 168 hours. CBC:  Recent Labs Lab 08/02/15 2102 08/03/15 0648 08/04/15 0550 08/06/15 0354  WBC 16.5* 26.5* 13.6* 9.6  NEUTROABS 16.0*  --   --   --   HGB 13.4 10.4* 8.6* 9.3*  HCT 39.9 32.8* 27.5* 28.1*  MCV 86.0 88.4 87.6 84.6  PLT 365 274 204 215   Cardiac Enzymes: No results for input(s): CKTOTAL, CKMB, CKMBINDEX, TROPONINI in the last 168 hours. BNP: BNP (last 3 results)  Recent Labs  01/17/15 0149  BNP 577.8*    ProBNP (last 3 results) No results for  input(s): PROBNP in the last 8760 hours.  CBG:  Recent Labs Lab 08/05/15 1126  GLUCAP 174*       Signed:  Damika Harmon  [facility] 08/07/2015, 10:23 AM

## 2015-08-07 NOTE — Progress Notes (Signed)
Called to give report to staff of [facility]; report given to Nurse [employer]. Understanding verbalized. IV removed without issue. Pt to leave unit via stretcher by ambulance service.

## 2015-08-08 ENCOUNTER — Non-Acute Institutional Stay (SKILLED_NURSING_FACILITY): Payer: Medicare Other | Admitting: Internal Medicine

## 2015-08-08 DIAGNOSIS — I482 Chronic atrial fibrillation, unspecified: Secondary | ICD-10-CM

## 2015-08-08 DIAGNOSIS — R6521 Severe sepsis with septic shock: Secondary | ICD-10-CM

## 2015-08-08 DIAGNOSIS — A419 Sepsis, unspecified organism: Secondary | ICD-10-CM

## 2015-08-08 DIAGNOSIS — R7881 Bacteremia: Secondary | ICD-10-CM

## 2015-08-08 DIAGNOSIS — G7000 Myasthenia gravis without (acute) exacerbation: Secondary | ICD-10-CM | POA: Diagnosis not present

## 2015-08-08 DIAGNOSIS — B961 Klebsiella pneumoniae [K. pneumoniae] as the cause of diseases classified elsewhere: Secondary | ICD-10-CM

## 2015-08-08 NOTE — Progress Notes (Signed)
Patient ID: Seth [name], male   DOB: 1925/09/20, 81 y.o.   MRN: [id number] MRN: [id number] Name: Seth [name]  Sex: male Age: 81 y.o. DOB: 02-01-1925  [facility] #: [facility] Facility/Room:111 Level Of Care: SNF Provider: Roena Malady Emergency Contacts: Extended Emergency Contact Information Primary Emergency Contact: Madding,David & Neoma Laming [place] Home Phone: [phone] Mobile Phone: [phone] Relation: Son Secondary Emergency Contact: Richardean Chimera [place] Home Phone: [phone] Relation: None  Code Status:   Allergies: Ceftin  Chief Complaint  Patient presents with  . Acute Visit    HPI: Patient is 81 y.o. male with history of Recently diagnosed Myastenia Gravis, AS, Atrial fibrillaltion HTN and Hyperlipidemia  He was sent to [facility] ER for hematuria with large clots from his suprapubic catheter-this was accompanied with fever and chills-he was hypotensive and tachycardic as well and thought to be septic.  CT scan showed gas in the 90 consistent with possible  Fournier gangrene-.  He was placed on a periodic vancomycin and Zosyn-blood culture was positive for Klebsiella pneumonia bacteremia.  With this result his antibiotic was switched to Rocephin.  He was seen by urology and palliative care team as well-family agree he was a poor candidate for any kind of surgical or aggressive intervention and recommended to discontinue antibiotics with no aggressive treatment be on that.  He has been discharged on ciprofloxacin with no plans for surgery family would appreciate a palliative care consult apparently they did meet with palliative care in the hospital as well.  Patient's other conditions including chronic kidney disease stage III-atrial fibrillation and myasthenia gravis were apparently stable during this hospitalization.  Previous medical history.  Septic shock secondary to bacteremia due to Klebsiella pneumonia.  Chronic kidney  disease stage III.  -Fibrillation.  Dyslipidemia.  Myasthenia gravis.  Hypotension.  Leukocytosis.  UTI.  Family medical social history as been reviewed per admission history and physical on 08/03/2015  Past Medical History  Diagnosis Date  . Hypertension   . Hyperlipidemia   . Aortic stenosis   . High cholesterol   . A-fib (HCC)   . History of TIAs   . Multiple falls   . Urinary incontinence   . Dementia     short term memory loss    Past Surgical History  Procedure Laterality Date  . Hernia repair      1970s  . Back surgery  2006  . Cervical laminectomy  2002 ?  [name] Insertion of suprapubic catheter N/A 06/26/2015    Procedure: INSERTION OF SUPRAPUBIC CATHETER;  Surgeon: Malen Gauze, MD;  Location: [facility];  Service: Urology;  Laterality: N/A;  . Cystoscopy N/A 06/26/2015    Procedure: CYSTOSCOPY;  Surgeon: Malen Gauze, MD;  Location: [facility];  Service: Urology;  Laterality: N/A;      Medication List       This list is accurate as of: 08/08/15 11:59 PM.  Always use your most recent med list.               acetaminophen 500 MG tablet  Commonly known as:  TYLENOL  Take 500 mg by mouth every 6 (six) hours as needed for mild pain.     cetaphil cream  Apply 1 application topically as needed (for feet).     ciprofloxacin 250 MG tablet  Commonly known as:  CIPRO  Take 1 tablet (250 mg total) by mouth 2 (two) times daily.     docusate sodium  100 MG capsule  Commonly known as:  COLACE  Take 1 capsule (100 mg total) by mouth 2 (two) times daily.     ELIQUIS 2.5 MG Tabs tablet  Generic drug:  apixaban  Take 2.5 mg by mouth 2 (two) times daily.     feeding supplement (ENSURE ENLIVE) Liqd  Take 237 mLs by mouth 2 (two) times daily between meals.     metoprolol succinate 50 MG 24 hr tablet  Commonly known as:  TOPROL-XL  Take 50 mg by mouth every morning. Take with or immediately following a meal.     mirtazapine 7.5 MG tablet  Commonly known  as:  REMERON  Take 7.5 mg by mouth at bedtime.     multivitamin with minerals Tabs tablet  Take 1 tablet by mouth daily.     MYRBETRIQ 25 MG Tb24 tablet  Generic drug:  mirabegron ER  Take 25 mg by mouth daily.     polyethylene glycol packet  Commonly known as:  MIRALAX / GLYCOLAX  Take 17 g by mouth daily as needed for moderate constipation.     predniSONE 10 MG tablet  Commonly known as:  DELTASONE  Take 1 tablet (10 mg total) by mouth daily with breakfast.     pyridostigmine 60 MG tablet  Commonly known as:  MESTINON  Take 1 tablet (60 mg total) by mouth 3 (three) times daily.     senna 8.6 MG Tabs tablet  Commonly known as:  SENOKOT  Take 1 tablet by mouth 2 (two) times daily.     simvastatin 10 MG tablet  Commonly known as:  ZOCOR  Take 10 mg by mouth daily.     traMADol 50 MG tablet  Commonly known as:  ULTRAM  Take 1 tablet (50 mg total) by mouth every 6 (six) hours as needed for moderate pain.     Vitamin D3 2000 UNITS Tabs  Take 2,000 Units by mouth daily.        No orders of the defined types were placed in this encounter.    Immunization History  Administered Date(s) Administered  . Influenza,inj,Quad PF,36+ Mos 07/05/2015  . Pneumococcal Polysaccharide-23 07/05/2015  . Tdap 03/31/2015    Social History  Substance Use Topics  . Smoking status: Former Games developer  . Smokeless tobacco: Never Used  . Alcohol Use: No    Family history is + prostate CA    Review of Systems  DATA OBTAINED: from patient, nurse, medical record, daughter. GENERAL:  no fevers, fatigue, appetite changes;  SKIN: No itching,  Has resolving  rash --no  wounds EYES: No eye pain, redness, discharge EARS: No earache, tinnitus, change in hearing NOSE: No congestion, drainage or bleeding  MOUTH/THROAT: No mouth or tooth pain, No sore throat RESPIRATORY: No cough, wheezing, SOB CARDIAC: No chest pain, palpitations, lower extremity edema  GI: No abdominal pain, No N/V/D or  constipation, No heartburn or reflux  GU: No dysuria, frequency or urgency, or incontinence  MUSCULOSKELETAL: No unrelieved bone/joint pain NEUROLOGIC: No headache, dizziness or focal weakness PSYCHIATRIC: No c/o anxiety or sadness   Filed Vitals:   08/17/15 1716  BP: 146/81  Pulse: 90  Temp: 97.2 F (36.2 C)  Resp: 18    SpO2 Readings from Last 1 Encounters:  08/07/15 98%        Physical Exam  GENERAL APPEARANCE: Elderly male who is pleasant and conversant,  No acute distress.  SKIN: No diaphoresis does have an erythematous rash on his back this  is improving per family HEAD: Normocephalic, atraumatic  EYES: Conjunctiva/lids clear. Pupils round, reactive. EOMs intact has prescription lenses.  EARS: External exam WNL, canals clear. Hearing grossly normal.  NOSE: No deformity or discharge.  MOUTH/THROAT: Oropharynx is clear mucous membranes moist  RESPIRATORY: Breathing is even, unlabored. Lung sounds are clear--shallow   CARDIOVASCULAR: Heart RRR no murmurs, rubs or gallops. No peripheral edema.   GASTROINTESTINAL: Abdomen is soft, non-tender, not distended w/ normal bowel sounds. GENITOURINARY: Bladder non tender, not distended catheter is draining amber colored urine  MUSCULOSKELETAL: No abnormal joints or musculature NEUROLOGIC:  Cranial nerves 2-12 grossly intact. Moves all extremities;  PSYCHIATRIC: Mood and affect appropriate to situation now with dementia, he is pleasant and cooperative  Patient Active Problem List   Diagnosis Date Noted  . 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 Bacteremia due to Klebsiella pneumoniae 08/07/2015  . Sepsis due to Klebsiella pneumoniae (HCC) 08/07/2015  . DNR (do not resuscitate) 08/06/2015  . Septic shock (HCC) 08/03/2015  . Hypotension 08/03/2015  . Leukocytosis 08/03/2015  . Catheter-associated urinary tract infection (HCC) 08/03/2015  . Palliative care encounter   . Acute renal failure superimposed on stage 3 chronic kidney  disease (HCC) 07/13/2015  . Myasthenia gravis (HCC) 07/13/2015  . Acute encephalopathy 07/04/2015  . Fecal impaction (HCC) 07/04/2015  . Dementia 07/04/2015  . Pressure ulcer 07/04/2015  . AP (abdominal pain)   . Hypoxia   . Altered mental status   . Consciousness loss of 05/29/2015  . Vertigo, central origin 05/29/2015  . Dizziness and giddiness 05/29/2015  . Weakness 05/29/2015  . Cerebral brain hemorrhage (HCC) 05/29/2015  . TIA (transient ischemic attack) 05/29/2015  . Proximal limb muscle weakness 05/29/2015  . Ataxia 05/29/2015  . Syncope 03/31/2015  . CKD (chronic kidney disease), stage III 03/31/2015  . HTN (hypertension) 03/31/2015  . Atrial fibrillation (HCC) 03/31/2015  . Dyslipidemia 03/31/2015    CBC    Component Value Date/Time   WBC 9.6 08/06/2015 0354   RBC 3.32* 08/06/2015 0354   HGB 9.3* 08/06/2015 0354   HCT 28.1* 08/06/2015 0354   PLT 215 08/06/2015 0354   MCV 84.6 08/06/2015 0354   LYMPHSABS 0.3* 08/02/2015 2102   MONOABS 0.2 08/02/2015 2102   EOSABS 0.1 08/02/2015 2102   BASOSABS 0.0 08/02/2015 2102    CMP     Component Value Date/Time   NA 138 08/06/2015 0354   NA 140 05/29/2015 1319   K 3.8 08/06/2015 0354   CL 104 08/06/2015 0354   CO2 26 08/06/2015 0354   GLUCOSE 121* 08/06/2015 0354   GLUCOSE 152* 05/29/2015 1319   BUN 14 08/06/2015 0354   BUN 22 05/29/2015 1319   CREATININE 0.93 08/06/2015 0354   CALCIUM 8.4* 08/06/2015 0354   PROT 6.3* 08/02/2015 2102   PROT 7.7 05/29/2015 1319   ALBUMIN 2.9* 08/02/2015 2102   ALBUMIN 4.0 05/29/2015 1319   AST 44* 08/02/2015 2102   ALT 23 08/02/2015 2102   ALKPHOS 87 08/02/2015 2102   BILITOT 0.5 08/02/2015 2102   BILITOT 1.2 05/29/2015 1319   GFRNONAA >60 08/06/2015 0354   GFRAA >60 08/06/2015 0354               Assessment and Plan  #1 history of septic shock-this was treated aggressively with antibiotics he has been discharged on Cipro-felt to be a complicated UTI he also had  perineal infection showed gas and inflammation likely gangrene-urology did not recommend surgical intervention and family agrees with this-blood cultures also  were positive for Klebsiella pneumonia and at one point received IV Rocephin again he has been discharged on ciprofloxacin.  Sepsis is resolved and family would like palliative care to follow-up here.  Patient does have urology follow-up scheduled.--He does continue with a catheter Myrbetriq  History of myasthenia gravis-this is stable on current medications including prednisone 10 mg a day and Mestinon.--  #3 history of atrial fibrillation this appears to be rate controlled on metoprolol is anticoagulated on Eliquis  #4-history of chronic kidney disease stage III this appears stable with most recent creatinine on 08/06/2015 of 0.93 BUN of 14 will update a basic metabolic panel next lab day.  #5 history of anemia suspect chronic disease hemoglobin most recently 9.3 on October 18 will update this next week as well appears in the hospital recent hemoglobins were more in the 8-10 range.  #6 history constipation he is on numerous agents including Colace and MiraLAX and senna at this point appears to be stable  #7 history of back rash-this appears to be resolving on topical treatment.  [id number] note greater than 35 minutes spent assessing patient-discussing his status with nursing staff as well as with his family in the room- and coordinating and formulating a plan of care-of note greater than 50% of time spent coordinating plan of care     Else Habermann C,

## 2015-08-09 DIAGNOSIS — R531 Weakness: Secondary | ICD-10-CM | POA: Diagnosis not present

## 2015-08-12 ENCOUNTER — Telehealth: Payer: Self-pay | Admitting: Neurology

## 2015-08-12 NOTE — Telephone Encounter (Signed)
Called daughter to inquire about her dad and see how he is doing, we started a very low-dose daily steroid and I am calling to see what his response has been. He did not come to his last appointment as he was in the hospital. He is scheduled to see Dr. Allena KatzPatel in a few weeks. Can increase his daily steroid for possible myasthenia gravis. Will try calling again tomorrow. Thanks.

## 2015-08-13 ENCOUNTER — Non-Acute Institutional Stay (SKILLED_NURSING_FACILITY): Payer: Medicare Other | Admitting: Internal Medicine

## 2015-08-13 ENCOUNTER — Encounter: Payer: Self-pay | Admitting: Internal Medicine

## 2015-08-13 DIAGNOSIS — G7000 Myasthenia gravis without (acute) exacerbation: Secondary | ICD-10-CM | POA: Diagnosis not present

## 2015-08-13 DIAGNOSIS — N183 Chronic kidney disease, stage 3 unspecified: Secondary | ICD-10-CM

## 2015-08-13 DIAGNOSIS — I482 Chronic atrial fibrillation, unspecified: Secondary | ICD-10-CM

## 2015-08-13 DIAGNOSIS — I96 Gangrene, not elsewhere classified: Secondary | ICD-10-CM | POA: Diagnosis not present

## 2015-08-13 DIAGNOSIS — A419 Sepsis, unspecified organism: Secondary | ICD-10-CM | POA: Diagnosis not present

## 2015-08-13 DIAGNOSIS — T83510D Infection and inflammatory reaction due to cystostomy catheter, subsequent encounter: Secondary | ICD-10-CM | POA: Diagnosis not present

## 2015-08-13 DIAGNOSIS — R6521 Severe sepsis with septic shock: Secondary | ICD-10-CM

## 2015-08-13 DIAGNOSIS — A414 Sepsis due to anaerobes: Secondary | ICD-10-CM | POA: Diagnosis not present

## 2015-08-13 DIAGNOSIS — N390 Urinary tract infection, site not specified: Secondary | ICD-10-CM

## 2015-08-13 DIAGNOSIS — F4320 Adjustment disorder, unspecified: Secondary | ICD-10-CM | POA: Diagnosis not present

## 2015-08-13 NOTE — Progress Notes (Signed)
MRN: [id number] Name: Seth [name]  Sex: male Age: 81 y.o. DOB: 1925-04-13  [facility] #: [facility] Facility/Room:111 Level Of Care: SNF Provider: Merrilee Seashore D Emergency Contacts: Extended Emergency Contact Information Primary Emergency Contact: Higley,David & Neoma [place]aming [place] Home Phone: [phone] Mobile Phone: [phone] Relation: Son Secondary Emergency Contact: Richardean Chimera [place] Home Phone: [phone] Relation: None  Code Status:   Allergies: Ceftin  Chief Complaint  Patient presents with  . New Admit To SNF    HPI: Patient is 81 y.o. male whomyasthenia gravis, CK D stage III, chronic suprapubic indwelling catheter, A. fib on eliquis, aortic stenosis, essential hypertension, recurrent UTIs who was sent from the nursing home for hematuria and passage of large clots from his suprapubic catheter. The catheter was changed during earlier during the day and in the afternoon he had fevers with chills. In the ED had a temperature of 102.6 Fahrenheit, hypotensive and tachycardic meeting criteria for septic shock with initial workup suggestive of UTI. Pt was admitted from 10/14-19 for tx of septic shock and non surgical tx of Fourniers gangrene. Pt is admitted to SNF for generalized weakness. While at SNF pt will be followed for a fib, tx with eliquis and metoprolol, MG, tx with mestanon and Vit D def tx with vit D supplement daily.  Past Medical History  Diagnosis Date  . Hypertension   . Hyperlipidemia   . Aortic stenosis   . High cholesterol   . A-fib (HCC)   . History of TIAs   . Multiple falls   . Urinary incontinence   . Dementia     short term memory loss    Past Surgical History  Procedure Laterality Date  . Hernia repair      1970s  . Back surgery  2006  . Cervical laminectomy  2002 ?  [name] Insertion of suprapubic catheter N/A 06/26/2015    Procedure: INSERTION OF SUPRAPUBIC CATHETER;  Surgeon: Malen Gauze, MD;   Location: [facility];  Service: Urology;  Laterality: N/A;  . Cystoscopy N/A 06/26/2015    Procedure: CYSTOSCOPY;  Surgeon: Malen Gauze, MD;  Location: [facility];  Service: Urology;  Laterality: N/A;      Medication List       This list is accurate as of: 08/13/15 11:59 PM.  Always use your most recent med list.               acetaminophen 500 MG tablet  Commonly known as:  TYLENOL  Take 500 mg by mouth every 6 (six) hours as needed for mild pain.     cetaphil cream  Apply 1 application topically as needed (for feet).     ciprofloxacin 250 MG tablet  Commonly known as:  CIPRO  Take 1 tablet (250 mg total) by mouth 2 (two) times daily.     docusate sodium 100 MG capsule  Commonly known as:  COLACE  Take 1 capsule (100 mg total) by mouth 2 (two) times daily.     ELIQUIS 2.5 MG Tabs tablet  Generic drug:  apixaban  Take 2.5 mg by mouth 2 (two) times daily.     feeding supplement (ENSURE ENLIVE) Liqd  Take 237 mLs by mouth 2 (two) times daily between meals.     metoprolol succinate 50 MG 24 hr tablet  Commonly known as:  TOPROL-XL  Take 50 mg by mouth every morning. Take with or immediately following a meal.     mirtazapine 7.5  MG tablet  Commonly known as:  REMERON  Take 7.5 mg by mouth at bedtime.     multivitamin with minerals Tabs tablet  Take 1 tablet by mouth daily.     MYRBETRIQ 25 MG Tb24 tablet  Generic drug:  mirabegron ER  Take 25 mg by mouth daily.     polyethylene glycol packet  Commonly known as:  MIRALAX / GLYCOLAX  Take 17 g by mouth daily as needed for moderate constipation.     predniSONE 10 MG tablet  Commonly known as:  DELTASONE  Take 1 tablet (10 mg total) by mouth daily with breakfast.     pyridostigmine 60 MG tablet  Commonly known as:  MESTINON  Take 1 tablet (60 mg total) by mouth 3 (three) times daily.     senna 8.6 MG Tabs tablet  Commonly known as:  SENOKOT  Take 1 tablet by mouth 2 (two) times daily.     simvastatin 10 MG  tablet  Commonly known as:  ZOCOR  Take 10 mg by mouth daily.     traMADol 50 MG tablet  Commonly known as:  ULTRAM  Take 1 tablet (50 mg total) by mouth every 6 (six) hours as needed for moderate pain.     Vitamin D3 2000 UNITS Tabs  Take 2,000 Units by mouth daily.        No orders of the defined types were placed in this encounter.    Immunization History  Administered Date(s) Administered  . Influenza,inj,Quad PF,36+ Mos 07/05/2015  . Pneumococcal Polysaccharide-23 07/05/2015  . Tdap 03/31/2015    Social History  Substance Use Topics  . Smoking status: Former Games developer  . Smokeless tobacco: Never Used  . Alcohol Use: No    Family history is + prostate CA    Review of Systems  DATA OBTAINED: from patient, nurse- no c/o or concerns GENERAL:  no fevers, fatigue, appetite changes SKIN: No itching, rash or wounds EYES: No eye pain, redness, discharge EARS: No earache, tinnitus, change in hearing NOSE: No congestion, drainage or bleeding  MOUTH/THROAT: No mouth or tooth pain, No sore throat RESPIRATORY: No cough, wheezing, SOB CARDIAC: No chest pain, palpitations, lower extremity edema  GI: No abdominal pain, No N/V/D or constipation, No heartburn or reflux  GU: No dysuria, frequency or urgency, or incontinence  MUSCULOSKELETAL: No unrelieved bone/joint pain NEUROLOGIC: No headache, dizziness or focal weakness PSYCHIATRIC: No c/o anxiety or sadness   Filed Vitals:   08/13/15 1614  BP: 129/77  Pulse: 65  Temp: 96.2 F (35.7 C)  Resp: 20    SpO2 Readings from Last 1 Encounters:  08/07/15 98%        Physical Exam  GENERAL APPEARANCE: Alert, conversant,  No acute distress.  SKIN: No diaphoresis rash HEAD: Normocephalic, atraumatic  EYES: Conjunctiva/lids clear. Pupils round, reactive. EOMs intact.  EARS: External exam WNL, canals clear. Hearing grossly normal.  NOSE: No deformity or discharge.  MOUTH/THROAT: Lips w/o lesions  RESPIRATORY: Breathing is  even, unlabored. Lung sounds are few rhochi with good AF   CARDIOVASCULAR: Heart RRR 3/6 murmur,  No rubs or gallops. No peripheral edema.   GASTROINTESTINAL: Abdomen is soft, non-tender, not distended w/ normal bowel sounds. GENITOURINARY: Bladder non tender, not distended  MUSCULOSKELETAL: No abnormal joints or musculature NEUROLOGIC:  Cranial nerves 2-12 grossly intact. Moves all extremities  PSYCHIATRIC: Mood and affect appropriate to situation, no behavioral issues  Patient Active Problem List   Diagnosis Date Noted  . 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 Bacteremia due to Klebsiella pneumoniae 08/07/2015  . Sepsis due to Klebsiella pneumoniae (HCC) 08/07/2015  . DNR (do not resuscitate) 08/06/2015  . Septic shock (HCC) 08/03/2015  . Hypotension 08/03/2015  . Leukocytosis 08/03/2015  . Catheter-associated urinary tract infection (HCC) 08/03/2015  . Palliative care encounter   . Acute renal failure superimposed on stage 3 chronic kidney disease (HCC) 07/13/2015  . Myasthenia gravis (HCC) 07/13/2015  . Acute encephalopathy 07/04/2015  . Fecal impaction (HCC) 07/04/2015  . Dementia 07/04/2015  . Pressure ulcer 07/04/2015  . AP (abdominal pain)   . Hypoxia   . Altered mental status   . Consciousness loss of 05/29/2015  . Vertigo, central origin 05/29/2015  . Dizziness and giddiness 05/29/2015  . Weakness 05/29/2015  . Cerebral brain hemorrhage (HCC) 05/29/2015  . TIA (transient ischemic attack) 05/29/2015  . Proximal limb muscle weakness 05/29/2015  . Ataxia 05/29/2015  . Syncope 03/31/2015  . CKD (chronic kidney disease), stage III 03/31/2015  . HTN (hypertension) 03/31/2015  . Atrial fibrillation (HCC) 03/31/2015  . Dyslipidemia 03/31/2015    CBC    Component Value Date/Time   WBC 9.6 08/06/2015 0354   RBC 3.32* 08/06/2015 0354   HGB 9.3* 08/06/2015 0354   HCT 28.1* 08/06/2015 0354   PLT 215 08/06/2015 0354   MCV 84.6 08/06/2015 0354   LYMPHSABS 0.3* 08/02/2015  2102   MONOABS 0.2 08/02/2015 2102   EOSABS 0.1 08/02/2015 2102   BASOSABS 0.0 08/02/2015 2102    CMP     Component Value Date/Time   NA 138 08/06/2015 0354   NA 140 05/29/2015 1319   K 3.8 08/06/2015 0354   CL 104 08/06/2015 0354   CO2 26 08/06/2015 0354   GLUCOSE 121* 08/06/2015 0354   GLUCOSE 152* 05/29/2015 1319   BUN 14 08/06/2015 0354   BUN 22 05/29/2015 1319   CREATININE 0.93 08/06/2015 0354   CALCIUM 8.4* 08/06/2015 0354   PROT 6.3* 08/02/2015 2102   PROT 7.7 05/29/2015 1319   ALBUMIN 2.9* 08/02/2015 2102   ALBUMIN 4.0 05/29/2015 1319   AST 44* 08/02/2015 2102   ALT 23 08/02/2015 2102   ALKPHOS 87 08/02/2015 2102   BILITOT 0.5 08/02/2015 2102   BILITOT 1.2 05/29/2015 1319   GFRNONAA >60 08/06/2015 0354   GFRAA >60 08/06/2015 0354    No results found for: HGBA1C   Ct Abdomen Pelvis Wo Contrast  08/03/2015  CLINICAL DATA:  Fever, urinary tract infection, hematuria. Blood clots noted from suprapubic catheter during catheter change. Vomiting. EXAM: CT ABDOMEN AND PELVIS WITHOUT CONTRAST TECHNIQUE: Multidetector CT imaging of the abdomen and pelvis was performed following the standard protocol without IV contrast. COMPARISON:  07/03/2015 FINDINGS: Atelectasis in the lung bases. Calcified lymph nodes in the subcarinal region. Coronary artery calcifications. Cholelithiasis with multiple stones in the dependent portion of the gallbladder. No gallbladder wall thickening or inflammatory reaction. Unenhanced appearance of the liver, spleen, pancreas, adrenal glands, inferior vena cava, and retroperitoneal lymph nodes is unremarkable. Calcification of the abdominal aorta without aneurysm. Calcification possibly with aneurysm of the celiac axis origin. Kidneys demonstrate mild parenchymal scarring. No hydronephrosis or hydroureter. No renal or ureteral stones are demonstrated. Stomach, small bowel, and colon are not abnormally distended. No free air or free fluid in the abdomen.  Abdominal wall musculature appears intact. Pelvis: Suprapubic catheter. Balloon is in the bladder neck. Diffuse thickening of the wall of the bladder suggesting cystitis. Increased density of the urine consistent with hemorrhage. Small stone  in the dependent portion of the bladder. Gas in the bladder is likely post instrumentation. Infiltration in the subcutaneous fat over the dome of the bladder is likely postoperative scarring or infiltration. Marked enlargement of the prostate gland with transverse measurement of 6.7 cm. Gas and infiltrative stranding demonstrated in the base of the penis and in the perineal tissues extending into the left inguinal region and left hemiscrotum. This likely indicates infection with gas-forming organism. Fistula not excluded. Degenerative changes in the lumbar spine with slight anterior subluxation of L4 on L5. Irregular lucency and sclerosis throughout the sacrum and in the right iliac bone. This may be due to osteoporosis or Paget's disease. IMPRESSION: Suprapubic catheter in the bladder. Bladder wall thickening suggesting cystitis. Small stone in the dependent portion of the bladder. Prominent prostate gland enlargement. Gas and inflammatory infiltration in the soft tissues at the base of the penis, the left perianal/perineal region, left groin, and left scrotum. Changes suggest infection with gas-forming organism. Cholelithiasis. Atelectasis in the lung bases. Changes of Paget's disease versus osteoporosis in the sacrum and pelvis. Aneurysm of the celiac axis. These results were called by telephone at the time of interpretation on 08/03/2015 at 12:50 am to Dr. Patria Maneampos, who verbally acknowledged these results. Electronically Signed   By: Burman NievesWilliam  Stevens M.D.   On: 08/03/2015 00:54   Dg Chest Portable 1 View  08/02/2015  CLINICAL DATA:  Fever, cough EXAM: PORTABLE CHEST 1 VIEW COMPARISON:  07/03/2015 FINDINGS: Cardiomediastinal silhouette is stable. Mild elevation of the left  hemidiaphragm again noted. No acute infiltrate or pleural effusion. No pulmonary edema. Mild basilar atelectasis. IMPRESSION: No active disease. Mild elevation of left hemidiaphragm with basilar atelectasis. Electronically Signed   By: Natasha MeadLiviu  Pop M.D.   On: 08/02/2015 21:45    Not all labs, radiology exams or other studies done during hospitalization come through on my EPIC note; however they are reviewed by me.    Assessment and Plan  Septic shock [facility](HCC) Patient presented with fever of 102.60 Fahrenheit, WBC of 20 6.5K with hypotension and tachycardia in the setting of UTI and perineal infection. Lactic acid was elevated to 4.4 with procalcitonin of 95.8. Sepsis pathway initiated in the ED with aggressive IV hydration and empiric antibiotic coverage. Patient Blood cultures positive for Klebsiella pneumonia and antibiotic narrowed to IV Rocephin. -Sepsis has resolved and Patient remains afebrile for at least 48 hours and leukocytosis has now normalized. SNF - oral ciprofloxacin based on sensitivity to complete a total 2 weeks course of antibiotic.   Sepsis due to Klebsiella pneumoniae [facility](HCC) Patient presented with fever of 102.60 Fahrenheit, WBC of 20 6.5K with hypotension and tachycardia in the setting of UTI and perineal infection. Lactic acid was elevated to 4.4 with procalcitonin of 95.8. Sepsis pathway initiated in the ED with aggressive IV hydration and empiric antibiotic coverage. Patient Blood cultures positive for Klebsiella pneumonia and antibiotic narrowed to IV Rocephin. -Sepsis has resolved and Patient remains afebrile for at least 48 hours and leukocytosis has now normalized. SNF -  oral ciprofloxacin based on sensitivity to complete a total 2 weeks course of antibiotic.   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 Seen by urology who recommended against any surgical intervention  given multiple comorbidities and advanced age. -This was discussed with family who also agreed against any aggressive intervention. Patient responding well to IV antibiotics with resolution of his fever and leukocytosis SNF -  discharged on oral ciprofloxacin. He will follow-up with his urologist as outpatient. (Family informed that he has an appointment in next few weeks)  Catheter-associated urinary tract infection (HCC) Complicated UTI associated with suprapubic indwelling catheter. Urine culture not sent this admission. Covering with empiric antibiotic. Family reports multiple UTIs in the past few months. Continue suprapubic catheter with catheter change as scheduled. Will follow-up with his urologist as outpatient.   Myasthenia gravis (HCC) SNF - cont mestinon TID  Atrial fibrillation (HCC) SNF - chronic and stable; ratecont with metoprolol and eilquis as prophylaxis  CKD (chronic kidney disease), stage III SNF - Cr at baseline of 1.0   Time spent 45 min;> 50% of time with patient was spent reviewing records, labs, tests and studies, counseling and developing plan of care  Margit Hanks, MD

## 2015-08-14 DIAGNOSIS — I96 Gangrene, not elsewhere classified: Secondary | ICD-10-CM | POA: Insufficient documentation

## 2015-08-14 NOTE — Assessment & Plan Note (Signed)
SNF - chronic and stable; ratecont with metoprolol and eilquis as prophylaxis

## 2015-08-14 NOTE — Assessment & Plan Note (Signed)
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 Seen by urology who recommended against any surgical intervention given multiple comorbidities and advanced age. -This was discussed with family who also agreed against any aggressive intervention. Patient responding well to IV antibiotics with resolution of his fever and leukocytosis SNF -  discharged on oral ciprofloxacin. He will follow-up with his urologist as outpatient. (Family informed that he has an appointment in next few weeks)

## 2015-08-14 NOTE — Assessment & Plan Note (Signed)
SNF - Cr at baseline of 1.0

## 2015-08-14 NOTE — Assessment & Plan Note (Addendum)
Patient presented with fever of 102.60 Fahrenheit, WBC of 20 6.5K with hypotension and tachycardia in the setting of UTI and perineal infection. Lactic acid was elevated to 4.4 with procalcitonin of 95.8. Sepsis pathway initiated in the ED with aggressive IV hydration and empiric antibiotic coverage. Patient Blood cultures positive for Klebsiella pneumonia and antibiotic narrowed to IV Rocephin. -Sepsis has resolved and Patient remains afebrile for at least 48 hours and leukocytosis has now normalized. SNF - oral ciprofloxacin based on sensitivity to complete a total 2 weeks course of antibiotic.

## 2015-08-14 NOTE — Assessment & Plan Note (Signed)
SNF - cont mestinon TID

## 2015-08-14 NOTE — Assessment & Plan Note (Signed)
Complicated UTI associated with suprapubic indwelling catheter. Urine culture not sent this admission. Covering with empiric antibiotic. Family reports multiple UTIs in the past few months. Continue suprapubic catheter with catheter change as scheduled. Will follow-up with his urologist as outpatient.

## 2015-08-14 NOTE — Assessment & Plan Note (Signed)
Patient presented with fever of 102.60 Fahrenheit, WBC of 20 6.5K with hypotension and tachycardia in the setting of UTI and perineal infection. Lactic acid was elevated to 4.4 with procalcitonin of 95.8. Sepsis pathway initiated in the ED with aggressive IV hydration and empiric antibiotic coverage. Patient Blood cultures positive for Klebsiella pneumonia and antibiotic narrowed to IV Rocephin. -Sepsis has resolved and Patient remains afebrile for at least 48 hours and leukocytosis has now normalized. SNF - oral ciprofloxacin based on sensitivity to complete a total 2 weeks course of antibiotic.  

## 2015-08-15 DIAGNOSIS — R21 Rash and other nonspecific skin eruption: Secondary | ICD-10-CM | POA: Diagnosis not present

## 2015-08-17 ENCOUNTER — Encounter: Payer: Self-pay | Admitting: Internal Medicine

## 2015-08-20 DIAGNOSIS — I129 Hypertensive chronic kidney disease with stage 1 through stage 4 chronic kidney disease, or unspecified chronic kidney disease: Secondary | ICD-10-CM | POA: Diagnosis not present

## 2015-08-20 DIAGNOSIS — H02402 Unspecified ptosis of left eyelid: Secondary | ICD-10-CM | POA: Diagnosis not present

## 2015-08-20 DIAGNOSIS — B961 Klebsiella pneumoniae [K. pneumoniae] as the cause of diseases classified elsewhere: Secondary | ICD-10-CM | POA: Diagnosis not present

## 2015-08-20 DIAGNOSIS — R488 Other symbolic dysfunctions: Secondary | ICD-10-CM | POA: Diagnosis not present

## 2015-08-20 DIAGNOSIS — M1990 Unspecified osteoarthritis, unspecified site: Secondary | ICD-10-CM | POA: Diagnosis not present

## 2015-08-20 DIAGNOSIS — G609 Hereditary and idiopathic neuropathy, unspecified: Secondary | ICD-10-CM | POA: Diagnosis not present

## 2015-08-20 DIAGNOSIS — M6281 Muscle weakness (generalized): Secondary | ICD-10-CM | POA: Diagnosis not present

## 2015-08-20 DIAGNOSIS — R262 Difficulty in walking, not elsewhere classified: Secondary | ICD-10-CM | POA: Diagnosis not present

## 2015-08-20 DIAGNOSIS — M25531 Pain in right wrist: Secondary | ICD-10-CM | POA: Diagnosis not present

## 2015-08-20 DIAGNOSIS — R3912 Poor urinary stream: Secondary | ICD-10-CM | POA: Diagnosis not present

## 2015-08-20 DIAGNOSIS — G7000 Myasthenia gravis without (acute) exacerbation: Secondary | ICD-10-CM | POA: Diagnosis not present

## 2015-08-20 DIAGNOSIS — I350 Nonrheumatic aortic (valve) stenosis: Secondary | ICD-10-CM | POA: Diagnosis not present

## 2015-08-20 DIAGNOSIS — N401 Enlarged prostate with lower urinary tract symptoms: Secondary | ICD-10-CM | POA: Diagnosis not present

## 2015-08-20 DIAGNOSIS — I4891 Unspecified atrial fibrillation: Secondary | ICD-10-CM | POA: Diagnosis not present

## 2015-08-20 DIAGNOSIS — R600 Localized edema: Secondary | ICD-10-CM | POA: Diagnosis not present

## 2015-08-20 DIAGNOSIS — N183 Chronic kidney disease, stage 3 (moderate): Secondary | ICD-10-CM | POA: Diagnosis not present

## 2015-08-20 DIAGNOSIS — Z9181 History of falling: Secondary | ICD-10-CM | POA: Diagnosis not present

## 2015-08-21 ENCOUNTER — Encounter: Payer: Self-pay | Admitting: Internal Medicine

## 2015-08-21 ENCOUNTER — Non-Acute Institutional Stay (SKILLED_NURSING_FACILITY): Payer: Medicare Other | Admitting: Internal Medicine

## 2015-08-21 DIAGNOSIS — M1990 Unspecified osteoarthritis, unspecified site: Secondary | ICD-10-CM | POA: Diagnosis not present

## 2015-08-21 NOTE — Progress Notes (Signed)
MRN: [id number] Name: Seth [name]  Sex: male Age: 81 y.o. DOB: [birth year]  [facility] #: [facility] Facility/Room: 111 Level Of Care: SNF Provider: Merrilee Seashore D Emergency Contacts: Extended Emergency Contact Information Primary Emergency Contact: Saran,David & Neoma Laming [place] Home Phone: [phone] Mobile Phone: [phone] Relation: Son Secondary Emergency Contact: Richardean Chimera [place] Home Phone: [phone] Relation: None  Code Status:   Allergies: Ceftin  Chief Complaint  Patient presents with  . Acute Visit    HPI: Patient is 81 y.o. male with HTN, HLD, ADS, AF, dementia who is being seen acutely for swelling to R wrist onset this am. Initial contact was with nurse over the phone, later in person. Pt denies trauma and nursing report none. No fever or acute illness.Pt admits has had gout before in great toe.  Past Medical History  Diagnosis Date  . Hypertension   . Hyperlipidemia   . Aortic stenosis   . High cholesterol   . A-fib (HCC)   . History of TIAs   . Multiple falls   . Urinary incontinence   . Dementia     short term memory loss    Past Surgical History  Procedure Laterality Date  . Hernia repair      1970s  . Back surgery  2006  . Cervical laminectomy  2002 ?  [name] Insertion of suprapubic catheter N/A 06/26/2015    Procedure: INSERTION OF SUPRAPUBIC CATHETER;  Surgeon: Malen Gauze, MD;  Location: [facility];  Service: Urology;  Laterality: N/A;  . Cystoscopy N/A 06/26/2015    Procedure: CYSTOSCOPY;  Surgeon: Malen Gauze, MD;  Location: [facility];  Service: Urology;  Laterality: N/A;      Medication List       This list is accurate as of: 08/21/15  9:39 PM.  Always use your most recent med list.               acetaminophen 500 MG tablet  Commonly known as:  TYLENOL  Take 500 mg by mouth every 6 (six) hours as needed for mild pain.     cetaphil cream  Apply 1 application topically as needed (for  feet).     ciprofloxacin 250 MG tablet  Commonly known as:  CIPRO  Take 1 tablet (250 mg total) by mouth 2 (two) times daily.     docusate sodium 100 MG capsule  Commonly known as:  COLACE  Take 1 capsule (100 mg total) by mouth 2 (two) times daily.     ELIQUIS 2.5 MG Tabs tablet  Generic drug:  apixaban  Take 2.5 mg by mouth 2 (two) times daily.     feeding supplement (ENSURE ENLIVE) Liqd  Take 237 mLs by mouth 2 (two) times daily between meals.     metoprolol succinate 50 MG 24 hr tablet  Commonly known as:  TOPROL-XL  Take 50 mg by mouth every morning. Take with or immediately following a meal.     mirtazapine 7.5 MG tablet  Commonly known as:  REMERON  Take 7.5 mg by mouth at bedtime.     multivitamin with minerals Tabs tablet  Take 1 tablet by mouth daily.     MYRBETRIQ 25 MG Tb24 tablet  Generic drug:  mirabegron ER  Take 25 mg by mouth daily.     polyethylene glycol packet  Commonly known as:  MIRALAX / GLYCOLAX  Take 17 g by mouth daily as needed for moderate constipation.  predniSONE 10 MG tablet  Commonly known as:  DELTASONE  Take 1 tablet (10 mg total) by mouth daily with breakfast.     pyridostigmine 60 MG tablet  Commonly known as:  MESTINON  Take 1 tablet (60 mg total) by mouth 3 (three) times daily.     senna 8.6 MG Tabs tablet  Commonly known as:  SENOKOT  Take 1 tablet by mouth 2 (two) times daily.     simvastatin 10 MG tablet  Commonly known as:  ZOCOR  Take 10 mg by mouth daily.     traMADol 50 MG tablet  Commonly known as:  ULTRAM  Take 1 tablet (50 mg total) by mouth every 6 (six) hours as needed for moderate pain.     Vitamin D3 2000 UNITS Tabs  Take 2,000 Units by mouth daily.        No orders of the defined types were placed in this encounter.    Immunization History  Administered Date(s) Administered  . Influenza,inj,Quad PF,36+ Mos 07/05/2015  . Pneumococcal Polysaccharide-23 07/05/2015  . Tdap 03/31/2015    Social  History  Substance Use Topics  . Smoking status: Former Games developer  . Smokeless tobacco: Never Used  . Alcohol Use: No    Review of Systems  DATA OBTAINED: from patient, nurse GENERAL:  no fevers, fatigue, appetite changes SKIN: No itching, rash HEENT: No complaint RESPIRATORY: No cough, wheezing, SOB CARDIAC: No chest pain, palpitations, lower extremity edema  GI: No abdominal pain, No N/V/D or constipation, No heartburn or reflux  GU: No dysuria, frequency or urgency, or incontinence  MUSCULOSKELETAL: as in HPI NEUROLOGIC: No headache, dizziness  PSYCHIATRIC: No overt anxiety or sadness  Filed Vitals:   08/21/15 1834  BP: 124/78  Pulse: 88  Temp: 97.3 F (36.3 C)  Resp: 18    Physical Exam  GENERAL APPEARANCE: Alert, conversant, No acute distress  SKIN: No diaphoresis rash HEENT: Unremarkable RESPIRATORY: Breathing is even, unlabored. Lung sounds are clear   CARDIOVASCULAR: Heart RRR no murmurs, rubs or gallops. No peripheral edema  GASTROINTESTINAL: Abdomen is soft, non-tender, not distended w/ normal bowel sounds.  GENITOURINARY: Bladder non tender, not distended  MUSCULOSKELETAL: swelling mostly dorsal wrist, just at wrist. There is heat,no , very tender to minor manipulation NEUROLOGIC: Cranial nerves 2-12 grossly intact. Moves all extremities PSYCHIATRIC: Mood and affect appropriate to situation, no behavioral issues  Patient Active Problem List   Diagnosis Date Noted  . Inflammatory arthritis (HCC) 08/21/2015  . 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 Bacteremia due to Klebsiella pneumoniae 08/07/2015  . Sepsis due to Klebsiella pneumoniae (HCC) 08/07/2015  . DNR (do not resuscitate) 08/06/2015  . Septic shock (HCC) 08/03/2015  . Hypotension 08/03/2015  . Leukocytosis 08/03/2015  . Catheter-associated urinary tract infection (HCC) 08/03/2015  . Palliative care encounter   . Acute renal failure superimposed on stage 3 chronic kidney disease (HCC) 07/13/2015  .  Myasthenia gravis (HCC) 07/13/2015  . Acute encephalopathy 07/04/2015  . Fecal impaction (HCC) 07/04/2015  . Dementia 07/04/2015  . Pressure ulcer 07/04/2015  . AP (abdominal pain)   . Hypoxia   . Altered mental status   . Consciousness loss of 05/29/2015  . Vertigo, central origin 05/29/2015  . Dizziness and giddiness 05/29/2015  . Weakness 05/29/2015  . Cerebral brain hemorrhage (HCC) 05/29/2015  . TIA (transient ischemic attack) 05/29/2015  . Proximal limb muscle weakness 05/29/2015  . Ataxia 05/29/2015  . Syncope 03/31/2015  . CKD (chronic kidney disease), stage III 03/31/2015  .  HTN (hypertension) 03/31/2015  . Atrial fibrillation (HCC) 03/31/2015  . Dyslipidemia 03/31/2015    CBC    Component Value Date/Time   WBC 9.6 08/06/2015 0354   RBC 3.32* 08/06/2015 0354   HGB 9.3* 08/06/2015 0354   HCT 28.1* 08/06/2015 0354   PLT 215 08/06/2015 0354   MCV 84.6 08/06/2015 0354   LYMPHSABS 0.3* 08/02/2015 2102   MONOABS 0.2 08/02/2015 2102   EOSABS 0.1 08/02/2015 2102   BASOSABS 0.0 08/02/2015 2102    CMP     Component Value Date/Time   NA 138 08/06/2015 0354   NA 140 05/29/2015 1319   K 3.8 08/06/2015 0354   CL 104 08/06/2015 0354   CO2 26 08/06/2015 0354   GLUCOSE 121* 08/06/2015 0354   GLUCOSE 152* 05/29/2015 1319   BUN 14 08/06/2015 0354   BUN 22 05/29/2015 1319   CREATININE 0.93 08/06/2015 0354   CALCIUM 8.4* 08/06/2015 0354   PROT 6.3* 08/02/2015 2102   PROT 7.7 05/29/2015 1319   ALBUMIN 2.9* 08/02/2015 2102   ALBUMIN 4.0 05/29/2015 1319   AST 44* 08/02/2015 2102   ALT 23 08/02/2015 2102   ALKPHOS 87 08/02/2015 2102   BILITOT 0.5 08/02/2015 2102   BILITOT 1.2 05/29/2015 1319   GFRNONAA >60 08/06/2015 0354   GFRAA >60 08/06/2015 0354    Assessment and Plan  Inflammatory arthritis (HCC) R wrist - early inflammatory. Pt already on prednisone chronically. With hx gout will tx for acute gout flare with colchicine 1.2 mg once with 0.6 mg an hour later.  Will monitor response and use prednisone if colchicine not efficacious.   Time spent 25 min.;> 50% of time with patient was spent reviewing records, labs, tests and studies, counseling and developing plan of care  Margit HanksALEXANDER, Leib Elahi D, MD

## 2015-08-21 NOTE — Progress Notes (Deleted)
MRN: [id number] Name: Seth [name]  Sex: male Age: 81 y.o. DOB: 1925/09/17  PSC #:  Facility/Room: Level Of Care: SNF Provider: Merrilee SeashoreALEXANDER, Tere Mcconaughey D Emergency Contacts: Extended Emergency Contact Information Primary Emergency Contact: Jenison,David & Neoma LamingEllen  [place] Home Phone: [phone] Mobile Phone: [phone] Relation: Son Secondary Emergency Contact: Richardean ChimeraSmoak,Mark  [place] Home Phone: [phone] Relation: None  Code Status:   Allergies: Ceftin  Chief Complaint  Patient presents with  . New Admit To SNF    HPI: Patient is 81 y.o. male who  Past Medical History  Diagnosis Date  . Hypertension   . Hyperlipidemia   . Aortic stenosis   . High cholesterol   . A-fib (HCC)   . History of TIAs   . Multiple falls   . Urinary incontinence   . Dementia     short term memory loss    Past Surgical History  Procedure Laterality Date  . Hernia repair      1970s  . Back surgery  2006  . Cervical laminectomy  2002 ?  [name]. Insertion of suprapubic catheter N/A 06/26/2015    Procedure: INSERTION OF SUPRAPUBIC CATHETER;  Surgeon: Malen GauzePatrick L McKenzie, MD;  Location: [facility];  Service: Urology;  Laterality: N/A;  . Cystoscopy N/A 06/26/2015    Procedure: CYSTOSCOPY;  Surgeon: Malen GauzePatrick L McKenzie, MD;  Location: [facility];  Service: Urology;  Laterality: N/A;      Medication List       This list is accurate as of: 08/21/15  6:35 PM.  Always use your most recent med list.               acetaminophen 500 MG tablet  Commonly known as:  TYLENOL  Take 500 mg by mouth every 6 (six) hours as needed for mild pain.     cetaphil cream  Apply 1 application topically as needed (for feet).     ciprofloxacin 250 MG tablet  Commonly known as:  CIPRO  Take 1 tablet (250 mg total) by mouth 2 (two) times daily.     docusate sodium 100 MG capsule  Commonly known as:  COLACE  Take 1 capsule (100 mg total) by mouth 2 (two) times daily.     ELIQUIS 2.5 MG  Tabs tablet  Generic drug:  apixaban  Take 2.5 mg by mouth 2 (two) times daily.     feeding supplement (ENSURE ENLIVE) Liqd  Take 237 mLs by mouth 2 (two) times daily between meals.     metoprolol succinate 50 MG 24 hr tablet  Commonly known as:  TOPROL-XL  Take 50 mg by mouth every morning. Take with or immediately following a meal.     mirtazapine 7.5 MG tablet  Commonly known as:  REMERON  Take 7.5 mg by mouth at bedtime.     multivitamin with minerals Tabs tablet  Take 1 tablet by mouth daily.     MYRBETRIQ 25 MG Tb24 tablet  Generic drug:  mirabegron ER  Take 25 mg by mouth daily.     polyethylene glycol packet  Commonly known as:  MIRALAX / GLYCOLAX  Take 17 g by mouth daily as needed for moderate constipation.     predniSONE 10 MG tablet  Commonly known as:  DELTASONE  Take 1 tablet (10 mg total) by mouth daily with breakfast.     pyridostigmine 60 MG tablet  Commonly known as:  MESTINON  Take 1 tablet (60 mg total) by mouth 3 (three)  times daily.     senna 8.6 MG Tabs tablet  Commonly known as:  SENOKOT  Take 1 tablet by mouth 2 (two) times daily.     simvastatin 10 MG tablet  Commonly known as:  ZOCOR  Take 10 mg by mouth daily.     traMADol 50 MG tablet  Commonly known as:  ULTRAM  Take 1 tablet (50 mg total) by mouth every 6 (six) hours as needed for moderate pain.     Vitamin D3 2000 UNITS Tabs  Take 2,000 Units by mouth daily.        No orders of the defined types were placed in this encounter.    Immunization History  Administered Date(s) Administered  . Influenza,inj,Quad PF,36+ Mos 07/05/2015  . Pneumococcal Polysaccharide-23 07/05/2015  . Tdap 03/31/2015    Social History  Substance Use Topics  . Smoking status: Former Games developer  . Smokeless tobacco: Never Used  . Alcohol Use: No    Family history is    Review of Systems  DATA OBTAINED: from patient, nurse, medical record, family member GENERAL:  no fevers, fatigue, appetite  changes SKIN: No itching, rash or wounds EYES: No eye pain, redness, discharge EARS: No earache, tinnitus, change in hearing NOSE: No congestion, drainage or bleeding  MOUTH/THROAT: No mouth or tooth pain, No sore throat RESPIRATORY: No cough, wheezing, SOB CARDIAC: No chest pain, palpitations, lower extremity edema  GI: No abdominal pain, No N/V/D or constipation, No heartburn or reflux  GU: No dysuria, frequency or urgency, or incontinence  MUSCULOSKELETAL: No unrelieved bone/joint pain NEUROLOGIC: No headache, dizziness or focal weakness PSYCHIATRIC: No c/o anxiety or sadness   Filed Vitals:   08/21/15 1834  BP: 124/78  Pulse: 88  Temp: 97.3 F (36.3 C)  Resp: 18    SpO2 Readings from Last 1 Encounters:  08/07/15 98%        Physical Exam  GENERAL APPEARANCE: Alert, conversant,  No acute distress.  SKIN: No diaphoresis rash HEAD: Normocephalic, atraumatic  EYES: Conjunctiva/lids clear. Pupils round, reactive. EOMs intact.  EARS: External exam WNL, canals clear. Hearing grossly normal.  NOSE: No deformity or discharge.  MOUTH/THROAT: Lips w/o lesions  RESPIRATORY: Breathing is even, unlabored. Lung sounds are clear   CARDIOVASCULAR: Heart RRR no murmurs, rubs or gallops. No peripheral edema.   GASTROINTESTINAL: Abdomen is soft, non-tender, not distended w/ normal bowel sounds. GENITOURINARY: Bladder non tender, not distended  MUSCULOSKELETAL: No abnormal joints or musculature NEUROLOGIC:  Cranial nerves 2-12 grossly intact. Moves all extremities  PSYCHIATRIC: Mood and affect appropriate to situation, no behavioral issues  Patient Active Problem List   Diagnosis Date Noted  . 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 Bacteremia due to Klebsiella pneumoniae 08/07/2015  . Sepsis due to Klebsiella pneumoniae (HCC) 08/07/2015  . DNR (do not resuscitate) 08/06/2015  . Septic shock (HCC) 08/03/2015  . Hypotension 08/03/2015  . Leukocytosis 08/03/2015  .  Catheter-associated urinary tract infection (HCC) 08/03/2015  . Palliative care encounter   . Acute renal failure superimposed on stage 3 chronic kidney disease (HCC) 07/13/2015  . Myasthenia gravis (HCC) 07/13/2015  . Acute encephalopathy 07/04/2015  . Fecal impaction (HCC) 07/04/2015  . Dementia 07/04/2015  . Pressure ulcer 07/04/2015  . AP (abdominal pain)   . Hypoxia   . Altered mental status   . Consciousness loss of 05/29/2015  . Vertigo, central origin 05/29/2015  . Dizziness and giddiness 05/29/2015  . Weakness 05/29/2015  . Cerebral brain hemorrhage (HCC) 05/29/2015  .  TIA (transient ischemic attack) 05/29/2015  . Proximal limb muscle weakness 05/29/2015  . Ataxia 05/29/2015  . Syncope 03/31/2015  . CKD (chronic kidney disease), stage III 03/31/2015  . HTN (hypertension) 03/31/2015  . Atrial fibrillation (HCC) 03/31/2015  . Dyslipidemia 03/31/2015    CBC    Component Value Date/Time   WBC 9.6 08/06/2015 0354   RBC 3.32* 08/06/2015 0354   HGB 9.3* 08/06/2015 0354   HCT 28.1* 08/06/2015 0354   PLT 215 08/06/2015 0354   MCV 84.6 08/06/2015 0354   LYMPHSABS 0.3* 08/02/2015 2102   MONOABS 0.2 08/02/2015 2102   EOSABS 0.1 08/02/2015 2102   BASOSABS 0.0 08/02/2015 2102    CMP     Component Value Date/Time   NA 138 08/06/2015 0354   NA 140 05/29/2015 1319   K 3.8 08/06/2015 0354   CL 104 08/06/2015 0354   CO2 26 08/06/2015 0354   GLUCOSE 121* 08/06/2015 0354   GLUCOSE 152* 05/29/2015 1319   BUN 14 08/06/2015 0354   BUN 22 05/29/2015 1319   CREATININE 0.93 08/06/2015 0354   CALCIUM 8.4* 08/06/2015 0354   PROT 6.3* 08/02/2015 2102   PROT 7.7 05/29/2015 1319   ALBUMIN 2.9* 08/02/2015 2102   ALBUMIN 4.0 05/29/2015 1319   AST 44* 08/02/2015 2102   ALT 23 08/02/2015 2102   ALKPHOS 87 08/02/2015 2102   BILITOT 0.5 08/02/2015 2102   BILITOT 1.2 05/29/2015 1319   GFRNONAA >60 08/06/2015 0354   GFRAA >60 08/06/2015 0354    No results found for: HGBA1C   Ct  Abdomen Pelvis Wo Contrast  08/03/2015  CLINICAL DATA:  Fever, urinary tract infection, hematuria. Blood clots noted from suprapubic catheter during catheter change. Vomiting. EXAM: CT ABDOMEN AND PELVIS WITHOUT CONTRAST TECHNIQUE: Multidetector CT imaging of the abdomen and pelvis was performed following the standard protocol without IV contrast. COMPARISON:  07/03/2015 FINDINGS: Atelectasis in the lung bases. Calcified lymph nodes in the subcarinal region. Coronary artery calcifications. Cholelithiasis with multiple stones in the dependent portion of the gallbladder. No gallbladder wall thickening or inflammatory reaction. Unenhanced appearance of the liver, spleen, pancreas, adrenal glands, inferior vena cava, and retroperitoneal lymph nodes is unremarkable. Calcification of the abdominal aorta without aneurysm. Calcification possibly with aneurysm of the celiac axis origin. Kidneys demonstrate mild parenchymal scarring. No hydronephrosis or hydroureter. No renal or ureteral stones are demonstrated. Stomach, small bowel, and colon are not abnormally distended. No free air or free fluid in the abdomen. Abdominal wall musculature appears intact. Pelvis: Suprapubic catheter. Balloon is in the bladder neck. Diffuse thickening of the wall of the bladder suggesting cystitis. Increased density of the urine consistent with hemorrhage. Small stone in the dependent portion of the bladder. Gas in the bladder is likely post instrumentation. Infiltration in the subcutaneous fat over the dome of the bladder is likely postoperative scarring or infiltration. Marked enlargement of the prostate gland with transverse measurement of 6.7 cm. Gas and infiltrative stranding demonstrated in the base of the penis and in the perineal tissues extending into the left inguinal region and left hemiscrotum. This likely indicates infection with gas-forming organism. Fistula not excluded. Degenerative changes in the lumbar spine with slight  anterior subluxation of L4 on L5. Irregular lucency and sclerosis throughout the sacrum and in the right iliac bone. This may be due to osteoporosis or Paget's disease. IMPRESSION: Suprapubic catheter in the bladder. Bladder wall thickening suggesting cystitis. Small stone in the dependent portion of the bladder. Prominent prostate gland enlargement. Gas and  inflammatory infiltration in the soft tissues at the base of the penis, the left perianal/perineal region, left groin, and left scrotum. Changes suggest infection with gas-forming organism. Cholelithiasis. Atelectasis in the lung bases. Changes of Paget's disease versus osteoporosis in the sacrum and pelvis. Aneurysm of the celiac axis. These results were called by telephone at the time of interpretation on 08/03/2015 at 12:50 am to Dr. Patria Mane, who verbally acknowledged these results. Electronically Signed   By: Burman Nieves M.D.   On: 08/03/2015 00:54   Dg Chest Portable 1 View  08/02/2015  CLINICAL DATA:  Fever, cough EXAM: PORTABLE CHEST 1 VIEW COMPARISON:  07/03/2015 FINDINGS: Cardiomediastinal silhouette is stable. Mild elevation of the left hemidiaphragm again noted. No acute infiltrate or pleural effusion. No pulmonary edema. Mild basilar atelectasis. IMPRESSION: No active disease. Mild elevation of left hemidiaphragm with basilar atelectasis. Electronically Signed   By: Natasha Mead M.D.   On: 08/02/2015 21:45    Not all labs, radiology exams or other studies done during hospitalization come through on my EPIC note; however they are reviewed by me.    Assessment and Plan  No problem-specific assessment & plan notes found for this encounter.   Margit Hanks, MD

## 2015-08-21 NOTE — Assessment & Plan Note (Addendum)
R wrist - early inflammatory. Review of chart shows pt already on prednisone chronically and on no meds that could do this, or flare gout. With hx gout will tx for acute gout flare with colchicine 1.2 mg once with 0.6 mg an hour later. Will monitor response and use prednisone if colchicine not efficacious.

## 2015-08-22 ENCOUNTER — Encounter: Payer: Self-pay | Admitting: Neurology

## 2015-08-22 ENCOUNTER — Ambulatory Visit (INDEPENDENT_AMBULATORY_CARE_PROVIDER_SITE_OTHER): Payer: Medicare Other | Admitting: Neurology

## 2015-08-22 VITALS — BP 120/80 | HR 75 | Resp 12

## 2015-08-22 DIAGNOSIS — G7000 Myasthenia gravis without (acute) exacerbation: Secondary | ICD-10-CM

## 2015-08-22 DIAGNOSIS — H02402 Unspecified ptosis of left eyelid: Secondary | ICD-10-CM

## 2015-08-22 DIAGNOSIS — G609 Hereditary and idiopathic neuropathy, unspecified: Secondary | ICD-10-CM

## 2015-08-22 NOTE — Patient Instructions (Signed)
Continue your medications as you are taking them Follow-up with Dr. Gara KronerAhern You are most welcome to contact me, if you have any additional questions

## 2015-08-22 NOTE — Progress Notes (Signed)
Seth [name]   Date: 08/22/2015  Seth [name] MRN: [id number] DOB: 1925/01/13   Dear Dr. Lucia Gaskins:  Thank you for your kind referral of Seth [name] for consultation of myasthenia gravis. Although his history is well known to you, please allow [place] to reiterate it for the purpose of our medical record. The patient was accompanied to the clinic by son, daughter, and daughter-in-law who also provides collateral information.     History of Present Illness: Seth [name] is a 81 y.o. right-handed Caucasian resident of [facility] with notable history significant for myasthenia gravis, stage III CKD, recurrent UTIs with chronic suprapubic indwelling catheter, atrial fibrillation on Eliquis, aortic stenosis, and essential hypertension referred for second opinion for myasthenia gravis.  He reports having recurrent falls in the setting of passing out which started in September 2015 while living indepenently.  Around early 2016, he began experiencing imbalance and was falling because of this without associated syncope. His last syncopal spell was in August 2016. Over the summer, he began experiencing greater weakness of his arms and legs which prompted him to be evaluated by neurology.  He was referred to Dr. Lucia Gaskins for evaluation who in addition to generalized weakness also noticed left ptosis so ordered extensive testing which included AChR antibodies which returned positive for AChR blocking antibody only.  Repetitive nerve stimulation was performed on the median nerve, recording at the ABP but did not show evidence of decrement.  He was started on mestinon and noticed quite dramatic improvement as he was able to sit up better.  He is now taking mestinon  TID and prednisone  (started in September) daily which has improved his strength.  He denies double vision, jaw fatigue, slurred speech, dysphagia, or shortness of breath.  He  still feels weak somewhat, but attributes this to being sedentary.  He is working with PT at his nursing facility and able to ambulate with a walker and uses a wheelchair for long distances.  He has not had any recent falls, but his balance remains unsteady. Of note, there was evidence of sensorimotor polyneuropathy on his EMG.   He has a number of chronic medical problems including frequent UTIs and has a suprapubic catheter.  He has been on a number of antibiotics and it unclear if there has been exacerbation of weakness related to antibiotic therapy.    Out-side paper records, electronic medical record, and images have been reviewed where available and summarized as:  Labs 06/08/2015:  AChR blocking 35%*, binding negative, modulating negative, VGCC negative, vitamin B12 438, CK 25, TSH 0.872  EMG/NCS results performed by Dr. Lucia Gaskins at [facility] 06/23/2015 of the right upper and left lower extremity with RNS of the right median (ABP): There is electrophysiologic evidence for a length-dependent, axonal, sensorimotor polyneuropathy. No evidence to suggest a demyelinating motor neuropathy. No evidence of myopathy or myositis. No acute/ongoing denervation to indicate current radiculopathy. There is no decrement on repetitive nerve stimulation seen to support a diagnosis of Myasthenia Gravis however could only perform this testing on a distal muscle and this is less sensitive than proximal muscles which patient could not tolerate.    MRI brain 06/13/2015: 1.   Moderate cortical atrophy but more severe ventriculomegaly.   Although the large ventricles could be due to primarily to the atrophy, normal pressure hydrocephalus cannot be ruled out. 2.   Moderate age related chronic microvascular ischemic changes. 3.   There are no  acute findings.  CT head 07/04/2015: Stable atrophy and chronic small vessel ischemic change throughout acute intracranial abnormality.  MRA head and neck 06/13/2015:  This is an abnormal MR  angiogram of the intracranial arteries showing mild focal stenosis within the P2 segment of the right posterior cerebral artery that is unlikely to be clinically significant.  Additionally, there is reduced flow in the superior division of the right MCA compared to the left, though no definite stenosis is identified.  No aneurysms were identified.  This MR angiogram of the neck arteries shows no stenosis of the right common carotid origin likely to be clinically significant. Other arteries appeared normal.   Routine EEG 06/18/2015: This is a normal EEG recording in the waking state. No evidence of ictal or interictal discharges are seen.   Past Medical History  Diagnosis Date  . Hypertension   . Hyperlipidemia   . Aortic stenosis   . High cholesterol   . A-fib (HCC)   . History of TIAs   . Multiple falls   . Urinary incontinence   . Dementia     short term memory loss    Past Surgical History  Procedure Laterality Date  . Hernia repair      1970s  . Back surgery  2006  . Cervical laminectomy  2002 ?  [name] Insertion of suprapubic catheter N/A 06/26/2015    Procedure: INSERTION OF SUPRAPUBIC CATHETER;  Surgeon: Malen Gauze, MD;  Location: [facility];  Service: Urology;  Laterality: N/A;  . Cystoscopy N/A 06/26/2015    Procedure: CYSTOSCOPY;  Surgeon: Malen Gauze, MD;  Location: [facility];  Service: Urology;  Laterality: N/A;     Medications:  Outpatient Encounter Prescriptions as of 08/22/2015  Medication Sig Note  . acetaminophen (TYLENOL) 500 MG tablet Take 500 mg by mouth every 6 (six) hours as needed for mild pain.    [name] apixaban (ELIQUIS) 2.5 MG TABS tablet Take 2.5 mg by mouth 2 (two) times daily.   . Cholecalciferol (VITAMIN D3) 2000 UNITS TABS Take 2,000 Units by mouth daily.    [name] docusate sodium (COLACE) 100 MG capsule Take 1 capsule (100 mg total) by mouth 2 (two) times daily.   . Emollient (CETAPHIL) cream Apply 1 application topically as needed (for feet).   .  feeding supplement, ENSURE ENLIVE, (ENSURE ENLIVE) LIQD Take 237 mLs by mouth 2 (two) times daily between meals.   [name] lisinopril (PRINIVIL,ZESTRIL) 20 MG tablet  08/22/2015: Received from: External Pharmacy  . metoprolol succinate (TOPROL-XL) 50 MG 24 hr tablet Take 50 mg by mouth every morning. Take with or immediately following a meal.   . mirtazapine (REMERON) 7.5 MG tablet Take 7.5 mg by mouth at bedtime.   . Multiple Vitamin (MULTIVITAMIN WITH MINERALS) TABS tablet Take 1 tablet by mouth daily.   [name] MYRBETRIQ 25 MG TB24 tablet Take 25 mg by mouth daily.   . polyethylene glycol (MIRALAX / GLYCOLAX) packet Take 17 g by mouth daily as needed for moderate constipation.   . predniSONE (DELTASONE) 10 MG tablet Take 1 tablet (10 mg total) by mouth daily with breakfast.   . pyridostigmine (MESTINON) 60 MG tablet Take 1 tablet (60 mg total) by mouth 3 (three) times daily. (Patient taking differently: Take 30 mg by mouth 3 (three) times daily. )   . senna (SENOKOT) 8.6 MG TABS tablet Take 1 tablet by mouth 2 (two) times daily.    . simvastatin (ZOCOR) 10 MG tablet Take 10 mg by  mouth daily.    . traMADol (ULTRAM) 50 MG tablet Take 1 tablet (50 mg total) by mouth every 6 (six) hours as needed for moderate pain.   . [DISCONTINUED] ciprofloxacin (CIPRO) 250 MG tablet Take 1 tablet (250 mg total) by mouth 2 (two) times daily.   . [DISCONTINUED] ELIQUIS 5 MG TABS tablet  08/22/2015: Received from: External Pharmacy   No facility-administered encounter medications on file as of 08/22/2015.     Allergies:  Allergies  Allergen Reactions  . Ceftin [Cefuroxime] Diarrhea    Severe diarrhea    Family History: Family History  Problem Relation Age of Onset  . Prostate cancer Brother   . Stroke Mother   . Stroke Father   . Hypertension Daughter   . Hyperlipidemia Daughter     Social History: Social History  Substance Use Topics  . Smoking status: Former Games developer  . Smokeless tobacco: Never Used  .  Alcohol Use: No   Social History   Social History Narrative   Lives at :   [facility]   Address: [address] [place], [place] [address]   Phone: [phone]      Caffeine use: Soda rare   Has 4 children   Retired from [employer]    Review of Systems:  CONSTITUTIONAL: No fevers, chills, night sweats, or weight loss.   EYES: No visual changes or eye pain ENT: No hearing changes.  No history of nose bleeds.   RESPIRATORY: No cough, wheezing and shortness of breath.   CARDIOVASCULAR: Negative for chest pain, and palpitations.   GI: Negative for abdominal discomfort, blood in stools or black stools.  No recent change in bowel habits.   GU:  No history of incontinence.   MUSCLOSKELETAL: +history of joint pain or swelling.  No myalgias.   SKIN: Negative for lesions, rash, and itching.   HEMATOLOGY/ONCOLOGY: Negative for prolonged bleeding, bruising easily, and swollen nodes.  No history of cancer.   ENDOCRINE: Negative for cold or heat intolerance, polydipsia or goiter.   PSYCH:  No depression or anxiety symptoms.   NEURO: As Above.   Vital Signs:  BP 120/80 mmHg  Pulse 75  Resp 12  Ht   Wt   SpO2 95%   General Medical Exam:   General:  Well appearing, comfortable in wheelchair, indwelling catheter with dark urine.   Eyes/ENT: see cranial nerve examination.   Neck: No masses appreciated.  Full range of motion without tenderness.  No carotid bruits. Respiratory:  Clear to auscultation, good air entry bilaterally.   Cardiac:  Regular rate and rhythm, no murmur.   Extremities:  Right wrist appears slightly swollen and inflamed. It is tender to palpation.  Skin:  No rashes or lesions.    Neurological Exam: MENTAL STATUS including orientation to time, place, person, recent and remote memory, attention span and concentration, language, and fund of knowledge is fairly intact. Speech is not dysarthric or nasal.  CRANIAL NERVES: II:  No  visual field defects.  Unremarkable fundi.   III-IV-VI: Pupils equal round and reactive to light.  Normal conjugate, extra-ocular eye movements in all directions of gaze.  No nystagmus.  Mild left ptosis without worsening with sustained upward gaze > 30 seconds.   V:  Normal facial sensation.   VII:  There is flattening of the right nasolabial fold.  Frontalis, orbicularis oculi, orbicularis oris is 5/5.  Very mild weakness with testing the buccinator muscle when asked to puff cheeks  against resistance.    VIII:  Normal hearing and vestibular function.   IX-X:  Normal palatal movement.   XI:  Normal shoulder shrug and head rotation.   XII:  Normal tongue strength and range of motion, no deviation or fasciculation.  Tongue strength is 5/5.  MOTOR:  Generalized loss of muscle bulk, especially in the quadriceps bilaterally.  No fasciculations or abnormal movements.  Tone is normal.    Right Upper Extremity:    Left Upper Extremity:    Deltoid  5/5   Deltoid  5/5   Biceps  5/5   Biceps  5/5   Triceps  5/5   Triceps  5/5   Wrist extensors    *Not tested due to pain   Wrist extensors  5/5   Wrist flexors   *Not tested due to pain   Wrist flexors  5/5   Finger extensors  5/5   Finger extensors  5/5   Finger flexors  5/5   Finger flexors  5/5   Dorsal interossei  5-/5   Dorsal interossei  5/5   Abductor pollicis  5/5   Abductor pollicis  5/5   Tone (Ashworth scale)  0  Tone (Ashworth scale)  0   Right Lower Extremity:    Left Lower Extremity:    Hip flexors  5-/5   Hip flexors  5-/5   Hip extensors  5/5   Hip extensors  5/5   Knee flexors  5/5   Knee flexors  5/5   Knee extensors  5/5   Knee extensors  5/5   Dorsiflexors  5/5   Dorsiflexors  5/5   Plantarflexors  5/5   Plantarflexors  5/5   Toe extensors  5/5   Toe extensors  5/5   Toe flexors  5/5   Toe flexors  5/5   Tone (Ashworth scale)  0  Tone (Ashworth scale)  0   MSRs:  Right                                                                  Left brachioradialis 2+  brachioradialis 2+  biceps 2+  biceps 2+  triceps 2+  triceps 2+  patellar 1+  patellar 1+  ankle jerk 0  ankle jerk 0  Hoffman no  Hoffman no  plantar response mute  plantar response mute   SENSORY:  Absent vibration and pin prick distal to ankles bilaterally.  Sensation intact to all modalities in the upper extremities.   COORDINATION/GAIT: Normal finger-to- nose-finger testing intact on the left (right not tested due to wrist pain).  Finger tapping intact.  Gait not tested as patient arrived in wheelchair and did not have a walker available.    IMPRESSION: Seropositive generalized myasthenia gravis (diagnosed 06/2015, AChR blocking positivity only) Clinically, he has mild left ptosis, right nasolabial flattening, and subtle weakness of hip flexors bilaterally. Otherwise, his motor strength appears markedly improved compared to what was documented at his last [name] with Dr. Lucia Gaskins.   Clearly, the mestinon 60mg  TID and prednisone 10mg  has helped.   At this time, I recommend that he continues his current regimen and once he is ambulating better with a walker, consider slowly tapering his prednisone.  We discussed risks and benefits of  corticosteroids. With his potential to develop UTIs and recent history of sepsis, will need to be cautious with his medical therapy and keep him on the lowest dose of prednisone as able.   Further, I had an extensive discussion with the patient regarding the diagnosis, pathogenesis, prognosis, management plan, meds to avoid, and potential crisis myasthenia gravis. I also discussed the warning signs and symptoms of MG crisis (including significant swallowing and breathing difficulty) that should prompt urgent medical evaluation since an exacerbation of myasthenia gravis is potentially life-threatening.   Family is planning to follow-up with Dr. Lucia GaskinsAhern in 4-6 weeks.  I am happy to assist as needed going forward.    The duration of  this appointment [name] was 60 minutes of face-to-face time with the patient.  Greater than 50% of this time was spent in counseling, explanation of diagnosis, planning of further management, and coordination of care.   Thank you for allowing me to participate in patient's care.  If I can answer any additional questions, I would be pleased to do so.    Sincerely,    Taquan Bralley K. Allena KatzPatel, DO

## 2015-08-23 DIAGNOSIS — R3912 Poor urinary stream: Secondary | ICD-10-CM | POA: Diagnosis not present

## 2015-08-23 DIAGNOSIS — N401 Enlarged prostate with lower urinary tract symptoms: Secondary | ICD-10-CM | POA: Diagnosis not present

## 2015-08-27 ENCOUNTER — Ambulatory Visit: Payer: Medicare Other | Admitting: Cardiovascular Disease

## 2015-09-25 ENCOUNTER — Non-Acute Institutional Stay (SKILLED_NURSING_FACILITY): Payer: Medicare Other | Admitting: Internal Medicine

## 2015-09-25 ENCOUNTER — Encounter: Payer: Self-pay | Admitting: Internal Medicine

## 2015-09-25 DIAGNOSIS — R627 Adult failure to thrive: Secondary | ICD-10-CM | POA: Diagnosis not present

## 2015-09-25 DIAGNOSIS — N319 Neuromuscular dysfunction of bladder, unspecified: Secondary | ICD-10-CM

## 2015-09-25 DIAGNOSIS — M1990 Unspecified osteoarthritis, unspecified site: Secondary | ICD-10-CM | POA: Diagnosis not present

## 2015-09-25 DIAGNOSIS — M6289 Other specified disorders of muscle: Secondary | ICD-10-CM

## 2015-09-25 DIAGNOSIS — M6281 Muscle weakness (generalized): Secondary | ICD-10-CM

## 2015-09-25 HISTORY — DX: Neuromuscular dysfunction of bladder, unspecified: N31.9

## 2015-09-25 NOTE — Assessment & Plan Note (Signed)
Pt is not eating as well and weaker. He has told them he is ready to die and they respect that. He is DNR, no PEG.

## 2015-09-25 NOTE — Assessment & Plan Note (Signed)
R wrist, early inflammatory; chronic prednisone;had an episode prior that resolve with colchicine;start colchicine 0.6 mg, 2 po now and 1 po in 1 hour; will monitor effect

## 2015-09-25 NOTE — Assessment & Plan Note (Signed)
Per neurology pt has myasthenia gravis like sx, without the markers but has responded well to mestinon and prednisone; Plan is to continue mestinon but once pt is walking better would like to taper the prednisone;plan- taper prednisone when stronger

## 2015-09-25 NOTE — Assessment & Plan Note (Signed)
With suprapubic catheter and g=freq UTI's. His MS is slightly altered today so will check U/A . He just finished tx several days ago.

## 2015-09-25 NOTE — Progress Notes (Signed)
MRN: [id number] Name: Seth [name]  Sex: male Age: 81 y.o. DOB: [birth year]  [facility] Facility/Room: Level Of Care: SNF Provider: Merrilee Seashore D Emergency Contacts: Extended Emergency Contact Information Primary Emergency Contact: Sarchet,David & Neoma Laming [place] Home Phone: [phone] Mobile Phone: [phone] Relation: Son Secondary Emergency Contact: Richardean Chimera [place] Home Phone: [phone] Relation: None  Code Status:   Allergies: Ceftin  Chief Complaint  Patient presents with  . Acute Visit    HPI: Patient is 81 y.o. male with HTN, HLD, ADS, AF, dementia who is being seen acutely for swelling to R wrist onset this am. Pt did fall yesterday and c/o wriest pain at dinner bit no bruising or swelling noted. Today onset pain lateral wrist that is very tender and warm, pt had a presentation like this prior which was treated with colchicine and resolved. He is also being seen for ongong concerns with muscle weakness and neurogenic bladder and I had a new conversation with family about FTT.  Past Medical History  Diagnosis Date  . Hypertension   . Hyperlipidemia   . Aortic stenosis   . High cholesterol   . A-fib (HCC)   . History of TIAs   . Multiple falls   . Urinary incontinence   . Dementia     short term memory loss    Past Surgical History  Procedure Laterality Date  . Hernia repair      1970s  . Back surgery  2006  . Cervical laminectomy  2002 ?  [name] Insertion of suprapubic catheter N/A 06/26/2015    Procedure: INSERTION OF SUPRAPUBIC CATHETER;  Surgeon: Malen Gauze, MD;  Location: [facility];  Service: Urology;  Laterality: N/A;  . Cystoscopy N/A 06/26/2015    Procedure: CYSTOSCOPY;  Surgeon: Malen Gauze, MD;  Location: [facility];  Service: Urology;  Laterality: N/A;      Medication List       This list is accurate as of: 09/25/15  1:15 PM.  Always use your most recent med list.               acetaminophen 500 MG tablet  Commonly known as:  TYLENOL  Take 500 mg by mouth every 6 (six) hours as needed for mild pain.     cetaphil cream  Apply 1 application topically as needed (for feet).     docusate sodium 100 MG capsule  Commonly known as:  COLACE  Take 1 capsule (100 mg total) by mouth 2 (two) times daily.     ELIQUIS 2.5 MG Tabs tablet  Generic drug:  apixaban  Take 2.5 mg by mouth 2 (two) times daily.     feeding supplement (ENSURE ENLIVE) Liqd  Take 237 mLs by mouth 2 (two) times daily between meals.     lisinopril 20 MG tablet  Commonly known as:  PRINIVIL,ZESTRIL     metoprolol succinate 50 MG 24 hr tablet  Commonly known as:  TOPROL-XL  Take 50 mg by mouth every morning. Take with or immediately following a meal.     mirtazapine 7.5 MG tablet  Commonly known as:  REMERON  Take 7.5 mg by mouth at bedtime.     multivitamin with minerals Tabs tablet  Take 1 tablet by mouth daily.     MYRBETRIQ 25 MG Tb24 tablet  Generic drug:  mirabegron ER  Take 25 mg by mouth daily.     polyethylene glycol packet  Commonly known  as:  MIRALAX / GLYCOLAX  Take 17 g by mouth daily as needed for moderate constipation.     predniSONE 10 MG tablet  Commonly known as:  DELTASONE  Take 1 tablet (10 mg total) by mouth daily with breakfast.     pyridostigmine 60 MG tablet  Commonly known as:  MESTINON  Take 1 tablet (60 mg total) by mouth 3 (three) times daily.     senna 8.6 MG Tabs tablet  Commonly known as:  SENOKOT  Take 1 tablet by mouth 2 (two) times daily.     simvastatin 10 MG tablet  Commonly known as:  ZOCOR  Take 10 mg by mouth daily.     traMADol 50 MG tablet  Commonly known as:  ULTRAM  Take 1 tablet (50 mg total) by mouth every 6 (six) hours as needed for moderate pain.     Vitamin D3 2000 UNITS Tabs  Take 2,000 Units by mouth daily.        No orders of the defined types were placed in this encounter.    Immunization History  Administered  Date(s) Administered  . Influenza,inj,Quad PF,36+ Mos 07/05/2015  . Pneumococcal Polysaccharide-23 07/05/2015  . Tdap 03/31/2015    Social History  Substance Use Topics  . Smoking status: Former Games developermoker  . Smokeless tobacco: Never Used  . Alcohol Use: No    Review of Systems  DATA OBTAINED: from patient, nurse, medical record, family member GENERAL:  no fevers, +generalized weakness,+ appetite changes; per daughter in law, pt is declining and has a desire to die, he is ready SKIN: No itching, rash HEENT: No complaint RESPIRATORY: No cough, wheezing, SOB CARDIAC: No chest pain, palpitations, lower extremity edema  GI: No abdominal pain, No N/V/D or constipation, No heartburn or reflux  GU: No dysuria, frequency or urgency, or incontinence  MUSCULOSKELETAL: pain lateral wrist NEUROLOGIC: No headache, dizziness  PSYCHIATRIC: No overt anxiety or sadness  Filed Vitals:   09/25/15 1301  BP: 115/68  Pulse: 80  Temp: 98.2 F (36.8 C)  Resp: 16    Physical Exam  GENERAL APPEARANCE: Alert, conversant, No acute distress  SKIN: No diaphoresis rash HEENT: Unremarkable RESPIRATORY: Breathing is even, unlabored. Lung sounds are clear   CARDIOVASCULAR: Heart RRR no murmurs, rubs or gallops. No peripheral edema  GASTROINTESTINAL: Abdomen is soft, non-tender, not distended w/ normal bowel sounds.  GENITOURINARY: Bladder non tender, not distended  MUSCULOSKELETAL: mild swelling over ulnar wrist with some redess and mod heat NEUROLOGIC: Cranial nerves 2-12 grossly intact. Moves all extremities PSYCHIATRIC: Mood and affect appropriate to situation, no behavioral issues  Patient Active Problem List   Diagnosis Date Noted  . FTT (failure to thrive) in adult 09/25/2015  . Neurogenic bladder 09/25/2015  . Inflammatory arthritis (HCC) 08/21/2015  . 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 Bacteremia due to Klebsiella pneumoniae 08/07/2015  . Sepsis due to Klebsiella pneumoniae (HCC)  08/07/2015  . DNR (do not resuscitate) 08/06/2015  . Septic shock (HCC) 08/03/2015  . Hypotension 08/03/2015  . Leukocytosis 08/03/2015  . Catheter-associated urinary tract infection (HCC) 08/03/2015  . Palliative care encounter   . Acute renal failure superimposed on stage 3 chronic kidney disease (HCC) 07/13/2015  . Myasthenia gravis (HCC) 07/13/2015  . Acute encephalopathy 07/04/2015  . Fecal impaction (HCC) 07/04/2015  . Dementia 07/04/2015  . Pressure ulcer 07/04/2015  . AP (abdominal pain)   . Hypoxia   . Altered mental status   . Consciousness loss of 05/29/2015  . Vertigo,  central origin 05/29/2015  . Dizziness and giddiness 05/29/2015  . Weakness 05/29/2015  . Cerebral brain hemorrhage (HCC) 05/29/2015  . TIA (transient ischemic attack) 05/29/2015  . Proximal limb muscle weakness 05/29/2015  . Ataxia 05/29/2015  . Syncope 03/31/2015  . CKD (chronic kidney disease), stage III 03/31/2015  . HTN (hypertension) 03/31/2015  . Atrial fibrillation (HCC) 03/31/2015  . Dyslipidemia 03/31/2015    CBC    Component Value Date/Time   WBC 9.6 08/06/2015 0354   RBC 3.32* 08/06/2015 0354   HGB 9.3* 08/06/2015 0354   HCT 28.1* 08/06/2015 0354   PLT 215 08/06/2015 0354   MCV 84.6 08/06/2015 0354   LYMPHSABS 0.3* 08/02/2015 2102   MONOABS 0.2 08/02/2015 2102   EOSABS 0.1 08/02/2015 2102   BASOSABS 0.0 08/02/2015 2102    CMP     Component Value Date/Time   NA 138 08/06/2015 0354   NA 140 05/29/2015 1319   K 3.8 08/06/2015 0354   CL 104 08/06/2015 0354   CO2 26 08/06/2015 0354   GLUCOSE 121* 08/06/2015 0354   GLUCOSE 152* 05/29/2015 1319   BUN 14 08/06/2015 0354   BUN 22 05/29/2015 1319   CREATININE 0.93 08/06/2015 0354   CALCIUM 8.4* 08/06/2015 0354   PROT 6.3* 08/02/2015 2102   PROT 7.7 05/29/2015 1319   ALBUMIN 2.9* 08/02/2015 2102   ALBUMIN 4.0 05/29/2015 1319   AST 44* 08/02/2015 2102   ALT 23 08/02/2015 2102   ALKPHOS 87 08/02/2015 2102   BILITOT 0.5  08/02/2015 2102   BILITOT 1.2 05/29/2015 1319   GFRNONAA >60 08/06/2015 0354   GFRAA >60 08/06/2015 0354    Assessment and Plan  Inflammatory arthritis (HCC) R wrist, early inflammatory; chronic prednisone;had an episode prior that resolve with colchicine;start colchicine 0.6 mg, 2 po now and 1 po in 1 hour; will monitor effect  Proximal limb muscle weakness Per neurology pt has myasthenia gravis like sx, without the markers but has responded well to mestinon and prednisone; Plan is to continue mestinon but once pt is walking better would like to taper the prednisone;plan- taper prednisone when stronger  FTT (failure to thrive) in adult Pt is not eating as well and weaker. He has told them he is ready to die and they respect that. He is DNR, no PEG.  Neurogenic bladder With suprapubic catheter and g=freq UTI's. His MS is slightly altered today so will check U/A . He just finished tx several days ago.   Time spent > 35 min;> 50% of time with patient was spent reviewing records, labs, tests and studies, counseling and developing plan of care  Margit Hanks, MD

## 2015-09-27 DIAGNOSIS — Z9289 Personal history of other medical treatment: Secondary | ICD-10-CM | POA: Diagnosis not present

## 2015-09-27 DIAGNOSIS — R269 Unspecified abnormalities of gait and mobility: Secondary | ICD-10-CM | POA: Diagnosis not present

## 2015-09-27 DIAGNOSIS — R3915 Urgency of urination: Secondary | ICD-10-CM | POA: Diagnosis not present

## 2015-09-27 DIAGNOSIS — N401 Enlarged prostate with lower urinary tract symptoms: Secondary | ICD-10-CM | POA: Diagnosis not present

## 2015-10-01 ENCOUNTER — Non-Acute Institutional Stay (SKILLED_NURSING_FACILITY): Payer: Medicare Other | Admitting: Internal Medicine

## 2015-10-01 DIAGNOSIS — B3749 Other urogenital candidiasis: Secondary | ICD-10-CM

## 2015-10-01 HISTORY — DX: Other urogenital candidiasis: B37.49

## 2015-10-01 NOTE — Progress Notes (Signed)
MRN: [id number] Name: Seth [name]  Sex: male Age: 81 y.o. DOB: Mar 25, 1925  [facility] #: [facility] Facility/Room: Level Of Care: SNF Provider: Merrilee Seashore D Emergency Contacts: Extended Emergency Contact Information Primary Emergency Contact: [place],David & Neoma Laming [place] Home Phone: [phone] Mobile Phone: [phone] Relation: Son Secondary Emergency Contact: Richardean Chimera [place] Home Phone: [phone] Relation: None  Code Status:   Allergies: Ceftin  Chief Complaint  Patient presents with  . Acute Visit    HPI: Patient is 81 y.o. male with HTN, HLD, ADS, AF, dementia who is being seen acutely for UTI. When I was seeing pt several days ago his daughter thought he was acting a little differently than usual. She recalls he acts like this when he gets a UTI. Pt has no urinary symptoms or fever. A U/A was done which was positive for UTI.  Past Medical History  Diagnosis Date  . Hypertension   . Hyperlipidemia   . Aortic stenosis   . High cholesterol   . A-fib (HCC)   . History of TIAs   . Multiple falls   . Urinary incontinence   . Dementia     short term memory loss    Past Surgical History  Procedure Laterality Date  . Hernia repair      1970s  . Back surgery  2006  . Cervical laminectomy  2002 ?  [name] Insertion of suprapubic catheter N/A 06/26/2015    Procedure: INSERTION OF SUPRAPUBIC CATHETER;  Surgeon: Malen Gauze, MD;  Location: [facility];  Service: Urology;  Laterality: N/A;  . Cystoscopy N/A 06/26/2015    Procedure: CYSTOSCOPY;  Surgeon: Malen Gauze, MD;  Location: [facility];  Service: Urology;  Laterality: N/A;      Medication List       This list is accurate as of: 10/01/15 11:59 PM.  Always use your most recent med list.               acetaminophen 500 MG tablet  Commonly known as:  TYLENOL  Take 500 mg by mouth every 6 (six) hours as needed for mild pain.     cetaphil cream  Apply 1 application  topically as needed (for feet).     docusate sodium 100 MG capsule  Commonly known as:  COLACE  Take 1 capsule (100 mg total) by mouth 2 (two) times daily.     ELIQUIS 2.5 MG Tabs tablet  Generic drug:  apixaban  Take 2.5 mg by mouth 2 (two) times daily.     feeding supplement (ENSURE ENLIVE) Liqd  Take 237 mLs by mouth 2 (two) times daily between meals.     lisinopril 20 MG tablet  Commonly known as:  PRINIVIL,ZESTRIL     metoprolol succinate 50 MG 24 hr tablet  Commonly known as:  TOPROL-XL  Take 50 mg by mouth every morning. Take with or immediately following a meal.     mirtazapine 7.5 MG tablet  Commonly known as:  REMERON  Take 7.5 mg by mouth at bedtime.     multivitamin with minerals Tabs tablet  Take 1 tablet by mouth daily.     MYRBETRIQ 25 MG Tb24 tablet  Generic drug:  mirabegron ER  Take 25 mg by mouth daily.     polyethylene glycol packet  Commonly known as:  MIRALAX / GLYCOLAX  Take 17 g by mouth daily as needed for moderate constipation.     predniSONE 10 MG tablet  Commonly known as:  DELTASONE  Take 1 tablet (10 mg total) by mouth daily with breakfast.     pyridostigmine 60 MG tablet  Commonly known as:  MESTINON  Take 1 tablet (60 mg total) by mouth 3 (three) times daily.     senna 8.6 MG Tabs tablet  Commonly known as:  SENOKOT  Take 1 tablet by mouth 2 (two) times daily.     simvastatin 10 MG tablet  Commonly known as:  ZOCOR  Take 10 mg by mouth daily.     traMADol 50 MG tablet  Commonly known as:  ULTRAM  Take 1 tablet (50 mg total) by mouth every 6 (six) hours as needed for moderate pain.     Vitamin D3 2000 UNITS Tabs  Take 2,000 Units by mouth daily.        No orders of the defined types were placed in this encounter.    Immunization History  Administered Date(s) Administered  . Influenza,inj,Quad PF,36+ Mos 07/05/2015  . Pneumococcal Polysaccharide-23 07/05/2015  . Tdap 03/31/2015    Social History  Substance Use  Topics  . Smoking status: Former Games developer  . Smokeless tobacco: Never Used  . Alcohol Use: No    Review of Systems  DATA OBTAINED: from patient, nurse,daughter - as in HPI -acting slt diff than usual GENERAL:  no fevers, fatigue, appetite changes SKIN: No itching, rash HEENT: No complaint RESPIRATORY: No cough, wheezing, SOB CARDIAC: No chest pain, palpitations, lower extremity edema  GI: No abdominal pain, No N/V/D or constipation, No heartburn or reflux  GU: No dysuria, frequency or urgency, or incontinence  MUSCULOSKELETAL: No unrelieved bone/joint pain, wrist is fine today NEUROLOGIC: No headache, dizziness  PSYCHIATRIC: No overt anxiety or sadness  Filed Vitals:   10/06/15 1248  BP: 127/72  Pulse: 78  Temp: 97.1 F (36.2 C)  Resp: 24    Physical Exam  GENERAL APPEARANCE: Alert, conversant, No acute distress  SKIN: No diaphoresis rash HEENT: Unremarkable RESPIRATORY: Breathing is even, unlabored. Lung sounds are clear   CARDIOVASCULAR: Heart RRR no murmurs, rubs or gallops. No peripheral edema  GASTROINTESTINAL: Abdomen is soft, non-tender, not distended w/ normal bowel sounds.  GENITOURINARY: Bladder non tender, not distended  MUSCULOSKELETAL: No abnormal joints or musculature;R wrist is not swollen or sore NEUROLOGIC: Cranial nerves 2-12 grossly intact. Moves all extremities PSYCHIATRIC: Mood and affect appropriate to situation, no behavioral issues  Patient Active Problem List   Diagnosis Date Noted  . Candida UTI 10/01/2015  . FTT (failure to thrive) in adult 09/25/2015  . Neurogenic bladder 09/25/2015  . Inflammatory arthritis (HCC) 08/21/2015  . 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 Bacteremia due to Klebsiella pneumoniae 08/07/2015  . Sepsis due to Klebsiella pneumoniae (HCC) 08/07/2015  . DNR (do not resuscitate) 08/06/2015  . Septic shock (HCC) 08/03/2015  . Hypotension 08/03/2015  . Leukocytosis 08/03/2015  . Catheter-associated urinary tract  infection (HCC) 08/03/2015  . Palliative care encounter   . Acute renal failure superimposed on stage 3 chronic kidney disease (HCC) 07/13/2015  . Myasthenia gravis (HCC) 07/13/2015  . Acute encephalopathy 07/04/2015  . Fecal impaction (HCC) 07/04/2015  . Dementia 07/04/2015  . Pressure ulcer 07/04/2015  . AP (abdominal pain)   . Hypoxia   . Altered mental status   . Consciousness loss of 05/29/2015  . Vertigo, central origin 05/29/2015  . Dizziness and giddiness 05/29/2015  . Weakness 05/29/2015  . Cerebral brain hemorrhage (HCC) 05/29/2015  . TIA (transient ischemic attack) 05/29/2015  .  Proximal limb muscle weakness 05/29/2015  . Ataxia 05/29/2015  . Syncope 03/31/2015  . CKD (chronic kidney disease), stage III 03/31/2015  . HTN (hypertension) 03/31/2015  . Atrial fibrillation (HCC) 03/31/2015  . Dyslipidemia 03/31/2015    CBC    Component Value Date/Time   WBC 9.6 08/06/2015 0354   RBC 3.32* 08/06/2015 0354   HGB 9.3* 08/06/2015 0354   HCT 28.1* 08/06/2015 0354   PLT 215 08/06/2015 0354   MCV 84.6 08/06/2015 0354   LYMPHSABS 0.3* 08/02/2015 2102   MONOABS 0.2 08/02/2015 2102   EOSABS 0.1 08/02/2015 2102   BASOSABS 0.0 08/02/2015 2102    CMP     Component Value Date/Time   NA 138 08/06/2015 0354   NA 140 05/29/2015 1319   K 3.8 08/06/2015 0354   CL 104 08/06/2015 0354   CO2 26 08/06/2015 0354   GLUCOSE 121* 08/06/2015 0354   GLUCOSE 152* 05/29/2015 1319   BUN 14 08/06/2015 0354   BUN 22 05/29/2015 1319   CREATININE 0.93 08/06/2015 0354   CALCIUM 8.4* 08/06/2015 0354   PROT 6.3* 08/02/2015 2102   PROT 7.7 05/29/2015 1319   ALBUMIN 2.9* 08/02/2015 2102   ALBUMIN 4.0 05/29/2015 1319   AST 44* 08/02/2015 2102   ALT 23 08/02/2015 2102   ALKPHOS 87 08/02/2015 2102   BILITOT 0.5 08/02/2015 2102   BILITOT 1.2 05/29/2015 1319   GFRNONAA >60 08/06/2015 0354   GFRAA >60 08/06/2015 0354      Assessment and Plan  Candida UTI > 80,000 candida albicans;  start diflucan 200 mg daily for 14 days    Margit HanksALEXANDER, Josaphine Shimamoto D, MD

## 2015-10-01 NOTE — Assessment & Plan Note (Addendum)
>   80,000 candida albicans; start diflucan 200 mg daily for 14 days; monitor response

## 2015-10-06 ENCOUNTER — Encounter: Payer: Self-pay | Admitting: Internal Medicine

## 2015-10-31 ENCOUNTER — Other Ambulatory Visit: Payer: Self-pay | Admitting: Urology

## 2015-11-05 ENCOUNTER — Inpatient Hospital Stay (HOSPITAL_COMMUNITY)
Admission: EM | Admit: 2015-11-05 | Discharge: 2015-11-09 | DRG: 698 | Disposition: A | Discharge status: Skilled Nursing Facility | Payer: Medicare Other | Attending: Internal Medicine | Admitting: Internal Medicine

## 2015-11-05 ENCOUNTER — Emergency Department (HOSPITAL_COMMUNITY): Payer: Medicare Other

## 2015-11-05 ENCOUNTER — Inpatient Hospital Stay (HOSPITAL_COMMUNITY): Payer: Medicare Other

## 2015-11-05 ENCOUNTER — Encounter (HOSPITAL_COMMUNITY): Payer: Self-pay | Admitting: Emergency Medicine

## 2015-11-05 DIAGNOSIS — Z823 Family history of stroke: Secondary | ICD-10-CM | POA: Diagnosis not present

## 2015-11-05 DIAGNOSIS — Z452 Encounter for adjustment and management of vascular access device: Secondary | ICD-10-CM | POA: Diagnosis not present

## 2015-11-05 DIAGNOSIS — F0390 Unspecified dementia without behavioral disturbance: Secondary | ICD-10-CM | POA: Diagnosis present

## 2015-11-05 DIAGNOSIS — Z881 Allergy status to other antibiotic agents status: Secondary | ICD-10-CM | POA: Diagnosis not present

## 2015-11-05 DIAGNOSIS — Z79899 Other long term (current) drug therapy: Secondary | ICD-10-CM | POA: Diagnosis not present

## 2015-11-05 DIAGNOSIS — I959 Hypotension, unspecified: Secondary | ICD-10-CM | POA: Diagnosis present

## 2015-11-05 DIAGNOSIS — Z8042 Family history of malignant neoplasm of prostate: Secondary | ICD-10-CM | POA: Diagnosis not present

## 2015-11-05 DIAGNOSIS — Z792 Long term (current) use of antibiotics: Secondary | ICD-10-CM | POA: Diagnosis not present

## 2015-11-05 DIAGNOSIS — R627 Adult failure to thrive: Secondary | ICD-10-CM | POA: Diagnosis present

## 2015-11-05 DIAGNOSIS — A419 Sepsis, unspecified organism: Secondary | ICD-10-CM | POA: Diagnosis not present

## 2015-11-05 DIAGNOSIS — Z87891 Personal history of nicotine dependence: Secondary | ICD-10-CM | POA: Diagnosis not present

## 2015-11-05 DIAGNOSIS — R6521 Severe sepsis with septic shock: Secondary | ICD-10-CM | POA: Diagnosis present

## 2015-11-05 DIAGNOSIS — N183 Chronic kidney disease, stage 3 (moderate): Secondary | ICD-10-CM | POA: Diagnosis present

## 2015-11-05 DIAGNOSIS — R296 Repeated falls: Secondary | ICD-10-CM

## 2015-11-05 DIAGNOSIS — Z8249 Family history of ischemic heart disease and other diseases of the circulatory system: Secondary | ICD-10-CM | POA: Diagnosis not present

## 2015-11-05 DIAGNOSIS — B961 Klebsiella pneumoniae [K. pneumoniae] as the cause of diseases classified elsewhere: Secondary | ICD-10-CM | POA: Diagnosis present

## 2015-11-05 DIAGNOSIS — I350 Nonrheumatic aortic (valve) stenosis: Secondary | ICD-10-CM | POA: Diagnosis present

## 2015-11-05 DIAGNOSIS — R262 Difficulty in walking, not elsewhere classified: Secondary | ICD-10-CM | POA: Diagnosis not present

## 2015-11-05 DIAGNOSIS — R413 Other amnesia: Secondary | ICD-10-CM | POA: Diagnosis present

## 2015-11-05 DIAGNOSIS — Z5181 Encounter for therapeutic drug level monitoring: Secondary | ICD-10-CM | POA: Diagnosis not present

## 2015-11-05 DIAGNOSIS — Z8673 Personal history of transient ischemic attack (TIA), and cerebral infarction without residual deficits: Secondary | ICD-10-CM | POA: Diagnosis not present

## 2015-11-05 DIAGNOSIS — W06XXXA Fall from bed, initial encounter: Secondary | ICD-10-CM | POA: Diagnosis present

## 2015-11-05 DIAGNOSIS — Z7952 Long term (current) use of systemic steroids: Secondary | ICD-10-CM

## 2015-11-05 DIAGNOSIS — T83518A Infection and inflammatory reaction due to other urinary catheter, initial encounter: Secondary | ICD-10-CM | POA: Diagnosis present

## 2015-11-05 DIAGNOSIS — Z7901 Long term (current) use of anticoagulants: Secondary | ICD-10-CM | POA: Diagnosis not present

## 2015-11-05 DIAGNOSIS — I481 Persistent atrial fibrillation: Secondary | ICD-10-CM | POA: Diagnosis not present

## 2015-11-05 DIAGNOSIS — N390 Urinary tract infection, site not specified: Secondary | ICD-10-CM | POA: Diagnosis present

## 2015-11-05 DIAGNOSIS — R410 Disorientation, unspecified: Secondary | ICD-10-CM | POA: Diagnosis not present

## 2015-11-05 DIAGNOSIS — E7800 Pure hypercholesterolemia, unspecified: Secondary | ICD-10-CM | POA: Diagnosis present

## 2015-11-05 DIAGNOSIS — N179 Acute kidney failure, unspecified: Secondary | ICD-10-CM | POA: Diagnosis not present

## 2015-11-05 DIAGNOSIS — R2681 Unsteadiness on feet: Secondary | ICD-10-CM | POA: Diagnosis not present

## 2015-11-05 DIAGNOSIS — E785 Hyperlipidemia, unspecified: Secondary | ICD-10-CM | POA: Diagnosis present

## 2015-11-05 DIAGNOSIS — G9340 Encephalopathy, unspecified: Secondary | ICD-10-CM | POA: Diagnosis present

## 2015-11-05 DIAGNOSIS — I129 Hypertensive chronic kidney disease with stage 1 through stage 4 chronic kidney disease, or unspecified chronic kidney disease: Secondary | ICD-10-CM | POA: Diagnosis present

## 2015-11-05 DIAGNOSIS — Z466 Encounter for fitting and adjustment of urinary device: Secondary | ICD-10-CM | POA: Diagnosis not present

## 2015-11-05 DIAGNOSIS — G7000 Myasthenia gravis without (acute) exacerbation: Secondary | ICD-10-CM | POA: Diagnosis not present

## 2015-11-05 DIAGNOSIS — R509 Fever, unspecified: Secondary | ICD-10-CM | POA: Diagnosis not present

## 2015-11-05 DIAGNOSIS — N319 Neuromuscular dysfunction of bladder, unspecified: Secondary | ICD-10-CM | POA: Diagnosis present

## 2015-11-05 DIAGNOSIS — Z66 Do not resuscitate: Secondary | ICD-10-CM | POA: Diagnosis present

## 2015-11-05 DIAGNOSIS — I9589 Other hypotension: Secondary | ICD-10-CM

## 2015-11-05 DIAGNOSIS — I480 Paroxysmal atrial fibrillation: Secondary | ICD-10-CM | POA: Diagnosis not present

## 2015-11-05 DIAGNOSIS — M6281 Muscle weakness (generalized): Secondary | ICD-10-CM | POA: Diagnosis not present

## 2015-11-05 DIAGNOSIS — I4891 Unspecified atrial fibrillation: Secondary | ICD-10-CM | POA: Diagnosis present

## 2015-11-05 DIAGNOSIS — R10819 Abdominal tenderness, unspecified site: Secondary | ICD-10-CM | POA: Diagnosis not present

## 2015-11-05 HISTORY — DX: Repeated falls: R29.6

## 2015-11-05 LAB — COMPREHENSIVE METABOLIC PANEL
ALT: 28 U/L (ref 17–63)
AST: 49 U/L — AB (ref 15–41)
Albumin: 2.9 g/dL — ABNORMAL LOW (ref 3.5–5.0)
Alkaline Phosphatase: 75 U/L (ref 38–126)
Anion gap: 21 — ABNORMAL HIGH (ref 5–15)
BUN: 28 mg/dL — AB (ref 6–20)
CHLORIDE: 106 mmol/L (ref 101–111)
CO2: 17 mmol/L — AB (ref 22–32)
CREATININE: 2.38 mg/dL — AB (ref 0.61–1.24)
Calcium: 9.2 mg/dL (ref 8.9–10.3)
GFR, EST AFRICAN AMERICAN: 26 mL/min — AB (ref >60)
GFR, EST NON AFRICAN AMERICAN: 23 mL/min — AB (ref >60)
Glucose, Bld: 130 mg/dL — ABNORMAL HIGH (ref 65–99)
POTASSIUM: 4.5 mmol/L (ref 3.5–5.1)
SODIUM: 144 mmol/L (ref 135–145)
Total Bilirubin: 0.6 mg/dL (ref 0.3–1.2)
Total Protein: 6 g/dL — ABNORMAL LOW (ref 6.5–8.1)

## 2015-11-05 LAB — CBC WITH DIFFERENTIAL/PLATELET
BASOS PCT: 0 %
Basophils Absolute: 0 10*3/uL (ref 0.0–0.1)
EOS PCT: 0 %
Eosinophils Absolute: 0 10*3/uL (ref 0.0–0.7)
HEMATOCRIT: 44.3 % (ref 39.0–52.0)
Hemoglobin: 15 g/dL (ref 13.0–17.0)
Lymphocytes Relative: 2 %
Lymphs Abs: 0.6 10*3/uL — ABNORMAL LOW (ref 0.7–4.0)
MCH: 29.5 pg (ref 26.0–34.0)
MCHC: 33.9 g/dL (ref 30.0–36.0)
MCV: 87 fL (ref 78.0–100.0)
MONOS PCT: 9 %
Monocytes Absolute: 2.6 10*3/uL — ABNORMAL HIGH (ref 0.1–1.0)
NEUTROS PCT: 89 %
Neutro Abs: 25.6 10*3/uL — ABNORMAL HIGH (ref 1.7–7.7)
Platelets: 247 10*3/uL (ref 150–400)
RBC: 5.09 MIL/uL (ref 4.22–5.81)
RDW: 16.2 % — ABNORMAL HIGH (ref 11.5–15.5)
WBC: 28.8 10*3/uL — AB (ref 4.0–10.5)

## 2015-11-05 LAB — LACTIC ACID, PLASMA
Lactic Acid, Venous: 8.2 mmol/L (ref 0.5–2.0)
Lactic Acid, Venous: 6.5 mmol/L (ref 0.5–2.0)
Lactic Acid, Venous: 4.7 mmol/L (ref 0.5–2.0)

## 2015-11-05 LAB — APTT: aPTT: 31 seconds (ref 24–37)

## 2015-11-05 LAB — PROCALCITONIN: Procalcitonin: 80.07 ng/mL

## 2015-11-05 LAB — MRSA PCR SCREENING: MRSA by PCR: NEGATIVE

## 2015-11-05 LAB — PROTIME-INR
INR: 1.43 (ref 0.00–1.49)
PROTHROMBIN TIME: 17.5 s — AB (ref 11.6–15.2)

## 2015-11-05 LAB — I-STAT CG4 LACTIC ACID, ED: LACTIC ACID, VENOUS: 11.99 mmol/L — AB (ref 0.5–2.0)

## 2015-11-05 MED ORDER — ACETAMINOPHEN 650 MG RE SUPP
650.0000 mg | Freq: Four times a day (QID) | RECTAL | Status: DC | PRN
  Administered 2015-11-05: 650 mg via RECTAL
  Filled 2015-11-05: qty 1

## 2015-11-05 MED ORDER — SODIUM CHLORIDE 0.9 % IV BOLUS (SEPSIS)
1000.0000 mL | Freq: Once | INTRAVENOUS | Status: AC
  Administered 2015-11-05: 1000 mL via INTRAVENOUS

## 2015-11-05 MED ORDER — VANCOMYCIN HCL IN DEXTROSE 1-5 GM/200ML-% IV SOLN: 1000.0000 mg | INTRAVENOUS | Status: DC

## 2015-11-05 MED ORDER — VANCOMYCIN HCL IN DEXTROSE 1-5 GM/200ML-% IV SOLN
1000.0000 mg | Freq: Once | INTRAVENOUS | Status: AC
  Administered 2015-11-05: 1000 mg via INTRAVENOUS
  Filled 2015-11-05: qty 200

## 2015-11-05 MED ORDER — SODIUM CHLORIDE 0.9 % IV SOLN
INTRAVENOUS | Status: DC
  Administered 2015-11-05: 14:00:00 via INTRAVENOUS

## 2015-11-05 MED ORDER — SODIUM CHLORIDE 0.9 % IV SOLN
INTRAVENOUS | Status: DC
  Administered 2015-11-05 – 2015-11-09 (×9): via INTRAVENOUS

## 2015-11-05 MED ORDER — ACETAMINOPHEN 325 MG PO TABS: 650.0000 mg | ORAL_TABLET | Freq: Four times a day (QID) | ORAL | Status: DC | PRN

## 2015-11-05 MED ORDER — PIPERACILLIN-TAZOBACTAM 3.375 G IVPB
3.3750 g | Freq: Three times a day (TID) | INTRAVENOUS | Status: DC
  Administered 2015-11-05 – 2015-11-08 (×8): 3.375 g via INTRAVENOUS
  Filled 2015-11-05 (×7): qty 50

## 2015-11-05 MED ORDER — SODIUM CHLORIDE 0.9 % IV BOLUS (SEPSIS)
1000.0000 mL | Freq: Once | INTRAVENOUS | Status: AC
Start: 2015-11-05 — End: 2015-11-05
  Administered 2015-11-05: 1000 mL via INTRAVENOUS

## 2015-11-05 MED ORDER — BISACODYL 10 MG RE SUPP
10.0000 mg | Freq: Every day | RECTAL | Status: DC
Start: 2015-11-05 — End: 2015-11-09
  Administered 2015-11-05 – 2015-11-09 (×5): 10 mg via RECTAL
  Filled 2015-11-05 (×5): qty 1

## 2015-11-05 MED ORDER — PIPERACILLIN-TAZOBACTAM 3.375 G IVPB
3.3750 g | Freq: Once | INTRAVENOUS | Status: AC
  Administered 2015-11-05: 3.375 g via INTRAVENOUS
  Filled 2015-11-05: qty 50

## 2015-11-05 MED ORDER — SODIUM CHLORIDE 0.9 % IJ SOLN
3.0000 mL | Freq: Two times a day (BID) | INTRAMUSCULAR | Status: DC
  Administered 2015-11-05 – 2015-11-09 (×6): 3 mL via INTRAVENOUS

## 2015-11-05 MED ORDER — ONDANSETRON HCL 4 MG/2ML IJ SOLN: 4.0000 mg | Freq: Four times a day (QID) | INTRAMUSCULAR | Status: DC | PRN

## 2015-11-05 MED ORDER — ONDANSETRON HCL 4 MG PO TABS: 4.0000 mg | ORAL_TABLET | Freq: Four times a day (QID) | ORAL | Status: DC | PRN

## 2015-11-05 NOTE — Progress Notes (Signed)
ANTIBIOTIC CONSULT NOTE - INITIAL  Pharmacy Consult for vancomycin and zosyn Indication: rule out sepsis  Allergies  Allergen Reactions  . Ceftin [Cefuroxime] Diarrhea    Severe diarrhea    Patient Measurements: Height:  (180.3 cm) Weight: 170 lb (77.111 kg) IBW/kg (Calculated) : 75.3   Vital Signs: Temp: 97.5 F (36.4 C) (01/17 1525) Temp Source: Oral (01/17 1525) BP: 89/59 mmHg (01/17 1525) Pulse Rate: 88 (01/17 1525) Intake/Output from previous day:   Intake/Output from this shift:    Labs:  Recent Labs  11/05/15 1258  WBC 28.8*  HGB 15.0  PLT 247  CREATININE 2.38*   Estimated Creatinine Clearance: 22.9 mL/min (by C-G formula based on Cr of 2.38). No results for input(s): VANCOTROUGH, VANCOPEAK, VANCORANDOM, GENTTROUGH, GENTPEAK, GENTRANDOM, TOBRATROUGH, TOBRAPEAK, TOBRARND, AMIKACINPEAK, AMIKACINTROU, AMIKACIN in the last 72 hours.   Microbiology: No results found for this or any previous visit (from the past 720 hour(s)).  Medical History: Past Medical History  Diagnosis Date  . Hypertension   . Hyperlipidemia   . Aortic stenosis   . High cholesterol   . A-fib (HCC)   . History of TIAs   . Multiple falls   . Urinary incontinence   . Dementia     short term memory loss   Assessment: Patient's an 82 y.o M presented to the ED on 1/17 from [facility] with c/o AMS.  To start broad abx for suspected sepsis.   - Tmax 100.8, wbc 28.8, scr 2.38 (crcl~23) - LA 11.99  1/17 vanc>> 1/17 zosyn>>  1/17 bcx x2:  1/17 ucx:  Goal of Therapy:  Vancomycin trough level 15-20 mcg/ml  Plan:  - zosyn 3.375 gm IV x1 over 30 minutes, then 3.375 gm IV q8h (infuse over 4 hours) - vancomycin 1gm IV x1, then vancomycin 1gm IV q48h - monitor renal function closely and adjust abx if/when appropriate.  Angelina Venard P 11/05/2015,3:57 PM

## 2015-11-05 NOTE — ED Provider Notes (Addendum)
CSN: [id number]     Arrival date & time 11/05/15  1223 History   First MD Initiated Contact with Patient 11/05/15 1315     Chief Complaint  Patient presents with  . Altered Mental Status     (Consider location/radiation/quality/duration/timing/severity/associated sxs/prior Treatment) HPI Comments: Patient here from home with altered mental status 1 day. Does have a history of dementia and lives in nursing home and staff noted that since 4 AM the patient has been more altered. Family states that he has not had any recent illnesses. IV fluids were given at the facility prior to arrival. EMS was called and patient found to have a CBG of 140 was hypotensive. Transported here for further management  Patient is a 82 y.o. male presenting with altered mental status. The history is provided by a relative. The history is limited by the condition of the patient.  Altered Mental Status   Past Medical History  Diagnosis Date  . Hypertension   . Hyperlipidemia   . Aortic stenosis   . High cholesterol   . A-fib (HCC)   . History of TIAs   . Multiple falls   . Urinary incontinence   . Dementia     short term memory loss   Past Surgical History  Procedure Laterality Date  . Hernia repair      1970s  . Back surgery  2006  . Cervical laminectomy  2002 ?  [name] Insertion of suprapubic catheter N/A 06/26/2015    Procedure: INSERTION OF SUPRAPUBIC CATHETER;  Surgeon: Malen Gauze, MD;  Location: [facility];  Service: Urology;  Laterality: N/A;  . Cystoscopy N/A 06/26/2015    Procedure: CYSTOSCOPY;  Surgeon: Malen Gauze, MD;  Location: [facility];  Service: Urology;  Laterality: N/A;   Family History  Problem Relation Age of Onset  . Prostate cancer Brother   . Stroke Mother   . Stroke Father   . Hypertension Daughter   . Hyperlipidemia Daughter    Social History  Substance Use Topics  . Smoking status: Former Games developer  . Smokeless tobacco: Never Used  . Alcohol Use: No    Review of  Systems  Unable to perform ROS: Mental status change      Allergies  Ceftin  Home Medications   Prior to Admission medications   Medication Sig Start Date End Date Taking? Authorizing Provider  acetaminophen (TYLENOL) 500 MG tablet Take 500 mg by mouth every 6 (six) hours as needed for mild pain.    Yes Historical Provider, MD  apixaban (ELIQUIS) 2.5 MG TABS tablet Take 2.5 mg by mouth 2 (two) times daily.   Yes Historical Provider, MD  Cholecalciferol (VITAMIN D3) 2000 UNITS TABS Take 2,000 Units by mouth daily.    Yes Historical Provider, MD  docusate sodium (COLACE) 100 MG capsule Take 1 capsule (100 mg total) by mouth 2 (two) times daily. 07/07/15  Yes Ripudeep Jenna Luo, MD  feeding supplement, ENSURE ENLIVE, (ENSURE ENLIVE) LIQD Take 237 mLs by mouth 2 (two) times daily between meals. 07/07/15  Yes Ripudeep Jenna Luo, MD  metoprolol succinate (TOPROL-XL) 50 MG 24 hr tablet Take 50 mg by mouth every morning. Take with or immediately following a meal.   Yes Historical Provider, MD  mirtazapine (REMERON) 7.5 MG tablet Take 7.5 mg by mouth at bedtime.   Yes Historical Provider, MD  Multiple Vitamin (MULTIVITAMIN WITH MINERALS) TABS tablet Take 1 tablet by mouth daily.   Yes Historical Provider, MD  MYRBETRIQ 25 MG  TB24 tablet Take 25 mg by mouth daily. 06/29/15  Yes Historical Provider, MD  OVER THE COUNTER MEDICATION Take 1 Dose by mouth at bedtime. Offer "magic cup" as bedtime snack related to weight loss   Yes Historical Provider, MD  polyethylene glycol (MIRALAX / GLYCOLAX) packet Take 17 g by mouth daily as needed for moderate constipation. 07/07/15  Yes Ripudeep Jenna Luo, MD  predniSONE (DELTASONE) 10 MG tablet Take 1 tablet (10 mg total) by mouth daily with breakfast. 07/03/15  Yes Anson Fret, MD  pyridostigmine (MESTINON) 60 MG tablet Take 1 tablet (60 mg total) by mouth 3 (three) times daily. Patient taking differently: Take 30 mg by mouth 3 (three) times daily.  07/03/15  Yes Anson Fret, MD  senna (SENOKOT) 8.6 MG TABS tablet Take 1 tablet by mouth 2 (two) times daily.    Yes Historical Provider, MD  simvastatin (ZOCOR) 10 MG tablet Take 10 mg by mouth daily.    Yes Historical Provider, MD  traMADol (ULTRAM) 50 MG tablet Take 1 tablet (50 mg total) by mouth every 6 (six) hours as needed for moderate pain. 08/07/15  Yes Nishant Dhungel, MD   BP 89/58 mmHg  Pulse 93  Temp(Src) 100.8 F (38.2 C) (Rectal)  Resp 27  Ht  (1.803 m)  Wt 77.111 kg  BMI 23.72 kg/m2  SpO2 94% Physical Exam  Constitutional: He appears well-developed and well-nourished. He appears lethargic.  Non-toxic appearance. No distress.  HENT:  Head: Normocephalic and atraumatic.  Eyes: Conjunctivae, EOM and lids are normal. Pupils are equal, round, and reactive to light.  Neck: Normal range of motion. Neck supple. No tracheal deviation present. No thyroid mass present.  Cardiovascular: Regular rhythm and normal heart sounds.  Tachycardia present.  Exam reveals no gallop.   No murmur heard. Pulmonary/Chest: Effort normal. No stridor. No respiratory distress. He has decreased breath sounds. He has no wheezes. He has rhonchi. He has no rales.  Abdominal: Soft. Normal appearance and bowel sounds are normal. He exhibits no distension. There is no tenderness. There is no rebound and no CVA tenderness.  Musculoskeletal: Normal range of motion. He exhibits no edema or tenderness.  Neurological: He appears lethargic. GCS eye subscore is 3. GCS verbal subscore is 4. GCS motor subscore is 4.  Skin: Skin is warm and dry. No abrasion and no rash noted.  Nursing note and vitals reviewed.   ED Course  Procedures (including critical care time) Labs Review Labs Reviewed  I-STAT CG4 LACTIC ACID, ED - Abnormal; Notable for the following:    Lactic Acid, Venous 11.99 (*)    All other components within normal limits  CULTURE, BLOOD (ROUTINE X 2)  CULTURE, BLOOD (ROUTINE X 2)  URINE CULTURE  COMPREHENSIVE  METABOLIC PANEL  CBC WITH DIFFERENTIAL/PLATELET  URINALYSIS, ROUTINE W REFLEX MICROSCOPIC (NOT AT [facility])    Imaging Review No results found. I have personally reviewed and evaluated these images and lab results as part of my medical decision-making.   EKG Interpretation   Date/Time:  Tuesday November 05 2015 12:45:48 EST Ventricular Rate:  96 PR Interval:  227 QRS Duration: 88 QT Interval:  379 QTC Calculation: 479 R Axis:   50 Text Interpretation:  Sinus rhythm Prolonged PR interval Abnormal T,  consider ischemia, lateral leads Borderline ST elevation, inferior leads  Confirmed by Freida Busman  MD, Grae Cannata ([id number]) on 11/05/2015 1:44:49 PM      MDM   Final diagnoses:  None    Patient  is currently a DO NOT RESUSCITATE and family request that patient have fluids and IV antibiotics only. After speaking with the critical care physician, Dr. Molli Knock, Patient does not meet criteria for sepsis pathway.  2:55 PM Patient started on IV antibiotics and given fluids. We'll admit to medicine service   CRITICAL CARE Performed by: Toy Baker Total critical care time: 45 minutes Critical care time was exclusive of separately billable procedures and treating other patients. Critical care was necessary to treat or prevent imminent or life-threatening deterioration. Critical care was time spent personally by me on the following activities: development of treatment plan with patient and/or surrogate as well as nursing, discussions with consultants, evaluation of patient's response to treatment, examination of patient, obtaining history from patient or surrogate, ordering and performing treatments and interventions, ordering and review of laboratory studies, ordering and review of radiographic studies, pulse oximetry and re-evaluation of patient's condition.   Lorre Nick, MD 11/05/15 1455  Lorre Nick, MD 11/05/15 1536

## 2015-11-05 NOTE — ED Notes (Signed)
Called Seth [name] and she said it was ok to bring patient up within 20 minutes

## 2015-11-05 NOTE — ED Notes (Signed)
Critical i stat result given to RN and EDP Freida Busman

## 2015-11-05 NOTE — Progress Notes (Signed)
CSW attempted to speak with patient. However, nurse at bedside conducting procedure. CSW will attempt to speak with later.  Trish Mage [phone] ED CSW 11/05/2015 5:12 PM

## 2015-11-05 NOTE — Progress Notes (Signed)
CRITICAL VALUE ALERT  Critical value received:  Lactic Acid 6.5  Date of notification:  11/05/15  Time of notification:  2030  Critical value read back:Yes.    Nurse who received alert:  Hartford Poli  MD notified (1st page):  K.Schorr  Time of first page:  2045  MD notified (2nd page):  Time of second page:  Responding MD:   Time MD responded:

## 2015-11-05 NOTE — ED Notes (Addendum)
Code sepsis called.

## 2015-11-05 NOTE — ED Notes (Signed)
ATTEMPTED TO COLLECT LABS AND WAS UNSUCCESSFUL.

## 2015-11-05 NOTE — H&P (Signed)
[facility] History and Physical  Seth [name] WUJ:[id number] DOB: 10-20-1924 DOA: 11/05/2015  Referring physician: Dr. Freida Busman PCP:  Duane Lope, MD   Chief Complaint:  Sent from skilled nursing facility for acute encephalopathy  HPI:  82 year old male with history of hypertension, hyperlipidemia, A. fib on anticoagulation, history of TIAs, multiple falls, neurogenic bladder with suprapubic catheter, mild-to-moderate dementia who was sent from skilled nursing facility after being increasingly confused since this morning. History obtained from ED physician and family at bedside. Patient is somnolent and poorly responsive and unable to provide any history. As per the family and had a fall from the bed yesterday on 2 occasions but did not sustain any injury. He was doing fine throughout the day. Early this morning he was found to be confused. He was also reported to have some fever and was sent to the hospital. Family reported that he was at his usual health 2 days ago when they saw him. At baseline he has mild-to-moderate dementia and requires assistance to ambulate using a wheelchair. Patient reportedly did not make any urine since today. His suprapubic catheter was changed at the facility yesterday (gets changed every month).  Patient was admitted to [facility] in September 2016 with acute encephalopathy with UTI and fecal impaction.  Course in the ED Patient was found to be in septic shock with fever of 100.24F, acute neck, blood pressure of 89/58 mmHg, maintaining sats on 2 L nasal cannula. Blood work showed WBC of 28.8, normal hemoglobin and platelets. Chemistry showed elevated BUN and creatinine of 28 and 2.38. Lactic acid was elevated to 12. Chest x-ray was unremarkable. Patient given 1 L normal saline bolus IV vancomycin and Zosyn. Critical care (Dr. Molli Knock) was consulted who recommended that since patient was DO NOT RESUSCITATE and family did not wish for any aggressive measures  including use of pressors or aggressive resuscitation, he did not require ICU level of care. Hospitalist admission requested to stepdown unit.  Review of Systems:  As outlined in history of present illness. 12 point review of systems otherwise unremarkable. Patient poorly responsive and unable to provide any history.  Past Medical History  Diagnosis Date  . Hypertension   . Hyperlipidemia   . Aortic stenosis   . High cholesterol   . A-fib (HCC)   . History of TIAs   . Multiple falls   . Urinary incontinence   . Dementia     short term memory loss   Past Surgical History  Procedure Laterality Date  . Hernia repair      1970s  . Back surgery  2006  . Cervical laminectomy  2002 ?  [name] Insertion of suprapubic catheter N/A 06/26/2015    Procedure: INSERTION OF SUPRAPUBIC CATHETER;  Surgeon: Malen Gauze, MD;  Location: [facility];  Service: Urology;  Laterality: N/A;  . Cystoscopy N/A 06/26/2015    Procedure: CYSTOSCOPY;  Surgeon: Malen Gauze, MD;  Location: [facility];  Service: Urology;  Laterality: N/A;   Social History:  reports that he has quit smoking. He has never used smokeless tobacco. He reports that he does not drink alcohol or use illicit drugs.  Allergies  Allergen Reactions  . Ceftin [Cefuroxime] Diarrhea    Severe diarrhea    Family History  Problem Relation Age of Onset  . Prostate cancer Brother   . Stroke Mother   . Stroke Father   . Hypertension Daughter   . Hyperlipidemia Daughter     Prior to Admission medications  Medication Sig Start Date End Date Taking? Authorizing Provider  acetaminophen (TYLENOL) 500 MG tablet Take 500 mg by mouth every 6 (six) hours as needed for mild pain.    Yes Historical Provider, MD  apixaban (ELIQUIS) 2.5 MG TABS tablet Take 2.5 mg by mouth 2 (two) times daily.   Yes Historical Provider, MD  Cholecalciferol (VITAMIN D3) 2000 UNITS TABS Take 2,000 Units by mouth daily.    Yes Historical Provider, MD  docusate sodium  (COLACE) 100 MG capsule Take 1 capsule (100 mg total) by mouth 2 (two) times daily. 07/07/15  Yes Ripudeep Jenna Luo, MD  feeding supplement, ENSURE ENLIVE, (ENSURE ENLIVE) LIQD Take 237 mLs by mouth 2 (two) times daily between meals. 07/07/15  Yes Ripudeep Jenna Luo, MD  metoprolol succinate (TOPROL-XL) 50 MG 24 hr tablet Take 50 mg by mouth every morning. Take with or immediately following a meal.   Yes Historical Provider, MD  mirtazapine (REMERON) 7.5 MG tablet Take 7.5 mg by mouth at bedtime.   Yes Historical Provider, MD  Multiple Vitamin (MULTIVITAMIN WITH MINERALS) TABS tablet Take 1 tablet by mouth daily.   Yes Historical Provider, MD  MYRBETRIQ 25 MG TB24 tablet Take 25 mg by mouth daily. 06/29/15  Yes Historical Provider, MD  OVER THE COUNTER MEDICATION Take 1 Dose by mouth at bedtime. Offer "magic cup" as bedtime snack related to weight loss   Yes Historical Provider, MD  polyethylene glycol (MIRALAX / GLYCOLAX) packet Take 17 g by mouth daily as needed for moderate constipation. 07/07/15  Yes Ripudeep Jenna Luo, MD  predniSONE (DELTASONE) 10 MG tablet Take 1 tablet (10 mg total) by mouth daily with breakfast. 07/03/15  Yes Anson Fret, MD  pyridostigmine (MESTINON) 60 MG tablet Take 1 tablet (60 mg total) by mouth 3 (three) times daily. Patient taking differently: Take 30 mg by mouth 3 (three) times daily.  07/03/15  Yes Anson Fret, MD  senna (SENOKOT) 8.6 MG TABS tablet Take 1 tablet by mouth 2 (two) times daily.    Yes Historical Provider, MD  simvastatin (ZOCOR) 10 MG tablet Take 10 mg by mouth daily.    Yes Historical Provider, MD  traMADol (ULTRAM) 50 MG tablet Take 1 tablet (50 mg total) by mouth every 6 (six) hours as needed for moderate pain. 08/07/15  Yes Seth North, MD     Physical Exam:  Filed Vitals:   11/05/15 1314 11/05/15 1330 11/05/15 1413 11/05/15 1525  BP:   Pulse:  93 88 88  Temp: 100.8 F (38.2 C)   97.5 F (36.4 C)  TempSrc: Rectal   Oral   Resp:  Height:      Weight:      SpO2:  94% 97% 97%    Constitutional: Vital signs reviewed.  Elderly male lying in bed somnolent, not responding to verbal command and winces with painful stimuli HEENT: Pupil reactive bilaterally, moist mucosa, Cardiovascular: RRR, S1 normal, S2 normal, no MRG Chest: CTAB, no wheezes, rales, or rhonchi Abdominal: Soft. Nondistended, winces on pressure, bowel sounds present, suprapubic catheter with no urine in the bag  Ext: warm, no edema Neurological: Somnolent and unarousable. Winces to painful stimuli only  Labs on Admission:  Basic Metabolic Panel:  Recent Labs Lab 11/05/15 1258  NA 144  K 4.5  CL 106  CO2 17*  GLUCOSE 130*  BUN 28*  CREATININE 2.38*  CALCIUM 9.2   Liver Function Tests:  Recent Labs Lab  11/05/15 1258  AST 49*  ALT 28  ALKPHOS 75  BILITOT 0.6  PROT 6.0*  ALBUMIN 2.9*   No results for input(s): LIPASE, AMYLASE in the last 168 hours. No results for input(s): AMMONIA in the last 168 hours. CBC:  Recent Labs Lab 11/05/15 1258  WBC 28.8*  NEUTROABS 25.6*  HGB 15.0  HCT 44.3  MCV 87.0  PLT 247   Cardiac Enzymes: No results for input(s): CKTOTAL, CKMB, CKMBINDEX, TROPONINI in the last 168 hours. BNP: Invalid input(s): POCBNP CBG: No results for input(s): GLUCAP in the last 168 hours.  Radiological Exams on Admission: Dg Chest 2 View  11/05/2015  CLINICAL DATA:  Fever, altered mental status EXAM: CHEST  2 VIEW COMPARISON:  08/02/2015 FINDINGS: Elevated left diaphragm. No pneumothorax or pleural effusion. Bilateral mild interstitial thickening. No focal consolidation. Stable cardiomegaly. No acute osseous abnormality. IMPRESSION: Stable cardiomegaly with pulmonary vascular congestion. Electronically Signed   By: Elige Ko   On: 11/05/2015 14:34    EKG: NSR@96 , borderline ST elevation inferior leads which seems unchanged.  Assessment/Plan  Principal Problem:   Septic shock (HCC) -No  clear source at this time. Could be urinary. Has no output on suprapubic catheter. Bladder scan done showed distant 100 mL in the bladder. Suprapubic catheter was changed yesterday. -Sepsis pathway initiated in the ED. Order 2 L normal saline bolus followed by maintenance none saline at 1 25 mL per hour. Admit to stepdown unit. Empiric antibiotic coverage with IV vancomycin and Zosyn. -Monitor lactate, pro calcitonin. Follow labs closely. -Patient has abdominal tenderness on palpation. I will obtain CT of his abdomen and pelvis without contrast to evaluate further and also check for positioning of a suprapubic catheter. -Discussed in detail with family at bedside. Besides antibiotics, aggressive IV hydration and monitoring did not want to escalate care further which involves any relief measures including use of pressors mechanical ventilations or resuscitations. -Dentist and that patient is very sick and his prognosis is guarded if no significant improvement seen no the next 24-48 hours. -Continue neuro checks and monitor strict I/O.  Active Problems:   Atrial fibrillation (HCC) Rate controlled. Holding metoprolol due to low blood pressure. Holding eliquis.    Acute encephalopathy Secondary to septic shock. Continue neuro checks. Monitor with IV fluid resuscitation and empiric antibiotics.     Acute renal failure superimposed on stage 3 chronic kidney disease (HCC) Possibly prerenal with dehydration and sepsis. Would obtain CT of the abdomen to rule out for any latter outlet obstruction and check for positioning off his suprapubic catheter.    Myasthenia gravis (HCC) Hold home medications. Follows with outpatient neurology.  Failure to thrive and frequent falls Patient uses a walker with 2 person assist at the facility. Has had falls at the  Facility recently without sustaining any injury.   Prognosis is guarded.  Diet:NPO  DVT prophylaxis: SCD   Code Status: DNR Family  Communication: discussed with sons and daughter in law  at bedside Disposition Plan: Admit to stepdown.  Seth [name] [facility] Pager [phone]  Total time spent on admission :70 minutes  If 7PM-7AM, please contact night-coverage [email or web address] Password [email or web address] 11/05/2015, 3:55 PM

## 2015-11-05 NOTE — ED Notes (Signed)
Per EMS, patient is from [facility].  Patient presents with altered mental status that was discovered 4am this morning.  Facility administered Tylenol at 5am today.  No fever since then.  Pt has had 600 ml of NS with EMS and facility.    BP: 88/64 Hr: 100 R:32 96% nasal canal with 4 L   CBG: 140

## 2015-11-06 DIAGNOSIS — G7000 Myasthenia gravis without (acute) exacerbation: Secondary | ICD-10-CM

## 2015-11-06 DIAGNOSIS — R627 Adult failure to thrive: Secondary | ICD-10-CM

## 2015-11-06 DIAGNOSIS — I480 Paroxysmal atrial fibrillation: Secondary | ICD-10-CM

## 2015-11-06 LAB — COMPREHENSIVE METABOLIC PANEL
ALBUMIN: 2.5 g/dL — AB (ref 3.5–5.0)
ALK PHOS: 64 U/L (ref 38–126)
ALT: 22 U/L (ref 17–63)
ANION GAP: 9 (ref 5–15)
AST: 26 U/L (ref 15–41)
BILIRUBIN TOTAL: 0.8 mg/dL (ref 0.3–1.2)
BUN: 25 mg/dL — AB (ref 6–20)
CALCIUM: 7.6 mg/dL — AB (ref 8.9–10.3)
CO2: 21 mmol/L — ABNORMAL LOW (ref 22–32)
CREATININE: 1.49 mg/dL — AB (ref 0.61–1.24)
Chloride: 114 mmol/L — ABNORMAL HIGH (ref 101–111)
GFR calc Af Amer: 46 mL/min — ABNORMAL LOW (ref >60)
GFR calc non Af Amer: 40 mL/min — ABNORMAL LOW (ref >60)
GLUCOSE: 98 mg/dL (ref 65–99)
Potassium: 4.3 mmol/L (ref 3.5–5.1)
Sodium: 144 mmol/L (ref 135–145)
TOTAL PROTEIN: 5.1 g/dL — AB (ref 6.5–8.1)

## 2015-11-06 LAB — URINE MICROSCOPIC-ADD ON: SQUAMOUS EPITHELIAL / LPF: NONE SEEN

## 2015-11-06 LAB — CBC
HCT: 37.9 % — ABNORMAL LOW (ref 39.0–52.0)
Hemoglobin: 12.2 g/dL — ABNORMAL LOW (ref 13.0–17.0)
MCH: 28.5 pg (ref 26.0–34.0)
MCHC: 32.2 g/dL (ref 30.0–36.0)
MCV: 88.6 fL (ref 78.0–100.0)
PLATELETS: 163 10*3/uL (ref 150–400)
RBC: 4.28 MIL/uL (ref 4.22–5.81)
RDW: 16.9 % — AB (ref 11.5–15.5)
WBC: 19.7 10*3/uL — ABNORMAL HIGH (ref 4.0–10.5)

## 2015-11-06 LAB — URINALYSIS, ROUTINE W REFLEX MICROSCOPIC
BILIRUBIN URINE: NEGATIVE
Glucose, UA: NEGATIVE mg/dL
Ketones, ur: NEGATIVE mg/dL
NITRITE: NEGATIVE
PH: 5 (ref 5.0–8.0)
Protein, ur: NEGATIVE mg/dL
SPECIFIC GRAVITY, URINE: 1.018 (ref 1.005–1.030)

## 2015-11-06 LAB — LACTIC ACID, PLASMA: Lactic Acid, Venous: 2.3 mmol/L (ref 0.5–2.0)

## 2015-11-06 MED ORDER — SODIUM CHLORIDE 0.9 % IV BOLUS (SEPSIS)
500.0000 mL | Freq: Once | INTRAVENOUS | Status: AC
  Administered 2015-11-06: 500 mL via INTRAVENOUS

## 2015-11-06 MED ORDER — VANCOMYCIN HCL 10 G IV SOLR
1250.0000 mg | INTRAVENOUS | Status: DC
  Administered 2015-11-06: 1250 mg via INTRAVENOUS
  Filled 2015-11-06: qty 1250

## 2015-11-06 NOTE — Progress Notes (Signed)
Pt went into afib at 0242.  HR between 100-125. MD notified. No orders at this time. Will continue to monitor closely.

## 2015-11-06 NOTE — Progress Notes (Addendum)
eLink Pharmacist-Brief Progress Note Patient Name: Seth [name] DOB: 22-Aug-1925 MRN: [id number]   Date of Service  11/06/2015  HPI/Events of Note  82 y/o M w/ GNR bacteremia. Abx and cx reviewed.   eICU Interventions  D/c vancomycin, continue Zosyn for GNR. F/u sensitivities   eLink Provider: Luci Bank P 11/06/2015, 4:27 PM

## 2015-11-06 NOTE — Progress Notes (Signed)
CRITICAL VALUE ALERT  Critical value received:  Lactic Acid 4.7   Date of notification:  11/05/2015   Time of notification:  2300  Critical value read back:Yes.    Nurse who received alert:  Hartford Poli  MD notified (1st page):  K.Schorr  Time of first page:  2300  MD notified (2nd page):  Time of second page:  Responding MD:  K.Schorr  Time MD responded:  0000

## 2015-11-06 NOTE — Progress Notes (Signed)
Pt. suprapubic catheter noted to be painful to touch and leaking urine upon assessment.  MD notified and aware. No order at this time.  Will continue to monitor.

## 2015-11-06 NOTE — Progress Notes (Signed)
CRITICAL VALUE ALERT  Critical value received:  Blood culture, gram negative rods  Date of notification:  11/06/15  Time of notification:  0605  Critical value read back:Yes.    Nurse who received alert:  Hartford Poli  MD notified (1st page):  K.Schorr  Time of first page:  0615  MD notified (2nd page):  Time of second page:  Responding MD:    Time MD responded:

## 2015-11-06 NOTE — Clinical Social Work Note (Signed)
Clinical Social Work Assessment  Patient Details  Name: Seth [name] MRN: [id number] Date of Birth: Oct 13, 1925  Date of referral:  11/06/15               Reason for consult:  Facility Placement, Discharge Planning                Permission sought to share information with:  Facility Art therapist granted to share information::  Yes, Verbal Permission Granted  Name::        Agency::     Relationship::     Contact Information:     Housing/Transportation Living arrangements for the past 2 months:  [place] of Information:  Adult Children Patient Interpreter Needed:  None Criminal Activity/Legal Involvement Pertinent to Current Situation/Hospitalization:  No - Comment as needed Significant Relationships:  Adult Children Lives with:    Do you feel safe going back to the place where you live?  Yes Need for family participation in patient care:  Yes (Comment)  Care giving concerns:  No concerns reported by family.   Social Worker assessment / plan:  Pt hospitalized from [place] on 11/05/15 with acute encephalopathy. Pt is alert and oriented x 1 and is unable to participate in d/c planning. CSW met with pt's son, Seth [name] [phone], at bedside. Son reports that he would like pt to return to [facility] at d/c. SNF contacted and clinical info sent for review. SNF will readmit when pt is stable for d/c. CSW will continue to follow to assist with d/c planning to SNF.   Employment status:  Retired Forensic scientist:  Medicare PT Recommendations:  Not assessed at this time [place] / Referral to community resources:  [place]  Patient/Family's Response to care:  Son reports continued LTC is needed.  Patient/Family's Understanding of and Emotional Response to Diagnosis, Current Treatment, and Prognosis:  Son is aware of pt's medical status. Son reports that he is satisfied with the care pt is receiving at SNF  and would like him to return to [facility] when stable for d/c.  Emotional Assessment Appearance:  Appears stated age Attitude/Demeanor/Rapport:  Other (cooperative) Affect (typically observed):  Calm, Appropriate Orientation:  Oriented to Self Alcohol / Substance use:  Not Applicable Psych involvement (Current and /or in the community):  No (Comment)  Discharge Needs  Concerns to be addressed:  Discharge Planning Concerns Readmission within the last 30 days:  No Current discharge risk:  None Barriers to Discharge:  No Barriers Identified   Seth [name], [place] 11/06/2015, 3:06 PM

## 2015-11-06 NOTE — Progress Notes (Addendum)
[facility] PROGRESS NOTE  FINBAR NIPPERT BJY:[id number] DOB: [birth year] DOA: 11/05/2015 PCP:  Duane Lope, MD  HPI/Brief narrative 82 year old male with history of hypertension, hyperlipidemia, A. fib on anticoagulation, history of TIAs, multiple falls, neurogenic bladder with suprapubic catheter, mild-to-moderate dementia who was sent from skilled nursing facility after being increasingly confused, found to be floridly septic with UTI.  Assessment/Plan:  UTI vs gram neg bacteremia vs PNA with Septic shock present on admission [facility]) related to suprapubic catheter -Pt presented with leukocytosis, elevated lactate, confusion -CT abd with findings suggestive of B basilar consolidation -2/2 blood cultures pos for gm neg rods -Bladder scan on admit with 100 mL in the bladder. Suprapubic catheter was changed one day before admission at facility. -Sepsis protocol ordered. Pt continued on IVF hydration -Pt is continued on empiric vanc and zosyn   Atrial fibrillation (HCC) -Rate controlled.  -Held eliquis on admit   Acute encephalopathy - Secondary to septic shock.  - Improved   Acute renal failure superimposed on stage 3 chronic kidney disease (HCC) - Improved with IVF hydratino - Suspect secondary to dehydration vs severe sepsis   Myasthenia gravis (HCC) - Held home medications on admit - Follows with outpatient neurology.  Failure to thrive and frequent falls - Patient uses a walker with 2 person assist at the facility.  - Has had falls at the - Facility recently without sustaining any injury.  Code Status: DNR Family Communication: Pt in room, family at bedside Disposition Plan: Pending at this time   Consultants:  Urology  Critical Care  Procedures:    Antibiotics: Anti-infectives    Start     Dose/Rate Route Frequency Ordered Stop   11/07/15 1600  vancomycin (VANCOCIN) IVPB 1000 mg/200 mL premix  Status:  Discontinued     1,000 mg 200 mL/hr over 60  Minutes Intravenous Every 48 hours 11/05/15 1609 11/06/15 0940   11/06/15 1200  vancomycin (VANCOCIN) 1,250 mg in sodium chloride 0.9 % 250 mL IVPB     1,250 mg 166.7 mL/hr over 90 Minutes Intravenous Every 24 hours 11/06/15 0940     11/05/15 2200  piperacillin-tazobactam (ZOSYN) IVPB 3.375 g     3.375 g 12.5 mL/hr over 240 Minutes Intravenous Every 8 hours 11/05/15 1609     11/05/15 1400  piperacillin-tazobactam (ZOSYN) IVPB 3.375 g     3.375 g 12.5 mL/hr over 240 Minutes Intravenous  Once 11/05/15 1348 11/05/15 1747   11/05/15 1400  vancomycin (VANCOCIN) IVPB 1000 mg/200 mL premix     1,000 mg 200 mL/hr over 60 Minutes Intravenous  Once 11/05/15 1348 11/05/15 1723      HPI/Subjective: Reports feeling better today  Objective: Filed Vitals:   11/06/15 0900 11/06/15 1000 11/06/15 1100 11/06/15 1200  BP: 111/58 135/71 119/65 125/86  Pulse: 105 119 108 107  Temp:      TempSrc:      Resp: 20 27 33 23  Height:      Weight:      SpO2: 97% 98% 98% 99%    Intake/Output Summary (Last 24 hours) at 11/06/15 1532 Last data filed at 11/06/15 0610  Gross per 24 hour  Intake   2350 ml  Output      0 ml  Net   2350 ml   Filed Weights   11/05/15 1244 11/05/15 1825  Weight: 77.111 kg (170 lb) 84.8 kg (186 lb 15.2 oz)    Exam:   General:  Awake, in nad  Cardiovascular: regular, s1, s2  Respiratory: normal resp effort, no wheezing  Abdomen: soft,nondistended  Musculoskeletal: perfused,no clubbing   Data Reviewed: Basic Metabolic Panel:  Recent Labs Lab 11/05/15 1258 11/06/15 0330  NA 144 144  K 4.5 4.3  CL 106 114*  CO2 17* 21*  GLUCOSE 130* 98  BUN 28* 25*  CREATININE 2.38* 1.49*  CALCIUM 9.2 7.6*   Liver Function Tests:  Recent Labs Lab 11/05/15 1258 11/06/15 0330  AST 49* 26  ALT 28 22  ALKPHOS 75 64  BILITOT 0.6 0.8  PROT 6.0* 5.1*  ALBUMIN 2.9* 2.5*   No results for input(s): LIPASE, AMYLASE in the last 168 hours. No results for input(s):  AMMONIA in the last 168 hours. CBC:  Recent Labs Lab 11/05/15 1258 11/06/15 0330  WBC 28.8* 19.7*  NEUTROABS 25.6*  --   HGB 15.0 12.2*  HCT 44.3 37.9*  MCV 87.0 88.6  PLT 247 163   Cardiac Enzymes: No results for input(s): CKTOTAL, CKMB, CKMBINDEX, TROPONINI in the last 168 hours. BNP (last 3 results)  Recent Labs  01/17/15 0149  BNP 577.8*    ProBNP (last 3 results) No results for input(s): PROBNP in the last 8760 hours.  CBG: No results for input(s): GLUCAP in the last 168 hours.  Recent Results (from the past 240 hour(s))  Culture, blood (routine x 2)     Status: None (Preliminary result)   Collection Time: 11/05/15  1:05 PM  Result Value Ref Range Status   Specimen Description BLOOD LEFT HAND  Final   Special Requests IN PEDIATRIC BOTTLE  Final   Culture  Setup Time   Final    GRAM NEGATIVE RODS IN PEDIATRIC BOTTLE CRITICAL RESULT CALLED TO, READ BACK BY AND VERIFIED WITH: M [facility]  11/06/15 MKELLY    Culture   Final    NO GROWTH < 24 HOURS Performed at [facility]    Report Status PENDING  Incomplete  Culture, blood (routine x 2)     Status: None (Preliminary result)   Collection Time: 11/05/15  1:08 PM  Result Value Ref Range Status   Specimen Description BLOOD RIGHT HAND  Final   Special Requests   Final    BOTTLES DRAWN AEROBIC AND ANAEROBIC BLUE 5CC RED 4CC   Culture  Setup Time   Final    GRAM NEGATIVE RODS IN BOTH AEROBIC AND ANAEROBIC BOTTLES CRITICAL RESULT CALLED TO, READ BACK BY AND VERIFIED WITH: Ervin Knack AT 0812 ON [id number] BY Lucienne Capers    Culture   Final    Romie Minus NEGATIVE RODS Performed at [facility]    Report Status PENDING  Incomplete  MRSA PCR Screening     Status: None   Collection Time: 11/05/15  6:27 PM  Result Value Ref Range Status   MRSA by PCR NEGATIVE NEGATIVE Final    Comment:        The GeneXpert MRSA Assay (FDA approved for NASAL specimens only), is one component of a comprehensive  MRSA colonization surveillance program. It is not intended to diagnose MRSA infection nor to guide or monitor treatment for MRSA infections.      Studies: Ct Abdomen Pelvis Wo Contrast  11/05/2015  CLINICAL DATA:  Altered mental status with abdominal tenderness EXAM: CT ABDOMEN AND PELVIS WITHOUT CONTRAST TECHNIQUE: Multidetector CT imaging of the abdomen and pelvis was performed following the standard protocol without IV contrast. COMPARISON:  August 03, 2015 FINDINGS: Lower chest: There are small pleural effusions bilaterally with patchy consolidation in  both lung bases, more on the left than on the right. There are multiple foci of coronary artery calcification. Hepatobiliary: There is a 1 x 1 cm probable cyst in the posterior segment of the right lobe of the liver, stable. No other focal liver lesions are identified on this noncontrast enhanced study. There is cholelithiasis. The gallbladder wall does not appear thickened. There is no biliary duct dilatation. Pancreas: There is no pancreatic mass or inflammatory focus. Spleen: No splenic lesions are identified. Adrenals/Urinary Tract: Adrenals appear unremarkable bilaterally. Kidneys bilaterally contain moderate renal sinus fat bilaterally. There is no renal mass or hydronephrosis on either side. There is no renal or ureteral calculus on either side. The urinary bladder is midline with wall thickness within normal limits. A suprapubic catheter passes through the bladder with the balloon expanded in the region of the urethra, distal to the bladder neck. A small amount of air within the bladder is likely of iatrogenic etiology. Stomach/Bowel: Rectum is mildly distended with stool. There is slight wall thickening of the rectum with mild perirectal stranding, findings that may be due to chronic distention with stool. There are multiple sigmoid diverticula without diverticulitis. Scattered diverticula are noted elsewhere in the colon without  diverticulitis. There is no bowel wall or mesenteric thickening. No bowel obstruction. No free air or portal venous air. No bowel pneumatosis. Vascular/Lymphatic: There is atherosclerotic calcification in the aorta. No aneurysm. There is moderate calcification at the origins of the major mesenteric vessels. There is no demonstrable adenopathy in the abdomen or pelvis. Reproductive: Prostate is mildly enlarged. The seminal vesicles appear normal. No pelvic mass or fluid collection. Other: Appendix region appears normal. No abscess or ascites in the abdomen or pelvis. Musculoskeletal: There is extensive mixed sclerotic and lytic change throughout the sacral. There is coarse cortical thickening in the right iliac crest. No new bone lesions are identified. There is periarticular osteoporosis in the hip regions. There is no intramuscular or abdominal wall lesion. IMPRESSION: Suprapubic catheter tip is in the urethra, inferior to the bladder neck. A small amount of air in the bladder is likely of iatrogenic etiology. Prostate remains enlarged. The previously noted air and inflammation in the region of the penis and perineum have resolved. There is mild dilatation of the wall of the rectum with mild rectal wall thickening and mild perirectal stranding, likely due to chronic inflammation due to chronic rectal distention from stool. There is bibasilar airspace consolidation in the lungs, more on the left than on the right. Small pleural effusions bilaterally. Cholelithiasis.  Gallbladder wall not thickened. Multiple colonic diverticula without diverticulitis. No bowel obstruction. No abscess. Appendix region appears unremarkable. No renal or ureteral calculi. No hydronephrosis. Stable bony changes in the pelvis and sacrum. 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 Electronically Signed   By: Bretta Bang III M.D.   On: 11/05/2015 17:56   Dg Chest 2 View  11/05/2015  CLINICAL DATA:  Fever, altered mental status EXAM: CHEST  2 VIEW  COMPARISON:  08/02/2015 FINDINGS: Elevated left diaphragm. No pneumothorax or pleural effusion. Bilateral mild interstitial thickening. No focal consolidation. Stable cardiomegaly. No acute osseous abnormality. IMPRESSION: Stable cardiomegaly with pulmonary vascular congestion. Electronically Signed   By: Elige Ko   On: 11/05/2015 14:34    Scheduled Meds: . bisacodyl  10 mg Rectal Daily  . piperacillin-tazobactam (ZOSYN)  IV  3.375 g Intravenous Q8H  . sodium chloride  3 mL Intravenous Q12H  . vancomycin  1,250 mg Intravenous Q24H   Continuous Infusions: . sodium  chloride 125 mL/hr at 11/06/15 1249    Principal Problem:   Septic shock (HCC) Active Problems:   Atrial fibrillation (HCC)   Acute encephalopathy   Acute renal failure superimposed on stage 3 chronic kidney disease (HCC)   Myasthenia gravis (HCC)   Hypotension   FTT (failure to thrive) in adult   Neurogenic bladder   Frequent falls   Mayara Paulson K  [facility] Pager [phone]. If 7PM-7AM, please contact night-coverage at [email or web address], password [facility] 11/06/2015, 3:32 PM  LOS: 1 day

## 2015-11-06 NOTE — Consult Note (Signed)
I was called to change the patient's suprapubic tube.  I have put a care order instruction in for the fourth floor nurses to perform this task, as this is done on in patients by the urology nurses.

## 2015-11-06 NOTE — Progress Notes (Signed)
Pharmacy Antibiotic Follow-up Note  Seth [name] is a 82 y.o. year-old male admitted on 11/05/2015.  The patient is currently on day 2 of ? for sepsis.  Assessment/Plan: SCr improved overnight, CrCl now ~35ml/min. Increase Vancomycin to  IV q24h. Continue Zosyn 3.375g IV Q8H infused over 4hrs.   Temp (24hrs), Avg:98.5 F (36.9 C), Min:97.4 F (36.3 C), Max:100.8 F (38.2 C)   Recent Labs Lab 11/05/15 1258 11/06/15 0330  WBC 28.8* 19.7*    Recent Labs Lab 11/05/15 1258 11/06/15 0330  CREATININE 2.38* 1.49*   Estimated Creatinine Clearance: 36.5 mL/min (by C-G formula based on Cr of 1.49).    Allergies  Allergen Reactions  . Ceftin [Cefuroxime] Diarrhea    Severe diarrhea    Antimicrobials this admission: /17 >> Vanc >> 1/17 >> Zosyn >>  Levels/dose changes this admission:   Microbiology results: 1/17 bcx x2: GNR 1/17 ucx: 1/17 MRSA PCR: neg  Thank you for allowing pharmacy to be a part of this patient's care.  Charolotte Eke, PharmD, pager [phone]. 11/06/2015,9:45 AM.

## 2015-11-06 NOTE — Clinical Documentation Improvement (Signed)
Hospitalist  A cause and effect relationship may not be assumed and must be documented by a provider.  Please clarify the relationship, if any, between UTI and suprapubic catheter.  Are the conditions:   Due to or associated with each other  Unrelated to each other  Other  Clinically Undetermined   Supporting Information (risk factors, sign and symptoms, diagnostics, treatment): UTI noted per 01/18 progress notes.  Suprapubic catheter changed one day prior to admission per 01/18 progress notes.   Please exercise your independent, professional judgment when responding. A specific answer is not anticipated or expected.   Thank Sabino Donovan [employer] [facility] [place] [phone]

## 2015-11-07 DIAGNOSIS — I481 Persistent atrial fibrillation: Secondary | ICD-10-CM

## 2015-11-07 LAB — BASIC METABOLIC PANEL
ANION GAP: 8 (ref 5–15)
BUN: 18 mg/dL (ref 6–20)
CHLORIDE: 114 mmol/L — AB (ref 101–111)
CO2: 23 mmol/L (ref 22–32)
Calcium: 8.5 mg/dL — ABNORMAL LOW (ref 8.9–10.3)
Creatinine, Ser: 1.02 mg/dL (ref 0.61–1.24)
GFR calc non Af Amer: >60 mL/min (ref >60)
Glucose, Bld: 121 mg/dL — ABNORMAL HIGH (ref 65–99)
POTASSIUM: 4.1 mmol/L (ref 3.5–5.1)
SODIUM: 145 mmol/L (ref 135–145)

## 2015-11-07 LAB — LACTIC ACID, PLASMA: Lactic Acid, Venous: 1.4 mmol/L (ref 0.5–2.0)

## 2015-11-07 LAB — CBC
HEMATOCRIT: 39.9 % (ref 39.0–52.0)
Hemoglobin: 12.5 g/dL — ABNORMAL LOW (ref 13.0–17.0)
MCH: 28.4 pg (ref 26.0–34.0)
MCHC: 31.3 g/dL (ref 30.0–36.0)
MCV: 90.7 fL (ref 78.0–100.0)
PLATELETS: 159 10*3/uL (ref 150–400)
RBC: 4.4 MIL/uL (ref 4.22–5.81)
RDW: 17 % — ABNORMAL HIGH (ref 11.5–15.5)
WBC: 12.1 10*3/uL — AB (ref 4.0–10.5)

## 2015-11-07 MED ORDER — LABETALOL HCL 5 MG/ML IV SOLN: 5.0000 mg | INTRAVENOUS | Status: DC | PRN

## 2015-11-07 MED ORDER — METOPROLOL SUCCINATE ER 50 MG PO TB24
50.0000 mg | ORAL_TABLET | Freq: Every morning | ORAL | Status: DC
  Administered 2015-11-07 – 2015-11-09 (×3): 50 mg via ORAL
  Filled 2015-11-07: qty 2
  Filled 2015-11-07: qty 1
  Filled 2015-11-07: qty 2

## 2015-11-07 NOTE — Progress Notes (Addendum)
[facility] PROGRESS NOTE  Seth [name] RUE:[id number] DOB: 07-15-1925 DOA: 11/05/2015 PCP:  Duane Lope, MD  HPI/Brief narrative 82 year old male with history of hypertension, hyperlipidemia, A. fib on anticoagulation, history of TIAs, multiple falls, neurogenic bladder with suprapubic catheter, mild-to-moderate dementia who was sent from skilled nursing facility after being increasingly confused, found to be floridly septic with UTI.  Assessment/Plan:  UTI vs gram neg bacteremia vs PNA with Septic shock present on admission [facility]) related to suprapubic catheter -Pt presented with leukocytosis, elevated lactate, confusion -CT abd with findings suggestive of B basilar consolidation -2/2 blood cultures pos for gm neg rods -Bladder scan on admit with 100 mL in the bladder. Suprapubic catheter was changed one day before admission at facility. -Sepsis protocol ordered. Pt continued on IVF hydration -Pt is continued on empiric vanc and zosyn for the time being -WBC improving. Afebrile. Encephalopathy improving   Atrial fibrillation (HCC) -Becoming tachycardic, concerning for RVR -Add PRN IV labetalol -Resume home meds, including beta blocker and anticoagulation as patient is able to tolerate PO   Acute encephalopathy - Secondary to septic shock.  - Improved   Acute renal failure superimposed on stage 3 chronic kidney disease (HCC) - Improved with IVF hydratinon - Suspect secondary to dehydration vs severe sepsis   Myasthenia gravis (HCC) - Held home medications on admit - Follows with outpatient neurology.  Failure to thrive and frequent falls - Patient uses a walker with 2 person assist at the facility.  - Has had falls at thefacility recently without sustaining any injury.  Code Status: DNR Family Communication: Pt in room, family at bedside Disposition Plan: Possible d/c in 2-3 days   Consultants:  Urology  Critical  Care  Procedures:    Antibiotics: Anti-infectives    Start     Dose/Rate Route Frequency Ordered Stop   11/07/15 1600  vancomycin (VANCOCIN) IVPB 1000 mg/200 mL premix  Status:  Discontinued     1,000 mg 200 mL/hr over 60 Minutes Intravenous Every 48 hours 11/05/15 1609 11/06/15 0940   11/06/15 1200  vancomycin (VANCOCIN) 1,250 mg in sodium chloride 0.9 % 250 mL IVPB  Status:  Discontinued     1,250 mg 166.7 mL/hr over 90 Minutes Intravenous Every 24 hours 11/06/15 0940 11/06/15 1629   11/05/15 2200  piperacillin-tazobactam (ZOSYN) IVPB 3.375 g     3.375 g 12.5 mL/hr over 240 Minutes Intravenous Every 8 hours 11/05/15 1609     11/05/15 1400  piperacillin-tazobactam (ZOSYN) IVPB 3.375 g     3.375 g 12.5 mL/hr over 240 Minutes Intravenous  Once 11/05/15 1348 11/05/15 1747   11/05/15 1400  vancomycin (VANCOCIN) IVPB 1000 mg/200 mL premix     1,000 mg 200 mL/hr over 60 Minutes Intravenous  Once 11/05/15 1348 11/05/15 1723      HPI/Subjective: No complaints. States feeling better  Objective: Filed Vitals:   11/07/15 1000 11/07/15 1100 11/07/15 1200 11/07/15 1400  BP: 128/65  121/76 118/46  Pulse: 93 101 113 48  Temp:   97.8 F (36.6 C)   TempSrc:   Oral   Resp: Height:      Weight:      SpO2: 95% 96% 96% 96%    Intake/Output Summary (Last 24 hours) at 11/07/15 1455 Last data filed at 11/07/15 1254  Gross per 24 hour  Intake   1925 ml  Output   1500 ml  Net    425 ml   Filed Weights   11/05/15  1244 11/05/15 1825  Weight: 77.111 kg (170 lb) 84.8 kg (186 lb 15.2 oz)    Exam:   General:  Awake, in nad, laying in bed  Cardiovascular: regular, s1, s2  Respiratory: normal resp effort, no wheezing  Abdomen: soft,nondistended, pos BS  Musculoskeletal: perfused,no clubbing, no cyanosis  Data Reviewed: Basic Metabolic Panel:  Recent Labs Lab 11/05/15 1258 11/06/15 0330 11/07/15 0334  NA 144 144 145  K 4.5 4.3 4.1  CL 106 114* 114*  CO2 17*  21* 23  GLUCOSE 130* 98 121*  BUN 28* 25* 18  CREATININE 2.38* 1.49* 1.02  CALCIUM 9.2 7.6* 8.5*   Liver Function Tests:  Recent Labs Lab 11/05/15 1258 11/06/15 0330  AST 49* 26  ALT 28 22  ALKPHOS 75 64  BILITOT 0.6 0.8  PROT 6.0* 5.1*  ALBUMIN 2.9* 2.5*   No results for input(s): LIPASE, AMYLASE in the last 168 hours. No results for input(s): AMMONIA in the last 168 hours. CBC:  Recent Labs Lab 11/05/15 1258 11/06/15 0330 11/07/15 0334  WBC 28.8* 19.7* 12.1*  NEUTROABS 25.6*  --   --   HGB 15.0 12.2* 12.5*  HCT 44.3 37.9* 39.9  MCV 87.0 88.6 90.7  PLT 247 163 159   Cardiac Enzymes: No results for input(s): CKTOTAL, CKMB, CKMBINDEX, TROPONINI in the last 168 hours. BNP (last 3 results)  Recent Labs  01/17/15 0149  BNP 577.8*    ProBNP (last 3 results) No results for input(s): PROBNP in the last 8760 hours.  CBG: No results for input(s): GLUCAP in the last 168 hours.  Recent Results (from the past 240 hour(s))  Culture, blood (routine x 2)     Status: None (Preliminary result)   Collection Time: 11/05/15  1:05 PM  Result Value Ref Range Status   Specimen Description BLOOD LEFT HAND  Final   Special Requests IN PEDIATRIC BOTTLE  Final   Culture  Setup Time   Final    GRAM NEGATIVE RODS IN PEDIATRIC BOTTLE CRITICAL RESULT CALLED TO, READ BACK BY AND VERIFIED WITH: M [facility] @0604  11/06/15 MKELLY    Culture   Final    GRAM NEGATIVE RODS Performed at [facility]    Report Status PENDING  Incomplete  Culture, blood (routine x 2)     Status: None (Preliminary result)   Collection Time: 11/05/15  1:08 PM  Result Value Ref Range Status   Specimen Description BLOOD RIGHT HAND  Final   Special Requests   Final    BOTTLES DRAWN AEROBIC AND ANAEROBIC BLUE 5CC RED 4CC   Culture  Setup Time   Final    GRAM NEGATIVE RODS IN BOTH AEROBIC AND ANAEROBIC BOTTLES CRITICAL RESULT CALLED TO, READ BACK BY AND VERIFIED WITH: Ervin Knack AT 0812 ON  [id number] BY Lucienne Capers    Culture   Final    GRAM NEGATIVE RODS Performed at [facility]    Report Status PENDING  Incomplete  MRSA PCR Screening     Status: None   Collection Time: 11/05/15  6:27 PM  Result Value Ref Range Status   MRSA by PCR NEGATIVE NEGATIVE Final    Comment:        The GeneXpert MRSA Assay (FDA approved for NASAL specimens only), is one component of a comprehensive MRSA colonization surveillance program. It is not intended to diagnose MRSA infection nor to guide or monitor treatment for MRSA infections.      Studies: Ct Abdomen Pelvis Wo  Contrast  11/05/2015  CLINICAL DATA:  Altered mental status with abdominal tenderness EXAM: CT ABDOMEN AND PELVIS WITHOUT CONTRAST TECHNIQUE: Multidetector CT imaging of the abdomen and pelvis was performed following the standard protocol without IV contrast. COMPARISON:  August 03, 2015 FINDINGS: Lower chest: There are small pleural effusions bilaterally with patchy consolidation in both lung bases, more on the left than on the right. There are multiple foci of coronary artery calcification. Hepatobiliary: There is a 1 x 1 cm probable cyst in the posterior segment of the right lobe of the liver, stable. No other focal liver lesions are identified on this noncontrast enhanced study. There is cholelithiasis. The gallbladder wall does not appear thickened. There is no biliary duct dilatation. Pancreas: There is no pancreatic mass or inflammatory focus. Spleen: No splenic lesions are identified. Adrenals/Urinary Tract: Adrenals appear unremarkable bilaterally. Kidneys bilaterally contain moderate renal sinus fat bilaterally. There is no renal mass or hydronephrosis on either side. There is no renal or ureteral calculus on either side. The urinary bladder is midline with wall thickness within normal limits. A suprapubic catheter passes through the bladder with the balloon expanded in the region of the urethra, distal to the  bladder neck. A small amount of air within the bladder is likely of iatrogenic etiology. Stomach/Bowel: Rectum is mildly distended with stool. There is slight wall thickening of the rectum with mild perirectal stranding, findings that may be due to chronic distention with stool. There are multiple sigmoid diverticula without diverticulitis. Scattered diverticula are noted elsewhere in the colon without diverticulitis. There is no bowel wall or mesenteric thickening. No bowel obstruction. No free air or portal venous air. No bowel pneumatosis. Vascular/Lymphatic: There is atherosclerotic calcification in the aorta. No aneurysm. There is moderate calcification at the origins of the major mesenteric vessels. There is no demonstrable adenopathy in the abdomen or pelvis. Reproductive: Prostate is mildly enlarged. The seminal vesicles appear normal. No pelvic mass or fluid collection. Other: Appendix region appears normal. No abscess or ascites in the abdomen or pelvis. Musculoskeletal: There is extensive mixed sclerotic and lytic change throughout the sacral. There is coarse cortical thickening in the right iliac crest. No new bone lesions are identified. There is periarticular osteoporosis in the hip regions. There is no intramuscular or abdominal wall lesion. IMPRESSION: Suprapubic catheter tip is in the urethra, inferior to the bladder neck. A small amount of air in the bladder is likely of iatrogenic etiology. Prostate remains enlarged. The previously noted air and inflammation in the region of the penis and perineum have resolved. There is mild dilatation of the wall of the rectum with mild rectal wall thickening and mild perirectal stranding, likely due to chronic inflammation due to chronic rectal distention from stool. There is bibasilar airspace consolidation in the lungs, more on the left than on the right. Small pleural effusions bilaterally. Cholelithiasis.  Gallbladder wall not thickened. Multiple colonic  diverticula without diverticulitis. No bowel obstruction. No abscess. Appendix region appears unremarkable. No renal or ureteral calculi. No hydronephrosis. Stable bony changes in the pelvis and sacrum. 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 Electronically Signed   By: Bretta Bang III M.D.   On: 11/05/2015 17:56    Scheduled Meds: . bisacodyl  10 mg Rectal Daily  . metoprolol succinate  50 mg Oral q morning - 10a  . piperacillin-tazobactam (ZOSYN)  IV  3.375 g Intravenous Q8H  . sodium chloride  3 mL Intravenous Q12H   Continuous Infusions: . sodium chloride 125 mL/hr at 11/07/15 1354  Principal Problem:   Septic shock (HCC) Active Problems:   Atrial fibrillation (HCC)   Acute encephalopathy   Acute renal failure superimposed on stage 3 chronic kidney disease (HCC)   Myasthenia gravis (HCC)   Hypotension   FTT (failure to thrive) in adult   Neurogenic bladder   Frequent falls   Taeko Schaffer K  [facility] Pager [phone]. If 7PM-7AM, please contact night-coverage at [email or web address], password [facility] 11/07/2015, 2:55 PM  LOS: 2 days

## 2015-11-07 NOTE — Progress Notes (Signed)
Date: November 07, 2015 Chart reviewed for concurrent status and case management needs. Will continue to follow patient for changes and needs: Rhonda Davis, RN, BSN, CCM   [phone] 

## 2015-11-08 ENCOUNTER — Inpatient Hospital Stay (HOSPITAL_COMMUNITY): Payer: Medicare Other

## 2015-11-08 LAB — BASIC METABOLIC PANEL WITH GFR
Anion gap: 8 (ref 5–15)
BUN: 13 mg/dL (ref 6–20)
CO2: 22 mmol/L (ref 22–32)
Calcium: 8.2 mg/dL — ABNORMAL LOW (ref 8.9–10.3)
Chloride: 109 mmol/L (ref 101–111)
Creatinine, Ser: 0.84 mg/dL (ref 0.61–1.24)
GFR calc Af Amer: >60 mL/min
GFR calc non Af Amer: >60 mL/min
Glucose, Bld: 117 mg/dL — ABNORMAL HIGH (ref 65–99)
Potassium: 3.7 mmol/L (ref 3.5–5.1)
Sodium: 139 mmol/L (ref 135–145)

## 2015-11-08 LAB — CBC
HCT: 37.4 % — ABNORMAL LOW (ref 39.0–52.0)
Hemoglobin: 12 g/dL — ABNORMAL LOW (ref 13.0–17.0)
MCH: 28.7 pg (ref 26.0–34.0)
MCHC: 32.1 g/dL (ref 30.0–36.0)
MCV: 89.5 fL (ref 78.0–100.0)
Platelets: 157 10*3/uL (ref 150–400)
RBC: 4.18 MIL/uL — ABNORMAL LOW (ref 4.22–5.81)
RDW: 16.8 % — ABNORMAL HIGH (ref 11.5–15.5)
WBC: 9.6 10*3/uL (ref 4.0–10.5)

## 2015-11-08 LAB — URINE CULTURE: Culture: 4000

## 2015-11-08 MED ORDER — ENSURE ENLIVE PO LIQD
237.0000 mL | Freq: Two times a day (BID) | ORAL | Status: DC
Start: 2015-11-08 — End: 2015-11-09
  Administered 2015-11-08 (×2): 237 mL via ORAL

## 2015-11-08 MED ORDER — POLYETHYLENE GLYCOL 3350 17 G PO PACK: 17.0000 g | PACK | Freq: Every day | ORAL | Status: DC | PRN

## 2015-11-08 MED ORDER — SIMVASTATIN 10 MG PO TABS
10.0000 mg | ORAL_TABLET | Freq: Every day | ORAL | Status: DC
  Administered 2015-11-08 – 2015-11-09 (×2): 10 mg via ORAL
  Filled 2015-11-08 (×2): qty 1

## 2015-11-08 MED ORDER — TRAMADOL HCL 50 MG PO TABS: 50.0000 mg | ORAL_TABLET | Freq: Four times a day (QID) | ORAL | Status: DC | PRN

## 2015-11-08 MED ORDER — MIRTAZAPINE 15 MG PO TABS
7.5000 mg | ORAL_TABLET | Freq: Every day | ORAL | Status: DC
  Administered 2015-11-09: 7.5 mg via ORAL
  Filled 2015-11-08: qty 1

## 2015-11-08 MED ORDER — PREDNISONE 5 MG PO TABS
10.0000 mg | ORAL_TABLET | Freq: Every day | ORAL | Status: DC
  Administered 2015-11-08 – 2015-11-09 (×2): 10 mg via ORAL
  Filled 2015-11-08: qty 1
  Filled 2015-11-08: qty 2

## 2015-11-08 MED ORDER — SODIUM CHLORIDE 0.9 % IJ SOLN
10.0000 mL | INTRAMUSCULAR | Status: DC | PRN
  Administered 2015-11-09: 10 mL
  Filled 2015-11-08: qty 40

## 2015-11-08 MED ORDER — PYRIDOSTIGMINE BROMIDE 60 MG PO TABS
30.0000 mg | ORAL_TABLET | Freq: Three times a day (TID) | ORAL | Status: DC
  Administered 2015-11-08 – 2015-11-09 (×4): 30 mg via ORAL
  Filled 2015-11-08 (×7): qty 0.5

## 2015-11-08 MED ORDER — APIXABAN 2.5 MG PO TABS
2.5000 mg | ORAL_TABLET | Freq: Two times a day (BID) | ORAL | Status: DC
  Administered 2015-11-08 – 2015-11-09 (×3): 2.5 mg via ORAL
  Filled 2015-11-08 (×3): qty 1

## 2015-11-08 MED ORDER — DEXTROSE 5 % IV SOLN
2.0000 g | INTRAVENOUS | Status: DC
  Administered 2015-11-08: 2 g via INTRAVENOUS
  Filled 2015-11-08 (×2): qty 2

## 2015-11-08 MED ORDER — SODIUM CHLORIDE 0.9 % IJ SOLN
10.0000 mL | Freq: Two times a day (BID) | INTRAMUSCULAR | Status: DC
  Administered 2015-11-09: 10 mL

## 2015-11-08 MED ORDER — MIRABEGRON ER 25 MG PO TB24
25.0000 mg | ORAL_TABLET | Freq: Every day | ORAL | Status: DC
  Administered 2015-11-08 – 2015-11-09 (×2): 25 mg via ORAL
  Filled 2015-11-08 (×2): qty 1

## 2015-11-08 NOTE — Progress Notes (Signed)
Peripherally Inserted Central Catheter/Midline Placement  The IV Nurse has discussed with the patient and/or persons authorized to consent for the patient, the purpose of this procedure and the potential benefits and risks involved with this procedure.  The benefits include less needle sticks, lab draws from the catheter and patient may be discharged home with the catheter.  Risks include, but not limited to, infection, bleeding, blood clot (thrombus formation), and puncture of an artery; nerve damage and irregular heat beat.  Alternatives to this procedure were also discussed. Son Jonthan Leite gave phone consent. PICC/Midline Placement Documentation  PICC Single Lumen 11/08/15 PICC Right Basilic 44 cm 0 cm (Active)  Indication for Insertion or Continuance of Line Home intravenous therapies (PICC only) 11/08/2015  5:37 PM  Exposed Catheter (cm) 0 cm 11/08/2015  5:37 PM  Line Status Flushed;Saline locked;Blood return noted 11/08/2015  5:37 PM  Dressing Type Transparent 11/08/2015  5:37 PM  Dressing Change Due 11/15/15 11/08/2015  5:37 PM       Ethelda Chick 11/08/2015, 5:39 PM

## 2015-11-08 NOTE — Progress Notes (Signed)
Pharmacy Antibiotic Follow-up Note  Seth [name] is a 82 y.o. year-old male admitted on 11/05/2015.  The patient is currently on day 4 of Zosyn for Klebsiella bacteremia and possible pneumonia.  Assessment/Plan:  After discussion with Dr. Rhona Leavens, the current antibiotic(s) Zosyn will be narrowed to Ceftriaxone and continued for a total of 10 days.  The stop date is entered and will be 1/26.   Ceftriaxone 2g IV q24h  Temp (24hrs), Avg:97.8 F (36.6 C), Min:97.4 F (36.3 C), Max:98.4 F (36.9 C)   Recent Labs Lab 11/05/15 1258 11/06/15 0330 11/07/15 0334 11/08/15 0436  WBC 28.8* 19.7* 12.1* 9.6    Recent Labs Lab 11/05/15 1258 11/06/15 0330 11/07/15 0334 11/08/15 0436  CREATININE 2.38* 1.49* 1.02 0.84   Estimated Creatinine Clearance: 64.7 mL/min (by C-G formula based on Cr of 0.84).    Allergies  Allergen Reactions  . Ceftin [Cefuroxime] Diarrhea    Severe diarrhea    Antimicrobials this admission: 1/17 >> Vanc >> 1/18 1/17 >> Zosyn >> 1/20 1/20 >> Ceftriaxone >> (1/26)  Levels/dose changes this admission: 1/18: SCr improved, increased Vanc to  q24h.  Microbiology results: 1/17 bcx x2: Klebsiella Pneumoniae (pan-sens except ampicillin) 1/17 ucx: 4k colonies, insig growth 1/17 MRSA: negative  Thank you for allowing pharmacy to be a part of this patient's care.  Lynann Beaver PharmD, BCPS Pager [phone] 11/08/2015 2:23 PM

## 2015-11-08 NOTE — Progress Notes (Signed)
[facility] PROGRESS NOTE  Seth [name] ZOX:[id number] DOB: July 22, 1925 DOA: 11/05/2015 PCP:  Duane Lope, MD  HPI/Brief narrative 82 year old male with history of hypertension, hyperlipidemia, A. fib on anticoagulation, history of TIAs, multiple falls, neurogenic bladder with suprapubic catheter, mild-to-moderate dementia who was sent from skilled nursing facility after being increasingly confused, found to be floridly septic with UTI.  Assessment/Plan:  UTI vs gram neg bacteremia vs PNA with Septic shock present on admission [facility]) related to suprapubic catheter -Pt presented with leukocytosis, elevated lactate, confusion -CT abd with findings also suggestive of B basilar consolidation -2/2 blood cultures pos for klebsiella, urine cx with insignificant growth however urine cx was obtained AFTER broad spectrum abx were started -Suprapubic catheter was changed one day before admission at facility. -Patient was initially continued on empiric vanc and zosyn for the time being -WBC normalized (was near 30k on admit). Afebrile. Encephalopathy resolved -discussed case with ID who recommends continued IV rocephin for total of 10 days of abx (stop date through 1/26)   Atrial fibrillation (HCC) -Recently in RVR, resolved with resumption of beta blocker -Add PRN IV labetalol -continue anticoagulation   Acute encephalopathy - Secondary to septic shock.  - Improved   Acute renal failure superimposed on stage 3 chronic kidney disease (HCC) - Improved with IVF hydratinon - Suspect secondary to dehydration vs severe sepsis   Myasthenia gravis (HCC) - Held home medications on admit - Follows with outpatient neurology.  Failure to thrive and frequent falls - Patient uses a walker with 2 person assist at the facility.  - Has had falls at thefacility recently without sustaining any injury.  Code Status: DNR Family Communication: Pt in room, family at bedside Disposition Plan:  Possible d/c tomorrow   Consultants:  Urology  Critical Care  Procedures:  PICC  Antibiotics: Anti-infectives    Start     Dose/Rate Route Frequency Ordered Stop   11/07/15 1600  vancomycin (VANCOCIN) IVPB 1000 mg/200 mL premix  Status:  Discontinued     1,000 mg 200 mL/hr over 60 Minutes Intravenous Every 48 hours 11/05/15 1609 11/06/15 0940   11/06/15 1200  vancomycin (VANCOCIN) 1,250 mg in sodium chloride 0.9 % 250 mL IVPB  Status:  Discontinued     1,250 mg 166.7 mL/hr over 90 Minutes Intravenous Every 24 hours 11/06/15 0940 11/06/15 1629   11/05/15 2200  piperacillin-tazobactam (ZOSYN) IVPB 3.375 g  Status:  Discontinued     3.375 g 12.5 mL/hr over 240 Minutes Intravenous Every 8 hours 11/05/15 1609 11/08/15 1352   11/05/15 1400  piperacillin-tazobactam (ZOSYN) IVPB 3.375 g     3.375 g 12.5 mL/hr over 240 Minutes Intravenous  Once 11/05/15 1348 11/05/15 1747   11/05/15 1400  vancomycin (VANCOCIN) IVPB 1000 mg/200 mL premix     1,000 mg 200 mL/hr over 60 Minutes Intravenous  Once 11/05/15 1348 11/05/15 1723      HPI/Subjective: Feels better. Eager to go home  Objective: Filed Vitals:   11/08/15 0600 11/08/15 0700 11/08/15 0800 11/08/15 1200  BP: 133/59  123/73   Pulse:      Temp:   97.6 F (36.4 C) 97.4 F (36.3 C)  TempSrc:   Oral Oral  Resp: Height:      Weight:      SpO2:        Intake/Output Summary (Last 24 hours) at 11/08/15 1359 Last data filed at 11/08/15 1300  Gross per 24 hour  Intake   3150 ml  Output   2100 ml  Net   1050 ml   Filed Weights   11/05/15 1244 11/05/15 1825  Weight: 77.111 kg (170 lb) 84.8 kg (186 lb 15.2 oz)    Exam:   General:  Awake, in nad, laying in bed  Cardiovascular: regular, s1, s2  Respiratory: normal resp effort, no wheezing  Abdomen: soft,nondistended, nontender  Musculoskeletal: perfused, no cyanosis  Data Reviewed: Basic Metabolic Panel:  Recent Labs Lab 11/05/15 1258 11/06/15 0330  11/07/15 0334 11/08/15 0436  NA 144 144 145 139  K 4.5 4.3 4.1 3.7  CL 106 114* 114* 109  CO2 17* 21* 23 22  GLUCOSE 130* 98 121* 117*  BUN 28* 25* 18 13  CREATININE 2.38* 1.49* 1.02 0.84  CALCIUM 9.2 7.6* 8.5* 8.2*   Liver Function Tests:  Recent Labs Lab 11/05/15 1258 11/06/15 0330  AST 49* 26  ALT 28 22  ALKPHOS 75 64  BILITOT 0.6 0.8  PROT 6.0* 5.1*  ALBUMIN 2.9* 2.5*   No results for input(s): LIPASE, AMYLASE in the last 168 hours. No results for input(s): AMMONIA in the last 168 hours. CBC:  Recent Labs Lab 11/05/15 1258 11/06/15 0330 11/07/15 0334 11/08/15 0436  WBC 28.8* 19.7* 12.1* 9.6  NEUTROABS 25.6*  --   --   --   HGB 15.0 12.2* 12.5* 12.0*  HCT 44.3 37.9* 39.9 37.4*  MCV 87.0 88.6 90.7 89.5  PLT 247 163 159 157   Cardiac Enzymes: No results for input(s): CKTOTAL, CKMB, CKMBINDEX, TROPONINI in the last 168 hours. BNP (last 3 results)  Recent Labs  01/17/15 0149  BNP 577.8*    ProBNP (last 3 results) No results for input(s): PROBNP in the last 8760 hours.  CBG: No results for input(s): GLUCAP in the last 168 hours.  Recent Results (from the past 240 hour(s))  Culture, blood (routine x 2)     Status: None (Preliminary result)   Collection Time: 11/05/15  1:05 PM  Result Value Ref Range Status   Specimen Description BLOOD LEFT HAND  Final   Special Requests IN PEDIATRIC BOTTLE  Final   Culture  Setup Time   Final    GRAM NEGATIVE RODS IN PEDIATRIC BOTTLE CRITICAL RESULT CALLED TO, READ BACK BY AND VERIFIED WITH: M [facility]  11/06/15 MKELLY    Culture   Final    GRAM NEGATIVE RODS IDENTIFICATION TO FOLLOW Performed at [facility]    Report Status PENDING  Incomplete  Culture, blood (routine x 2)     Status: None (Preliminary result)   Collection Time: 11/05/15  1:08 PM  Result Value Ref Range Status   Specimen Description BLOOD RIGHT HAND  Final   Special Requests   Final    BOTTLES DRAWN AEROBIC AND ANAEROBIC  BLUE 5CC RED 4CC   Culture  Setup Time   Final    GRAM NEGATIVE RODS IN BOTH AEROBIC AND ANAEROBIC BOTTLES CRITICAL RESULT CALLED TO, READ BACK BY AND VERIFIED WITH: Ervin Knack AT 0812 ON [id number] BY Lucienne Capers    Culture   Final    KLEBSIELLA PNEUMONIAE Performed at [facility]    Report Status PENDING  Incomplete   Organism ID, Bacteria KLEBSIELLA PNEUMONIAE  Final      Susceptibility   Klebsiella pneumoniae - MIC*    AMPICILLIN >=32 RESISTANT Resistant     CEFAZOLIN <=4 SENSITIVE Sensitive     CEFEPIME <=1 SENSITIVE Sensitive     CEFTAZIDIME <=1 SENSITIVE Sensitive  CEFTRIAXONE <=1 SENSITIVE Sensitive     CIPROFLOXACIN <=0.25 SENSITIVE Sensitive     GENTAMICIN <=1 SENSITIVE Sensitive     IMIPENEM <=0.25 SENSITIVE Sensitive     TRIMETH/SULFA <=20 SENSITIVE Sensitive     AMPICILLIN/SULBACTAM 4 SENSITIVE Sensitive     PIP/TAZO <=4 SENSITIVE Sensitive     * KLEBSIELLA PNEUMONIAE  MRSA PCR Screening     Status: None   Collection Time: 11/05/15  6:27 PM  Result Value Ref Range Status   MRSA by PCR NEGATIVE NEGATIVE Final    Comment:        The GeneXpert MRSA Assay (FDA approved for NASAL specimens only), is one component of a comprehensive MRSA colonization surveillance program. It is not intended to diagnose MRSA infection nor to guide or monitor treatment for MRSA infections.   Urine culture     Status: None   Collection Time: 11/06/15 10:35 PM  Result Value Ref Range Status   Specimen Description URINE, RANDOM  Final   Special Requests NONE  Final   Culture   Final    4,000 COLONIES/mL INSIGNIFICANT GROWTH Performed at [facility]    Report Status 11/08/2015 FINAL  Final     Studies: No results found.  Scheduled Meds: . apixaban  2.5 mg Oral BID  . bisacodyl  10 mg Rectal Daily  . feeding supplement (ENSURE ENLIVE)  237 mL Oral BID BM  . metoprolol succinate  50 mg Oral q morning - 10a  . mirabegron ER  25 mg Oral Daily  . mirtazapine   7.5 mg Oral QHS  . predniSONE  10 mg Oral Q breakfast  . pyridostigmine  30 mg Oral TID  . simvastatin  10 mg Oral Daily  . sodium chloride  3 mL Intravenous Q12H   Continuous Infusions: . sodium chloride 125 mL/hr at 11/08/15 0615    Principal Problem:   Septic shock (HCC) Active Problems:   Atrial fibrillation (HCC)   Acute encephalopathy   Acute renal failure superimposed on stage 3 chronic kidney disease (HCC)   Myasthenia gravis (HCC)   Hypotension   FTT (failure to thrive) in adult   Neurogenic bladder   Frequent falls   CHIU, STEPHEN K  [facility] Pager [phone]. If 7PM-7AM, please contact night-coverage at [email or web address], password [facility] 11/08/2015, 1:59 PM  LOS: 3 days

## 2015-11-09 DIAGNOSIS — R627 Adult failure to thrive: Secondary | ICD-10-CM | POA: Diagnosis not present

## 2015-11-09 DIAGNOSIS — M6281 Muscle weakness (generalized): Secondary | ICD-10-CM | POA: Diagnosis not present

## 2015-11-09 DIAGNOSIS — A414 Sepsis due to anaerobes: Secondary | ICD-10-CM | POA: Diagnosis not present

## 2015-11-09 DIAGNOSIS — R2681 Unsteadiness on feet: Secondary | ICD-10-CM | POA: Diagnosis not present

## 2015-11-09 DIAGNOSIS — A419 Sepsis, unspecified organism: Secondary | ICD-10-CM | POA: Diagnosis not present

## 2015-11-09 DIAGNOSIS — F4320 Adjustment disorder, unspecified: Secondary | ICD-10-CM | POA: Diagnosis not present

## 2015-11-09 DIAGNOSIS — L988 Other specified disorders of the skin and subcutaneous tissue: Secondary | ICD-10-CM | POA: Diagnosis not present

## 2015-11-09 DIAGNOSIS — I480 Paroxysmal atrial fibrillation: Secondary | ICD-10-CM | POA: Diagnosis not present

## 2015-11-09 DIAGNOSIS — R6521 Severe sepsis with septic shock: Secondary | ICD-10-CM | POA: Diagnosis not present

## 2015-11-09 DIAGNOSIS — Z452 Encounter for adjustment and management of vascular access device: Secondary | ICD-10-CM | POA: Diagnosis not present

## 2015-11-09 DIAGNOSIS — Z5181 Encounter for therapeutic drug level monitoring: Secondary | ICD-10-CM | POA: Diagnosis not present

## 2015-11-09 DIAGNOSIS — R531 Weakness: Secondary | ICD-10-CM | POA: Diagnosis not present

## 2015-11-09 DIAGNOSIS — Z7189 Other specified counseling: Secondary | ICD-10-CM | POA: Diagnosis not present

## 2015-11-09 DIAGNOSIS — N183 Chronic kidney disease, stage 3 (moderate): Secondary | ICD-10-CM | POA: Diagnosis not present

## 2015-11-09 DIAGNOSIS — Z466 Encounter for fitting and adjustment of urinary device: Secondary | ICD-10-CM | POA: Diagnosis not present

## 2015-11-09 DIAGNOSIS — G7000 Myasthenia gravis without (acute) exacerbation: Secondary | ICD-10-CM | POA: Diagnosis not present

## 2015-11-09 DIAGNOSIS — N179 Acute kidney failure, unspecified: Secondary | ICD-10-CM | POA: Diagnosis not present

## 2015-11-09 DIAGNOSIS — R262 Difficulty in walking, not elsewhere classified: Secondary | ICD-10-CM | POA: Diagnosis not present

## 2015-11-09 DIAGNOSIS — E785 Hyperlipidemia, unspecified: Secondary | ICD-10-CM | POA: Diagnosis not present

## 2015-11-09 DIAGNOSIS — B961 Klebsiella pneumoniae [K. pneumoniae] as the cause of diseases classified elsewhere: Secondary | ICD-10-CM | POA: Diagnosis not present

## 2015-11-09 DIAGNOSIS — N319 Neuromuscular dysfunction of bladder, unspecified: Secondary | ICD-10-CM | POA: Diagnosis not present

## 2015-11-09 DIAGNOSIS — Z792 Long term (current) use of antibiotics: Secondary | ICD-10-CM | POA: Diagnosis not present

## 2015-11-09 DIAGNOSIS — N401 Enlarged prostate with lower urinary tract symptoms: Secondary | ICD-10-CM | POA: Diagnosis not present

## 2015-11-09 DIAGNOSIS — R338 Other retention of urine: Secondary | ICD-10-CM | POA: Diagnosis not present

## 2015-11-09 DIAGNOSIS — R296 Repeated falls: Secondary | ICD-10-CM | POA: Diagnosis not present

## 2015-11-09 DIAGNOSIS — G9340 Encephalopathy, unspecified: Secondary | ICD-10-CM | POA: Diagnosis not present

## 2015-11-09 LAB — BASIC METABOLIC PANEL
Anion gap: 7 (ref 5–15)
BUN: 11 mg/dL (ref 6–20)
CHLORIDE: 113 mmol/L — AB (ref 101–111)
CO2: 24 mmol/L (ref 22–32)
CREATININE: 0.76 mg/dL (ref 0.61–1.24)
Calcium: 8.4 mg/dL — ABNORMAL LOW (ref 8.9–10.3)
GFR calc non Af Amer: >60 mL/min (ref >60)
Glucose, Bld: 106 mg/dL — ABNORMAL HIGH (ref 65–99)
Potassium: 3.7 mmol/L (ref 3.5–5.1)
SODIUM: 144 mmol/L (ref 135–145)

## 2015-11-09 LAB — CULTURE, BLOOD (ROUTINE X 2)

## 2015-11-09 LAB — CBC
HCT: 37.1 % — ABNORMAL LOW (ref 39.0–52.0)
Hemoglobin: 11.9 g/dL — ABNORMAL LOW (ref 13.0–17.0)
MCH: 28.1 pg (ref 26.0–34.0)
MCHC: 32.1 g/dL (ref 30.0–36.0)
MCV: 87.5 fL (ref 78.0–100.0)
PLATELETS: 159 10*3/uL (ref 150–400)
RBC: 4.24 MIL/uL (ref 4.22–5.81)
RDW: 16.5 % — ABNORMAL HIGH (ref 11.5–15.5)
WBC: 7.9 10*3/uL (ref 4.0–10.5)

## 2015-11-09 MED ORDER — DEXTROSE 5 % IV SOLN: 2.0000 g | INTRAVENOUS | Status: DC

## 2015-11-09 MED ORDER — HEPARIN SOD (PORK) LOCK FLUSH 100 UNIT/ML IV SOLN
250.0000 [IU] | INTRAVENOUS | Status: AC | PRN
  Administered 2015-11-09: 250 [IU]

## 2015-11-09 NOTE — Progress Notes (Signed)
Patient is set to discharge back to [facility] today. Patient & family at bedside aware. Discharge packet given to RN, Tess.   [facility] will be called for transport once IV Team flushes PICC line.     Lincoln Maxin, LCSW [facility] Clinical Social Worker cell #: [phone]

## 2015-11-09 NOTE — [place] FL2 (Signed)
[place] MEDICAID FL2 LEVEL OF CARE SCREENING TOOL     IDENTIFICATION  Patient Name: Seth [name] Birthdate: 07-08-1925 Sex: male Admission Date (Current Location): 11/05/2015  [facility] and [place] Number:  Producer, television/film/video and Address:  [facility],  [address] [place], [place] [address]      Provider Number: [id number]  Attending Physician Name and Address:  Jerald Kief, MD  Relative Name and Phone Number:       Current Level of Care: Hospital Recommended Level of Care: Skilled Nursing Facility Prior Approval Number:    Date Approved/Denied:   PASRR Number: [id number]  Discharge Plan: SNF    Current Diagnoses: Patient Active Problem List   Diagnosis Date Noted  . Frequent falls 11/05/2015  . Candida UTI 10/01/2015  . FTT (failure to thrive) in adult 09/25/2015  . Neurogenic bladder 09/25/2015  . Inflammatory arthritis (HCC) 08/21/2015  . 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 Bacteremia due to Klebsiella pneumoniae 08/07/2015  . Sepsis due to Klebsiella pneumoniae (HCC) 08/07/2015  . DNR (do not resuscitate) 08/06/2015  . Septic shock (HCC) 08/03/2015  . Hypotension 08/03/2015  . Leukocytosis 08/03/2015  . Catheter-associated urinary tract infection (HCC) 08/03/2015  . Palliative care encounter   . Acute renal failure superimposed on stage 3 chronic kidney disease (HCC) 07/13/2015  . Myasthenia gravis (HCC) 07/13/2015  . Acute encephalopathy 07/04/2015  . Fecal impaction (HCC) 07/04/2015  . Dementia 07/04/2015  . Pressure ulcer 07/04/2015  . AP (abdominal pain)   . Hypoxia   . Altered mental status   . Consciousness loss of 05/29/2015  . Vertigo, central origin 05/29/2015  . Dizziness and giddiness 05/29/2015  . Weakness 05/29/2015  . Cerebral brain hemorrhage (HCC) 05/29/2015  . TIA (transient ischemic attack) 05/29/2015  . Proximal limb muscle weakness 05/29/2015  . Ataxia 05/29/2015  . Syncope 03/31/2015  . CKD (chronic kidney  disease), stage III 03/31/2015  . HTN (hypertension) 03/31/2015  . Atrial fibrillation (HCC) 03/31/2015  . Dyslipidemia 03/31/2015    Orientation RESPIRATION BLADDER Height & Weight    Self, Place  Normal Continent  (180.3 cm) 186 lbs.  BEHAVIORAL SYMPTOMS/MOOD NEUROLOGICAL BOWEL NUTRITION STATUS     (none) Incontinent Diet (heart healthy)  AMBULATORY STATUS COMMUNICATION OF NEEDS Skin   Total Care Verbally Normal                       Personal Care Assistance Level of Assistance  Bathing, Feeding, Dressing Bathing Assistance: Maximum assistance Feeding assistance: Limited assistance Dressing Assistance: Maximum assistance Total Care Assistance: Maximum assistance   Functional Limitations Info             SPECIAL CARE FACTORS FREQUENCY                       Contractures      Additional Factors Info  Code Status, Allergies, Isolation Precautions Code Status Info: DNR code status Allergies Info: Ceftin     Isolation Precautions Info: Droplet Precautions     Current Medications (11/09/2015):  This is the current hospital active medication list Current Facility-Administered Medications  Medication Dose Route Frequency Provider Last Rate Last Dose  . 0.9 %  sodium chloride infusion   Intravenous Continuous Nishant Dhungel, MD 125 mL/hr at 11/09/15 0439    . acetaminophen (TYLENOL) tablet 650 mg  650 mg Oral Q6H PRN Nishant Dhungel, MD       Or  . acetaminophen (  TYLENOL) suppository 650 mg  650 mg Rectal Q6H PRN Nishant Dhungel, MD   650 mg at 11/05/15 2303  . apixaban (ELIQUIS) tablet 2.5 mg  2.5 mg Oral BID Jerald Kief, MD   2.5 mg at 11/09/15 1029  . bisacodyl (DULCOLAX) suppository 10 mg  10 mg Rectal Daily Nishant Dhungel, MD   10 mg at 11/09/15 1054  . cefTRIAXone (ROCEPHIN) 2 g in dextrose 5 % 50 mL IVPB  2 g Intravenous Q24H Christine E Shade, RPH   2 g at 11/08/15 1515  . feeding supplement (ENSURE ENLIVE) (ENSURE ENLIVE) liquid 237 mL   237 mL Oral BID BM Jerald Kief, MD   237 mL at 11/08/15 1515  . labetalol (NORMODYNE,TRANDATE) injection 5 mg  5 mg Intravenous Q10 min PRN Jerald Kief, MD      . metoprolol succinate (TOPROL-XL) 24 hr tablet 50 mg  50 mg Oral q morning - 10a Jerald Kief, MD   50 mg at 11/09/15 1030  . mirabegron ER (MYRBETRIQ) tablet 25 mg  25 mg Oral Daily Jerald Kief, MD   25 mg at 11/09/15 1030  . mirtazapine (REMERON) tablet 7.5 mg  7.5 mg Oral QHS Jerald Kief, MD   7.5 mg at 11/09/15 0014  . ondansetron (ZOFRAN) tablet 4 mg  4 mg Oral Q6H PRN Nishant Dhungel, MD       Or  . ondansetron (ZOFRAN) injection 4 mg  4 mg Intravenous Q6H PRN Nishant Dhungel, MD      . polyethylene glycol (MIRALAX / GLYCOLAX) packet 17 g  17 g Oral Daily PRN Jerald Kief, MD      . predniSONE (DELTASONE) tablet 10 mg  10 mg Oral Q breakfast Jerald Kief, MD   10 mg at 11/09/15 0805  . pyridostigmine (MESTINON) tablet 30 mg  30 mg Oral TID Jerald Kief, MD   30 mg at 11/09/15 1031  . simvastatin (ZOCOR) tablet 10 mg  10 mg Oral Daily Jerald Kief, MD   10 mg at 11/09/15 1030  . sodium chloride 0.9 % injection 10-40 mL  10-40 mL Intracatheter Q12H Jerald Kief, MD   10 mL at 11/09/15 1027  . sodium chloride 0.9 % injection 10-40 mL  10-40 mL Intracatheter PRN Jerald Kief, MD   10 mL at 11/09/15 0715  . sodium chloride 0.9 % injection 3 mL  3 mL Intravenous Q12H Nishant Dhungel, MD   3 mL at 11/09/15 0014  . traMADol (ULTRAM) tablet 50 mg  50 mg Oral Q6H PRN Jerald Kief, MD         Discharge Medications: Please see discharge summary for a list of discharge medications.  Relevant Imaging Results:  Relevant Lab Results:   Additional Information SSN: [id number]  Arlyss Repress, LCSW

## 2015-11-09 NOTE — Discharge Summary (Signed)
Physician Discharge Summary  Seth [name] ZOX:[id number] DOB: 1925/07/28 DOA: 11/05/2015  PCP:  Duane Lope, MD  Admit date: 11/05/2015 Discharge date: 11/09/2015  Time spent: 20 minutes  Recommendations for Outpatient Follow-up:  1. Follow up with PCP in 1-2 weeks  2. Stop date for rocephin is through 1/26. Please remove PICC after finishing antibiotic. Thanks  Discharge Diagnoses:  Principal Problem:   Septic shock (HCC) Active Problems:   Atrial fibrillation (HCC)   Acute encephalopathy   Acute renal failure superimposed on stage 3 chronic kidney disease (HCC)   Myasthenia gravis (HCC)   Hypotension   FTT (failure to thrive) in adult   Neurogenic bladder   Frequent falls   Discharge Condition: Improved  Diet recommendation: Heart healthy  Filed Weights   11/05/15 1244 11/05/15 1825  Weight: 77.111 kg (170 lb) 84.8 kg (186 lb 15.2 oz)    History of present illness:  Please review dictated H and P from 1/17 for details. Briefly, 82 year old male with history of hypertension, hyperlipidemia, A. fib on anticoagulation, history of TIAs, multiple falls, neurogenic bladder with suprapubic catheter, mild-to-moderate dementia who was sent from skilled nursing facility after being increasingly confused, found to be floridly septic with UTI.  Hospital Course:   Klebsiella UTI vs Klebsiella bacteremia vs PNA with Septic shock present on admission [facility]) related to suprapubic catheter -Pt presented with leukocytosis, elevated lactate, confusion -CT abd with findings also suggestive of B basilar consolidation -2/2 blood cultures pos for klebsiella, urine cx with insignificant growth, HOWEVER, urine cx was obtained AFTER broad spectrum abx were started -Suprapubic catheter was changed one day before admission at facility. -Patient was initially continued on empiric vanc and zosyn -WBC normalized (was near 30k on admit). Afebrile. Encephalopathy resolved -discussed case with ID who  recommended continued IV rocephin for total of 10 days of abx (stop date through 1/26)   Atrial fibrillation (HCC) -Recently in RVR, resolved with resumption of beta blocker -Add PRN IV labetalol -continue anticoagulation   Acute encephalopathy - Secondary to septic shock.  - Improved   Acute renal failure superimposed on stage 3 chronic kidney disease (HCC) - Improved with IVF hydratinon - Suspect secondary to dehydration vs severe sepsis   Myasthenia gravis (HCC) - Held home medications on admit - Follows with outpatient neurology.  Failure to thrive and frequent falls - Patient uses a walker with 2 person assist at the facility.  - Has had falls at thefacility recently without sustaining any injury.  Procedures - PICC placed 1/20  Consultations:  Discussed case with ID  Discharge Exam: Filed Vitals:   11/08/15 1857 11/08/15 2211 11/09/15 0735 11/09/15 0800  BP: 116/60 127/48 115/50 117/66  Pulse: 66 114 61 63  Temp: 97.6 F (36.4 C) 97.8 F (36.6 C) 97.8 F (36.6 C) 96.7 F (35.9 C)  TempSrc: Oral Axillary Axillary Axillary  Resp: 20 20 18 18   Height:      Weight:      SpO2: 94% 92% 92% 96%    General: awake, in nad Cardiovascular: regular, s1, s2 Respiratory: normal resp effort, no wheezing  Discharge Instructions     Medication List    TAKE these medications        acetaminophen 500 MG tablet  Commonly known as:  TYLENOL  Take 500 mg by mouth every 6 (six) hours as needed for mild pain.     cefTRIAXone 2 g in dextrose 5 % 50 mL  Inject 2 g into the vein daily.  docusate sodium 100 MG capsule  Commonly known as:  COLACE  Take 1 capsule (100 mg total) by mouth 2 (two) times daily.     ELIQUIS 2.5 MG Tabs tablet  Generic drug:  apixaban  Take 2.5 mg by mouth 2 (two) times daily.     feeding supplement (ENSURE ENLIVE) Liqd  Take 237 mLs by mouth 2 (two) times daily between meals.     metoprolol succinate 50 MG 24 hr tablet   Commonly known as:  TOPROL-XL  Take 50 mg by mouth every morning. Take with or immediately following a meal.     mirtazapine 7.5 MG tablet  Commonly known as:  REMERON  Take 7.5 mg by mouth at bedtime.     multivitamin with minerals Tabs tablet  Take 1 tablet by mouth daily.     MYRBETRIQ 25 MG Tb24 tablet  Generic drug:  mirabegron ER  Take 25 mg by mouth daily.     OVER THE COUNTER MEDICATION  Take 1 Dose by mouth at bedtime. Offer "magic cup" as bedtime snack related to weight loss     polyethylene glycol packet  Commonly known as:  MIRALAX / GLYCOLAX  Take 17 g by mouth daily as needed for moderate constipation.     predniSONE 10 MG tablet  Commonly known as:  DELTASONE  Take 1 tablet (10 mg total) by mouth daily with breakfast.     pyridostigmine 60 MG tablet  Commonly known as:  MESTINON  Take 1 tablet (60 mg total) by mouth 3 (three) times daily.     senna 8.6 MG Tabs tablet  Commonly known as:  SENOKOT  Take 1 tablet by mouth 2 (two) times daily.     simvastatin 10 MG tablet  Commonly known as:  ZOCOR  Take 10 mg by mouth daily.     traMADol 50 MG tablet  Commonly known as:  ULTRAM  Take 1 tablet (50 mg total) by mouth every 6 (six) hours as needed for moderate pain.     Vitamin D3 2000 units Tabs  Take 2,000 Units by mouth daily.       Allergies  Allergen Reactions  . Ceftin [Cefuroxime] Diarrhea    Severe diarrhea   Follow-up Information    Follow up with  Duane Lope, MD In 1 week.   Specialty:  Family Medicine   Why:  Hospital follow up   Contact information:   [address] [place] [address] [phone]        The results of significant diagnostics from this hospitalization (including imaging, microbiology, ancillary and laboratory) are listed below for reference.    Significant Diagnostic Studies: Ct Abdomen Pelvis Wo Contrast  11/05/2015  CLINICAL DATA:  Altered mental status with abdominal tenderness EXAM: CT ABDOMEN AND  PELVIS WITHOUT CONTRAST TECHNIQUE: Multidetector CT imaging of the abdomen and pelvis was performed following the standard protocol without IV contrast. COMPARISON:  August 03, 2015 FINDINGS: Lower chest: There are small pleural effusions bilaterally with patchy consolidation in both lung bases, more on the left than on the right. There are multiple foci of coronary artery calcification. Hepatobiliary: There is a 1 x 1 cm probable cyst in the posterior segment of the right lobe of the liver, stable. No other focal liver lesions are identified on this noncontrast enhanced study. There is cholelithiasis. The gallbladder wall does not appear thickened. There is no biliary duct dilatation. Pancreas: There is no pancreatic mass or inflammatory focus. Spleen: No splenic  lesions are identified. Adrenals/Urinary Tract: Adrenals appear unremarkable bilaterally. Kidneys bilaterally contain moderate renal sinus fat bilaterally. There is no renal mass or hydronephrosis on either side. There is no renal or ureteral calculus on either side. The urinary bladder is midline with wall thickness within normal limits. A suprapubic catheter passes through the bladder with the balloon expanded in the region of the urethra, distal to the bladder neck. A small amount of air within the bladder is likely of iatrogenic etiology. Stomach/Bowel: Rectum is mildly distended with stool. There is slight wall thickening of the rectum with mild perirectal stranding, findings that may be due to chronic distention with stool. There are multiple sigmoid diverticula without diverticulitis. Scattered diverticula are noted elsewhere in the colon without diverticulitis. There is no bowel wall or mesenteric thickening. No bowel obstruction. No free air or portal venous air. No bowel pneumatosis. Vascular/Lymphatic: There is atherosclerotic calcification in the aorta. No aneurysm. There is moderate calcification at the origins of the major mesenteric  vessels. There is no demonstrable adenopathy in the abdomen or pelvis. Reproductive: Prostate is mildly enlarged. The seminal vesicles appear normal. No pelvic mass or fluid collection. Other: Appendix region appears normal. No abscess or ascites in the abdomen or pelvis. Musculoskeletal: There is extensive mixed sclerotic and lytic change throughout the sacral. There is coarse cortical thickening in the right iliac crest. No new bone lesions are identified. There is periarticular osteoporosis in the hip regions. There is no intramuscular or abdominal wall lesion. IMPRESSION: Suprapubic catheter tip is in the urethra, inferior to the bladder neck. A small amount of air in the bladder is likely of iatrogenic etiology. Prostate remains enlarged. The previously noted air and inflammation in the region of the penis and perineum have resolved. There is mild dilatation of the wall of the rectum with mild rectal wall thickening and mild perirectal stranding, likely due to chronic inflammation due to chronic rectal distention from stool. There is bibasilar airspace consolidation in the lungs, more on the left than on the right. Small pleural effusions bilaterally. Cholelithiasis.  Gallbladder wall not thickened. Multiple colonic diverticula without diverticulitis. No bowel obstruction. No abscess. Appendix region appears unremarkable. No renal or ureteral calculi. No hydronephrosis. Stable bony changes in the pelvis and sacrum. 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 Electronically Signed   By: Bretta Bang III M.D.   On: 11/05/2015 17:56   Dg Chest 2 View  11/05/2015  CLINICAL DATA:  Fever, altered mental status EXAM: CHEST  2 VIEW COMPARISON:  08/02/2015 FINDINGS: Elevated left diaphragm. No pneumothorax or pleural effusion. Bilateral mild interstitial thickening. No focal consolidation. Stable cardiomegaly. No acute osseous abnormality. IMPRESSION: Stable cardiomegaly with pulmonary vascular congestion. Electronically Signed    By: Elige Ko   On: 11/05/2015 14:34   Dg Chest Port 1 View  11/08/2015  CLINICAL DATA:  Status post line placement. EXAM: PORTABLE CHEST 1 VIEW COMPARISON:  11/05/2015 FINDINGS: There has been a right PICC line placement. Tip overlies the expected location of cavoatrial junction. The cardiac silhouette is enlarged. Mediastinal contours appear intact. There is left retrocardiac airspace consolidation and/or left pleural effusion. There is no evidence of pneumothorax. Osseous structures are without acute abnormality. Soft tissues are grossly normal. IMPRESSION: Status post right PICC line placement.  No evidence of pneumothorax. Left retrocardiac airspace consolidation and/or left pleural effusion. Electronically Signed   By: Ted Mcalpine M.D.   On: 11/08/2015 18:14    Microbiology: Recent Results (from the past 240 hour(s))  Culture, blood (routine  x 2)     Status: None   Collection Time: 11/05/15  1:05 PM  Result Value Ref Range Status   Specimen Description BLOOD LEFT HAND  Final   Special Requests IN PEDIATRIC BOTTLE  Final   Culture  Setup Time   Final    GRAM NEGATIVE RODS IN PEDIATRIC BOTTLE CRITICAL RESULT CALLED TO, READ BACK BY AND VERIFIED WITH: M [facility]  11/06/15 MKELLY    Culture   Final    KLEBSIELLA PNEUMONIAE SUSCEPTIBILITIES PERFORMED ON PREVIOUS CULTURE WITHIN THE LAST 5 DAYS. Performed at [facility]    Report Status 11/09/2015 FINAL  Final  Culture, blood (routine x 2)     Status: None   Collection Time: 11/05/15  1:08 PM  Result Value Ref Range Status   Specimen Description BLOOD RIGHT HAND  Final   Special Requests   Final    BOTTLES DRAWN AEROBIC AND ANAEROBIC BLUE 5CC RED 4CC   Culture  Setup Time   Final    GRAM NEGATIVE RODS IN BOTH AEROBIC AND ANAEROBIC BOTTLES CRITICAL RESULT CALLED TO, READ BACK BY AND VERIFIED WITH: Ervin Knack AT 0812 ON [id number] BY Lucienne Capers    Culture   Final    KLEBSIELLA PNEUMONIAE Performed at  [facility]    Report Status 11/09/2015 FINAL  Final   Organism ID, Bacteria KLEBSIELLA PNEUMONIAE  Final      Susceptibility   Klebsiella pneumoniae - MIC*    AMPICILLIN >=32 RESISTANT Resistant     CEFAZOLIN <=4 SENSITIVE Sensitive     CEFEPIME <=1 SENSITIVE Sensitive     CEFTAZIDIME <=1 SENSITIVE Sensitive     CEFTRIAXONE <=1 SENSITIVE Sensitive     CIPROFLOXACIN <=0.25 SENSITIVE Sensitive     GENTAMICIN <=1 SENSITIVE Sensitive     IMIPENEM <=0.25 SENSITIVE Sensitive     TRIMETH/SULFA <=20 SENSITIVE Sensitive     AMPICILLIN/SULBACTAM 4 SENSITIVE Sensitive     PIP/TAZO <=4 SENSITIVE Sensitive     * KLEBSIELLA PNEUMONIAE  MRSA PCR Screening     Status: None   Collection Time: 11/05/15  6:27 PM  Result Value Ref Range Status   MRSA by PCR NEGATIVE NEGATIVE Final    Comment:        The GeneXpert MRSA Assay (FDA approved for NASAL specimens only), is one component of a comprehensive MRSA colonization surveillance program. It is not intended to diagnose MRSA infection nor to guide or monitor treatment for MRSA infections.   Urine culture     Status: None   Collection Time: 11/06/15 10:35 PM  Result Value Ref Range Status   Specimen Description URINE, RANDOM  Final   Special Requests NONE  Final   Culture   Final    4,000 COLONIES/mL INSIGNIFICANT GROWTH Performed at [facility]    Report Status 11/08/2015 FINAL  Final     Labs: Basic Metabolic Panel:  Recent Labs Lab 11/05/15 1258 11/06/15 0330 11/07/15 0334 11/08/15 0436 11/09/15 0710  NA 144 144 145 139 144  K 4.5 4.3 4.1 3.7 3.7  CL 106 114* 114* 109 113*  CO2 17* 21* GLUCOSE 130* 98 121* 117* 106*  BUN 28* 25* CREATININE 2.38* 1.49* 1.02 0.84 0.76  CALCIUM 9.2 7.6* 8.5* 8.2* 8.4*   Liver Function Tests:  Recent Labs Lab 11/05/15 1258 11/06/15 0330  AST 49* 26  ALT 28 22  ALKPHOS 75 64  BILITOT 0.6 0.8  PROT  6.0* 5.1*  ALBUMIN 2.9* 2.5*   No results for  input(s): LIPASE, AMYLASE in the last 168 hours. No results for input(s): AMMONIA in the last 168 hours. CBC:  Recent Labs Lab 11/05/15 1258 11/06/15 0330 11/07/15 0334 11/08/15 0436 11/09/15 0710  WBC 28.8* 19.7* 12.1* 9.6 7.9  NEUTROABS 25.6*  --   --   --   --   HGB 15.0 12.2* 12.5* 12.0* 11.9*  HCT 44.3 37.9* 39.9 37.4* 37.1*  MCV 87.0 88.6 90.7 89.5 87.5  PLT 247 163 159 157 159   Cardiac Enzymes: No results for input(s): CKTOTAL, CKMB, CKMBINDEX, TROPONINI in the last 168 hours. BNP: BNP (last 3 results)  Recent Labs  01/17/15 0149  BNP 577.8*    ProBNP (last 3 results) No results for input(s): PROBNP in the last 8760 hours.  CBG: No results for input(s): GLUCAP in the last 168 hours.   Signed:  Treyvonne Tata K  [facility] 11/09/2015, 11:25 AM

## 2015-11-09 NOTE — Progress Notes (Signed)
PT Cancellation Note  Patient Details Name: Seth [name] MRN: [id number] DOB: Jul 22, 1925   Cancelled Treatment:    Reason Eval/Treat Not Completed: PT screened, no needs identified, will sign off. Pt getting prepared to head back to nursing home at this time. Defer needs to next venue./    Ryla Cauthon, [facility] 11/09/2015, 11:54 AM

## 2015-11-09 NOTE — Progress Notes (Signed)
[facility] called for transport.     Tatym Schermer, LCSW [facility] Clinical Social Worker cell #: [phone]  

## 2015-11-11 LAB — INFLUENZA PANEL BY PCR (TYPE A & B)
H1N1 flu by pcr: NOT DETECTED
INFLAPCR: NEGATIVE
Influenza B By PCR: NEGATIVE

## 2015-11-13 ENCOUNTER — Non-Acute Institutional Stay (SKILLED_NURSING_FACILITY): Payer: Medicare Other | Admitting: Internal Medicine

## 2015-11-13 DIAGNOSIS — R6521 Severe sepsis with septic shock: Secondary | ICD-10-CM | POA: Diagnosis not present

## 2015-11-13 DIAGNOSIS — N179 Acute kidney failure, unspecified: Secondary | ICD-10-CM | POA: Diagnosis not present

## 2015-11-13 DIAGNOSIS — G9340 Encephalopathy, unspecified: Secondary | ICD-10-CM | POA: Diagnosis not present

## 2015-11-13 DIAGNOSIS — R627 Adult failure to thrive: Secondary | ICD-10-CM | POA: Diagnosis not present

## 2015-11-13 DIAGNOSIS — N183 Chronic kidney disease, stage 3 (moderate): Secondary | ICD-10-CM

## 2015-11-13 DIAGNOSIS — R296 Repeated falls: Secondary | ICD-10-CM

## 2015-11-13 DIAGNOSIS — N319 Neuromuscular dysfunction of bladder, unspecified: Secondary | ICD-10-CM

## 2015-11-13 DIAGNOSIS — E785 Hyperlipidemia, unspecified: Secondary | ICD-10-CM | POA: Diagnosis not present

## 2015-11-13 DIAGNOSIS — A414 Sepsis due to anaerobes: Secondary | ICD-10-CM | POA: Diagnosis not present

## 2015-11-13 DIAGNOSIS — I480 Paroxysmal atrial fibrillation: Secondary | ICD-10-CM

## 2015-11-13 DIAGNOSIS — G7000 Myasthenia gravis without (acute) exacerbation: Secondary | ICD-10-CM | POA: Diagnosis not present

## 2015-11-13 DIAGNOSIS — A4159 Other Gram-negative sepsis: Secondary | ICD-10-CM

## 2015-11-13 DIAGNOSIS — A419 Sepsis, unspecified organism: Secondary | ICD-10-CM | POA: Diagnosis not present

## 2015-11-15 ENCOUNTER — Encounter: Payer: Self-pay | Admitting: Internal Medicine

## 2015-11-15 NOTE — Progress Notes (Signed)
MRN: [id number] Name: Seth [name]  Sex: male Age: 82 y.o. DOB: 1925/06/15  [facility] #: [facility] Facility/Room:503 Level Of Care: SNF Provider: Merrilee Seashore D Emergency Contacts: Extended Emergency Contact Information Primary Emergency Contact: Dittman,David & Neoma [place]aming [place] Home Phone: [phone] Mobile Phone: [phone] Relation: Son Secondary Emergency Contact: Richardean Chimera [place] Home Phone: [phone] Relation: None  Code Status:   Allergies: Ceftin  Chief Complaint  Patient presents with  . New Admit To SNF    HPI: Patient is 82 y.o. male with history of hypertension, hyperlipidemia, A. fib on anticoagulation, history of TIAs, multiple falls, neurogenic bladder with suprapubic catheter, mild-to-moderate dementia who was sent from skilled nursing facility after being increasingly confused, found to be floridly septic with UTI. Pt was admitted to [facility] from 1/17-21 were he was treated for klebsiella septic shock. Course was complicated by acute renal failure and acute encephalopathy, which are resolved. Pt is admitted back to SNF with generalized weakness for OT/PT and for residential care.While at SNF pt will be followed for myasthenia gravis, tx with mestanon, AF, tx with lopressor and eliquis, and HLD, tx with zocor.   Past Medical History  Diagnosis Date  . Hypertension   . Hyperlipidemia   . Aortic stenosis   . High cholesterol   . A-fib (HCC)   . History of TIAs   . Multiple falls   . Urinary incontinence   . Dementia     short term memory loss    Past Surgical History  Procedure Laterality Date  . Hernia repair      1970s  . Back surgery  2006  . Cervical laminectomy  2002 ?  [name] Insertion of suprapubic catheter N/A 06/26/2015    Procedure: INSERTION OF SUPRAPUBIC CATHETER;  Surgeon: Malen Gauze, MD;  Location: [facility];  Service: Urology;  Laterality: N/A;  . Cystoscopy N/A 06/26/2015    Procedure: CYSTOSCOPY;   Surgeon: Malen Gauze, MD;  Location: [facility];  Service: Urology;  Laterality: N/A;      Medication List       This list is accurate as of: 11/13/15 11:59 PM.  Always use your most recent med list.               acetaminophen 500 MG tablet  Commonly known as:  TYLENOL  Take 500 mg by mouth every 6 (six) hours as needed for mild pain.     cefTRIAXone 2 g in dextrose 5 % 50 mL  Inject 2 g into the vein daily.     docusate sodium 100 MG capsule  Commonly known as:  COLACE  Take 1 capsule (100 mg total) by mouth 2 (two) times daily.     ELIQUIS 2.5 MG Tabs tablet  Generic drug:  apixaban  Take 2.5 mg by mouth 2 (two) times daily.     feeding supplement (ENSURE ENLIVE) Liqd  Take 237 mLs by mouth 2 (two) times daily between meals.     metoprolol succinate 50 MG 24 hr tablet  Commonly known as:  TOPROL-XL  Take 50 mg by mouth every morning. Take with or immediately following a meal.     mirtazapine 7.5 MG tablet  Commonly known as:  REMERON  Take 7.5 mg by mouth at bedtime.     multivitamin with minerals Tabs tablet  Take 1 tablet by mouth daily.     MYRBETRIQ 25 MG Tb24 tablet  Generic drug:  mirabegron ER  Take 25 mg by mouth daily.     OVER THE COUNTER MEDICATION  Take 1 Dose by mouth at bedtime. Offer "magic cup" as bedtime snack related to weight loss     polyethylene glycol packet  Commonly known as:  MIRALAX / GLYCOLAX  Take 17 g by mouth daily as needed for moderate constipation.     predniSONE 10 MG tablet  Commonly known as:  DELTASONE  Take 1 tablet (10 mg total) by mouth daily with breakfast.     pyridostigmine 60 MG tablet  Commonly known as:  MESTINON  Take 1 tablet (60 mg total) by mouth 3 (three) times daily.     senna 8.6 MG Tabs tablet  Commonly known as:  SENOKOT  Take 1 tablet by mouth 2 (two) times daily.     simvastatin 10 MG tablet  Commonly known as:  ZOCOR  Take 10 mg by mouth daily.     traMADol 50 MG tablet  Commonly known  as:  ULTRAM  Take 1 tablet (50 mg total) by mouth every 6 (six) hours as needed for moderate pain.     Vitamin D3 2000 units Tabs  Take 2,000 Units by mouth daily.        No orders of the defined types were placed in this encounter.    Immunization History  Administered Date(s) Administered  . Influenza,inj,Quad PF,36+ Mos 07/05/2015  . Pneumococcal Polysaccharide-23 07/05/2015  . Tdap 03/31/2015    Social History  Substance Use Topics  . Smoking status: Former Games developer  . Smokeless tobacco: Never Used  . Alcohol Use: No    Family history is + stroke, prostate CA   Review of Systems  DATA OBTAINED: from patient, nurse GENERAL:  no fevers, fatigue, appetite changes SKIN: No itching, rash or wounds EYES: No eye pain, redness, discharge EARS: No earache, tinnitus, change in hearing NOSE: No congestion, drainage or bleeding  MOUTH/THROAT: No mouth or tooth pain, No sore throat RESPIRATORY: No cough, wheezing, SOB CARDIAC: No chest pain, palpitations, lower extremity edema  GI: No abdominal pain, No N/V/D or constipation, No heartburn or reflux  GU: No dysuria, frequency or urgency, or incontinence  MUSCULOSKELETAL: No unrelieved bone/joint pain NEUROLOGIC: No headache, dizziness or focal weakness PSYCHIATRIC: No c/o anxiety or sadness   Filed Vitals:   11/15/15 0855  BP: 138/62  Pulse: 81  Temp: 98 F (36.7 C)  Resp: 18    SpO2 Readings from Last 1 Encounters:  11/09/15 96%        Physical Exam  GENERAL APPEARANCE: Alert, conversant,  No acute distress.  SKIN: No diaphoresis rash HEAD: Normocephalic, atraumatic  EYES: Conjunctiva/lids clear. Pupils round, reactive. EOMs intact.  EARS: External exam WNL, canals clear. Hearing grossly normal.  NOSE: No deformity or discharge.  MOUTH/THROAT: Lips w/o lesions  RESPIRATORY: Breathing is even, unlabored. Lung sounds are clear   CARDIOVASCULAR: Heart RRR 3/6 systolic M, no rubs or gallops. 1+ peripheral  edema.   GASTROINTESTINAL: Abdomen is soft, non-tender, not distended w/ normal bowel sounds. GENITOURINARY: Bladder non tender, not distended  MUSCULOSKELETAL: No abnormal joints or musculature NEUROLOGIC:  Cranial nerves 2-12 grossly intact. Moves all extremities  PSYCHIATRIC: Mood and affect appropriate to situation, no behavioral issues  Patient Active Problem List   Diagnosis Date Noted  . Frequent falls 11/05/2015  . Candida UTI 10/01/2015  . FTT (failure to thrive) in adult 09/25/2015  . Neurogenic bladder 09/25/2015  . Inflammatory arthritis (HCC) 08/21/2015  . 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 Bacteremia due to Klebsiella pneumoniae 08/07/2015  . Sepsis due to Klebsiella pneumoniae (HCC) 08/07/2015  . DNR (do not resuscitate) 08/06/2015  . Septic shock (HCC) 08/03/2015  . Hypotension 08/03/2015  . Leukocytosis 08/03/2015  . Catheter-associated urinary tract infection (HCC) 08/03/2015  . Palliative care encounter   . Acute renal failure superimposed on stage 3 chronic kidney disease (HCC) 07/13/2015  . Myasthenia gravis (HCC) 07/13/2015  . Acute encephalopathy 07/04/2015  . Fecal impaction (HCC) 07/04/2015  . Dementia 07/04/2015  . Pressure ulcer 07/04/2015  . AP (abdominal pain)   . Hypoxia   . Altered mental status   . Consciousness loss of 05/29/2015  . Vertigo, central origin 05/29/2015  . Dizziness and giddiness 05/29/2015  . Weakness 05/29/2015  . Cerebral brain hemorrhage (HCC) 05/29/2015  . TIA (transient ischemic attack) 05/29/2015  . Proximal limb muscle weakness 05/29/2015  . Ataxia 05/29/2015  . Syncope 03/31/2015  . CKD (chronic kidney disease), stage III 03/31/2015  . HTN (hypertension) 03/31/2015  . AF (atrial fibrillation) (HCC) 03/31/2015  . Dyslipidemia 03/31/2015    CBC    Component Value Date/Time   WBC 7.9 11/09/2015 0710   RBC 4.24 11/09/2015 0710   HGB 11.9* 11/09/2015 0710   HCT 37.1* 11/09/2015 0710   PLT 159 11/09/2015 0710    MCV 87.5 11/09/2015 0710   LYMPHSABS 0.6* 11/05/2015 1258   MONOABS 2.6* 11/05/2015 1258   EOSABS 0.0 11/05/2015 1258   BASOSABS 0.0 11/05/2015 1258    CMP     Component Value Date/Time   NA 144 11/09/2015 0710   NA 140 05/29/2015 1319   K 3.7 11/09/2015 0710   CL 113* 11/09/2015 0710   CO2 24 11/09/2015 0710   GLUCOSE 106* 11/09/2015 0710   GLUCOSE 152* 05/29/2015 1319   BUN 11 11/09/2015 0710   BUN 22 05/29/2015 1319   CREATININE 0.76 11/09/2015 0710   CALCIUM 8.4* 11/09/2015 0710   PROT 5.1* 11/06/2015 0330   PROT 7.7 05/29/2015 1319   ALBUMIN 2.5* 11/06/2015 0330   ALBUMIN 4.0 05/29/2015 1319   AST 26 11/06/2015 0330   ALT 22 11/06/2015 0330   ALKPHOS 64 11/06/2015 0330   BILITOT 0.8 11/06/2015 0330   BILITOT 1.2 05/29/2015 1319   GFRNONAA >60 11/09/2015 0710   GFRAA >60 11/09/2015 0710    No results found for: HGBA1C   Ct Abdomen Pelvis Wo Contrast  11/05/2015  CLINICAL DATA:  Altered mental status with abdominal tenderness EXAM: CT ABDOMEN AND PELVIS WITHOUT CONTRAST TECHNIQUE: Multidetector CT imaging of the abdomen and pelvis was performed following the standard protocol without IV contrast. COMPARISON:  August 03, 2015 FINDINGS: Lower chest: There are small pleural effusions bilaterally with patchy consolidation in both lung bases, more on the left than on the right. There are multiple foci of coronary artery calcification. Hepatobiliary: There is a 1 x 1 cm probable cyst in the posterior segment of the right lobe of the liver, stable. No other focal liver lesions are identified on this noncontrast enhanced study. There is cholelithiasis. The gallbladder wall does not appear thickened. There is no biliary duct dilatation. Pancreas: There is no pancreatic mass or inflammatory focus. Spleen: No splenic lesions are identified. Adrenals/Urinary Tract: Adrenals appear unremarkable bilaterally. Kidneys bilaterally contain moderate renal sinus fat bilaterally. There is no  renal mass or hydronephrosis on either side. There is no renal or ureteral calculus on either side. The urinary bladder is midline with wall thickness within normal limits.  A suprapubic catheter passes through the bladder with the balloon expanded in the region of the urethra, distal to the bladder neck. A small amount of air within the bladder is likely of iatrogenic etiology. Stomach/Bowel: Rectum is mildly distended with stool. There is slight wall thickening of the rectum with mild perirectal stranding, findings that may be due to chronic distention with stool. There are multiple sigmoid diverticula without diverticulitis. Scattered diverticula are noted elsewhere in the colon without diverticulitis. There is no bowel wall or mesenteric thickening. No bowel obstruction. No free air or portal venous air. No bowel pneumatosis. Vascular/Lymphatic: There is atherosclerotic calcification in the aorta. No aneurysm. There is moderate calcification at the origins of the major mesenteric vessels. There is no demonstrable adenopathy in the abdomen or pelvis. Reproductive: Prostate is mildly enlarged. The seminal vesicles appear normal. No pelvic mass or fluid collection. Other: Appendix region appears normal. No abscess or ascites in the abdomen or pelvis. Musculoskeletal: There is extensive mixed sclerotic and lytic change throughout the sacral. There is coarse cortical thickening in the right iliac crest. No new bone lesions are identified. There is periarticular osteoporosis in the hip regions. There is no intramuscular or abdominal wall lesion. IMPRESSION: Suprapubic catheter tip is in the urethra, inferior to the bladder neck. A small amount of air in the bladder is likely of iatrogenic etiology. Prostate remains enlarged. The previously noted air and inflammation in the region of the penis and perineum have resolved. There is mild dilatation of the wall of the rectum with mild rectal wall thickening and mild  perirectal stranding, likely due to chronic inflammation due to chronic rectal distention from stool. There is bibasilar airspace consolidation in the lungs, more on the left than on the right. Small pleural effusions bilaterally. Cholelithiasis.  Gallbladder wall not thickened. Multiple colonic diverticula without diverticulitis. No bowel obstruction. No abscess. Appendix region appears unremarkable. No renal or ureteral calculi. No hydronephrosis. Stable bony changes in the pelvis and sacrum. 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 Electronically Signed   By: Bretta Bang III M.D.   On: 11/05/2015 17:56   Dg Chest 2 View  11/05/2015  CLINICAL DATA:  Fever, altered mental status EXAM: CHEST  2 VIEW COMPARISON:  08/02/2015 FINDINGS: Elevated left diaphragm. No pneumothorax or pleural effusion. Bilateral mild interstitial thickening. No focal consolidation. Stable cardiomegaly. No acute osseous abnormality. IMPRESSION: Stable cardiomegaly with pulmonary vascular congestion. Electronically Signed   By: Elige Ko   On: 11/05/2015 14:34    Not all labs, radiology exams or other studies done during hospitalization come through on my EPIC note; however they are reviewed by me.    Assessment and Plan  Sepsis due to Klebsiella pneumoniae (HCC) Pt presented with leukocytosis, elevated lactate, confusion -CT abd with findings also suggestive of B basilar consolidation -2/2 blood cultures pos for klebsiella, urine cx with insignificant growth, HOWEVER, urine cx was obtained AFTER broad spectrum abx were started -Suprapubic catheter was changed one day before admission at facility. -Patient was initially continued on empiric vanc and zosyn -WBC normalized (was near 30k on admit). Afebrile. Encephalopathy resolved -discussed case with ID who recommended continued IV rocephin for total of 10 days of abx (stop date through 1/26) SNF - cont IV rocephin throug 1/26   AF (atrial fibrillation) (HCC) Recently in RVR,  resolved with resumption of beta blocker -Add PRN IV labetalol SNF - continue anticoagulation with eliquis and metoprolol 50 mg daily   Septic shock (HCC) 2/2 klebsiella bacteremia tx with aggressive  IV hydration and abx;complicated by encephalopathy, reloved  Acute encephalopathy 2/2 septic shock, resolved  Acute renal failure superimposed on stage 3 chronic kidney disease (HCC) Improved with IVF hydratinon - Suspect secondary to dehydration vs severe sepsis SNF - will follow BMP  Myasthenia gravis (HCC) Held home medications on admit - Follows with outpatient neurology. SNF - mestinon was restarted 60 mg TID  Neurogenic bladder SNF - tx with suprapubic cath and for spasms myrbetric  Frequent falls SNF - currently walker with 2 person assist; pt will be getting OT/PT  FTT (failure to thrive) in adult SNF - pt looked good today, relatively speaking; will monitor and support  Dyslipidemia SNF - stable, LDL 55, HDL 12, pt on zocor 40 mg, may be able to bring down to 20 mg; cont 40 mg for now of zocor   Time spent > 45 min;> 50% of time with patient was spent reviewing records, labs, tests and studies, counseling and developing plan of care  Margit Hanks, MD

## 2015-11-17 ENCOUNTER — Encounter: Payer: Self-pay | Admitting: Internal Medicine

## 2015-11-17 DIAGNOSIS — A4159 Other Gram-negative sepsis: Secondary | ICD-10-CM | POA: Insufficient documentation

## 2015-11-17 DIAGNOSIS — A414 Sepsis due to anaerobes: Secondary | ICD-10-CM | POA: Insufficient documentation

## 2015-11-17 NOTE — Assessment & Plan Note (Signed)
2/2 septic shock, resolved

## 2015-11-17 NOTE — Assessment & Plan Note (Signed)
SNF - tx with suprapubic cath and for spasms myrbetric

## 2015-11-17 NOTE — Assessment & Plan Note (Signed)
SNF - stable, LDL 55, HDL 12, pt on zocor 40 mg, may be able to bring down to 20 mg; cont 40 mg for now of zocor

## 2015-11-17 NOTE — Assessment & Plan Note (Signed)
SNF - pt looked good today, relatively speaking; will monitor and support

## 2015-11-17 NOTE — Assessment & Plan Note (Signed)
Improved with IVF hydratinon - Suspect secondary to dehydration vs severe sepsis SNF - will follow BMP

## 2015-11-17 NOTE — Assessment & Plan Note (Signed)
SNF - currently walker with 2 person assist; pt will be getting OT/PT

## 2015-11-17 NOTE — Assessment & Plan Note (Signed)
Recently in RVR, resolved with resumption of beta blocker -Add PRN IV labetalol SNF - continue anticoagulation with eliquis and metoprolol 50 mg daily

## 2015-11-17 NOTE — Assessment & Plan Note (Signed)
Pt presented with leukocytosis, elevated lactate, confusion -CT abd with findings also suggestive of B basilar consolidation -2/2 blood cultures pos for klebsiella, urine cx with insignificant growth, HOWEVER, urine cx was obtained AFTER broad spectrum abx were started -Suprapubic catheter was changed one day before admission at facility. -Patient was initially continued on empiric vanc and zosyn -WBC normalized (was near 30k on admit). Afebrile. Encephalopathy resolved -discussed case with ID who recommended continued IV rocephin for total of 10 days of abx (stop date through 1/26) SNF - cont IV rocephin throug 1/26

## 2015-11-17 NOTE — Assessment & Plan Note (Signed)
Held home medications on admit - Follows with outpatient neurology. SNF - mestinon was restarted 60 mg TID

## 2015-11-17 NOTE — Assessment & Plan Note (Signed)
2/2 klebsiella bacteremia tx with aggressive IV hydration and abx;complicated by encephalopathy, reloved

## 2015-11-19 DIAGNOSIS — R531 Weakness: Secondary | ICD-10-CM | POA: Diagnosis not present

## 2015-11-27 DIAGNOSIS — N401 Enlarged prostate with lower urinary tract symptoms: Secondary | ICD-10-CM | POA: Diagnosis not present

## 2015-11-27 DIAGNOSIS — R338 Other retention of urine: Secondary | ICD-10-CM | POA: Diagnosis not present

## 2015-12-03 DIAGNOSIS — L988 Other specified disorders of the skin and subcutaneous tissue: Secondary | ICD-10-CM | POA: Diagnosis not present

## 2015-12-05 DIAGNOSIS — F4320 Adjustment disorder, unspecified: Secondary | ICD-10-CM | POA: Diagnosis not present

## 2015-12-09 ENCOUNTER — Encounter: Payer: Self-pay | Admitting: Internal Medicine

## 2015-12-09 ENCOUNTER — Non-Acute Institutional Stay (SKILLED_NURSING_FACILITY): Payer: Medicare Other | Admitting: Internal Medicine

## 2015-12-09 DIAGNOSIS — Z7189 Other specified counseling: Secondary | ICD-10-CM

## 2015-12-09 NOTE — Progress Notes (Signed)
MRN: [id number] Name: Seth [name]  Sex: male Age: 82 y.o. DOB: 1925-01-21  [facility] #: [facility] Facility/Room: Level Of Care: SNF Provider: Merrilee Seashore D Emergency Contacts: Extended Emergency Contact Information Primary Emergency Contact: Vega,David & Neoma Laming [place] Home Phone: [phone] Mobile Phone: [phone] Relation: Son Secondary Emergency Contact: Richardean Chimera [place] Home Phone: [phone] Relation: None  Code Status:   Allergies: Ceftin  Chief Complaint  Patient presents with  . Medical Management of Chronic Issues  . family conference    HPI: Patient is 82 y.o. male who was being seen for a routine visit when several family members asked me to speak to them a little longer about concerns pt had brought to the family.  Past Medical History  Diagnosis Date  . Hypertension   . Hyperlipidemia   . Aortic stenosis   . High cholesterol   . A-fib (HCC)   . History of TIAs   . Multiple falls   . Urinary incontinence   . Dementia     short term memory loss    Past Surgical History  Procedure Laterality Date  . Hernia repair      1970s  . Back surgery  2006  . Cervical laminectomy  2002 ?  [name] Insertion of suprapubic catheter N/A 06/26/2015    Procedure: INSERTION OF SUPRAPUBIC CATHETER;  Surgeon: Malen Gauze, MD;  Location: [facility];  Service: Urology;  Laterality: N/A;  . Cystoscopy N/A 06/26/2015    Procedure: CYSTOSCOPY;  Surgeon: Malen Gauze, MD;  Location: [facility];  Service: Urology;  Laterality: N/A;      Medication List       This list is accurate as of: 12/09/15 11:59 PM.  Always use your most recent med list.               acetaminophen 500 MG tablet  Commonly known as:  TYLENOL  Take 500 mg by mouth every 6 (six) hours as needed for mild pain.     cefTRIAXone 2 g in dextrose 5 % 50 mL  Inject 2 g into the vein daily.     docusate sodium 100 MG capsule  Commonly known as:  COLACE   Take 1 capsule (100 mg total) by mouth 2 (two) times daily.     ELIQUIS 2.5 MG Tabs tablet  Generic drug:  apixaban  Take 2.5 mg by mouth 2 (two) times daily.     feeding supplement (ENSURE ENLIVE) Liqd  Take 237 mLs by mouth 2 (two) times daily between meals.     metoprolol succinate 50 MG 24 hr tablet  Commonly known as:  TOPROL-XL  Take 50 mg by mouth every morning. Take with or immediately following a meal.     mirtazapine 7.5 MG tablet  Commonly known as:  REMERON  Take 7.5 mg by mouth at bedtime.     multivitamin with minerals Tabs tablet  Take 1 tablet by mouth daily.     MYRBETRIQ 25 MG Tb24 tablet  Generic drug:  mirabegron ER  Take 25 mg by mouth daily.     OVER THE COUNTER MEDICATION  Take 1 Dose by mouth at bedtime. Offer "magic cup" as bedtime snack related to weight loss     polyethylene glycol packet  Commonly known as:  MIRALAX / GLYCOLAX  Take 17 g by mouth daily as needed for moderate constipation.     predniSONE 10 MG tablet  Commonly known as:  DELTASONE  Take 1 tablet (10 mg total) by mouth daily with breakfast.     pyridostigmine 60 MG tablet  Commonly known as:  MESTINON  Take 1 tablet (60 mg total) by mouth 3 (three) times daily.     senna 8.6 MG Tabs tablet  Commonly known as:  SENOKOT  Take 1 tablet by mouth 2 (two) times daily.     simvastatin 10 MG tablet  Commonly known as:  ZOCOR  Take 10 mg by mouth daily.     traMADol 50 MG tablet  Commonly known as:  ULTRAM  Take 1 tablet (50 mg total) by mouth every 6 (six) hours as needed for moderate pain.     Vitamin D3 2000 units Tabs  Take 2,000 Units by mouth daily.        No orders of the defined types were placed in this encounter.    Immunization History  Administered Date(s) Administered  . Influenza,inj,Quad PF,36+ Mos 07/05/2015  . Pneumococcal Polysaccharide-23 07/05/2015  . Tdap 03/31/2015    Social History  Substance Use Topics  . Smoking status: Former Games developer  .  Smokeless tobacco: Never Used  . Alcohol Use: No    Review of Systems  DATA OBTAINED: from patient GENERAL:  no fevers, fatigue, appetite changes SKIN: No itching, rash HEENT: No complaint RESPIRATORY: No cough, wheezing, SOB CARDIAC: No chest pain, palpitations, lower extremity edema  GI: No abdominal pain, No N/V/D or constipation, No heartburn or reflux  GU: No dysuria, frequency or urgency, or incontinence  MUSCULOSKELETAL: No unrelieved bone/joint pain NEUROLOGIC: No headache, dizziness  PSYCHIATRIC: No overt anxiety or sadness  Filed Vitals:   12/09/15 1800  BP: 139/84  Pulse: 73  Temp: 98.4 F (36.9 C)  Resp: 16    Physical Exam  GENERAL APPEARANCE: Alert, conversant, No acute distress  SKIN: No diaphoresis rash, or wounds HEENT: Unremarkable RESPIRATORY: Breathing is even, unlabored. Lung sounds are clear   CARDIOVASCULAR: Heart RRR no murmurs, rubs or gallops. No peripheral edema  GASTROINTESTINAL: Abdomen is soft, non-tender, not distended w/ normal bowel sounds.  GENITOURINARY: Bladder non tender, not distended  MUSCULOSKELETAL: No abnormal joints or musculature NEUROLOGIC: Cranial nerves 2-12 grossly intact. Moves all extremities PSYCHIATRIC: Mood and affect appropriate to situation, no behavioral issues  Patient Active Problem List   Diagnosis Date Noted  . Encounter for family conference with patient present 12/14/2015  . Frequent falls 11/05/2015  . Candida UTI 10/01/2015  . FTT (failure to thrive) in adult 09/25/2015  . Neurogenic bladder 09/25/2015  . Inflammatory arthritis (HCC) 08/21/2015  . 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 Bacteremia due to Klebsiella pneumoniae 08/07/2015  . Sepsis due to Klebsiella pneumoniae (HCC) 08/07/2015  . DNR (do not resuscitate) 08/06/2015  . Septic shock (HCC) 08/03/2015  . Hypotension 08/03/2015  . Leukocytosis 08/03/2015  . Catheter-associated urinary tract infection (HCC) 08/03/2015  . Palliative care  encounter   . Acute renal failure superimposed on stage 3 chronic kidney disease (HCC) 07/13/2015  . Myasthenia gravis (HCC) 07/13/2015  . Acute encephalopathy 07/04/2015  . Fecal impaction (HCC) 07/04/2015  . Dementia 07/04/2015  . Pressure ulcer 07/04/2015  . AP (abdominal pain)   . Hypoxia   . Altered mental status   . Consciousness loss of 05/29/2015  . Vertigo, central origin 05/29/2015  . Dizziness and giddiness 05/29/2015  . Weakness 05/29/2015  . Cerebral brain hemorrhage (HCC) 05/29/2015  . TIA (transient ischemic attack) 05/29/2015  . Proximal limb muscle weakness 05/29/2015  .  Ataxia 05/29/2015  . Syncope 03/31/2015  . CKD (chronic kidney disease), stage III 03/31/2015  . HTN (hypertension) 03/31/2015  . AF (atrial fibrillation) (HCC) 03/31/2015  . Dyslipidemia 03/31/2015    CBC    Component Value Date/Time   WBC 7.9 11/09/2015 0710   RBC 4.24 11/09/2015 0710   HGB 11.9* 11/09/2015 0710   HCT 37.1* 11/09/2015 0710   PLT 159 11/09/2015 0710   MCV 87.5 11/09/2015 0710   LYMPHSABS 0.6* 11/05/2015 1258   MONOABS 2.6* 11/05/2015 1258   EOSABS 0.0 11/05/2015 1258   BASOSABS 0.0 11/05/2015 1258    CMP     Component Value Date/Time   NA 144 11/09/2015 0710   NA 140 05/29/2015 1319   K 3.7 11/09/2015 0710   CL 113* 11/09/2015 0710   CO2 24 11/09/2015 0710   GLUCOSE 106* 11/09/2015 0710   GLUCOSE 152* 05/29/2015 1319   BUN 11 11/09/2015 0710   BUN 22 05/29/2015 1319   CREATININE 0.76 11/09/2015 0710   CALCIUM 8.4* 11/09/2015 0710   PROT 5.1* 11/06/2015 0330   PROT 7.7 05/29/2015 1319   ALBUMIN 2.5* 11/06/2015 0330   ALBUMIN 4.0 05/29/2015 1319   AST 26 11/06/2015 0330   ALT 22 11/06/2015 0330   ALKPHOS 64 11/06/2015 0330   BILITOT 0.8 11/06/2015 0330   BILITOT 1.2 05/29/2015 1319   GFRNONAA >60 11/09/2015 0710   GFRAA >60 11/09/2015 0710    Assessment and Plan  Encounter for family conference with patient present Pt's daughter and daugter in law  spoke at great length about death, dying, what to expect, supporting pt wishes, etc.. In short Mr Kamdon has expressed desire not to go through another hospitalization like his 2 prior. He is ready to die. He is ready to see his wife. Everyone is pretty much on board except for 1 son who has been away a lot in the past years. I told them if they could concede to him that he never has to go to the hospital again that would go a long way to buy them more time to discuss particulars like tx that cold be done in the SNF. They know he does not want intubation and PEG tube is a no. The family has a meeting set up with palliative for tomorrow. It is with Tommi so I know it will go well. Will follow   Time spent 45 min;> 50% of time with patient was spent reviewing records, labs, tests and studies, counseling and developing plan of care  Margit Hanks, MD

## 2015-12-10 DIAGNOSIS — R531 Weakness: Secondary | ICD-10-CM | POA: Diagnosis not present

## 2015-12-14 ENCOUNTER — Encounter: Payer: Self-pay | Admitting: Internal Medicine

## 2015-12-14 DIAGNOSIS — Z7189 Other specified counseling: Secondary | ICD-10-CM | POA: Insufficient documentation

## 2015-12-14 NOTE — Assessment & Plan Note (Signed)
Pt's daughter and daugter in law spoke at great length about death, dying, what to expect, supporting pt wishes, etc.. In short Seth [name] has expressed desire not to go through another hospitalization like his 2 prior. He is ready to die. He is ready to see his wife. Everyone is pretty much on board except for 1 son who has been away a lot in the past years. I told them if they could concede to him that he never has to go to the hospital again that would go a long way to buy them more time to discuss particulars like tx that cold be done in the SNF. They know he does not want intubation and PEG tube is a no. The family has a meeting set up with palliative for tomorrow. It is with Tommi so I know it will go well. Will follow

## 2015-12-20 DIAGNOSIS — R3915 Urgency of urination: Secondary | ICD-10-CM | POA: Diagnosis not present

## 2015-12-20 DIAGNOSIS — N401 Enlarged prostate with lower urinary tract symptoms: Secondary | ICD-10-CM | POA: Diagnosis not present

## 2016-01-21 DIAGNOSIS — R338 Other retention of urine: Secondary | ICD-10-CM | POA: Diagnosis not present

## 2016-01-28 DIAGNOSIS — E785 Hyperlipidemia, unspecified: Secondary | ICD-10-CM | POA: Diagnosis not present

## 2016-01-28 LAB — LIPID PANEL
CHOLESTEROL: 155 mg/dL (ref 0–200)
HDL: 42 mg/dL (ref 35–70)
LDL CALC: 78 mg/dL
TRIGLYCERIDES: 177 mg/dL — AB (ref 40–160)

## 2016-01-30 DIAGNOSIS — F0390 Unspecified dementia without behavioral disturbance: Secondary | ICD-10-CM | POA: Diagnosis not present

## 2016-01-30 DIAGNOSIS — F329 Major depressive disorder, single episode, unspecified: Secondary | ICD-10-CM | POA: Diagnosis not present

## 2016-02-07 ENCOUNTER — Encounter: Payer: Self-pay | Admitting: Internal Medicine

## 2016-02-07 ENCOUNTER — Non-Acute Institutional Stay (SKILLED_NURSING_FACILITY): Payer: Medicare Other | Admitting: Internal Medicine

## 2016-02-07 DIAGNOSIS — F0391 Unspecified dementia with behavioral disturbance: Secondary | ICD-10-CM | POA: Diagnosis not present

## 2016-02-07 DIAGNOSIS — I480 Paroxysmal atrial fibrillation: Secondary | ICD-10-CM

## 2016-02-07 DIAGNOSIS — I10 Essential (primary) hypertension: Secondary | ICD-10-CM

## 2016-02-07 NOTE — Progress Notes (Signed)
Patient ID: Seth [name] H Purdom, male   DOB: 06/29/1925, 82 y.o.   MRN: [id number] [id number]: [id number] Name: Seth [name]  Sex: male Age: 82 y.o. DOB: 06/29/1925  [facility] #: [facility] Facility/Room: 405 P Level Of Care: SNF Provider: Margit HanksAlexander, Tej Murdaugh D Emergency Contacts: Extended Emergency Contact Information Primary Emergency Contact: Carr,David & Neoma LamingEllen  [place] Home Phone: [phone] Mobile Phone: [phone] Relation: Son Secondary Emergency Contact: Richardean ChimeraSmoak,Mark  [place] Home Phone: [phone] Relation: None  Code Status:   Allergies: Ceftin  Chief Complaint  Patient presents with  . Medical Management of Chronic Issues    Routine Visit    HPI: Patient is 82 y.o. male who is being seen for routine issues of HTN, AF and dementia.  Past Medical History  Diagnosis Date  . Hypertension   . Hyperlipidemia   . Aortic stenosis   . High cholesterol   . A-fib (HCC)   . History of TIAs   . Multiple falls   . Urinary incontinence   . Dementia     short term memory loss    Past Surgical History  Procedure Laterality Date  . Hernia repair      1970s  . Back surgery  2006  . Cervical laminectomy  2002 ?  [name]. Insertion of suprapubic catheter N/A 06/26/2015    Procedure: INSERTION OF SUPRAPUBIC CATHETER;  Surgeon: Malen GauzePatrick L McKenzie, MD;  Location: [facility];  Service: Urology;  Laterality: N/A;  . Cystoscopy N/A 06/26/2015    Procedure: CYSTOSCOPY;  Surgeon: Malen GauzePatrick L McKenzie, MD;  Location: [facility];  Service: Urology;  Laterality: N/A;      Medication List       This list is accurate as of: 02/07/16 11:59 PM.  Always use your most recent med list.               acetaminophen 500 MG tablet  Commonly known as:  TYLENOL  Take 500 mg by mouth every 6 (six) hours as needed for mild pain.     ELIQUIS 2.5 MG Tabs tablet  Generic drug:  apixaban  Take 2.5 mg by mouth 2 (two) times daily.     escitalopram 10 MG tablet  Commonly known as:   LEXAPRO  Take 10 mg by mouth daily.     metoprolol succinate 50 MG 24 hr tablet  Commonly known as:  TOPROL-XL  Take 50 mg by mouth every morning. Take with or immediately following a meal.     mirtazapine 7.5 MG tablet  Commonly known as:  REMERON  Take 7.5 mg by mouth at bedtime.     multivitamin with minerals Tabs tablet  Take 1 tablet by mouth daily.     MYRBETRIQ 25 MG Tb24 tablet  Generic drug:  mirabegron ER  Take 25 mg by mouth daily.     OVER THE COUNTER MEDICATION  Take 1 Dose by mouth at bedtime. Offer "magic cup" as bedtime snack related to weight loss     polyethylene glycol packet  Commonly known as:  MIRALAX / GLYCOLAX  Take 17 g by mouth daily as needed for moderate constipation.     predniSONE 10 MG tablet  Commonly known as:  DELTASONE  Take 1 tablet (10 mg total) by mouth daily with breakfast.     pyridostigmine 60 MG tablet  Commonly known as:  MESTINON  Take 1 tablet (60 mg total) by mouth 3 (three) times daily.     senna 8.6 MG  Tabs tablet  Commonly known as:  SENOKOT  Take 1 tablet by mouth 2 (two) times daily.     simvastatin 10 MG tablet  Commonly known as:  ZOCOR  Take 10 mg by mouth daily.     traMADol 50 MG tablet  Commonly known as:  ULTRAM  Take 1 tablet (50 mg total) by mouth every 6 (six) hours as needed for moderate pain.     Vitamin D3 2000 units Tabs  Take 2,000 Units by mouth daily.        Meds ordered this encounter  Medications  . escitalopram (LEXAPRO) 10 MG tablet    Sig: Take 10 mg by mouth daily.    Immunization History  Administered Date(s) Administered  . Influenza,inj,Quad PF,36+ Mos 07/05/2015  . PPD Test 11/09/2015  . Pneumococcal Polysaccharide-23 07/05/2015  . Tdap 03/31/2015    Social History  Substance Use Topics  . Smoking status: Former Games developer  . Smokeless tobacco: Never Used  . Alcohol Use: No    Review of Systems  DATA OBTAINED: from patient GENERAL:  no fevers, fatigue, appetite  changes SKIN: No itching, rash HEENT: No complaint RESPIRATORY: No cough, wheezing, SOB CARDIAC: No chest pain, palpitations, lower extremity edema  GI: No abdominal pain, No N/V/D or constipation, No heartburn or reflux  GU: No dysuria, frequency or urgency, or incontinence  MUSCULOSKELETAL: No unrelieved bone/joint pain NEUROLOGIC: No headache, dizziness  PSYCHIATRIC: No overt anxiety or sadness  Filed Vitals:   02/07/16 1535  BP: 136/83  Pulse: 67  Temp: 97.7 F (36.5 C)  Resp: 19    Physical Exam  GENERAL APPEARANCE: Alert, conversant, No acute distress  SKIN: No diaphoresis rash HEENT: Unremarkable RESPIRATORY: Breathing is even, unlabored. Lung sounds are clear   CARDIOVASCULAR: Heart RRR no murmurs, rubs or gallops. No peripheral edema  GASTROINTESTINAL: Abdomen is soft, non-tender, not distended w/ normal bowel sounds.  GENITOURINARY: Bladder non tender, not distended  MUSCULOSKELETAL: No abnormal joints or musculature NEUROLOGIC: Cranial nerves 2-12 grossly intact. Moves all extremities PSYCHIATRIC: Mood and affect appropriate to situation, no behavioral issues  Patient Active Problem List   Diagnosis Date Noted  . Encounter for family conference with patient present 12/14/2015  . Frequent falls 11/05/2015  . Candida UTI 10/01/2015  . FTT (failure to thrive) in adult 09/25/2015  . Neurogenic bladder 09/25/2015  . Inflammatory arthritis (HCC) 08/21/2015  . 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 Bacteremia due to Klebsiella pneumoniae 08/07/2015  . Sepsis due to Klebsiella pneumoniae (HCC) 08/07/2015  . DNR (do not resuscitate) 08/06/2015  . Septic shock (HCC) 08/03/2015  . Hypotension 08/03/2015  . Leukocytosis 08/03/2015  . Catheter-associated urinary tract infection (HCC) 08/03/2015  . Palliative care encounter   . Acute renal failure superimposed on stage 3 chronic kidney disease (HCC) 07/13/2015  . Myasthenia gravis (HCC) 07/13/2015  . Acute  encephalopathy 07/04/2015  . Fecal impaction (HCC) 07/04/2015  . Dementia 07/04/2015  . Pressure ulcer 07/04/2015  . AP (abdominal pain)   . Hypoxia   . Altered mental status   . Consciousness loss of 05/29/2015  . Vertigo, central origin 05/29/2015  . Dizziness and giddiness 05/29/2015  . Weakness 05/29/2015  . Cerebral brain hemorrhage (HCC) 05/29/2015  . TIA (transient ischemic attack) 05/29/2015  . Proximal limb muscle weakness 05/29/2015  . Ataxia 05/29/2015  . Syncope 03/31/2015  . CKD (chronic kidney disease), stage III 03/31/2015  . HTN (hypertension) 03/31/2015  . AF (atrial fibrillation) (HCC) 03/31/2015  . Dyslipidemia 03/31/2015  CBC    Component Value Date/Time   WBC 7.9 11/09/2015 0710   RBC 4.24 11/09/2015 0710   HGB 11.9* 11/09/2015 0710   HCT 37.1* 11/09/2015 0710   PLT 159 11/09/2015 0710   MCV 87.5 11/09/2015 0710   LYMPHSABS 0.6* 11/05/2015 1258   MONOABS 2.6* 11/05/2015 1258   EOSABS 0.0 11/05/2015 1258   BASOSABS 0.0 11/05/2015 1258    CMP     Component Value Date/Time   NA 144 11/09/2015 0710   NA 140 05/29/2015 1319   K 3.7 11/09/2015 0710   CL 113* 11/09/2015 0710   CO2 24 11/09/2015 0710   GLUCOSE 106* 11/09/2015 0710   GLUCOSE 152* 05/29/2015 1319   BUN 11 11/09/2015 0710   BUN 22 05/29/2015 1319   CREATININE 0.76 11/09/2015 0710   CALCIUM 8.4* 11/09/2015 0710   PROT 5.1* 11/06/2015 0330   PROT 7.7 05/29/2015 1319   ALBUMIN 2.5* 11/06/2015 0330   ALBUMIN 4.0 05/29/2015 1319   AST 26 11/06/2015 0330   ALT 22 11/06/2015 0330   ALKPHOS 64 11/06/2015 0330   BILITOT 0.8 11/06/2015 0330   BILITOT 1.2 05/29/2015 1319   GFRNONAA >60 11/09/2015 0710   GFRAA >60 11/09/2015 0710    Assessment and Plan  HTN (hypertension) Controlled on metoprolol 50 XL; plan - cont currrent meds  AF (atrial fibrillation) (HCC) Rate controlled on metoprolol and prophylaxis with eliquis;plan - cont current meds  Dementia Pt has been started on  remeron for appetitie and mood and lexapro for depression per family wishes. Pt is not so demented that he doesn't know what he wants. He is not afraid to die and is ready to go ;has a supportive family who are struggling a little; pallative care was consulted by me and pt is now do not hospitalize for antibiotic tx but may use po's at [facility]    Merrilee Seashore, MD

## 2016-02-09 ENCOUNTER — Encounter: Payer: Self-pay | Admitting: Internal Medicine

## 2016-02-09 NOTE — Assessment & Plan Note (Signed)
Rate controlled on metoprolol and prophylaxis with eliquis;plan - cont current meds

## 2016-02-09 NOTE — Assessment & Plan Note (Signed)
Controlled on metoprolol 50 XL; plan - cont currrent meds

## 2016-02-09 NOTE — Assessment & Plan Note (Signed)
Pt has been started on remeron for appetitie and mood and lexapro for depression per family wishes. Pt is not so demented that he doesn't know what he wants. He is not afraid to die and is ready to go ;has a supportive family who are struggling a little; pallative care was consulted by me and pt is now do not hospitalize for antibiotic tx but [facility]

## 2016-02-18 DIAGNOSIS — R338 Other retention of urine: Secondary | ICD-10-CM | POA: Diagnosis not present

## 2016-02-18 DIAGNOSIS — E785 Hyperlipidemia, unspecified: Secondary | ICD-10-CM | POA: Diagnosis not present

## 2016-02-19 LAB — LIPID PANEL
CHOLESTEROL: 153 mg/dL (ref 0–200)
HDL: 48 mg/dL (ref 35–70)
LDL CALC: 65 mg/dL
TRIGLYCERIDES: 198 mg/dL — AB (ref 40–160)

## 2016-02-27 DIAGNOSIS — R627 Adult failure to thrive: Secondary | ICD-10-CM | POA: Diagnosis not present

## 2016-03-04 ENCOUNTER — Encounter: Payer: Self-pay | Admitting: Internal Medicine

## 2016-03-04 ENCOUNTER — Non-Acute Institutional Stay (SKILLED_NURSING_FACILITY): Payer: Medicare Other | Admitting: Internal Medicine

## 2016-03-04 DIAGNOSIS — M6289 Other specified disorders of muscle: Secondary | ICD-10-CM | POA: Diagnosis not present

## 2016-03-04 DIAGNOSIS — G7000 Myasthenia gravis without (acute) exacerbation: Secondary | ICD-10-CM

## 2016-03-04 DIAGNOSIS — M6281 Muscle weakness (generalized): Secondary | ICD-10-CM

## 2016-03-04 DIAGNOSIS — R627 Adult failure to thrive: Secondary | ICD-10-CM

## 2016-03-04 NOTE — Progress Notes (Signed)
MRN: [id number] Name: Seth [name]  Sex: male Age: 82 y.o. DOB: 1924-12-08  [facility] Facility/Room:405 P Level Of Care: SNF Provider: Merrilee SeashoreAlexander, Josean Lycan MD  Emergency Contacts: Extended Emergency Contact Information Primary Emergency Contact: Stammer,David & Neoma LamingEllen  [place] Home Phone: [phone] Mobile Phone: [phone] Relation: Son Secondary Emergency Contact: Richardean ChimeraSmoak,Mark  [place] Home Phone: [phone] Relation: None  Code Status: DNR  Allergies: Ceftin  Chief Complaint  Patient presents with  . Medical Management of Chronic Issues    HPI: Patient is 82 y.o. male who is bein seen for routine issues of MG, proximal muscle weakness and FTT which is improving.  Past Medical History  Diagnosis Date  . Hypertension   . Hyperlipidemia   . Aortic stenosis   . High cholesterol   . A-fib (HCC)   . History of TIAs   . Multiple falls   . Urinary incontinence   . Dementia     short term memory loss    Past Surgical History  Procedure Laterality Date  . Hernia repair      1970s  . Back surgery  2006  . Cervical laminectomy  2002 ?  [name]. Insertion of suprapubic catheter N/A 06/26/2015    Procedure: INSERTION OF SUPRAPUBIC CATHETER;  Surgeon: Malen GauzePatrick L McKenzie, MD;  Location: [facility];  Service: Urology;  Laterality: N/A;  . Cystoscopy N/A 06/26/2015    Procedure: CYSTOSCOPY;  Surgeon: Malen GauzePatrick L McKenzie, MD;  Location: [facility];  Service: Urology;  Laterality: N/A;      Medication List       This list is accurate as of: 03/04/16 11:59 PM.  Always use your most recent med list.               acetaminophen 500 MG tablet  Commonly known as:  TYLENOL  Take 500 mg by mouth every 6 (six) hours as needed for mild pain.     docusate sodium 100 MG capsule  Commonly known as:  COLACE  Take 100 mg by mouth 2 (two) times daily.     ELIQUIS 2.5 MG Tabs tablet  Generic drug:  apixaban  Take 2.5 mg by mouth 2 (two) times daily.      ENSURE  Take 237 mLs by mouth 3 (three) times daily between meals.     escitalopram 10 MG tablet  Commonly known as:  LEXAPRO  Take 10 mg by mouth daily.     metoprolol succinate 50 MG 24 hr tablet  Commonly known as:  TOPROL-XL  Take 50 mg by mouth every morning. Take with or immediately following a meal.     mirtazapine 7.5 MG tablet  Commonly known as:  REMERON  Take 7.5 mg by mouth at bedtime.     multivitamin with minerals Tabs tablet  Take 1 tablet by mouth daily.     MYRBETRIQ 25 MG Tb24 tablet  Generic drug:  mirabegron ER  Take 25 mg by mouth daily.     OVER THE COUNTER MEDICATION  Take 1 Dose by mouth at bedtime. Offer "magic cup" as bedtime snack related to weight loss     predniSONE 10 MG tablet  Commonly known as:  DELTASONE  Take 1 tablet (10 mg total) by mouth daily with breakfast.     pyridostigmine 60 MG tablet  Commonly known as:  MESTINON  Take 1 tablet (60 mg total) by mouth 3 (three) times daily.     senna 8.6 MG Tabs  tablet  Commonly known as:  SENOKOT  Take 1 tablet by mouth 2 (two) times daily.     simvastatin 10 MG tablet  Commonly known as:  ZOCOR  Take 10 mg by mouth daily.     Vitamin D3 2000 units Tabs  Take 2,000 Units by mouth daily.        Meds ordered this encounter  Medications  . ENSURE (ENSURE)    Sig: Take 237 mLs by mouth 3 (three) times daily between meals.  . docusate sodium (COLACE) 100 MG capsule    Sig: Take 100 mg by mouth 2 (two) times daily.    Immunization History  Administered Date(s) Administered  . Influenza,inj,Quad PF,36+ Mos 07/05/2015  . PPD Test 11/09/2015  . Pneumococcal Polysaccharide-23 07/05/2015  . Tdap 03/31/2015    Social History  Substance Use Topics  . Smoking status: Former Games developer  . Smokeless tobacco: Never Used  . Alcohol Use: No    Review of Systems  DATA OBTAINED: from patient, nurse GENERAL:  no fevers, fatigue, appetite changes SKIN: No itching, rash HEENT: No  complaint RESPIRATORY: No cough, wheezing, SOB CARDIAC: No chest pain, palpitations, lower extremity edema  GI: No abdominal pain, No N/V/D or constipation, No heartburn or reflux  GU: No dysuria, frequency or urgency, or incontinence  MUSCULOSKELETAL: No unrelieved bone/joint pain NEUROLOGIC: No headache, dizziness  PSYCHIATRIC: No overt anxiety or sadness  Filed Vitals:   03/04/16 0951  BP: 136/83  Pulse: 68  Temp: 97.7 F (36.5 C)  Resp: 20    Physical Exam  GENERAL APPEARANCE: Alert, conversant, No acute distress  SKIN: No diaphoresis rash HEENT: Unremarkable RESPIRATORY: Breathing is even, unlabored. Lung sounds are clear   CARDIOVASCULAR: Heart RRR no murmurs, rubs or gallops. No peripheral edema  GASTROINTESTINAL: Abdomen is soft, non-tender, not distended w/ normal bowel sounds.  GENITOURINARY: Bladder non tender, not distended  MUSCULOSKELETAL: No abnormal joints or musculature NEUROLOGIC: Cranial nerves 2-12 grossly intact. Moves all extremities PSYCHIATRIC: Mood and affect appropriate to situation, no behavioral issues  Patient Active Problem List   Diagnosis Date Noted  . Encounter for family conference with patient present 12/14/2015  . Frequent falls 11/05/2015  . Candida UTI 10/01/2015  . FTT (failure to thrive) in adult 09/25/2015  . Neurogenic bladder 09/25/2015  . Inflammatory arthritis (HCC) 08/21/2015  . 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 Bacteremia due to Klebsiella pneumoniae 08/07/2015  . Sepsis due to Klebsiella pneumoniae (HCC) 08/07/2015  . DNR (do not resuscitate) 08/06/2015  . Septic shock (HCC) 08/03/2015  . Hypotension 08/03/2015  . Leukocytosis 08/03/2015  . Catheter-associated urinary tract infection (HCC) 08/03/2015  . Palliative care encounter   . Acute renal failure superimposed on stage 3 chronic kidney disease (HCC) 07/13/2015  . Myasthenia gravis (HCC) 07/13/2015  . Acute encephalopathy 07/04/2015  . Fecal impaction (HCC)  07/04/2015  . Dementia 07/04/2015  . Pressure ulcer 07/04/2015  . AP (abdominal pain)   . Hypoxia   . Altered mental status   . Consciousness loss of 05/29/2015  . Vertigo, central origin 05/29/2015  . Dizziness and giddiness 05/29/2015  . Weakness 05/29/2015  . Cerebral brain hemorrhage (HCC) 05/29/2015  . TIA (transient ischemic attack) 05/29/2015  . Proximal limb muscle weakness 05/29/2015  . Ataxia 05/29/2015  . Syncope 03/31/2015  . CKD (chronic kidney disease), stage III 03/31/2015  . HTN (hypertension) 03/31/2015  . AF (atrial fibrillation) (HCC) 03/31/2015  . Dyslipidemia 03/31/2015    CBC    Component Value  Date/Time   WBC 7.9 11/09/2015 0710   RBC 4.24 11/09/2015 0710   HGB 11.9* 11/09/2015 0710   HCT 37.1* 11/09/2015 0710   PLT 159 11/09/2015 0710   MCV 87.5 11/09/2015 0710   LYMPHSABS 0.6* 11/05/2015 1258   MONOABS 2.6* 11/05/2015 1258   EOSABS 0.0 11/05/2015 1258   BASOSABS 0.0 11/05/2015 1258    CMP     Component Value Date/Time   NA 144 11/09/2015 0710   NA 140 05/29/2015 1319   K 3.7 11/09/2015 0710   CL 113* 11/09/2015 0710   CO2 24 11/09/2015 0710   GLUCOSE 106* 11/09/2015 0710   GLUCOSE 152* 05/29/2015 1319   BUN 11 11/09/2015 0710   BUN 22 05/29/2015 1319   CREATININE 0.76 11/09/2015 0710   CALCIUM 8.4* 11/09/2015 0710   PROT 5.1* 11/06/2015 0330   PROT 7.7 05/29/2015 1319   ALBUMIN 2.5* 11/06/2015 0330   ALBUMIN 4.0 05/29/2015 1319   AST 26 11/06/2015 0330   ALT 22 11/06/2015 0330   ALKPHOS 64 11/06/2015 0330   BILITOT 0.8 11/06/2015 0330   BILITOT 1.2 05/29/2015 1319   GFRNONAA >60 11/09/2015 0710   GFRAA >60 11/09/2015 0710    Assessment and Plan  Myasthenia gravis (HCC) Mestinon now at 30 mg TID and pt without symptoms or c/o; cont current med  Proximal limb muscle weakness Still responding well to reduced dose of mestinon and prednisone 10 mg; is walking with restorative but still don't think strong enough to stop  prednisone, probably never  FTT (failure to thrive) in adult Pt seems to have leveled out; he is doing very well at this time; will cont to monitor    Merrilee Seashore  MD

## 2016-03-06 ENCOUNTER — Encounter: Payer: Self-pay | Admitting: Internal Medicine

## 2016-03-06 NOTE — Assessment & Plan Note (Signed)
Mestinon now at 30 mg TID and pt without symptoms or c/o; cont current med

## 2016-03-06 NOTE — Assessment & Plan Note (Signed)
Pt seems to have leveled out; he is doing very well at this time; will cont to monitor

## 2016-03-06 NOTE — Assessment & Plan Note (Signed)
Still responding well to reduced dose of mestinon and prednisone 10 mg; is walking with restorative but still don't think strong enough to stop prednisone, probably never

## 2016-03-19 IMAGING — CT CT HEAD W/O CM
2 series · 16 of 30 positions shown, 18 images · non-contrast
Comparison: CT of the head performed 05/10/2015

CLINICAL DATA: Found on floor. Concern for head injury. Initial
encounter.

EXAM:
CT HEAD WITHOUT CONTRAST
TECHNIQUE: Contiguous axial images were obtained from the base of the skull
through the vertex without intravenous contrast.

[Series 201: head w/o, idose (1) · axial · non-contrast · 0.49mm/px · z∈[+66,+196]mm · 8 of 34 slices shown, 10 images]
[im 4/34  brain]
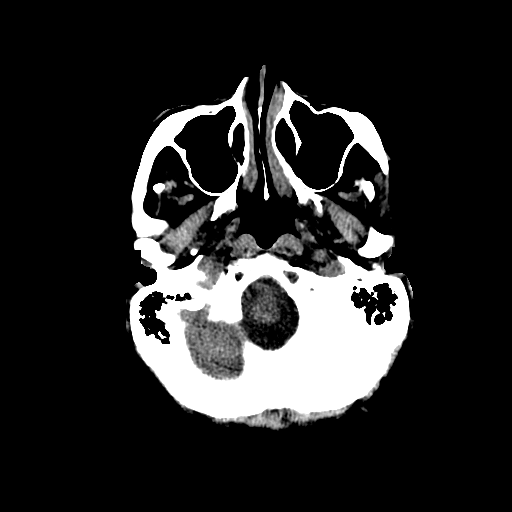
[im 4/34  bone]
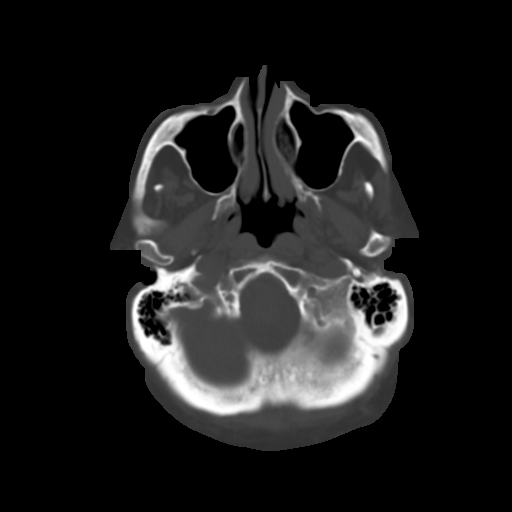
[im 8/34  brain]
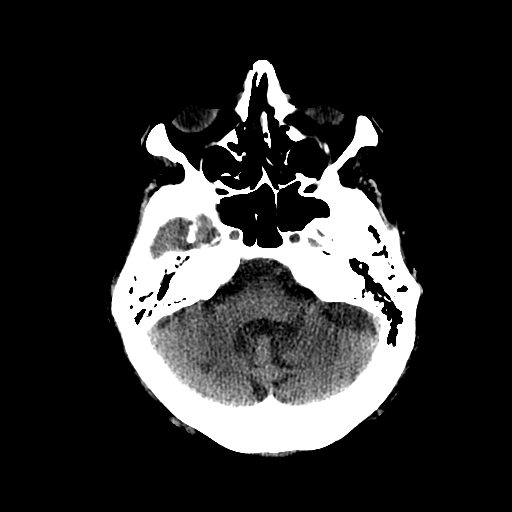
[im 12/34  brain]
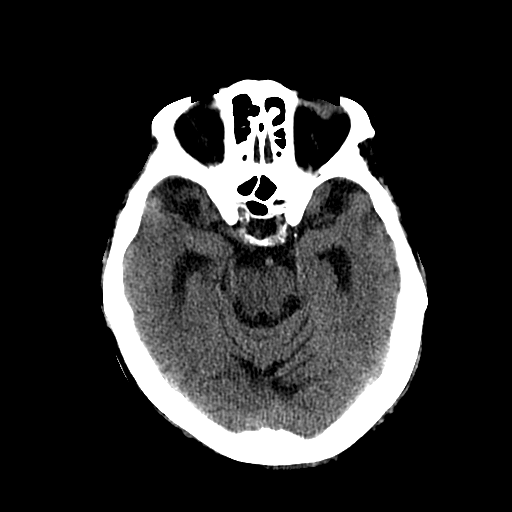
[im 15/34  brain]
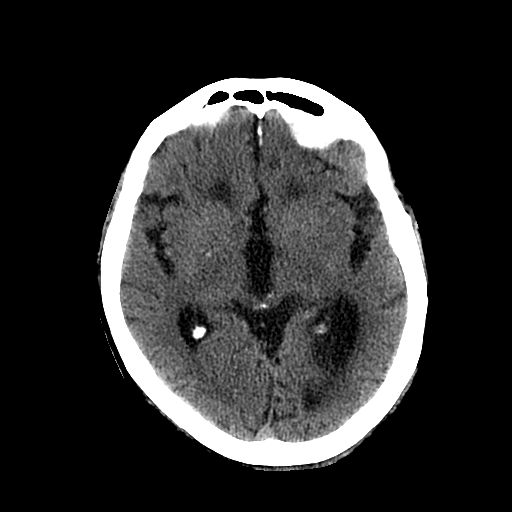
[im 19/34  brain]
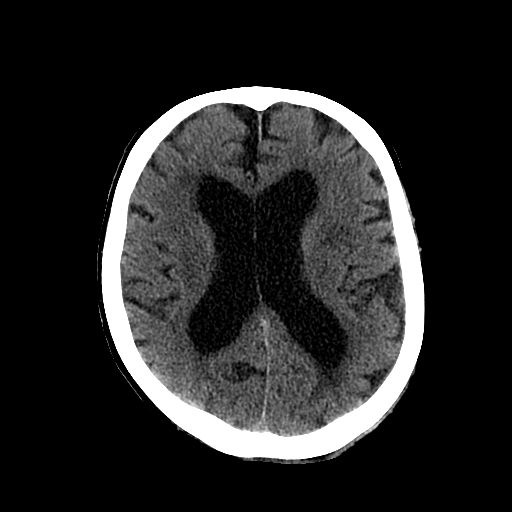
[im 19/34  bone]
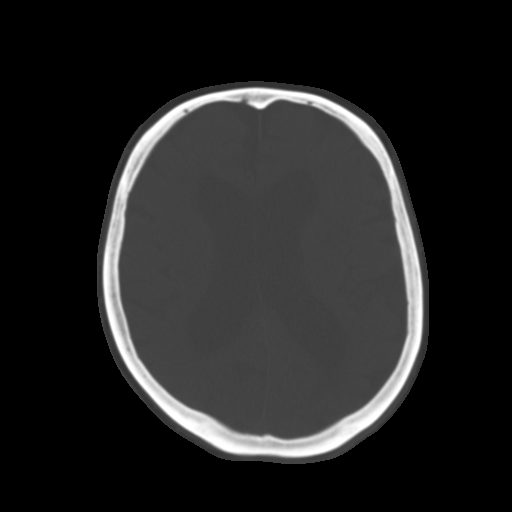
[im 23/34  brain]
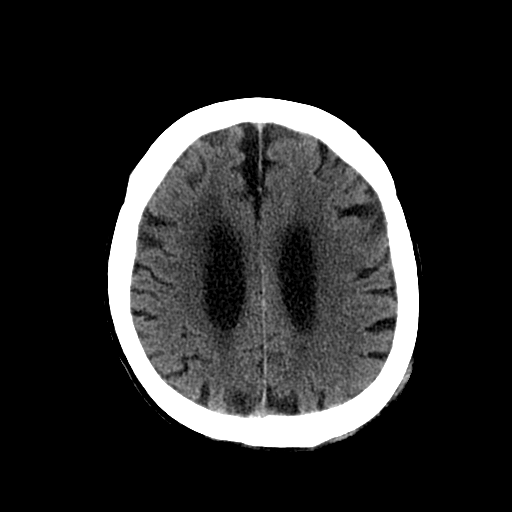
[im 26/34  brain]
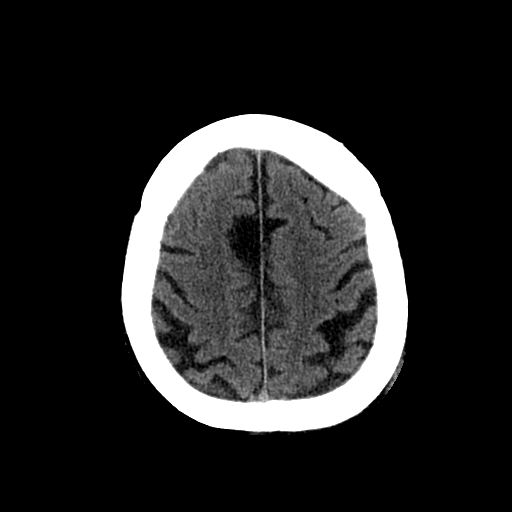
[im 30/34  brain]
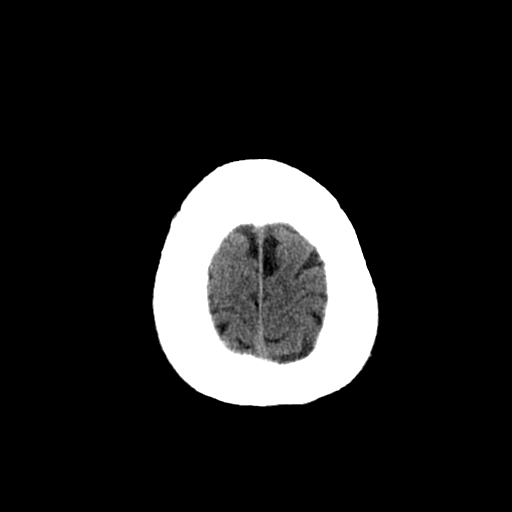

[Series 202: head w/o bone, idose (1) · axial · non-contrast · 0.49mm/px · z∈[+67,+197]mm · 8 of 68 slices shown]
[im 8/68  bone]
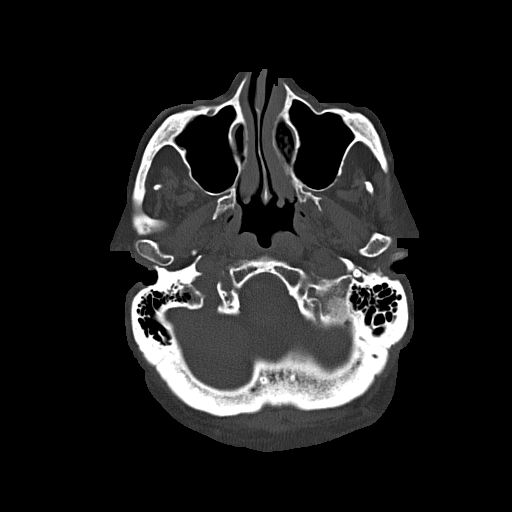
[im 15/68  bone]
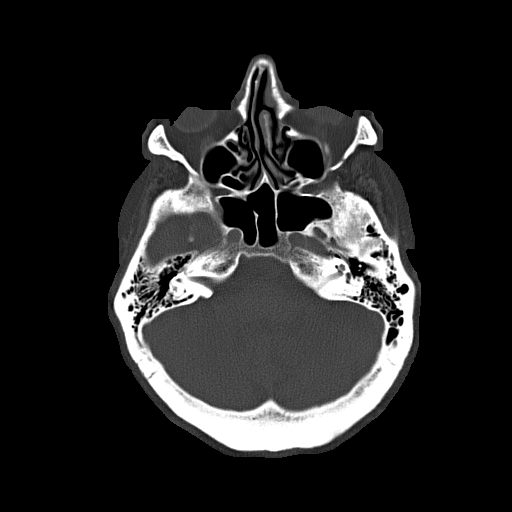
[im 22/68  bone]
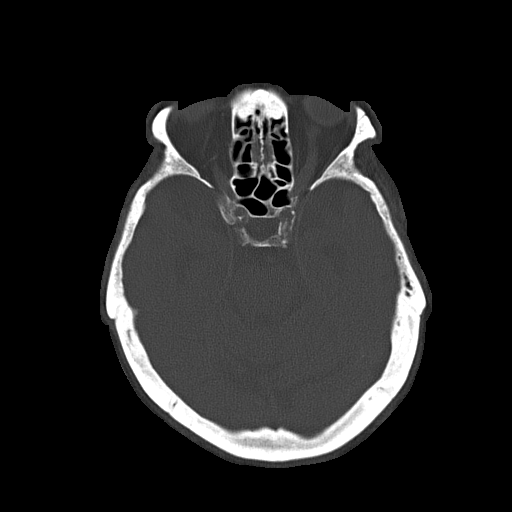
[im 29/68  bone]
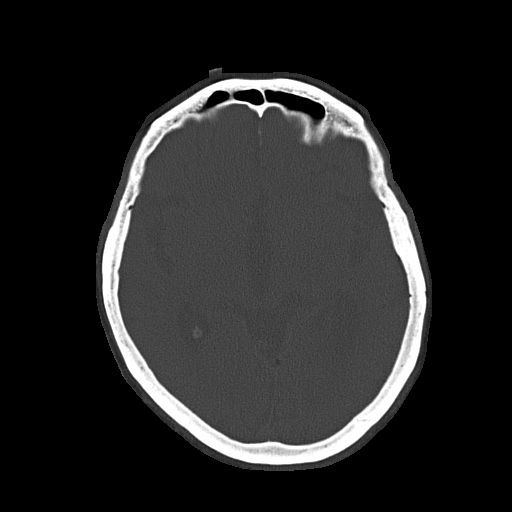
[im 39/68  bone]
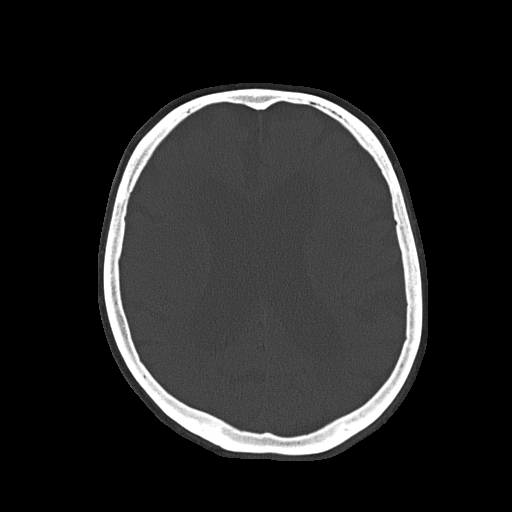
[im 46/68  bone]
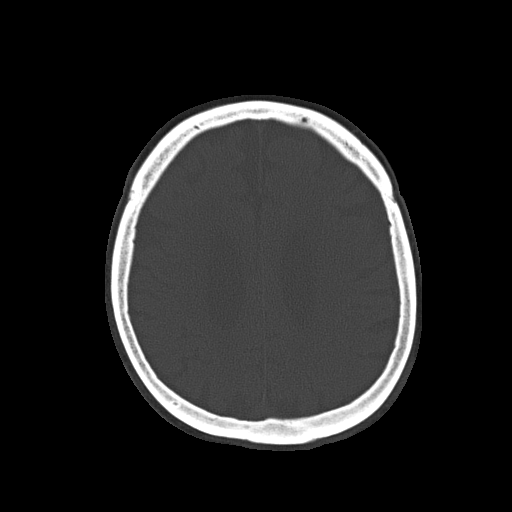
[im 53/68  bone]
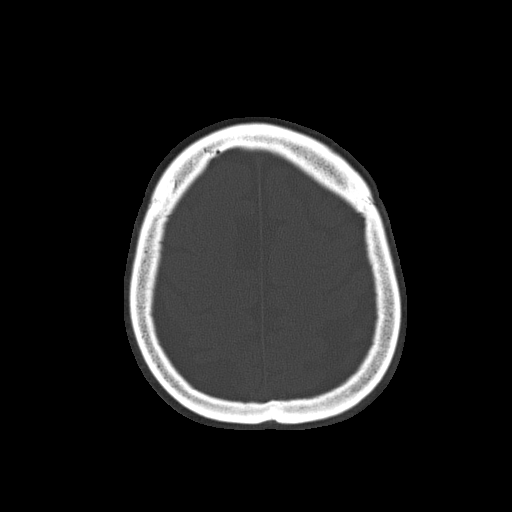
[im 60/68  bone]
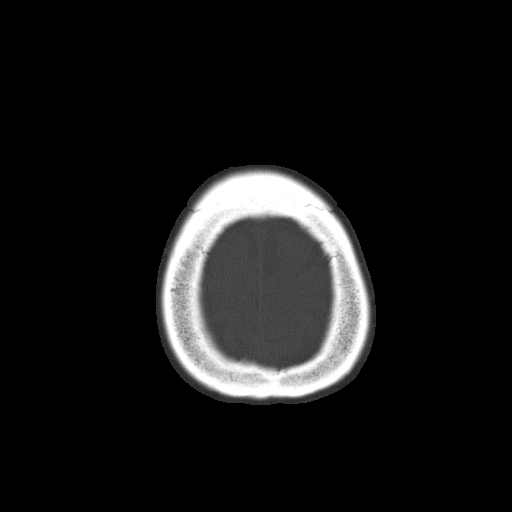

[16 of 30 positions shown; findings below may reference images not displayed]

FINDINGS: There is no evidence of acute infarction, mass lesion, or intra- or
extra-axial hemorrhage on CT.

Prominence of the ventricles and sulci reflects moderate cortical
volume loss. Cerebellar atrophy is noted. Chronic lacunar infarcts
are seen at the right cerebellar hemisphere. Scattered
periventricular and subcortical white matter change likely reflects
small vessel ischemic microangiopathy.

The brainstem and fourth ventricle are within normal limits. The
basal ganglia are unremarkable in appearance. The cerebral
hemispheres demonstrate grossly normal gray-white differentiation.
No mass effect or midline shift is seen.

There is no evidence of fracture; visualized osseous structures are
unremarkable in appearance. The orbits are within normal limits. The
paranasal sinuses and mastoid air cells are well-aerated. Soft
tissue injury is noted at the occiput, with associated laceration.
IMPRESSION: 1. No evidence of traumatic intracranial injury or fracture.
2. Soft tissue injury at the occiput, with associated laceration.
3. Moderate cortical volume loss and scattered small vessel ischemic
microangiopathy.
4. Chronic lacunar infarct at the right cerebellar hemisphere.

## 2016-03-24 DIAGNOSIS — R338 Other retention of urine: Secondary | ICD-10-CM | POA: Diagnosis not present

## 2016-03-30 ENCOUNTER — Encounter: Payer: Self-pay | Admitting: Internal Medicine

## 2016-03-30 ENCOUNTER — Non-Acute Institutional Stay (SKILLED_NURSING_FACILITY): Payer: Medicare Other | Admitting: Internal Medicine

## 2016-03-30 DIAGNOSIS — F0390 Unspecified dementia without behavioral disturbance: Secondary | ICD-10-CM | POA: Diagnosis not present

## 2016-03-30 DIAGNOSIS — R627 Adult failure to thrive: Secondary | ICD-10-CM | POA: Diagnosis not present

## 2016-03-30 DIAGNOSIS — N183 Chronic kidney disease, stage 3 unspecified: Secondary | ICD-10-CM

## 2016-03-30 DIAGNOSIS — N319 Neuromuscular dysfunction of bladder, unspecified: Secondary | ICD-10-CM

## 2016-03-30 DIAGNOSIS — I4891 Unspecified atrial fibrillation: Secondary | ICD-10-CM | POA: Diagnosis not present

## 2016-03-30 DIAGNOSIS — G7000 Myasthenia gravis without (acute) exacerbation: Secondary | ICD-10-CM | POA: Diagnosis not present

## 2016-03-30 NOTE — Progress Notes (Signed)
Location:  [facility] Room Number: 405/P Place of Service:  SNF [phone]) Provider:  Juanetta Beets, MD  Patient Care Team: Margit Hanks, MD as PCP - General (Internal Medicine)  Extended Emergency Contact Information Primary Emergency Contact: Ida,David & Neoma Laming [place] Home Phone: [phone] Mobile Phone: [phone] Relation: Son Secondary Emergency Contact: Richardean Chimera [place] Home Phone: [phone] Relation: None  Code Status:  DNR Goals of care: Advanced Directive information Advanced Directives 03/30/2016  Does patient have an advance directive? Yes  Type of Estate agent of [place];Out of facility DNR (pink MOST or yellow form)  Does patient want to make changes to advanced directive? No - Patient declined  Copy of advanced directive(s) in chart? Yes     Chief Complaint  Patient presents with  . Medical Management of Chronic Issues    Routine visit    HPI:  Pt is a 82 y.o. male seen today for Medical management of numerous chronic medical conditions including myasthenia gravis-failure to thrive-atrial fibrillation-hypertension-chronic kidney disease-  Patient is under palliative care services but actually has done quite well-he has had failure to thrive issues but this has turned around it appears fairly significantly-apparently he is eating and drinking better he thinks he eats too much but I am sure that's not the case.  He has no acute complaints today.  He continues on Mestinon with history of myasthenia gravis-this appears controlled he's had proximal limb weakness I believe this has improved as well he is on prednisone   He also has a history of atrial fibrillation which appears rate controlled he is on Toprol-XL he is also on Eldoquin's for anticoagulation.  In regards to urinary issues he does have a significant urinary history here has  been followed by urology continues with an indwelling catheter this apparently has not been an issue recently.  He also has a history of dementia with depression but this appears to be stabilized he is bright alert interactive when I saw him tonight he does continue on Lexapro.  He also has a history listed of hypertension I got a manual reading of 148/90 this evening-this will need to be monitored.  He also has a history of chronic kidney disease-labs back in January 2017 shows stability with creatinine 0.76 apparently he is eating and drinking fairly well     Past Medical History  Diagnosis Date  . Hypertension   . Hyperlipidemia   . Aortic stenosis   . High cholesterol   . A-fib (HCC)   . History of TIAs   . Multiple falls   . Urinary incontinence   . Dementia     short term memory loss   Past Surgical History  Procedure Laterality Date  . Hernia repair      1970s  . Back surgery  2006  . Cervical laminectomy  2002 ?  [name] Insertion of suprapubic catheter N/A 06/26/2015    Procedure: INSERTION OF SUPRAPUBIC CATHETER;  Surgeon: Malen Gauze, MD;  Location: [facility];  Service: Urology;  Laterality: N/A;  . Cystoscopy N/A 06/26/2015    Procedure: CYSTOSCOPY;  Surgeon: Malen Gauze, MD;  Location: [facility];  Service: Urology;  Laterality: N/A;    Allergies  Allergen Reactions  . Ceftin [Cefuroxime] Diarrhea    Severe diarrhea      Medication List       This list is accurate as of: 03/30/16  4:23 PM.  Always use your most recent med list.               acetaminophen 500 MG tablet  Commonly known as:  TYLENOL  Take 500 mg by mouth every 6 (six) hours as needed for mild pain.     cetaphil cream  Apply topically to both feet as needed for dry skin     docusate sodium 100 MG capsule  Commonly known as:  COLACE  Take 100 mg by mouth 2 (two) times daily.     ELIQUIS 2.5 MG Tabs tablet  Generic drug:  apixaban  Take 2.5 mg by mouth 2 (two) times daily.      ENSURE  Give 1 ensure by mouth QD between meals for weight maintenance     escitalopram 10 MG tablet  Commonly known as:  LEXAPRO  Take 10 mg by mouth daily.     metoprolol succinate 50 MG 24 hr tablet  Commonly known as:  TOPROL-XL  Take 50 mg by mouth every morning. Take with or immediately following a meal.     mirtazapine 7.5 MG tablet  Commonly known as:  REMERON  Take 7.5 mg by mouth at bedtime.     multivitamin with minerals Tabs tablet  Take 1 tablet by mouth daily.     MYRBETRIQ 25 MG Tb24 tablet  Generic drug:  mirabegron ER  Take 25 mg by mouth daily.     OVER THE COUNTER MEDICATION  Take 1 Dose by mouth at bedtime. Offer "magic cup" as bedtime snack related to weight loss     predniSONE 10 MG tablet  Commonly known as:  DELTASONE  Take 1 tablet (10 mg total) by mouth daily with breakfast.     pyridostigmine 60 MG tablet  Commonly known as:  MESTINON  Take 1 tablet (60 mg total) by mouth 3 (three) times daily.     simvastatin 10 MG tablet  Commonly known as:  ZOCOR  Take 10 mg by mouth daily.     Vitamin D3 2000 units Tabs  Take 2,000 Units by mouth daily.        Review of Systems   In general is not complaining any fever or chills says he feels well appears to be in good spirits.  His skin does not complain currently of rashes or itching although he does have a history of rashes.  Head ears eyes nose mouth and throat does not complaining of any sore throat nasal discharge or visual changes.  Respiratory is not complaining of any shortness breath or cough.  Cardiac denies chest pain does not have significant lower extremity edema.  GI is not complaining of abdominal pain nausea vomiting diarrhea or obstipation.  GU does have significant urinary issues has a indwelling catheter does not complaining however of discomfort here.  Musculoskeletal does have proximal limb weakness although I think this has improved he is not complaining of pain  currently.  Neurologic is not complaining of dizziness or headache does have a history of myasthenia gravis.  Psy-- listed history of dementia with depression but this appears to be quite stable  Immunization History  Administered Date(s) Administered  . Influenza,inj,Quad PF,36+ Mos 07/05/2015  . PPD Test 11/09/2015  . Pneumococcal Polysaccharide-23 07/05/2015  . Tdap 03/31/2015   Pertinent  Health Maintenance Due  Topic Date Due  . INFLUENZA VACCINE  05/19/2016  . PNA vac Low Risk Adult (2 of 2 - PCV13) 07/04/2016   Fall Risk  08/22/2015  Falls in the past year? Yes  Number falls in past yr: 2 or more  Injury with Fall? Yes  Risk Factor Category  High Fall Risk  Risk for fall due to : Impaired balance/gait;Impaired mobility  Follow up Falls evaluation completed;Falls prevention discussed   Functional Status Survey:      Physical Exam   Patient is afebrile pulse 72 respirations of 18 blood pressure taken manually 148/90.  In general this is a pleasant elderly male in no distress sitting comfortably in his chair.  His skin is warm and dry.  Oropharynx clear mucous membranes moist.  Eyes pupils appear reactive light he does have prescription lenses visual acuity appears grossly intact.  Chest is clear to auscultation there is no labored breathing.  Heart is regular rate and rhythm with occasional irregular beats he does have a 2/6 murmur-he does not have significant lower extremity edema.  Abdomen soft nontender positive bowel sounds.  GU he does have an indwelling suprapubic catheter draining amber colored urine.  Muscle skeletal does move all extremities 4 with a history of proximal limb weakness although I believe this has improved he appears to have gained strength compared to when I saw him previously.  Neurologic as noted above I do not see any lateralizing findings her speech is clear.  Psych he is pleasant and conversant his dementia appears to be quite  mild he is grossly oriented and alert.    Labs reviewed:  Recent Labs  03/31/15 1718  11/07/15 0334 11/08/15 0436 11/09/15 0710  NA 137  < > 145 139 144  K 4.2  < > 4.1 3.7 3.7  CL 101  < > 114* 109 113*  CO2 28  < > 23 22 24   GLUCOSE 202*  < > 121* 117* 106*  BUN 19  < > 18 13 11   CREATININE 1.25*  < > 1.02 0.84 0.76  CALCIUM 8.9  < > 8.5* 8.2* 8.4*  MG 1.9  --   --   --   --   PHOS 3.6  --   --   --   --   < > = values in this interval not displayed.  Recent Labs  08/02/15 2102 11/05/15 1258 11/06/15 0330  AST 44* 49* 26  ALT 23 28 22   ALKPHOS 87 75 64  BILITOT 0.5 0.6 0.8  PROT 6.3* 6.0* 5.1*  ALBUMIN 2.9* 2.9* 2.5*    Recent Labs  07/03/15 2116  08/02/15 2102  11/05/15 1258  11/07/15 0334 11/08/15 0436 11/09/15 0710  WBC 12.2*  < > 16.5*  < > 28.8*  < > 12.1* 9.6 7.9  NEUTROABS 9.5*  --  16.0*  --  25.6*  --   --   --   --   HGB 14.9  < > 13.4  < > 15.0  < > 12.5* 12.0* 11.9*  HCT 44.7  < > 39.9  < > 44.3  < > 39.9 37.4* 37.1*  MCV 84.8  < > 86.0  < > 87.0  < > 90.7 89.5 87.5  PLT 492*  < > 365  < > 247  < > 159 157 159  < > = values in this interval not displayed. Lab Results  Component Value Date   TSH 0.872 05/29/2015   No results found for: HGBA1C Lab Results  Component Value Date   CHOL 111 01/20/2015   HDL 12* 01/20/2015   LDLCALC 55 01/20/2015   TRIG 220* 01/20/2015  CHOLHDL 9.3 01/20/2015    Significant Diagnostic Results in last 30 days:  No results found.  Assessment/Plan  To thrive-this actually appears to have improved he is continues in a palliative care he is on Remeron for appetite stimulation all this appears to be stable although patient is at risk again with his cup continue medical history including fairly recently diagnosed myasthenia gravis.  #2 in regards to myasthenia gravis he does continue on Mestinon  this appears to be relatively stabilized in regards possible muscle weakness he is on prednisone as well as.  #3  history of urinary issues he does continue on Myrbetriq --- he has been followed by urology he does have suprapubic catheter apparently this has been stable has no acute complaints this evening.  #4 history of atrial fibrillation continues on Toprol XL for rate control-is on Eliquist for anticoagulation this appears to be stable.  #5 history of hypertension-blood pressure is mildly elevated this evening at 148/90 will order blood pressure checks daily with a log for review next week.  #6 history of chronic kidney disease this actually appears to be stable as noted above will update a metabolic panel if okay with patient and family in palliative care.  #7 anemia-thought to be chronic disease this appears to be stable as well with most recent hemoglobin back in January 2017 of 11.9 will update this as well pending approval.  Number a history of dementia with depression this appears to be quite stable he is on Lexapro-of note he is also on Remeron although I believe this is more for appetite stimulation-nonetheless this appears to have stabilized significantly.  [id number] note greater than 35 minutes spent assessing patient-reviewing his chart-reviewing his labs-discussing his status with nursing staff as well as with patient  in his room-and coordinating and formulating a plan of care  for numerous diagnoses-of note greater than 50% of time spent coordinating plan of care     London Sheer, [place] [phone]

## 2016-04-03 DIAGNOSIS — I959 Hypotension, unspecified: Secondary | ICD-10-CM | POA: Diagnosis not present

## 2016-04-03 DIAGNOSIS — R627 Adult failure to thrive: Secondary | ICD-10-CM | POA: Diagnosis not present

## 2016-04-03 DIAGNOSIS — I129 Hypertensive chronic kidney disease with stage 1 through stage 4 chronic kidney disease, or unspecified chronic kidney disease: Secondary | ICD-10-CM | POA: Diagnosis not present

## 2016-04-03 DIAGNOSIS — N183 Chronic kidney disease, stage 3 (moderate): Secondary | ICD-10-CM | POA: Diagnosis not present

## 2016-04-06 LAB — BASIC METABOLIC PANEL
BUN: 16 mg/dL (ref 4–21)
CREATININE: 1 mg/dL (ref <=1.3)
GLUCOSE: 142 mg/dL
Potassium: 4.4 mmol/L (ref 3.4–5.3)
SODIUM: 144 mmol/L (ref 137–147)

## 2016-04-06 LAB — CBC AND DIFFERENTIAL
HEMATOCRIT: 43 % (ref 41–53)
HEMOGLOBIN: 13.5 g/dL (ref 13.5–17.5)
PLATELETS: 181 10*3/uL (ref 150–399)
WBC: 4.5 10^3/mL

## 2016-04-22 DIAGNOSIS — R339 Retention of urine, unspecified: Secondary | ICD-10-CM | POA: Diagnosis not present

## 2016-05-13 ENCOUNTER — Non-Acute Institutional Stay (SKILLED_NURSING_FACILITY): Payer: Medicare Other | Admitting: Internal Medicine

## 2016-05-13 ENCOUNTER — Encounter: Payer: Self-pay | Admitting: Internal Medicine

## 2016-05-13 DIAGNOSIS — I4891 Unspecified atrial fibrillation: Secondary | ICD-10-CM

## 2016-05-13 DIAGNOSIS — E785 Hyperlipidemia, unspecified: Secondary | ICD-10-CM | POA: Diagnosis not present

## 2016-05-13 DIAGNOSIS — N319 Neuromuscular dysfunction of bladder, unspecified: Secondary | ICD-10-CM | POA: Diagnosis not present

## 2016-05-13 NOTE — Progress Notes (Signed)
MRN: [id number] Name: Seth [name]  Sex: male Age: 82 y.o. DOB: 04/22/1925  PSC #:  Facility/Room: [facility] / [address] Level Of Care: SNF Provider: Randon Goldsmith. Lyn Hollingshead, MD Emergency Contacts: Extended Emergency Contact Information Primary Emergency Contact: Bazen,David & Neoma Laming [place] Home Phone: [phone] Mobile Phone: [phone] Relation: Son Secondary Emergency Contact: Richardean Chimera [place] Home Phone: [phone] Relation: None  Code Status: DNR  Allergies: Ceftin [cefuroxime]  Chief Complaint  Patient presents with  . Medical Management of Chronic Issues    Routine Visit    HPI: Patient is 81 y.o. male who is being seen for routine issues of AF, HLD and neurogenic bladder.  Past Medical History:  Diagnosis Date  . A-fib (HCC)   . Aortic stenosis   . Dementia    short term memory loss  . High cholesterol   . History of TIAs   . Hyperlipidemia   . Hypertension   . Multiple falls   . Urinary incontinence     Past Surgical History:  Procedure Laterality Date  . BACK SURGERY  2006  . CERVICAL LAMINECTOMY  2002 ?  [name] CYSTOSCOPY N/A 06/26/2015   Procedure: CYSTOSCOPY;  Surgeon: Malen Gauze, MD;  Location: [facility];  Service: Urology;  Laterality: N/A;  . HERNIA REPAIR     1970s  . INSERTION OF SUPRAPUBIC CATHETER N/A 06/26/2015   Procedure: INSERTION OF SUPRAPUBIC CATHETER;  Surgeon: Malen Gauze, MD;  Location: [facility];  Service: Urology;  Laterality: N/A;      Medication List       Accurate as of 05/13/16 11:59 PM. Always use your most recent med list.          acetaminophen 500 MG tablet Commonly known as:  TYLENOL Take 500 mg by mouth every 6 (six) hours as needed for mild pain.   cetaphil cream Apply topically to both feet as needed for dry skin   docusate sodium 100 MG capsule Commonly known as:  COLACE Take 100 mg by mouth 2 (two) times daily.   ELIQUIS 2.5 MG Tabs tablet Generic drug:   apixaban Take 2.5 mg by mouth 2 (two) times daily.   ENSURE Give 1 ensure by mouth QD between meals for weight maintenance   NUTRITIONAL SUPPLEMENT PO Offer Magic Cup as bedtime snack related to weight loss   escitalopram 10 MG tablet Commonly known as:  LEXAPRO Take 10 mg by mouth daily.   metoprolol succinate 50 MG 24 hr tablet Commonly known as:  TOPROL-XL Take 50 mg by mouth every morning. Take with or immediately following a meal.   multivitamin with minerals Tabs tablet Take 1 tablet by mouth daily.   MYRBETRIQ 25 MG Tb24 tablet Generic drug:  mirabegron ER Take 25 mg by mouth daily.   predniSONE 10 MG tablet Commonly known as:  DELTASONE Take 1 tablet (10 mg total) by mouth daily with breakfast.   pyridostigmine 60 MG tablet Commonly known as:  MESTINON Take 60 mg by mouth 3 (three) times daily.   senna 8.6 MG tablet Commonly known as:  SENOKOT Take 1 tablet by mouth 2 (two) times daily.   simvastatin 10 MG tablet Commonly known as:  ZOCOR Take 10 mg by mouth daily.   Vitamin D3 2000 units Tabs Take 2,000 Units by mouth daily.       Meds ordered this encounter  Medications  . senna (SENOKOT) 8.6 MG tablet    Sig: Take  1 tablet by mouth 2 (two) times daily.  [name] pyridostigmine (MESTINON) 60 MG tablet    Sig: Take 60 mg by mouth 3 (three) times daily.  . Nutritional Supplements (NUTRITIONAL SUPPLEMENT PO)    Sig: Offer Magic Cup as bedtime snack related to weight loss    Immunization History  Administered Date(s) Administered  . Influenza,inj,Quad PF,36+ Mos 07/05/2015  . PPD Test 11/09/2015  . Pneumococcal Polysaccharide-23 07/05/2015  . Tdap 03/31/2015    Social History  Substance Use Topics  . Smoking status: Former Games developer  . Smokeless tobacco: Never Used  . Alcohol use No    Review of Systems  DATA OBTAINED: from patient, cannot fully participate, and nurse GENERAL:  no fevers, fatigue, appetite changes SKIN: No itching, rash HEENT:  No complaint RESPIRATORY: No cough, wheezing, SOB CARDIAC: No chest pain, palpitations, lower extremity edema  GI: No abdominal pain, No N/V/D or constipation, No heartburn or reflux  GU: No dysuria, frequency or urgency, or incontinence  MUSCULOSKELETAL: No unrelieved bone/joint pain NEUROLOGIC: No headache, dizziness  PSYCHIATRIC: No overt anxiety or sadness  Vitals:   05/13/16 1018  BP: 130/67  Pulse: 74  Resp: 18  Temp: 97.3 F (36.3 C)    Physical Exam  GENERAL APPEARANCE: Alert, mod conversant, No acute distress  SKIN: No diaphoresis rash HEENT: Unremarkable RESPIRATORY: Breathing is even, unlabored. Lung sounds are clear   CARDIOVASCULAR: Heart RRR no murmurs, rubs or gallops. No peripheral edema  GASTROINTESTINAL: Abdomen is soft, non-tender, not distended w/ normal bowel sounds.  GENITOURINARY: Bladder non tender, not distended;catheter MUSCULOSKELETAL: No abnormal joints or musculature NEUROLOGIC: Cranial nerves 2-12 grossly intact. Moves all extremities PSYCHIATRIC: Mood and affect appropriate to situation with dementia, no behavioral issues  Patient Active Problem List   Diagnosis Date Noted  . Encounter for family conference with patient present 12/14/2015  . Frequent falls 11/05/2015  . Candida UTI 10/01/2015  . FTT (failure to thrive) in adult 09/25/2015  . Neurogenic bladder 09/25/2015  . Inflammatory arthritis (HCC) 08/21/2015  . 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 Bacteremia due to Klebsiella pneumoniae 08/07/2015  . Sepsis due to Klebsiella pneumoniae (HCC) 08/07/2015  . DNR (do not resuscitate) 08/06/2015  . Septic shock (HCC) 08/03/2015  . Hypotension 08/03/2015  . Leukocytosis 08/03/2015  . Catheter-associated urinary tract infection (HCC) 08/03/2015  . Palliative care encounter   . Acute renal failure superimposed on stage 3 chronic kidney disease (HCC) 07/13/2015  . Myasthenia gravis (HCC) 07/13/2015  . Acute encephalopathy 07/04/2015  .  Fecal impaction (HCC) 07/04/2015  . Dementia 07/04/2015  . Pressure ulcer 07/04/2015  . AP (abdominal pain)   . Hypoxia   . Altered mental status   . Consciousness loss of 05/29/2015  . Vertigo, central origin 05/29/2015  . Dizziness and giddiness 05/29/2015  . Weakness 05/29/2015  . Cerebral brain hemorrhage (HCC) 05/29/2015  . TIA (transient ischemic attack) 05/29/2015  . Proximal limb muscle weakness 05/29/2015  . Ataxia 05/29/2015  . Syncope 03/31/2015  . CKD (chronic kidney disease), stage III 03/31/2015  . HTN (hypertension) 03/31/2015  . AF (atrial fibrillation) (HCC) 03/31/2015  . Dyslipidemia 03/31/2015    CBC    Component Value Date/Time   WBC 4.5 04/06/2016   WBC 7.9 11/09/2015 0710   RBC 4.24 11/09/2015 0710   HGB 13.5 04/06/2016   HCT 43 04/06/2016   PLT 181 04/06/2016   MCV 87.5 11/09/2015 0710   LYMPHSABS 0.6 (L) 11/05/2015 1258   MONOABS 2.6 (H) 11/05/2015 1258  EOSABS 0.0 11/05/2015 1258   BASOSABS 0.0 11/05/2015 1258    CMP     Component Value Date/Time   NA 144 04/06/2016   K 4.4 04/06/2016   CL 113 (H) 11/09/2015 0710   CO2 24 11/09/2015 0710   GLUCOSE 106 (H) 11/09/2015 0710   BUN 16 04/06/2016   CREATININE 1.0 04/06/2016   CREATININE 0.76 11/09/2015 0710   CALCIUM 8.4 (L) 11/09/2015 0710   PROT 5.1 (L) 11/06/2015 0330   PROT 7.7 05/29/2015 1319   ALBUMIN 2.5 (L) 11/06/2015 0330   ALBUMIN 4.0 05/29/2015 1319   AST 26 11/06/2015 0330   ALT 22 11/06/2015 0330   ALKPHOS 64 11/06/2015 0330   BILITOT 0.8 11/06/2015 0330   BILITOT 1.2 05/29/2015 1319   GFRNONAA >60 11/09/2015 0710   GFRAA >60 11/09/2015 0710    Assessment and Plan  AF (atrial fibrillation) (HCC) Prophylaxed with eliquis 2.5 mg BID and rate controlled with metoprolol with no reported problems; plan to cont current meds  Dyslipidemia Well controlled on Zocor 10 mg; last HDL 48, LDL 65 in 01/2016; eill monitor at intervals  Neurogenic bladder Pt with chronic  suprapubic catheter; h/o frequent UTI's so low threshold to order U/A; pt on mybetric 24 hr 25 mg daily for spasms   Randon Goldsmith. Lyn Hollingshead, MD

## 2016-05-19 DIAGNOSIS — M6281 Muscle weakness (generalized): Secondary | ICD-10-CM | POA: Diagnosis not present

## 2016-05-19 DIAGNOSIS — G7000 Myasthenia gravis without (acute) exacerbation: Secondary | ICD-10-CM | POA: Diagnosis not present

## 2016-05-20 DIAGNOSIS — R339 Retention of urine, unspecified: Secondary | ICD-10-CM | POA: Diagnosis not present

## 2016-06-06 DIAGNOSIS — N390 Urinary tract infection, site not specified: Secondary | ICD-10-CM | POA: Diagnosis not present

## 2016-06-10 ENCOUNTER — Non-Acute Institutional Stay (SKILLED_NURSING_FACILITY): Payer: Medicare Other | Admitting: Internal Medicine

## 2016-06-10 ENCOUNTER — Encounter: Payer: Self-pay | Admitting: Internal Medicine

## 2016-06-10 DIAGNOSIS — N183 Chronic kidney disease, stage 3 unspecified: Secondary | ICD-10-CM

## 2016-06-10 DIAGNOSIS — R8290 Unspecified abnormal findings in urine: Secondary | ICD-10-CM

## 2016-06-10 DIAGNOSIS — F0390 Unspecified dementia without behavioral disturbance: Secondary | ICD-10-CM

## 2016-06-10 DIAGNOSIS — G7000 Myasthenia gravis without (acute) exacerbation: Secondary | ICD-10-CM

## 2016-06-10 NOTE — Assessment & Plan Note (Signed)
Prophylaxed with eliquis 2.5 mg BID and rate controlled with metoprolol with no reported problems; plan to cont current meds

## 2016-06-10 NOTE — Assessment & Plan Note (Signed)
Pt is still not afraid to die and is ready to go but pt has been doing very well;his dementia is mild to moderate and is not on dementia specific meds, only lexapro for depression

## 2016-06-10 NOTE — Progress Notes (Signed)
MRN: [id number] Name: Seth [name]  Sex: male Age: 82 y.o. DOB: 1925/03/21  PSC #:  Facility/Room: [facility] / [address] Level Of Care: SNF Provider: Randon GoldsmithAnne D. Lyn HollingsheadAlexander, MD Emergency Contacts: Extended Emergency Contact Information Primary Emergency Contact: Nebergall,David & Neoma LamingEllen  [place] Home Phone: [phone] Mobile Phone: [phone] Relation: Son Secondary Emergency Contact: Richardean ChimeraSmoak,Mark  [place] Home Phone: [phone] Relation: None  Code Status: DNR  Allergies: Ceftin [cefuroxime]  Chief Complaint  Patient presents with  . Medical Management of Chronic Issues    Routine Visit    HPI: Patient is 82 y.o. male who is being seen today for routine issues of CKD3, myasthenia gravis and dementia.pt is seen with one of his many family members who is reading to him in his room.  Past Medical History:  Diagnosis Date  . A-fib (HCC)   . Aortic stenosis   . Dementia    short term memory loss  . High cholesterol   . History of TIAs   . Hyperlipidemia   . Hypertension   . Multiple falls   . Urinary incontinence     Past Surgical History:  Procedure Laterality Date  . BACK SURGERY  2006  . CERVICAL LAMINECTOMY  2002 ?  [name]. CYSTOSCOPY N/A 06/26/2015   Procedure: CYSTOSCOPY;  Surgeon: Malen GauzePatrick L McKenzie, MD;  Location: [facility];  Service: Urology;  Laterality: N/A;  . HERNIA REPAIR     1970s  . INSERTION OF SUPRAPUBIC CATHETER N/A 06/26/2015   Procedure: INSERTION OF SUPRAPUBIC CATHETER;  Surgeon: Malen GauzePatrick L McKenzie, MD;  Location: [facility];  Service: Urology;  Laterality: N/A;      Medication List       Accurate as of 06/10/16  7:07 PM. Always use your most recent med list.          acetaminophen 500 MG tablet Commonly known as:  TYLENOL Take 500 mg by mouth every 6 (six) hours as needed for mild pain.   cetaphil cream Apply topically to both feet as needed for dry skin   docusate sodium 100 MG capsule Commonly known as:   COLACE Take 100 mg by mouth 2 (two) times daily.   ELIQUIS 2.5 MG Tabs tablet Generic drug:  apixaban Take 2.5 mg by mouth 2 (two) times daily.   escitalopram 10 MG tablet Commonly known as:  LEXAPRO Take 10 mg by mouth daily.   metoprolol succinate 50 MG 24 hr tablet Commonly known as:  TOPROL-XL Take 50 mg by mouth every morning. Take with or immediately following a meal.   multivitamin with minerals Tabs tablet Take 1 tablet by mouth daily.   MYRBETRIQ 25 MG Tb24 tablet Generic drug:  mirabegron ER Take 25 mg by mouth daily.   NUTRITIONAL SUPPLEMENT PO Offer Magic Cup as bedtime snack related to weight loss   predniSONE 10 MG tablet Commonly known as:  DELTASONE Take 1 tablet (10 mg total) by mouth daily with breakfast.   pyridostigmine 60 MG tablet Commonly known as:  MESTINON Take 60 mg by mouth 3 (three) times daily.   senna 8.6 MG tablet Commonly known as:  SENOKOT Take 1 tablet by mouth 2 (two) times daily.   simvastatin 10 MG tablet Commonly known as:  ZOCOR Take 10 mg by mouth daily.   Vitamin D3 2000 units Tabs Take 2,000 Units by mouth daily.       No orders of the defined types were placed in this encounter.  Immunization History  Administered Date(s) Administered  . Influenza,inj,Quad PF,36+ Mos 07/05/2015  . PPD Test 11/09/2015  . Pneumococcal Polysaccharide-23 07/05/2015  . Tdap 03/31/2015    Social History  Substance Use Topics  . Smoking status: Former Games developer  . Smokeless tobacco: Never Used  . Alcohol use No    Review of Systems  DATA OBTAINED: from patient, family member GENERAL:  no fevers, fatigue, appetite changes SKIN: No itching, rash HEENT: No complaint RESPIRATORY: No cough, wheezing, SOB CARDIAC: No chest pain, palpitations, lower extremity edema  GI: No abdominal pain, No N/V/D or constipation, No heartburn or reflux  GU: No dysuria, frequency or urgency, or incontinence; family member had thought they had seen  blood in urine or infection several days ag  MUSCULOSKELETAL: No unrelieved bone/joint pain NEUROLOGIC: No headache, dizziness  PSYCHIATRIC: No overt anxiety or sadness  Vitals:   06/10/16 0829  BP: 137/77  Pulse: 69  Resp: 18  Temp: 98 F (36.7 C)    Physical Exam  GENERAL APPEARANCE: Alert, conversant, No acute distress  SKIN: No diaphoresis rash HEENT: Unremarkable RESPIRATORY: Breathing is even, unlabored. Lung sounds are clear   CARDIOVASCULAR: Heart RRR no murmurs, rubs or gallops. trace peripheral edema  GASTROINTESTINAL: Abdomen is soft, non-tender, not distended w/ normal bowel sounds.  GENITOURINARY: Bladder non tender, not distended; catheter  MUSCULOSKELETAL: No abnormal joints or musculature NEUROLOGIC: Cranial nerves 2-12 grossly intact. Moves all extremities PSYCHIATRIC: Mood and affect appropriate to situation with dementia, no behavioral issues  Patient Active Problem List   Diagnosis Date Noted  . Encounter for family conference with patient present 12/14/2015  . Frequent falls 11/05/2015  . Candida UTI 10/01/2015  . FTT (failure to thrive) in adult 09/25/2015  . Neurogenic bladder 09/25/2015  . Inflammatory arthritis (HCC) 08/21/2015  . 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 Bacteremia due to Klebsiella pneumoniae 08/07/2015  . Sepsis due to Klebsiella pneumoniae (HCC) 08/07/2015  . DNR (do not resuscitate) 08/06/2015  . Septic shock (HCC) 08/03/2015  . Hypotension 08/03/2015  . Leukocytosis 08/03/2015  . Catheter-associated urinary tract infection (HCC) 08/03/2015  . Palliative care encounter   . Acute renal failure superimposed on stage 3 chronic kidney disease (HCC) 07/13/2015  . Myasthenia gravis (HCC) 07/13/2015  . Acute encephalopathy 07/04/2015  . Fecal impaction (HCC) 07/04/2015  . Dementia 07/04/2015  . Pressure ulcer 07/04/2015  . AP (abdominal pain)   . Hypoxia   . Altered mental status   . Consciousness loss of 05/29/2015  . Vertigo,  central origin 05/29/2015  . Dizziness and giddiness 05/29/2015  . Weakness 05/29/2015  . Cerebral brain hemorrhage (HCC) 05/29/2015  . TIA (transient ischemic attack) 05/29/2015  . Proximal limb muscle weakness 05/29/2015  . Ataxia 05/29/2015  . Syncope 03/31/2015  . CKD (chronic kidney disease), stage III 03/31/2015  . HTN (hypertension) 03/31/2015  . AF (atrial fibrillation) (HCC) 03/31/2015  . Dyslipidemia 03/31/2015    CBC    Component Value Date/Time   WBC 4.5 04/06/2016   WBC 7.9 11/09/2015 0710   RBC 4.24 11/09/2015 0710   HGB 13.5 04/06/2016   HCT 43 04/06/2016   PLT 181 04/06/2016   MCV 87.5 11/09/2015 0710   LYMPHSABS 0.6 (L) 11/05/2015 1258   MONOABS 2.6 (H) 11/05/2015 1258   EOSABS 0.0 11/05/2015 1258   BASOSABS 0.0 11/05/2015 1258    CMP     Component Value Date/Time   NA 144 04/06/2016   K 4.4 04/06/2016   CL 113 (H) 11/09/2015  0710   CO2 24 11/09/2015 0710   GLUCOSE 106 (H) 11/09/2015 0710   BUN 16 04/06/2016   CREATININE 1.0 04/06/2016   CREATININE 0.76 11/09/2015 0710   CALCIUM 8.4 (L) 11/09/2015 0710   PROT 5.1 (L) 11/06/2015 0330   PROT 7.7 05/29/2015 1319   ALBUMIN 2.5 (L) 11/06/2015 0330   ALBUMIN 4.0 05/29/2015 1319   AST 26 11/06/2015 0330   ALT 22 11/06/2015 0330   ALKPHOS 64 11/06/2015 0330   BILITOT 0.8 11/06/2015 0330   BILITOT 1.2 05/29/2015 1319   GFRNONAA >60 11/09/2015 0710   GFRAA >60 11/09/2015 0710    Assessment and Plan  CKD (chronic kidney disease), stage III No recent GFR bit in 03/2016 BUN/Cr was 16/1.0  Which is baseline and very stable  Myasthenia gravis (HCC) Pt continues without sx;plan - cont mestinon 30 mg TID  Dementia Pt is still not afraid to die and is ready to go but pt has been doing very well;his dementia is mild to moderate and is not on dementia specific meds, only lexapro for depression  URINE ABNORMALITY -  U/A was sent and no blood and mixed flora for final  Thurston Holenne D. Lyn HollingsheadAlexander, MD

## 2016-06-10 NOTE — Assessment & Plan Note (Signed)
Pt with chronic suprapubic catheter; h/o frequent UTI's so low threshold to order U/A; pt on mybetric 24 hr 25 mg daily for spasms

## 2016-06-10 NOTE — Assessment & Plan Note (Signed)
Well controlled on Zocor 10 mg; last HDL 48, LDL 65 in 01/2016; eill monitor at intervals

## 2016-06-10 NOTE — Assessment & Plan Note (Signed)
No recent GFR bit in 03/2016 BUN/Cr was 16/1.0  Which is baseline and very stable

## 2016-06-10 NOTE — Assessment & Plan Note (Signed)
Pt continues without sx;plan - cont mestinon 30 mg TID

## 2016-06-24 DIAGNOSIS — R339 Retention of urine, unspecified: Secondary | ICD-10-CM | POA: Diagnosis not present

## 2016-07-02 DIAGNOSIS — F329 Major depressive disorder, single episode, unspecified: Secondary | ICD-10-CM | POA: Diagnosis not present

## 2016-07-02 DIAGNOSIS — F0390 Unspecified dementia without behavioral disturbance: Secondary | ICD-10-CM | POA: Diagnosis not present

## 2016-07-08 DIAGNOSIS — R627 Adult failure to thrive: Secondary | ICD-10-CM | POA: Diagnosis not present

## 2016-07-09 ENCOUNTER — Non-Acute Institutional Stay (SKILLED_NURSING_FACILITY): Payer: Medicare Other | Admitting: Internal Medicine

## 2016-07-09 ENCOUNTER — Encounter: Payer: Self-pay | Admitting: Internal Medicine

## 2016-07-09 DIAGNOSIS — I10 Essential (primary) hypertension: Secondary | ICD-10-CM

## 2016-07-09 DIAGNOSIS — R627 Adult failure to thrive: Secondary | ICD-10-CM

## 2016-07-09 DIAGNOSIS — E559 Vitamin D deficiency, unspecified: Secondary | ICD-10-CM | POA: Diagnosis not present

## 2016-07-09 NOTE — Progress Notes (Signed)
MRN: [id number] Name: Seth [name]  Sex: male Age: 82 y.o. DOB: May 11, 1925  PSC #:  Facility/Room: [facility] / [address] Level Of Care: SNF Provider: Randon GoldsmithAnne D. Lyn HollingsheadAlexander, MD Emergency Contacts: Extended Emergency Contact Information Primary Emergency Contact: Blitch,David & Neoma LamingEllen  [place] Home Phone: [phone] Mobile Phone: [phone] Relation: Son Secondary Emergency Contact: Richardean ChimeraSmoak,Mark  [place] Home Phone: [phone] Relation: None  Code Status: DNR  Allergies: Ceftin [cefuroxime]  Chief Complaint  Patient presents with  . Medical Management of Chronic Issues    Routine Visit    HPI: Patient is 82 y.o. male who is being seen for routine issues of HTN, FTT and vit D deficiency.  Past Medical History:  Diagnosis Date  . A-fib (HCC)   . Aortic stenosis   . Dementia    short term memory loss  . High cholesterol   . History of TIAs   . Hyperlipidemia   . Hypertension   . Multiple falls   . Urinary incontinence     Past Surgical History:  Procedure Laterality Date  . BACK SURGERY  2006  . CERVICAL LAMINECTOMY  2002 ?  [name]. CYSTOSCOPY N/A 06/26/2015   Procedure: CYSTOSCOPY;  Surgeon: Malen GauzePatrick L McKenzie, MD;  Location: [facility];  Service: Urology;  Laterality: N/A;  . HERNIA REPAIR     1970s  . INSERTION OF SUPRAPUBIC CATHETER N/A 06/26/2015   Procedure: INSERTION OF SUPRAPUBIC CATHETER;  Surgeon: Malen GauzePatrick L McKenzie, MD;  Location: [facility];  Service: Urology;  Laterality: N/A;      Medication List       Accurate as of 07/09/16 11:59 PM. Always use your most recent med list.          acetaminophen 500 MG tablet Commonly known as:  TYLENOL Take 500 mg by mouth every 6 (six) hours as needed for mild pain.   cetaphil cream Apply topically to both feet as needed for dry skin   docusate sodium 100 MG capsule Commonly known as:  COLACE Take 100 mg by mouth 2 (two) times daily.   ELIQUIS 2.5 MG Tabs tablet Generic drug:   apixaban Take 2.5 mg by mouth 2 (two) times daily.   escitalopram 10 MG tablet Commonly known as:  LEXAPRO Take 10 mg by mouth daily.   metoprolol succinate 50 MG 24 hr tablet Commonly known as:  TOPROL-XL Take 50 mg by mouth every morning. Take with or immediately following a meal.   multivitamin with minerals Tabs tablet Take 1 tablet by mouth daily.   MYRBETRIQ 25 MG Tb24 tablet Generic drug:  mirabegron ER Take 25 mg by mouth daily.   NUTRITIONAL SUPPLEMENT PO Offer Magic Cup as bedtime snack related to weight loss   ENSURE Take 237 mLs by mouth 3 (three) times daily between meals.   predniSONE 10 MG tablet Commonly known as:  DELTASONE Take 1 tablet (10 mg total) by mouth daily with breakfast.   pyridostigmine 60 MG tablet Commonly known as:  MESTINON Take 60 mg by mouth 3 (three) times daily.   senna 8.6 MG tablet Commonly known as:  SENOKOT Take 1 tablet by mouth 2 (two) times daily.   simvastatin 10 MG tablet Commonly known as:  ZOCOR Take 10 mg by mouth daily.   Vitamin D3 2000 units Tabs Take 2,000 Units by mouth daily.       No orders of the defined types were placed in this encounter.   Immunization History  Administered  Date(s) Administered  . Influenza,inj,Quad PF,36+ Mos 07/05/2015  . PPD Test 11/09/2015  . Pneumococcal Polysaccharide-23 07/05/2015  . Tdap 03/31/2015    Social History  Substance Use Topics  . Smoking status: Former Games developer  . Smokeless tobacco: Never Used  . Alcohol use No    Review of Systems  DATA OBTAINED: from patient, nurse GENERAL:  no fevers, fatigue, appetite changes SKIN: No itching, rash HEENT: No complaint RESPIRATORY: No cough, wheezing, SOB CARDIAC: No chest pain, palpitations, lower extremity edema  GI: No abdominal pain, No N/V/D or constipation, No heartburn or reflux  GU: No dysuria, frequency or urgency, or incontinence  MUSCULOSKELETAL: No unrelieved bone/joint pain NEUROLOGIC: No headache,  dizziness  PSYCHIATRIC: No overt anxiety or sadness  Vitals:   07/09/16 0930  BP: 135/77  Pulse: (!) 58  Resp: 17  Temp: 98 F (36.7 C)    Physical Exam  GENERAL APPEARANCE: Alert, conversant, No acute distress  SKIN: No diaphoresis rash HEENT: Unremarkable RESPIRATORY: Breathing is even, unlabored. Lung sounds are clear   CARDIOVASCULAR: Heart RRR no murmurs, rubs or gallops. No peripheral edema  GASTROINTESTINAL: Abdomen is soft, non-tender, not distended w/ normal bowel sounds.  GENITOURINARY: Bladder non tender, not distended  MUSCULOSKELETAL: No abnormal joints or musculature NEUROLOGIC: Cranial nerves 2-12 grossly intact. Moves all extremities PSYCHIATRIC: Mood and affect appropriate to situation with mild dementia, no behavioral issues  Patient Active Problem List   Diagnosis Date Noted  . Vitamin D deficiency 07/11/2016  . Encounter for family conference with patient present 12/14/2015  . Frequent falls 11/05/2015  . Candida UTI 10/01/2015  . FTT (failure to thrive) in adult 09/25/2015  . Neurogenic bladder 09/25/2015  . Inflammatory arthritis (HCC) 08/21/2015  . 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 Bacteremia due to Klebsiella pneumoniae 08/07/2015  . Sepsis due to Klebsiella pneumoniae (HCC) 08/07/2015  . DNR (do not resuscitate) 08/06/2015  . Septic shock (HCC) 08/03/2015  . Hypotension 08/03/2015  . Leukocytosis 08/03/2015  . Catheter-associated urinary tract infection (HCC) 08/03/2015  . Palliative care encounter   . Acute renal failure superimposed on stage 3 chronic kidney disease (HCC) 07/13/2015  . Myasthenia gravis (HCC) 07/13/2015  . Acute encephalopathy 07/04/2015  . Fecal impaction (HCC) 07/04/2015  . Dementia 07/04/2015  . Pressure ulcer 07/04/2015  . AP (abdominal pain)   . Hypoxia   . Altered mental status   . Consciousness loss of 05/29/2015  . Vertigo, central origin 05/29/2015  . Dizziness and giddiness 05/29/2015  . Weakness  05/29/2015  . Cerebral brain hemorrhage (HCC) 05/29/2015  . TIA (transient ischemic attack) 05/29/2015  . Proximal limb muscle weakness 05/29/2015  . Ataxia 05/29/2015  . Syncope 03/31/2015  . CKD (chronic kidney disease), stage III 03/31/2015  . HTN (hypertension) 03/31/2015  . AF (atrial fibrillation) (HCC) 03/31/2015  . Dyslipidemia 03/31/2015    CBC    Component Value Date/Time   WBC 4.5 04/06/2016   WBC 7.9 11/09/2015 0710   RBC 4.24 11/09/2015 0710   HGB 13.5 04/06/2016   HCT 43 04/06/2016   PLT 181 04/06/2016   MCV 87.5 11/09/2015 0710   LYMPHSABS 0.6 (L) 11/05/2015 1258   MONOABS 2.6 (H) 11/05/2015 1258   EOSABS 0.0 11/05/2015 1258   BASOSABS 0.0 11/05/2015 1258    CMP     Component Value Date/Time   NA 144 04/06/2016   K 4.4 04/06/2016   CL 113 (H) 11/09/2015 0710   CO2 24 11/09/2015 0710   GLUCOSE 106 (H) 11/09/2015  0710   BUN 16 04/06/2016   CREATININE 1.0 04/06/2016   CREATININE 0.76 11/09/2015 0710   CALCIUM 8.4 (L) 11/09/2015 0710   PROT 5.1 (L) 11/06/2015 0330   PROT 7.7 05/29/2015 1319   ALBUMIN 2.5 (L) 11/06/2015 0330   ALBUMIN 4.0 05/29/2015 1319   AST 26 11/06/2015 0330   ALT 22 11/06/2015 0330   ALKPHOS 64 11/06/2015 0330   BILITOT 0.8 11/06/2015 0330   BILITOT 1.2 05/29/2015 1319   GFRNONAA >60 11/09/2015 0710   GFRAA >60 11/09/2015 0710    Assessment and Plan  HTN (hypertension) Controlled; cont toprol XL 50 mg daily  FTT (failure to thrive) in adult Pt is continuing to NOT FTT; pt is doing well, walks with restorative, visits with family  Vitamin D deficiency Will cont 2000u daily of replacement while a Vit D level is obtained   Thurston Hole D. Lyn Hollingshead, MD

## 2016-07-10 DIAGNOSIS — R262 Difficulty in walking, not elsewhere classified: Secondary | ICD-10-CM | POA: Diagnosis not present

## 2016-07-10 DIAGNOSIS — R488 Other symbolic dysfunctions: Secondary | ICD-10-CM | POA: Diagnosis not present

## 2016-07-10 DIAGNOSIS — R2681 Unsteadiness on feet: Secondary | ICD-10-CM | POA: Diagnosis not present

## 2016-07-10 DIAGNOSIS — G7000 Myasthenia gravis without (acute) exacerbation: Secondary | ICD-10-CM | POA: Diagnosis not present

## 2016-07-11 DIAGNOSIS — R2681 Unsteadiness on feet: Secondary | ICD-10-CM | POA: Diagnosis not present

## 2016-07-11 DIAGNOSIS — R262 Difficulty in walking, not elsewhere classified: Secondary | ICD-10-CM | POA: Diagnosis not present

## 2016-07-11 DIAGNOSIS — G7000 Myasthenia gravis without (acute) exacerbation: Secondary | ICD-10-CM | POA: Diagnosis not present

## 2016-07-11 DIAGNOSIS — R488 Other symbolic dysfunctions: Secondary | ICD-10-CM | POA: Diagnosis not present

## 2016-07-11 DIAGNOSIS — E559 Vitamin D deficiency, unspecified: Secondary | ICD-10-CM | POA: Insufficient documentation

## 2016-07-11 NOTE — Assessment & Plan Note (Signed)
Pt is continuing to NOT FTT; pt is doing well, walks with restorative, visits with family

## 2016-07-11 NOTE — Assessment & Plan Note (Signed)
Will cont 2000u daily of replacement while a Vit D level is obtained

## 2016-07-11 NOTE — Assessment & Plan Note (Signed)
Controlled; cont toprol XL 50 mg daily

## 2016-07-13 DIAGNOSIS — R2681 Unsteadiness on feet: Secondary | ICD-10-CM | POA: Diagnosis not present

## 2016-07-13 DIAGNOSIS — R488 Other symbolic dysfunctions: Secondary | ICD-10-CM | POA: Diagnosis not present

## 2016-07-13 DIAGNOSIS — G7000 Myasthenia gravis without (acute) exacerbation: Secondary | ICD-10-CM | POA: Diagnosis not present

## 2016-07-13 DIAGNOSIS — R262 Difficulty in walking, not elsewhere classified: Secondary | ICD-10-CM | POA: Diagnosis not present

## 2016-07-14 DIAGNOSIS — R2681 Unsteadiness on feet: Secondary | ICD-10-CM | POA: Diagnosis not present

## 2016-07-14 DIAGNOSIS — R488 Other symbolic dysfunctions: Secondary | ICD-10-CM | POA: Diagnosis not present

## 2016-07-14 DIAGNOSIS — Z9181 History of falling: Secondary | ICD-10-CM | POA: Diagnosis not present

## 2016-07-14 DIAGNOSIS — R262 Difficulty in walking, not elsewhere classified: Secondary | ICD-10-CM | POA: Diagnosis not present

## 2016-07-14 DIAGNOSIS — E559 Vitamin D deficiency, unspecified: Secondary | ICD-10-CM | POA: Diagnosis not present

## 2016-07-14 DIAGNOSIS — G7000 Myasthenia gravis without (acute) exacerbation: Secondary | ICD-10-CM | POA: Diagnosis not present

## 2016-07-15 DIAGNOSIS — R488 Other symbolic dysfunctions: Secondary | ICD-10-CM | POA: Diagnosis not present

## 2016-07-15 DIAGNOSIS — G7000 Myasthenia gravis without (acute) exacerbation: Secondary | ICD-10-CM | POA: Diagnosis not present

## 2016-07-15 DIAGNOSIS — R262 Difficulty in walking, not elsewhere classified: Secondary | ICD-10-CM | POA: Diagnosis not present

## 2016-07-15 DIAGNOSIS — R2681 Unsteadiness on feet: Secondary | ICD-10-CM | POA: Diagnosis not present

## 2016-07-16 DIAGNOSIS — R2681 Unsteadiness on feet: Secondary | ICD-10-CM | POA: Diagnosis not present

## 2016-07-16 DIAGNOSIS — R262 Difficulty in walking, not elsewhere classified: Secondary | ICD-10-CM | POA: Diagnosis not present

## 2016-07-16 DIAGNOSIS — R488 Other symbolic dysfunctions: Secondary | ICD-10-CM | POA: Diagnosis not present

## 2016-07-16 DIAGNOSIS — G7000 Myasthenia gravis without (acute) exacerbation: Secondary | ICD-10-CM | POA: Diagnosis not present

## 2016-07-17 DIAGNOSIS — R2681 Unsteadiness on feet: Secondary | ICD-10-CM | POA: Diagnosis not present

## 2016-07-17 DIAGNOSIS — R262 Difficulty in walking, not elsewhere classified: Secondary | ICD-10-CM | POA: Diagnosis not present

## 2016-07-17 DIAGNOSIS — R488 Other symbolic dysfunctions: Secondary | ICD-10-CM | POA: Diagnosis not present

## 2016-07-17 DIAGNOSIS — Z23 Encounter for immunization: Secondary | ICD-10-CM | POA: Diagnosis not present

## 2016-07-17 DIAGNOSIS — G7000 Myasthenia gravis without (acute) exacerbation: Secondary | ICD-10-CM | POA: Diagnosis not present

## 2016-07-18 DIAGNOSIS — G7000 Myasthenia gravis without (acute) exacerbation: Secondary | ICD-10-CM | POA: Diagnosis not present

## 2016-07-18 DIAGNOSIS — R2681 Unsteadiness on feet: Secondary | ICD-10-CM | POA: Diagnosis not present

## 2016-07-18 DIAGNOSIS — R262 Difficulty in walking, not elsewhere classified: Secondary | ICD-10-CM | POA: Diagnosis not present

## 2016-07-18 DIAGNOSIS — R488 Other symbolic dysfunctions: Secondary | ICD-10-CM | POA: Diagnosis not present

## 2016-07-19 DIAGNOSIS — G7000 Myasthenia gravis without (acute) exacerbation: Secondary | ICD-10-CM | POA: Diagnosis not present

## 2016-07-19 DIAGNOSIS — R2681 Unsteadiness on feet: Secondary | ICD-10-CM | POA: Diagnosis not present

## 2016-07-19 DIAGNOSIS — R262 Difficulty in walking, not elsewhere classified: Secondary | ICD-10-CM | POA: Diagnosis not present

## 2016-07-19 DIAGNOSIS — R488 Other symbolic dysfunctions: Secondary | ICD-10-CM | POA: Diagnosis not present

## 2016-07-20 DIAGNOSIS — R262 Difficulty in walking, not elsewhere classified: Secondary | ICD-10-CM | POA: Diagnosis not present

## 2016-07-20 DIAGNOSIS — G7000 Myasthenia gravis without (acute) exacerbation: Secondary | ICD-10-CM | POA: Diagnosis not present

## 2016-07-20 DIAGNOSIS — R488 Other symbolic dysfunctions: Secondary | ICD-10-CM | POA: Diagnosis not present

## 2016-07-20 DIAGNOSIS — R2681 Unsteadiness on feet: Secondary | ICD-10-CM | POA: Diagnosis not present

## 2016-07-21 DIAGNOSIS — R262 Difficulty in walking, not elsewhere classified: Secondary | ICD-10-CM | POA: Diagnosis not present

## 2016-07-21 DIAGNOSIS — R488 Other symbolic dysfunctions: Secondary | ICD-10-CM | POA: Diagnosis not present

## 2016-07-21 DIAGNOSIS — G7000 Myasthenia gravis without (acute) exacerbation: Secondary | ICD-10-CM | POA: Diagnosis not present

## 2016-07-21 DIAGNOSIS — R2681 Unsteadiness on feet: Secondary | ICD-10-CM | POA: Diagnosis not present

## 2016-07-22 DIAGNOSIS — R262 Difficulty in walking, not elsewhere classified: Secondary | ICD-10-CM | POA: Diagnosis not present

## 2016-07-22 DIAGNOSIS — R2681 Unsteadiness on feet: Secondary | ICD-10-CM | POA: Diagnosis not present

## 2016-07-22 DIAGNOSIS — G7000 Myasthenia gravis without (acute) exacerbation: Secondary | ICD-10-CM | POA: Diagnosis not present

## 2016-07-22 DIAGNOSIS — R488 Other symbolic dysfunctions: Secondary | ICD-10-CM | POA: Diagnosis not present

## 2016-07-23 DIAGNOSIS — R488 Other symbolic dysfunctions: Secondary | ICD-10-CM | POA: Diagnosis not present

## 2016-07-23 DIAGNOSIS — R262 Difficulty in walking, not elsewhere classified: Secondary | ICD-10-CM | POA: Diagnosis not present

## 2016-07-23 DIAGNOSIS — R2681 Unsteadiness on feet: Secondary | ICD-10-CM | POA: Diagnosis not present

## 2016-07-23 DIAGNOSIS — G7000 Myasthenia gravis without (acute) exacerbation: Secondary | ICD-10-CM | POA: Diagnosis not present

## 2016-07-24 DIAGNOSIS — R2681 Unsteadiness on feet: Secondary | ICD-10-CM | POA: Diagnosis not present

## 2016-07-24 DIAGNOSIS — G7000 Myasthenia gravis without (acute) exacerbation: Secondary | ICD-10-CM | POA: Diagnosis not present

## 2016-07-24 DIAGNOSIS — R262 Difficulty in walking, not elsewhere classified: Secondary | ICD-10-CM | POA: Diagnosis not present

## 2016-07-24 DIAGNOSIS — R488 Other symbolic dysfunctions: Secondary | ICD-10-CM | POA: Diagnosis not present

## 2016-07-25 DIAGNOSIS — G7000 Myasthenia gravis without (acute) exacerbation: Secondary | ICD-10-CM | POA: Diagnosis not present

## 2016-07-25 DIAGNOSIS — R488 Other symbolic dysfunctions: Secondary | ICD-10-CM | POA: Diagnosis not present

## 2016-07-25 DIAGNOSIS — R262 Difficulty in walking, not elsewhere classified: Secondary | ICD-10-CM | POA: Diagnosis not present

## 2016-07-25 DIAGNOSIS — R2681 Unsteadiness on feet: Secondary | ICD-10-CM | POA: Diagnosis not present

## 2016-07-26 DIAGNOSIS — R262 Difficulty in walking, not elsewhere classified: Secondary | ICD-10-CM | POA: Diagnosis not present

## 2016-07-26 DIAGNOSIS — R488 Other symbolic dysfunctions: Secondary | ICD-10-CM | POA: Diagnosis not present

## 2016-07-26 DIAGNOSIS — R2681 Unsteadiness on feet: Secondary | ICD-10-CM | POA: Diagnosis not present

## 2016-07-26 DIAGNOSIS — G7000 Myasthenia gravis without (acute) exacerbation: Secondary | ICD-10-CM | POA: Diagnosis not present

## 2016-07-27 DIAGNOSIS — R2681 Unsteadiness on feet: Secondary | ICD-10-CM | POA: Diagnosis not present

## 2016-07-27 DIAGNOSIS — R488 Other symbolic dysfunctions: Secondary | ICD-10-CM | POA: Diagnosis not present

## 2016-07-27 DIAGNOSIS — G7000 Myasthenia gravis without (acute) exacerbation: Secondary | ICD-10-CM | POA: Diagnosis not present

## 2016-07-27 DIAGNOSIS — R262 Difficulty in walking, not elsewhere classified: Secondary | ICD-10-CM | POA: Diagnosis not present

## 2016-07-28 DIAGNOSIS — R262 Difficulty in walking, not elsewhere classified: Secondary | ICD-10-CM | POA: Diagnosis not present

## 2016-07-28 DIAGNOSIS — R488 Other symbolic dysfunctions: Secondary | ICD-10-CM | POA: Diagnosis not present

## 2016-07-28 DIAGNOSIS — G7000 Myasthenia gravis without (acute) exacerbation: Secondary | ICD-10-CM | POA: Diagnosis not present

## 2016-07-28 DIAGNOSIS — R2681 Unsteadiness on feet: Secondary | ICD-10-CM | POA: Diagnosis not present

## 2016-07-29 DIAGNOSIS — R2681 Unsteadiness on feet: Secondary | ICD-10-CM | POA: Diagnosis not present

## 2016-07-29 DIAGNOSIS — R488 Other symbolic dysfunctions: Secondary | ICD-10-CM | POA: Diagnosis not present

## 2016-07-29 DIAGNOSIS — G7000 Myasthenia gravis without (acute) exacerbation: Secondary | ICD-10-CM | POA: Diagnosis not present

## 2016-07-29 DIAGNOSIS — R262 Difficulty in walking, not elsewhere classified: Secondary | ICD-10-CM | POA: Diagnosis not present

## 2016-07-30 DIAGNOSIS — G7000 Myasthenia gravis without (acute) exacerbation: Secondary | ICD-10-CM | POA: Diagnosis not present

## 2016-07-30 DIAGNOSIS — R488 Other symbolic dysfunctions: Secondary | ICD-10-CM | POA: Diagnosis not present

## 2016-07-30 DIAGNOSIS — R338 Other retention of urine: Secondary | ICD-10-CM | POA: Diagnosis not present

## 2016-07-30 DIAGNOSIS — R2681 Unsteadiness on feet: Secondary | ICD-10-CM | POA: Diagnosis not present

## 2016-07-30 DIAGNOSIS — R262 Difficulty in walking, not elsewhere classified: Secondary | ICD-10-CM | POA: Diagnosis not present

## 2016-07-31 DIAGNOSIS — G7000 Myasthenia gravis without (acute) exacerbation: Secondary | ICD-10-CM | POA: Diagnosis not present

## 2016-07-31 DIAGNOSIS — R262 Difficulty in walking, not elsewhere classified: Secondary | ICD-10-CM | POA: Diagnosis not present

## 2016-07-31 DIAGNOSIS — R2681 Unsteadiness on feet: Secondary | ICD-10-CM | POA: Diagnosis not present

## 2016-07-31 DIAGNOSIS — R488 Other symbolic dysfunctions: Secondary | ICD-10-CM | POA: Diagnosis not present

## 2016-08-01 DIAGNOSIS — G7000 Myasthenia gravis without (acute) exacerbation: Secondary | ICD-10-CM | POA: Diagnosis not present

## 2016-08-01 DIAGNOSIS — R2681 Unsteadiness on feet: Secondary | ICD-10-CM | POA: Diagnosis not present

## 2016-08-01 DIAGNOSIS — R488 Other symbolic dysfunctions: Secondary | ICD-10-CM | POA: Diagnosis not present

## 2016-08-01 DIAGNOSIS — R262 Difficulty in walking, not elsewhere classified: Secondary | ICD-10-CM | POA: Diagnosis not present

## 2016-08-02 DIAGNOSIS — G7000 Myasthenia gravis without (acute) exacerbation: Secondary | ICD-10-CM | POA: Diagnosis not present

## 2016-08-02 DIAGNOSIS — R488 Other symbolic dysfunctions: Secondary | ICD-10-CM | POA: Diagnosis not present

## 2016-08-02 DIAGNOSIS — R2681 Unsteadiness on feet: Secondary | ICD-10-CM | POA: Diagnosis not present

## 2016-08-02 DIAGNOSIS — R262 Difficulty in walking, not elsewhere classified: Secondary | ICD-10-CM | POA: Diagnosis not present

## 2016-08-03 DIAGNOSIS — R488 Other symbolic dysfunctions: Secondary | ICD-10-CM | POA: Diagnosis not present

## 2016-08-03 DIAGNOSIS — G7000 Myasthenia gravis without (acute) exacerbation: Secondary | ICD-10-CM | POA: Diagnosis not present

## 2016-08-03 DIAGNOSIS — R2681 Unsteadiness on feet: Secondary | ICD-10-CM | POA: Diagnosis not present

## 2016-08-03 DIAGNOSIS — R262 Difficulty in walking, not elsewhere classified: Secondary | ICD-10-CM | POA: Diagnosis not present

## 2016-08-04 DIAGNOSIS — R262 Difficulty in walking, not elsewhere classified: Secondary | ICD-10-CM | POA: Diagnosis not present

## 2016-08-04 DIAGNOSIS — R2681 Unsteadiness on feet: Secondary | ICD-10-CM | POA: Diagnosis not present

## 2016-08-04 DIAGNOSIS — G7000 Myasthenia gravis without (acute) exacerbation: Secondary | ICD-10-CM | POA: Diagnosis not present

## 2016-08-04 DIAGNOSIS — R488 Other symbolic dysfunctions: Secondary | ICD-10-CM | POA: Diagnosis not present

## 2016-08-05 DIAGNOSIS — R488 Other symbolic dysfunctions: Secondary | ICD-10-CM | POA: Diagnosis not present

## 2016-08-05 DIAGNOSIS — G7000 Myasthenia gravis without (acute) exacerbation: Secondary | ICD-10-CM | POA: Diagnosis not present

## 2016-08-05 DIAGNOSIS — R262 Difficulty in walking, not elsewhere classified: Secondary | ICD-10-CM | POA: Diagnosis not present

## 2016-08-05 DIAGNOSIS — R2681 Unsteadiness on feet: Secondary | ICD-10-CM | POA: Diagnosis not present

## 2016-08-06 DIAGNOSIS — R262 Difficulty in walking, not elsewhere classified: Secondary | ICD-10-CM | POA: Diagnosis not present

## 2016-08-06 DIAGNOSIS — G7000 Myasthenia gravis without (acute) exacerbation: Secondary | ICD-10-CM | POA: Diagnosis not present

## 2016-08-06 DIAGNOSIS — R488 Other symbolic dysfunctions: Secondary | ICD-10-CM | POA: Diagnosis not present

## 2016-08-06 DIAGNOSIS — R2681 Unsteadiness on feet: Secondary | ICD-10-CM | POA: Diagnosis not present

## 2016-08-07 ENCOUNTER — Non-Acute Institutional Stay (SKILLED_NURSING_FACILITY): Payer: Medicare Other | Admitting: Internal Medicine

## 2016-08-07 ENCOUNTER — Encounter: Payer: Self-pay | Admitting: Internal Medicine

## 2016-08-07 DIAGNOSIS — R488 Other symbolic dysfunctions: Secondary | ICD-10-CM | POA: Diagnosis not present

## 2016-08-07 DIAGNOSIS — R2681 Unsteadiness on feet: Secondary | ICD-10-CM | POA: Diagnosis not present

## 2016-08-07 DIAGNOSIS — F0280 Dementia in other diseases classified elsewhere without behavioral disturbance: Secondary | ICD-10-CM

## 2016-08-07 DIAGNOSIS — F329 Major depressive disorder, single episode, unspecified: Secondary | ICD-10-CM | POA: Diagnosis not present

## 2016-08-07 DIAGNOSIS — G7000 Myasthenia gravis without (acute) exacerbation: Secondary | ICD-10-CM

## 2016-08-07 DIAGNOSIS — F32A Depression, unspecified: Secondary | ICD-10-CM

## 2016-08-07 DIAGNOSIS — G301 Alzheimer's disease with late onset: Secondary | ICD-10-CM

## 2016-08-07 DIAGNOSIS — R262 Difficulty in walking, not elsewhere classified: Secondary | ICD-10-CM | POA: Diagnosis not present

## 2016-08-07 NOTE — Progress Notes (Signed)
Location:  [facility] Room Number: 323P Place of Service:  SNF [phone])  Merrilee Seashore, MD  Patient Care Team: Margit Hanks, MD as PCP - General (Internal Medicine)  Extended Emergency Contact Information Primary Emergency Contact: Beamon,David & Neoma Laming [place] Home Phone: [phone] Mobile Phone: [phone] Relation: Son Secondary Emergency Contact: Richardean Chimera [place] Home Phone: [phone] Relation: None    Allergies: Ceftin [cefuroxime]  Chief Complaint  Patient presents with  . Medical Management of Chronic Issues    Routine Visit    HPI: Patient is 82 y.o. male who is being seen for routine issues of dementia, myasthenia gravis and depression.  Past Medical History:  Diagnosis Date  . A-fib (HCC)   . Aortic stenosis   . Dementia    short term memory loss  . High cholesterol   . History of TIAs   . Hyperlipidemia   . Hypertension   . Multiple falls   . Urinary incontinence     Past Surgical History:  Procedure Laterality Date  . BACK SURGERY  2006  . CERVICAL LAMINECTOMY  2002 ?  [name] CYSTOSCOPY N/A 06/26/2015   Procedure: CYSTOSCOPY;  Surgeon: Malen Gauze, MD;  Location: [facility];  Service: Urology;  Laterality: N/A;  . HERNIA REPAIR     1970s  . INSERTION OF SUPRAPUBIC CATHETER N/A 06/26/2015   Procedure: INSERTION OF SUPRAPUBIC CATHETER;  Surgeon: Malen Gauze, MD;  Location: [facility];  Service: Urology;  Laterality: N/A;      Medication List       Accurate as of 08/07/16 11:59 PM. Always use your most recent med list.          acetaminophen 500 MG tablet Commonly known as:  TYLENOL Take 500 mg by mouth every 6 (six) hours as needed for mild pain.   cetaphil cream Apply topically to both feet as needed for dry skin   docusate sodium 100 MG capsule Commonly known as:  COLACE Take 100 mg by mouth 2 (two) times daily.   ELIQUIS 2.5 MG Tabs tablet Generic drug:   apixaban Take 2.5 mg by mouth 2 (two) times daily.   escitalopram 10 MG tablet Commonly known as:  LEXAPRO Take 10 mg by mouth daily.   metoprolol succinate 50 MG 24 hr tablet Commonly known as:  TOPROL-XL Take 50 mg by mouth every morning. Take with or immediately following a meal.   multivitamin with minerals Tabs tablet Take 1 tablet by mouth daily.   MYRBETRIQ 25 MG Tb24 tablet Generic drug:  mirabegron ER Take 25 mg by mouth daily.   NUTRITIONAL SUPPLEMENT PO Offer Magic Cup as bedtime snack related to weight loss   ENSURE Take 237 mLs by mouth 3 (three) times daily between meals.   predniSONE 10 MG tablet Commonly known as:  DELTASONE Take 1 tablet (10 mg total) by mouth daily with breakfast.   pyridostigmine 60 MG tablet Commonly known as:  MESTINON Take 60 mg by mouth 3 (three) times daily.   senna 8.6 MG tablet Commonly known as:  SENOKOT Take 1 tablet by mouth 2 (two) times daily.   simvastatin 10 MG tablet Commonly known as:  ZOCOR Take 10 mg by mouth daily.   Vitamin D3 2000 units Tabs Take 2,000 Units by mouth daily.       Meds ordered this encounter  Medications  . ENSURE (ENSURE)    Sig: Take 237 mLs by  mouth 3 (three) times daily between meals.    Immunization History  Administered Date(s) Administered  . Influenza,inj,Quad PF,36+ Mos 07/05/2015  . Influenza-Unspecified 07/17/2016  . PPD Test 11/09/2015  . Pneumococcal Polysaccharide-23 07/05/2015  . Tdap 03/31/2015    Social History  Substance Use Topics  . Smoking status: Former Games developer  . Smokeless tobacco: Never Used  . Alcohol use No    Review of Systems  DATA OBTAINED: from patient, nurse GENERAL:  no fevers, fatigue, appetite changes SKIN: No itching, rash HEENT: No complaint RESPIRATORY: No cough, wheezing, SOB CARDIAC: No chest pain, palpitations, lower extremity edema  GI: No abdominal pain, No N/V/D or constipation, No heartburn or reflux  GU: No dysuria, frequency  or urgency, or incontinence  MUSCULOSKELETAL: No unrelieved bone/joint pain NEUROLOGIC: No headache, dizziness  PSYCHIATRIC: No overt anxiety or sadness  Vitals:   08/07/16 1036  BP: 131/73  Pulse: 71  Resp: 18  Temp: 98 F (36.7 C)   Body mass index is 26.52 kg/m. Physical Exam  GENERAL APPEARANCE: Alert, mod conversant, No acute distress  SKIN: No diaphoresis rash HEENT: Unremarkable RESPIRATORY: Breathing is even, unlabored. Lung sounds are clear   CARDIOVASCULAR: Heart RRR no murmurs, rubs or gallops. No peripheral edema  GASTROINTESTINAL: Abdomen is soft, non-tender, not distended w/ normal bowel sounds.  GENITOURINARY: Bladder non tender, not distended  MUSCULOSKELETAL: No abnormal joints or musculature NEUROLOGIC: Cranial nerves 2-12 grossly intact. Moves all extremities PSYCHIATRIC: Mood and affect appropriate to situation with mild dementia, no behavioral issues  Patient Active Problem List   Diagnosis Date Noted  . Depression 08/22/2016  . Vitamin D deficiency 07/11/2016  . Encounter for family conference with patient present 12/14/2015  . Frequent falls 11/05/2015  . Candida UTI 10/01/2015  . FTT (failure to thrive) in adult 09/25/2015  . Neurogenic bladder 09/25/2015  . Inflammatory arthritis 08/21/2015  . 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 Bacteremia due to Klebsiella pneumoniae 08/07/2015  . Sepsis due to Klebsiella pneumoniae (HCC) 08/07/2015  . DNR (do not resuscitate) 08/06/2015  . Septic shock (HCC) 08/03/2015  . Hypotension 08/03/2015  . Leukocytosis 08/03/2015  . Catheter-associated urinary tract infection (HCC) 08/03/2015  . Palliative care encounter   . Acute renal failure superimposed on stage 3 chronic kidney disease (HCC) 07/13/2015  . Myasthenia gravis (HCC) 07/13/2015  . Acute encephalopathy 07/04/2015  . Fecal impaction (HCC) 07/04/2015  . Dementia 07/04/2015  . Pressure ulcer 07/04/2015  . AP (abdominal pain)   . Hypoxia   .  Altered mental status   . Consciousness loss of (HCC) 05/29/2015  . Vertigo, central origin 05/29/2015  . Dizziness and giddiness 05/29/2015  . Weakness 05/29/2015  . Cerebral brain hemorrhage (HCC) 05/29/2015  . TIA (transient ischemic attack) 05/29/2015  . Proximal limb muscle weakness 05/29/2015  . Ataxia 05/29/2015  . Syncope 03/31/2015  . CKD (chronic kidney disease), stage III 03/31/2015  . HTN (hypertension) 03/31/2015  . AF (atrial fibrillation) (HCC) 03/31/2015  . Dyslipidemia 03/31/2015    CMP     Component Value Date/Time   NA 144 04/06/2016   K 4.4 04/06/2016   CL 113 (H) 11/09/2015 0710   CO2 24 11/09/2015 0710   GLUCOSE 106 (H) 11/09/2015 0710   BUN 16 04/06/2016   CREATININE 1.0 04/06/2016   CREATININE 0.76 11/09/2015 0710   CALCIUM 8.4 (L) 11/09/2015 0710   PROT 5.1 (L) 11/06/2015 0330   PROT 7.7 05/29/2015 1319   ALBUMIN 2.5 (L) 11/06/2015 0330   ALBUMIN  4.0 05/29/2015 1319   AST 26 11/06/2015 0330   ALT 22 11/06/2015 0330   ALKPHOS 64 11/06/2015 0330   BILITOT 0.8 11/06/2015 0330   BILITOT 1.2 05/29/2015 1319   GFRNONAA >60 11/09/2015 0710   GFRAA >60 11/09/2015 0710    Recent Labs  11/07/15 0334 11/08/15 0436 11/09/15 0710 04/06/16  NA 145 139 144 144  K 4.1 3.7 3.7 4.4  CL 114* 109 113*  --   CO2 23 22 24   --   GLUCOSE 121* 117* 106*  --   BUN 18 13 11 16   CREATININE 1.02 0.84 0.76 1.0  CALCIUM 8.5* 8.2* 8.4*  --     Recent Labs  11/05/15 1258 11/06/15 0330  AST 49* 26  ALT 28 22  ALKPHOS 75 64  BILITOT 0.6 0.8  PROT 6.0* 5.1*  ALBUMIN 2.9* 2.5*    Recent Labs  11/05/15 1258  11/07/15 0334 11/08/15 0436 11/09/15 0710 04/06/16  WBC 28.8*  < > 12.1* 9.6 7.9 4.5  NEUTROABS 25.6*  --   --   --   --   --   HGB 15.0  < > 12.5* 12.0* 11.9* 13.5  HCT 44.3  < > 39.9 37.4* 37.1* 43  MCV 87.0  < > 90.7 89.5 87.5  --   PLT 247  < > 159 157 159 181  < > = values in this interval not displayed.  Recent Labs  01/28/16 02/19/16    CHOL 155 153  LDLCALC 78 65  TRIG 177* 198*   No results found for: [facility] Lab Results  Component Value Date   TSH 0.872 05/29/2015   No results found for: HGBA1C Lab Results  Component Value Date   CHOL 153 02/19/2016   HDL 48 02/19/2016   LDLCALC 65 02/19/2016   TRIG 198 (A) 02/19/2016   CHOLHDL 9.3 01/20/2015    Significant Diagnostic Results in last 30 days:  No results found.  Assessment and Plan  Dementia No declines, pt is doing well; will cont to monitor.  Myasthenia gravis (HCC) Pt with no reported sx or observed symptoms;plan to cont mestinon 30 mg TID  Depression Appears well adjusted;plan to cont laxapro 10 mg daily      Amador Braddy D. Lyn HollingsheadAlexander, MD

## 2016-08-08 DIAGNOSIS — R2681 Unsteadiness on feet: Secondary | ICD-10-CM | POA: Diagnosis not present

## 2016-08-08 DIAGNOSIS — R262 Difficulty in walking, not elsewhere classified: Secondary | ICD-10-CM | POA: Diagnosis not present

## 2016-08-08 DIAGNOSIS — R488 Other symbolic dysfunctions: Secondary | ICD-10-CM | POA: Diagnosis not present

## 2016-08-08 DIAGNOSIS — G7000 Myasthenia gravis without (acute) exacerbation: Secondary | ICD-10-CM | POA: Diagnosis not present

## 2016-08-10 DIAGNOSIS — G7000 Myasthenia gravis without (acute) exacerbation: Secondary | ICD-10-CM | POA: Diagnosis not present

## 2016-08-10 DIAGNOSIS — R262 Difficulty in walking, not elsewhere classified: Secondary | ICD-10-CM | POA: Diagnosis not present

## 2016-08-10 DIAGNOSIS — R488 Other symbolic dysfunctions: Secondary | ICD-10-CM | POA: Diagnosis not present

## 2016-08-10 DIAGNOSIS — R2681 Unsteadiness on feet: Secondary | ICD-10-CM | POA: Diagnosis not present

## 2016-08-11 DIAGNOSIS — R488 Other symbolic dysfunctions: Secondary | ICD-10-CM | POA: Diagnosis not present

## 2016-08-11 DIAGNOSIS — R2681 Unsteadiness on feet: Secondary | ICD-10-CM | POA: Diagnosis not present

## 2016-08-11 DIAGNOSIS — G7000 Myasthenia gravis without (acute) exacerbation: Secondary | ICD-10-CM | POA: Diagnosis not present

## 2016-08-11 DIAGNOSIS — R262 Difficulty in walking, not elsewhere classified: Secondary | ICD-10-CM | POA: Diagnosis not present

## 2016-08-20 DIAGNOSIS — F0390 Unspecified dementia without behavioral disturbance: Secondary | ICD-10-CM | POA: Diagnosis not present

## 2016-08-20 DIAGNOSIS — F329 Major depressive disorder, single episode, unspecified: Secondary | ICD-10-CM | POA: Diagnosis not present

## 2016-08-20 DIAGNOSIS — F419 Anxiety disorder, unspecified: Secondary | ICD-10-CM | POA: Diagnosis not present

## 2016-08-22 ENCOUNTER — Encounter: Payer: Self-pay | Admitting: Internal Medicine

## 2016-08-22 DIAGNOSIS — F32A Depression, unspecified: Secondary | ICD-10-CM

## 2016-08-22 DIAGNOSIS — F329 Major depressive disorder, single episode, unspecified: Secondary | ICD-10-CM | POA: Insufficient documentation

## 2016-08-22 HISTORY — DX: Depression, unspecified: F32.A

## 2016-08-22 NOTE — Assessment & Plan Note (Signed)
Pt with no reported sx or observed symptoms;plan to cont mestinon 30 mg TID

## 2016-08-22 NOTE — Assessment & Plan Note (Signed)
No declines, pt is doing well; will cont to monitor.

## 2016-08-22 NOTE — Assessment & Plan Note (Signed)
Appears well adjusted;plan to cont laxapro 10 mg daily

## 2016-08-25 DIAGNOSIS — E785 Hyperlipidemia, unspecified: Secondary | ICD-10-CM | POA: Diagnosis not present

## 2016-08-25 DIAGNOSIS — I10 Essential (primary) hypertension: Secondary | ICD-10-CM | POA: Diagnosis not present

## 2016-08-25 DIAGNOSIS — R350 Frequency of micturition: Secondary | ICD-10-CM | POA: Diagnosis not present

## 2016-08-25 DIAGNOSIS — R338 Other retention of urine: Secondary | ICD-10-CM | POA: Diagnosis not present

## 2016-08-25 DIAGNOSIS — E559 Vitamin D deficiency, unspecified: Secondary | ICD-10-CM | POA: Diagnosis not present

## 2016-08-25 DIAGNOSIS — E119 Type 2 diabetes mellitus without complications: Secondary | ICD-10-CM | POA: Diagnosis not present

## 2016-08-25 LAB — HEMOGLOBIN A1C: Hemoglobin A1C: 6.4

## 2016-08-25 LAB — HEPATIC FUNCTION PANEL
ALK PHOS: 73 U/L (ref 25–125)
ALT: 22 U/L (ref 10–40)
AST: 17 U/L (ref 14–40)
Bilirubin, Total: 0.6 mg/dL

## 2016-08-25 LAB — LIPID PANEL
Cholesterol: 144 mg/dL (ref 0–200)
HDL: 48 mg/dL (ref 35–70)
LDL CALC: 64 mg/dL
Triglycerides: 156 mg/dL (ref 40–160)

## 2016-08-25 LAB — BASIC METABOLIC PANEL
BUN: 13 mg/dL (ref 4–21)
Creatinine: 0.9 mg/dL (ref 0.6–1.3)
GLUCOSE: 115 mg/dL
Potassium: 4.2 mmol/L (ref 3.4–5.3)
SODIUM: 144 mmol/L (ref 137–147)

## 2016-08-25 LAB — CBC AND DIFFERENTIAL
HEMATOCRIT: 44 % (ref 41–53)
HEMOGLOBIN: 13.7 g/dL (ref 13.5–17.5)
PLATELETS: 308 10*3/uL (ref 150–399)
WBC: 9 10*3/mL

## 2016-08-25 LAB — PSA
PSA: 7.95
PSA: 8.09

## 2016-08-25 LAB — TSH: TSH: 0.82 u[IU]/mL (ref 0.41–5.90)

## 2016-08-28 IMAGING — DX DG CHEST 1V PORT
1 series · 1 of 1 positions shown · non-contrast
Comparison: 11/05/2015

CLINICAL DATA: Status post line placement.

EXAM:
PORTABLE CHEST 1 VIEW

[chest ap]
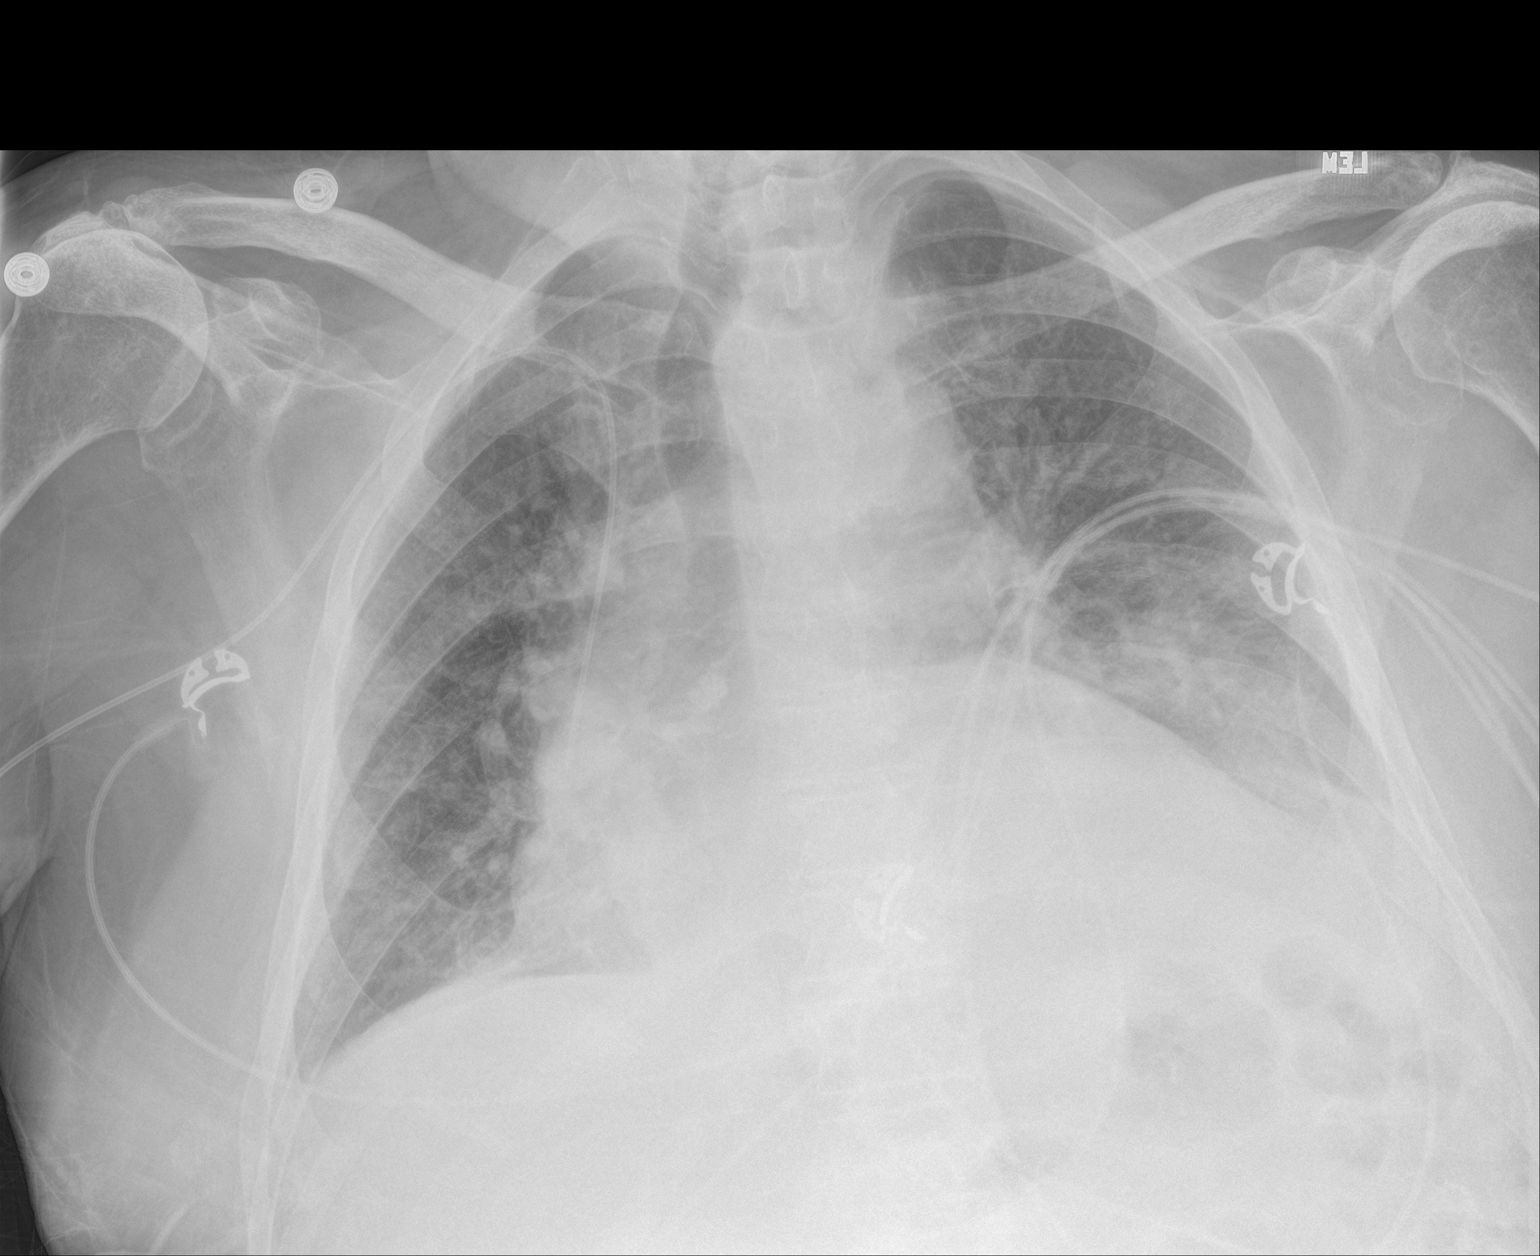

[1 of 1 positions shown; findings below may reference images not displayed]

FINDINGS: There has been a right PICC line placement. Tip overlies the
expected location of cavoatrial junction.

The cardiac silhouette is enlarged. Mediastinal contours appear
intact. There is left retrocardiac airspace consolidation and/or
left pleural effusion.

There is no evidence of pneumothorax.

Osseous structures are without acute abnormality. Soft tissues are
grossly normal.
IMPRESSION: Status post right PICC line placement.  No evidence of pneumothorax.

Left retrocardiac airspace consolidation and/or left pleural
effusion.

## 2016-09-04 ENCOUNTER — Non-Acute Institutional Stay (SKILLED_NURSING_FACILITY): Payer: Medicare Other | Admitting: Internal Medicine

## 2016-09-04 ENCOUNTER — Encounter: Payer: Self-pay | Admitting: Internal Medicine

## 2016-09-04 DIAGNOSIS — E785 Hyperlipidemia, unspecified: Secondary | ICD-10-CM | POA: Diagnosis not present

## 2016-09-04 DIAGNOSIS — N183 Chronic kidney disease, stage 3 unspecified: Secondary | ICD-10-CM

## 2016-09-04 DIAGNOSIS — I4891 Unspecified atrial fibrillation: Secondary | ICD-10-CM

## 2016-09-04 NOTE — Progress Notes (Signed)
Location[facility] Room Number: 405P Place of Service:  SNF [phone]  Merrilee SeashoreAnne Momin Misko, MD  Patient Care Team: Margit HanksAnne D Maeson Lourenco, MD as PCP - General (Internal Medicine)  Extended Emergency Contact Information Primary Emergency Contact: Kihara,David & Neoma LamingEllen  [place] Home Phone: [phone] Mobile Phone: [phone] Relation: Son Secondary Emergency Contact: Richardean ChimeraSmoak,Mark  [place] Home Phone: [phone] Relation: None    Allergies: Ceftin [cefuroxime]  Chief Complaint  Patient presents with  . Medical Management of Chronic Issues    Routine Visit    HPI: Patient is 82 y.o. male who is being seen for routine issues of AF, CKD3 and HLD.  Past Medical History:  Diagnosis Date  . A-fib (HCC)   . Aortic stenosis   . Dementia    short term memory loss  . High cholesterol   . History of TIAs   . Hyperlipidemia   . Hypertension   . Multiple falls   . Urinary incontinence     Past Surgical History:  Procedure Laterality Date  . BACK SURGERY  2006  . CERVICAL LAMINECTOMY  2002 ?  [name]. CYSTOSCOPY N/A 06/26/2015   Procedure: CYSTOSCOPY;  Surgeon: Malen GauzePatrick L McKenzie, MD;  Location: [facility];  Service: Urology;  Laterality: N/A;  . HERNIA REPAIR     1970s  . INSERTION OF SUPRAPUBIC CATHETER N/A 06/26/2015   Procedure: INSERTION OF SUPRAPUBIC CATHETER;  Surgeon: Malen GauzePatrick L McKenzie, MD;  Location: [facility];  Service: Urology;  Laterality: N/A;      Medication List       Accurate as of 09/04/16 11:59 PM. Always use your most recent med list.          acetaminophen 500 MG tablet Commonly known as:  TYLENOL Take 500 mg by mouth every 6 (six) hours as needed for mild pain.   cetaphil cream Apply topically to both feet as needed for dry skin   docusate sodium 100 MG capsule Commonly known as:  COLACE Take 100 mg by mouth 2 (two) times daily.   ELIQUIS 2.5 MG Tabs tablet Generic drug:  apixaban Take 2.5 mg by  mouth 2 (two) times daily.   escitalopram 5 MG tablet Commonly known as:  LEXAPRO Take 5 mg by mouth daily.   metoprolol succinate 50 MG 24 hr tablet Commonly known as:  TOPROL-XL Take 50 mg by mouth every morning. Take with or immediately following a meal.   multivitamin with minerals Tabs tablet Take 1 tablet by mouth daily.   MYRBETRIQ 25 MG Tb24 tablet Generic drug:  mirabegron ER Take 25 mg by mouth daily.   NUTRITIONAL SUPPLEMENT PO Offer Magic Cup as bedtime snack related to weight loss   ENSURE Take 237 mLs by mouth 3 (three) times daily between meals.   predniSONE 10 MG tablet Commonly known as:  DELTASONE Take 1 tablet (10 mg total) by mouth daily with breakfast.   pyridostigmine 60 MG tablet Commonly known as:  MESTINON Take 60 mg by mouth 3 (three) times daily.   senna 8.6 MG tablet Commonly known as:  SENOKOT Take 1 tablet by mouth 2 (two) times daily.   simvastatin 10 MG tablet Commonly known as:  ZOCOR Take 10 mg by mouth daily.   Vitamin D3 2000 units Tabs Take 2,000 Units by mouth daily.       Meds ordered this encounter  Medications  . escitalopram (LEXAPRO) 5 MG tablet    Sig: Take 5  mg by mouth daily.    Immunization History  Administered Date(s) Administered  . Influenza,inj,Quad PF,36+ Mos 07/05/2015  . Influenza-Unspecified 07/17/2016  . PPD Test 11/09/2015  . Pneumococcal Polysaccharide-23 07/05/2015  . Tdap 03/31/2015    Social History  Substance Use Topics  . Smoking status: Former Games developer  . Smokeless tobacco: Never Used  . Alcohol use No    Review of Systems  DATA OBTAINED: from patient, nurse GENERAL:  no fevers, fatigue, appetite changes SKIN: No itching, rash HEENT: No complaint RESPIRATORY: No cough, wheezing, SOB CARDIAC: No chest pain, palpitations, lower extremity edema  GI: No abdominal pain, No N/V/D or constipation, No heartburn or reflux  GU: No dysuria, frequency or urgency, or incontinence    MUSCULOSKELETAL: No unrelieved bone/joint pain NEUROLOGIC: No headache, dizziness  PSYCHIATRIC: No overt anxiety or sadness  Vitals:   09/04/16 0828  BP: 122/66  Pulse: 68  Resp: 18  Temp: 97.5 F (36.4 C)   Body mass index is 26.26 kg/m. Physical Exam  GENERAL APPEARANCE: Alert, mod conversant, No acute distress  SKIN: No diaphoresis rash HEENT: Unremarkable RESPIRATORY: Breathing is even, unlabored. Lung sounds are clear   CARDIOVASCULAR: Heart RRR no murmurs, rubs or gallops. No peripheral edema  GASTROINTESTINAL: Abdomen is soft, non-tender, not distended w/ normal bowel sounds.  GENITOURINARY: Bladder non tender, not distended  MUSCULOSKELETAL: No abnormal joints or musculature NEUROLOGIC: Cranial nerves 2-12 grossly intact. Moves all extremities PSYCHIATRIC: Mood and affect appropriate to situation, seems content, no behavioral issues  Patient Active Problem List   Diagnosis Date Noted  . Depression 08/22/2016  . Vitamin D deficiency 07/11/2016  . Encounter for family conference with patient present 12/14/2015  . Frequent falls 11/05/2015  . Candida UTI 10/01/2015  . FTT (failure to thrive) in adult 09/25/2015  . Neurogenic bladder 09/25/2015  . Inflammatory arthritis 08/21/2015  . 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 Bacteremia due to Klebsiella pneumoniae 08/07/2015  . Sepsis due to Klebsiella pneumoniae (HCC) 08/07/2015  . DNR (do not resuscitate) 08/06/2015  . Septic shock (HCC) 08/03/2015  . Hypotension 08/03/2015  . Leukocytosis 08/03/2015  . Catheter-associated urinary tract infection (HCC) 08/03/2015  . Palliative care encounter   . Acute renal failure superimposed on stage 3 chronic kidney disease (HCC) 07/13/2015  . Myasthenia gravis (HCC) 07/13/2015  . Acute encephalopathy 07/04/2015  . Fecal impaction (HCC) 07/04/2015  . Dementia 07/04/2015  . Pressure ulcer 07/04/2015  . AP (abdominal pain)   . Hypoxia   . Altered mental status   .  Consciousness loss of (HCC) 05/29/2015  . Vertigo, central origin 05/29/2015  . Dizziness and giddiness 05/29/2015  . Weakness 05/29/2015  . Cerebral brain hemorrhage (HCC) 05/29/2015  . TIA (transient ischemic attack) 05/29/2015  . Proximal limb muscle weakness 05/29/2015  . Ataxia 05/29/2015  . Syncope 03/31/2015  . CKD (chronic kidney disease), stage III 03/31/2015  . HTN (hypertension) 03/31/2015  . AF (atrial fibrillation) (HCC) 03/31/2015  . Dyslipidemia 03/31/2015    CMP     Component Value Date/Time   NA 144 08/25/2016   K 4.2 08/25/2016   CL 113 (H) 11/09/2015 0710   CO2 24 11/09/2015 0710   GLUCOSE 106 (H) 11/09/2015 0710   BUN 13 08/25/2016   CREATININE 0.9 08/25/2016   CREATININE 0.76 11/09/2015 0710   CALCIUM 8.4 (L) 11/09/2015 0710   PROT 5.1 (L) 11/06/2015 0330   PROT 7.7 05/29/2015 1319   ALBUMIN 2.5 (L) 11/06/2015 0330   ALBUMIN 4.0 05/29/2015 1319  AST 17 08/25/2016   ALT 22 08/25/2016   ALKPHOS 73 08/25/2016   BILITOT 0.8 11/06/2015 0330   BILITOT 1.2 05/29/2015 1319   GFRNONAA >60 11/09/2015 0710   GFRAA >60 11/09/2015 0710    Recent Labs  11/07/15 0334 11/08/15 0436 11/09/15 0710 04/06/16 08/25/16  NA 145 139 144 144 144  K 4.1 3.7 3.7 4.4 4.2  CL 114* 109 113*  --   --   CO2 23 22 24   --   --   GLUCOSE 121* 117* 106*  --   --   BUN 18 13 11 16 13   CREATININE 1.02 0.84 0.76 1.0 0.9  CALCIUM 8.5* 8.2* 8.4*  --   --     Recent Labs  11/05/15 1258 11/06/15 0330 08/25/16  AST 49* 26 17  ALT 28 22 22   ALKPHOS 75 64 73  BILITOT 0.6 0.8  --   PROT 6.0* 5.1*  --   ALBUMIN 2.9* 2.5*  --     Recent Labs  11/05/15 1258  11/07/15 0334 11/08/15 0436 11/09/15 0710 04/06/16 08/25/16  WBC 28.8*  < > 12.1* 9.6 7.9 4.5 9.0  NEUTROABS 25.6*  --   --   --   --   --   --   HGB 15.0  < > 12.5* 12.0* 11.9* 13.5 13.7  HCT 44.3  < > 39.9 37.4* 37.1* 43 44  MCV 87.0  < > 90.7 89.5 87.5  --   --   PLT 247  < > 159 157 159 181 308  < > = values  in this interval not displayed.  Recent Labs  01/28/16 02/19/16 08/25/16  CHOL 155 153 144  LDLCALC 78 65 64  TRIG 177* 198* 156   No results found for: [facility] Lab Results  Component Value Date   TSH 0.82 08/25/2016   Lab Results  Component Value Date   HGBA1C 6.4 08/25/2016   Lab Results  Component Value Date   CHOL 144 08/25/2016   HDL 48 08/25/2016   LDLCALC 64 08/25/2016   TRIG 156 08/25/2016   CHOLHDL 9.3 01/20/2015    Significant Diagnostic Results in last 30 days:  No results found.  Assessment and Plan  AF (atrial fibrillation) (HCC) No problems reported with metoprolol XL 50 mg daily and eliquis 2.5 mg BID as prophylaxis  CKD (chronic kidney disease), stage III No recent GFR; BUN/Cr 13/0.9 which is stable from prior;plan to monitor at intrvals  Dyslipidemia In 08/2016  LDL  64, HDL 58, TG 156; plan to cont zocor 10 mg daily     Kellsey Sansone D. Lyn HollingsheadAlexander, MD

## 2016-09-06 ENCOUNTER — Encounter: Payer: Self-pay | Admitting: Internal Medicine

## 2016-09-06 NOTE — Assessment & Plan Note (Signed)
In 08/2016  LDL  64, HDL 58, TG 156; plan to cont zocor 10 mg daily

## 2016-09-06 NOTE — Assessment & Plan Note (Signed)
No problems reported with metoprolol XL 50 mg daily and eliquis 2.5 mg BID as prophylaxis

## 2016-09-06 NOTE — Assessment & Plan Note (Signed)
No recent GFR; BUN/Cr 13/0.9 which is stable from prior;plan to monitor at intrvals

## 2016-09-22 DIAGNOSIS — R338 Other retention of urine: Secondary | ICD-10-CM | POA: Diagnosis not present

## 2016-10-05 ENCOUNTER — Encounter: Payer: Self-pay | Admitting: Internal Medicine

## 2016-10-05 ENCOUNTER — Non-Acute Institutional Stay (SKILLED_NURSING_FACILITY): Payer: Medicare Other | Admitting: Internal Medicine

## 2016-10-05 DIAGNOSIS — E559 Vitamin D deficiency, unspecified: Secondary | ICD-10-CM | POA: Diagnosis not present

## 2016-10-05 DIAGNOSIS — N319 Neuromuscular dysfunction of bladder, unspecified: Secondary | ICD-10-CM

## 2016-10-05 DIAGNOSIS — I10 Essential (primary) hypertension: Secondary | ICD-10-CM

## 2016-10-05 NOTE — Progress Notes (Signed)
Location:  [facility] Room Number: 405P Place of Service:  SNF [phone])  Merrilee Seashore, MD  Patient Care Team: Margit Hanks, MD as PCP - General (Internal Medicine)  Extended Emergency Contact Information Primary Emergency Contact: Maull,David & Neoma Laming [place] Home Phone: [phone] Mobile Phone: [phone] Relation: Son Secondary Emergency Contact: Richardean Chimera [place] Home Phone: [phone] Relation: None    Allergies: Ceftin [cefuroxime]  Chief Complaint  Patient presents with  . Medical Management of Chronic Issues    Routine Visit    HPI: Patient is 82 y.o. male who is being seen for routine issues of HTN, neurogenic bladder and Vit D deficiency.  Past Medical History:  Diagnosis Date  . A-fib (HCC)   . Aortic stenosis   . Dementia    short term memory loss  . High cholesterol   . History of TIAs   . Hyperlipidemia   . Hypertension   . Multiple falls   . Urinary incontinence     Past Surgical History:  Procedure Laterality Date  . BACK SURGERY  2006  . CERVICAL LAMINECTOMY  2002 ?  [name] CYSTOSCOPY N/A 06/26/2015   Procedure: CYSTOSCOPY;  Surgeon: Malen Gauze, MD;  Location: [facility];  Service: Urology;  Laterality: N/A;  . HERNIA REPAIR     1970s  . INSERTION OF SUPRAPUBIC CATHETER N/A 06/26/2015   Procedure: INSERTION OF SUPRAPUBIC CATHETER;  Surgeon: Malen Gauze, MD;  Location: [facility];  Service: Urology;  Laterality: N/A;    Allergies as of 10/05/2016      Reactions   Ceftin [cefuroxime] Diarrhea   Severe diarrhea      Medication List       Accurate as of 10/05/16 11:59 PM. Always use your most recent med list.          acetaminophen 500 MG tablet Commonly known as:  TYLENOL Take 500 mg by mouth every 6 (six) hours as needed for mild pain.   cetaphil cream Apply topically to both feet as needed for dry skin   docusate sodium 100 MG capsule Commonly known  as:  COLACE Take 100 mg by mouth 2 (two) times daily.   ELIQUIS 2.5 MG Tabs tablet Generic drug:  apixaban Take 2.5 mg by mouth 2 (two) times daily.   escitalopram 5 MG tablet Commonly known as:  LEXAPRO Take 5 mg by mouth daily.   metoprolol succinate 50 MG 24 hr tablet Commonly known as:  TOPROL-XL Take 50 mg by mouth every morning. Take with or immediately following a meal.   multivitamin with minerals Tabs tablet Take 1 tablet by mouth daily.   MYRBETRIQ 25 MG Tb24 tablet Generic drug:  mirabegron ER Take 25 mg by mouth daily.   NUTRITIONAL SUPPLEMENT PO Offer Magic Cup as bedtime snack related to weight loss   ENSURE Take 237 mLs by mouth 3 (three) times daily between meals.   predniSONE 10 MG tablet Commonly known as:  DELTASONE Take 1 tablet (10 mg total) by mouth daily with breakfast.   pyridostigmine 60 MG tablet Commonly known as:  MESTINON Take 60 mg by mouth 3 (three) times daily.   senna 8.6 MG tablet Commonly known as:  SENOKOT Take 1 tablet by mouth 2 (two) times daily.   simvastatin 10 MG tablet Commonly known as:  ZOCOR Take 10 mg by mouth daily.   Vitamin D3 2000 units Tabs Take 2,000 Units by mouth daily.  No orders of the defined types were placed in this encounter.   Immunization History  Administered Date(s) Administered  . Influenza,inj,Quad PF,36+ Mos 07/05/2015  . Influenza-Unspecified 07/17/2016  . PPD Test 11/09/2015  . Pneumococcal Polysaccharide-23 07/05/2015  . Tdap 03/31/2015    Social History  Substance Use Topics  . Smoking status: Former Games developermoker  . Smokeless tobacco: Never Used  . Alcohol use No    Review of Systems  DATA OBTAINED: from patient GENERAL:  no fevers, fatigue, appetite changes SKIN: No itching, rash HEENT: No complaint RESPIRATORY: No cough, wheezing, SOB CARDIAC: No chest pain, palpitations, lower extremity edema  GI: No abdominal pain, No N/V/D or constipation, No heartburn or reflux    GU: No dysuria, frequency or urgency, or incontinence  MUSCULOSKELETAL: No unrelieved bone/joint pain NEUROLOGIC: No headache, dizziness  PSYCHIATRIC: No overt anxiety or sadness  Vitals:   10/05/16 1114  BP: 126/72  Pulse: 77  Resp: 20  Temp: 97.6 F (36.4 C)   Body mass index is 26.52 kg/m. Physical Exam  GENERAL APPEARANCE: Alert, conversant, No acute distress  SKIN: No diaphoresis rash HEENT: Unremarkable RESPIRATORY: Breathing is even, unlabored. Lung sounds are clear   CARDIOVASCULAR: Heart RRR no murmurs, rubs or gallops. No peripheral edema  GASTROINTESTINAL: Abdomen is soft, non-tender, not distended w/ normal bowel sounds.  GENITOURINARY: Bladder non tender, not distended  MUSCULOSKELETAL: No abnormal joints or musculature NEUROLOGIC: Cranial nerves 2-12 grossly intact. Moves all extremities PSYCHIATRIC: Mood and affect appropriate to situation with mild dementia, no behavioral issues  Patient Active Problem List   Diagnosis Date Noted  . Depression 08/22/2016  . Vitamin D deficiency 07/11/2016  . Encounter for family conference with patient present 12/14/2015  . Frequent falls 11/05/2015  . Candida UTI 10/01/2015  . FTT (failure to thrive) in adult 09/25/2015  . Neurogenic bladder 09/25/2015  . Inflammatory arthritis 08/21/2015  . 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 Bacteremia due to Klebsiella pneumoniae 08/07/2015  . Sepsis due to Klebsiella pneumoniae (HCC) 08/07/2015  . DNR (do not resuscitate) 08/06/2015  . Septic shock (HCC) 08/03/2015  . Hypotension 08/03/2015  . Leukocytosis 08/03/2015  . Catheter-associated urinary tract infection (HCC) 08/03/2015  . Palliative care encounter   . Acute renal failure superimposed on stage 3 chronic kidney disease (HCC) 07/13/2015  . Myasthenia gravis (HCC) 07/13/2015  . Acute encephalopathy 07/04/2015  . Fecal impaction (HCC) 07/04/2015  . Dementia 07/04/2015  . Pressure ulcer 07/04/2015  . AP (abdominal  pain)   . Hypoxia   . Altered mental status   . Consciousness loss of (HCC) 05/29/2015  . Vertigo, central origin 05/29/2015  . Dizziness and giddiness 05/29/2015  . Weakness 05/29/2015  . Cerebral brain hemorrhage (HCC) 05/29/2015  . TIA (transient ischemic attack) 05/29/2015  . Proximal limb muscle weakness 05/29/2015  . Ataxia 05/29/2015  . Syncope 03/31/2015  . CKD (chronic kidney disease), stage III 03/31/2015  . HTN (hypertension) 03/31/2015  . AF (atrial fibrillation) (HCC) 03/31/2015  . Dyslipidemia 03/31/2015    CMP     Component Value Date/Time   NA 144 08/25/2016   K 4.2 08/25/2016   CL 113 (H) 11/09/2015 0710   CO2 24 11/09/2015 0710   GLUCOSE 106 (H) 11/09/2015 0710   BUN 13 08/25/2016   CREATININE 0.9 08/25/2016   CREATININE 0.76 11/09/2015 0710   CALCIUM 8.4 (L) 11/09/2015 0710   PROT 5.1 (L) 11/06/2015 0330   PROT 7.7 05/29/2015 1319   ALBUMIN 2.5 (L) 11/06/2015 0330  ALBUMIN 4.0 05/29/2015 1319   AST 17 08/25/2016   ALT 22 08/25/2016   ALKPHOS 73 08/25/2016   BILITOT 0.8 11/06/2015 0330   BILITOT 1.2 05/29/2015 1319   GFRNONAA >60 11/09/2015 0710   GFRAA >60 11/09/2015 0710    Recent Labs  11/07/15 0334 11/08/15 0436 11/09/15 0710 04/06/16 08/25/16  NA 145 139 144 144 144  K 4.1 3.7 3.7 4.4 4.2  CL 114* 109 113*  --   --   CO2 23 22 24   --   --   GLUCOSE 121* 117* 106*  --   --   BUN 18 13 11 16 13   CREATININE 1.02 0.84 0.76 1.0 0.9  CALCIUM 8.5* 8.2* 8.4*  --   --     Recent Labs  11/05/15 1258 11/06/15 0330 08/25/16  AST 49* 26 17  ALT 28 22 22   ALKPHOS 75 64 73  BILITOT 0.6 0.8  --   PROT 6.0* 5.1*  --   ALBUMIN 2.9* 2.5*  --     Recent Labs  11/05/15 1258  11/07/15 0334 11/08/15 0436 11/09/15 0710 04/06/16 08/25/16  WBC 28.8*  < > 12.1* 9.6 7.9 4.5 9.0  NEUTROABS 25.6*  --   --   --   --   --   --   HGB 15.0  < > 12.5* 12.0* 11.9* 13.5 13.7  HCT 44.3  < > 39.9 37.4* 37.1* 43 44  MCV 87.0  < > 90.7 89.5 87.5  --   --    PLT 247  < > 159 157 159 181 308  < > = values in this interval not displayed.  Recent Labs  01/28/16 02/19/16 08/25/16  CHOL 155 153 144  LDLCALC 78 65 64  TRIG 177* 198* 156   [facility] results found for: [facility] Lab Results  Component Value Date   TSH 0.82 08/25/2016   Lab Results  Component Value Date   HGBA1C 6.4 08/25/2016   Lab Results  Component Value Date   CHOL 144 08/25/2016   HDL 48 08/25/2016   LDLCALC 64 08/25/2016   TRIG 156 08/25/2016   CHOLHDL 9.3 01/20/2015    Significant Diagnostic Results in last 30 days:  No results found.  Assessment and Plan  HTN (hypertension) Well controlled ; cont toprol XL 50 mg q am  Neurogenic bladder No known recent infections in pt with suprapubic catheter; plan to cont myrbetric 24 hour 25 mg daily  Vitamin D deficiency Stable ;plan to cont Vit D 2000 u daily    Wynema Garoutte D. Lyn HollingsheadAlexander, MD

## 2016-10-09 DIAGNOSIS — Z87440 Personal history of urinary (tract) infections: Secondary | ICD-10-CM | POA: Diagnosis not present

## 2016-10-09 DIAGNOSIS — R319 Hematuria, unspecified: Secondary | ICD-10-CM | POA: Diagnosis not present

## 2016-10-09 DIAGNOSIS — N390 Urinary tract infection, site not specified: Secondary | ICD-10-CM | POA: Diagnosis not present

## 2016-10-19 ENCOUNTER — Encounter: Payer: Self-pay | Admitting: Internal Medicine

## 2016-10-19 NOTE — Assessment & Plan Note (Signed)
Well controlled ; cont toprol XL 50 mg q am

## 2016-10-19 NOTE — Assessment & Plan Note (Signed)
Stable ;plan to cont Vit D 2000 u daily

## 2016-10-19 NOTE — Assessment & Plan Note (Signed)
No known recent infections in pt with suprapubic catheter; plan to cont myrbetric 24 hour 25 mg daily

## 2016-10-20 DIAGNOSIS — R339 Retention of urine, unspecified: Secondary | ICD-10-CM | POA: Diagnosis not present

## 2016-10-29 DIAGNOSIS — F329 Major depressive disorder, single episode, unspecified: Secondary | ICD-10-CM | POA: Diagnosis not present

## 2016-10-29 DIAGNOSIS — F0390 Unspecified dementia without behavioral disturbance: Secondary | ICD-10-CM | POA: Diagnosis not present

## 2016-10-29 DIAGNOSIS — F419 Anxiety disorder, unspecified: Secondary | ICD-10-CM | POA: Diagnosis not present

## 2016-11-12 ENCOUNTER — Non-Acute Institutional Stay (SKILLED_NURSING_FACILITY): Payer: Medicare Other | Admitting: Internal Medicine

## 2016-11-12 ENCOUNTER — Encounter: Payer: Self-pay | Admitting: Internal Medicine

## 2016-11-12 DIAGNOSIS — F32A Depression, unspecified: Secondary | ICD-10-CM

## 2016-11-12 DIAGNOSIS — G7000 Myasthenia gravis without (acute) exacerbation: Secondary | ICD-10-CM

## 2016-11-12 DIAGNOSIS — F329 Major depressive disorder, single episode, unspecified: Secondary | ICD-10-CM

## 2016-11-12 DIAGNOSIS — E785 Hyperlipidemia, unspecified: Secondary | ICD-10-CM

## 2016-11-12 DIAGNOSIS — F0280 Dementia in other diseases classified elsewhere without behavioral disturbance: Secondary | ICD-10-CM

## 2016-11-12 DIAGNOSIS — G301 Alzheimer's disease with late onset: Secondary | ICD-10-CM

## 2016-11-12 NOTE — Progress Notes (Signed)
[facility] Room Number: 405P Place of Service:  SNF [phone]  Merrilee SeashoreAnne Essynce Munsch, MD  Patient Care Team: Margit HanksAnne D Angee Gupton, MD as PCP - General (Internal Medicine)  Extended Emergency Contact Information Primary Emergency Contact: Fyock,David & Neoma LamingEllen  [place] Home Phone: [phone] Mobile Phone: [phone] Relation: Son Secondary Emergency Contact: Richardean ChimeraSmoak,Mark  [place] Home Phone: [phone] Relation: None    Allergies: Ceftin [cefuroxime]  Chief Complaint  Patient presents with  . Medical Management of Chronic Issues    Routine Visit    HPI: Patient is 83 y.o. male who is being seen for routine issues of HLD, MG , depression and dementia.   Past Medical History:  Diagnosis Date  . A-fib (HCC)   . Aortic stenosis   . Dementia    short term memory loss  . High cholesterol   . History of TIAs   . Hyperlipidemia   . Hypertension   . Multiple falls   . Urinary incontinence     Past Surgical History:  Procedure Laterality Date  . BACK SURGERY  2006  . CERVICAL LAMINECTOMY  2002 ?  [name]. CYSTOSCOPY N/A 06/26/2015   Procedure: CYSTOSCOPY;  Surgeon: Malen GauzePatrick L McKenzie, MD;  Location: [facility];  Service: Urology;  Laterality: N/A;  . HERNIA REPAIR     1970s  . INSERTION OF SUPRAPUBIC CATHETER N/A 06/26/2015   Procedure: INSERTION OF SUPRAPUBIC CATHETER;  Surgeon: Malen GauzePatrick L McKenzie, MD;  Location: [facility];  Service: Urology;  Laterality: N/A;    Allergies as of 11/12/2016      Reactions   Ceftin [cefuroxime] Diarrhea   Severe diarrhea      Medication List       Accurate as of 11/12/16 11:59 PM. Always use your most recent med list.          acetaminophen 500 MG tablet Commonly known as:  TYLENOL Take 500 mg by mouth every 6 (six) hours as needed for mild pain.   cetaphil cream Apply topically to both feet as needed for dry skin   docusate sodium 100 MG capsule Commonly known as:   COLACE Take 100 mg by mouth 2 (two) times daily.   ELIQUIS 2.5 MG Tabs tablet Generic drug:  apixaban Take 2.5 mg by mouth 2 (two) times daily.   escitalopram 5 MG tablet Commonly known as:  LEXAPRO Take 5 mg by mouth daily.   metoprolol succinate 50 MG 24 hr tablet Commonly known as:  TOPROL-XL Take 50 mg by mouth every morning. Take with or immediately following a meal.   multivitamin with minerals Tabs tablet Take 1 tablet by mouth daily.   MYRBETRIQ 25 MG Tb24 tablet Generic drug:  mirabegron ER Take 25 mg by mouth daily.   NUTRITIONAL SUPPLEMENT PO Offer Magic Cup as bedtime snack related to weight loss   ENSURE Take 237 mLs by mouth 3 (three) times daily between meals.   predniSONE 10 MG tablet Commonly known as:  DELTASONE Take 1 tablet (10 mg total) by mouth daily with breakfast.   pyridostigmine 60 MG tablet Commonly known as:  MESTINON Take 60 mg by mouth 3 (three) times daily.   senna 8.6 MG tablet Commonly known as:  SENOKOT Take 1 tablet by mouth 2 (two) times daily.   simvastatin 10 MG tablet Commonly known as:  ZOCOR Take 10 mg by mouth daily.   Vitamin D3 2000 units Tabs Take 2,000 Units by mouth daily.  No orders of the defined types were placed in this encounter.   Immunization History  Administered Date(s) Administered  . Influenza,inj,Quad PF,36+ Mos 07/05/2015  . Influenza-Unspecified 07/17/2016  . PPD Test 11/09/2015  . Pneumococcal Polysaccharide-23 07/05/2015  . Tdap 03/31/2015    Social History  Substance Use Topics  . Smoking status: Former Games developer  . Smokeless tobacco: Never Used  . Alcohol use No    Review of Systems  DATA OBTAINED: from patient, nurse GENERAL:  no fevers, fatigue, appetite changes SKIN: No itching, rash HEENT: No complaint RESPIRATORY: No cough, wheezing, SOB CARDIAC: No chest pain, palpitations, lower extremity edema  GI: No abdominal pain, No N/V/D or constipation, No heartburn or reflux   GU: No dysuria, frequency or urgency, or incontinence  MUSCULOSKELETAL: No unrelieved bone/joint pain NEUROLOGIC: No headache, dizziness  PSYCHIATRIC: No overt anxiety or sadness  Vitals:   11/12/16 1008  BP: 126/72  Pulse: 80  Resp: 16  Temp: 97.6 F (36.4 C)   Body mass index is 26.6 kg/m. Physical Exam  GENERAL APPEARANCE: Alert, conversant, No acute distress  SKIN: No diaphoresis rash HEENT: Unremarkable RESPIRATORY: Breathing is even, unlabored. Lung sounds are clear   CARDIOVASCULAR: Heart RRR no murmurs, rubs or gallops. No peripheral edema  GASTROINTESTINAL: Abdomen is soft, non-tender, not distended w/ normal bowel sounds.  GENITOURINARY: Bladder non tender, not distended  MUSCULOSKELETAL: No abnormal joints or musculature NEUROLOGIC: Cranial nerves 2-12 grossly intact. Moves all extremities PSYCHIATRIC: Mood and affect appropriate to situation, no behavioral issues  Patient Active Problem List   Diagnosis Date Noted  . Depression 08/22/2016  . Vitamin D deficiency 07/11/2016  . Encounter for family conference with patient present 12/14/2015  . Frequent falls 11/05/2015  . Candida UTI 10/01/2015  . FTT (failure to thrive) in adult 09/25/2015  . Neurogenic bladder 09/25/2015  . Inflammatory arthritis 08/21/2015  . 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 Bacteremia due to Klebsiella pneumoniae 08/07/2015  . Sepsis due to Klebsiella pneumoniae (HCC) 08/07/2015  . DNR (do not resuscitate) 08/06/2015  . Septic shock (HCC) 08/03/2015  . Hypotension 08/03/2015  . Leukocytosis 08/03/2015  . Catheter-associated urinary tract infection (HCC) 08/03/2015  . Palliative care encounter   . Acute renal failure superimposed on stage 3 chronic kidney disease (HCC) 07/13/2015  . Myasthenia gravis (HCC) 07/13/2015  . Acute encephalopathy 07/04/2015  . Fecal impaction (HCC) 07/04/2015  . Dementia 07/04/2015  . Pressure ulcer 07/04/2015  . AP (abdominal pain)   . Hypoxia   .  Altered mental status   . Consciousness loss of (HCC) 05/29/2015  . Vertigo, central origin 05/29/2015  . Dizziness and giddiness 05/29/2015  . Weakness 05/29/2015  . Cerebral brain hemorrhage (HCC) 05/29/2015  . TIA (transient ischemic attack) 05/29/2015  . Proximal limb muscle weakness 05/29/2015  . Ataxia 05/29/2015  . Syncope 03/31/2015  . CKD (chronic kidney disease), stage III 03/31/2015  . HTN (hypertension) 03/31/2015  . AF (atrial fibrillation) (HCC) 03/31/2015  . Dyslipidemia 03/31/2015    CMP     Component Value Date/Time   NA 144 08/25/2016   K 4.2 08/25/2016   CL 113 (H) 11/09/2015 0710   CO2 24 11/09/2015 0710   GLUCOSE 106 (H) 11/09/2015 0710   BUN 13 08/25/2016   CREATININE 0.9 08/25/2016   CREATININE 0.76 11/09/2015 0710   CALCIUM 8.4 (L) 11/09/2015 0710   PROT 5.1 (L) 11/06/2015 0330   PROT 7.7 05/29/2015 1319   ALBUMIN 2.5 (L) 11/06/2015 0330   ALBUMIN 4.0  05/29/2015 1319   AST 17 08/25/2016   ALT 22 08/25/2016   ALKPHOS 73 08/25/2016   BILITOT 0.8 11/06/2015 0330   BILITOT 1.2 05/29/2015 1319   GFRNONAA >60 11/09/2015 0710   GFRAA >60 11/09/2015 0710    Recent Labs  04/06/16 08/25/16  NA 144 144  K 4.4 4.2  BUN 16 13  CREATININE 1.0 0.9    Recent Labs  08/25/16  AST 17  ALT 22  ALKPHOS 73    Recent Labs  04/06/16 08/25/16  WBC 4.5 9.0  HGB 13.5 13.7  HCT 43 44  PLT 181 308    Recent Labs  01/28/16 02/19/16 08/25/16  CHOL 155 153 144  LDLCALC 78 65 64  TRIG 177* 198* 156   No results found for: MICROALBUR Lab Results  Component Value Date   TSH 0.82 08/25/2016   Lab Results  Component Value Date   HGBA1C 6.4 08/25/2016   Lab Results  Component Value Date   CHOL 144 08/25/2016   HDL 48 08/25/2016   LDLCALC 64 08/25/2016   TRIG 156 08/25/2016   CHOLHDL 9.3 01/20/2015    Significant Diagnostic Results in last 30 days:  No results found.  Assessment and Plan  Myasthenia gravis (HCC) Chronic and stable; cont  mestinon 30 mg TID  Dyslipidemia LDL 65, HDL 48, very well controlled;plan to cont zocor 10 mg daily  Dementia Mild with no declines; pt doing well, good support system;cont supportive care  Depression Doing very well on lexapro 5 mg daily ;cont current med    Thurston Hole D. Lyn Hollingshead, MD

## 2016-11-18 ENCOUNTER — Non-Acute Institutional Stay (SKILLED_NURSING_FACILITY): Payer: Medicare Other | Admitting: Internal Medicine

## 2016-11-18 ENCOUNTER — Encounter: Payer: Self-pay | Admitting: Internal Medicine

## 2016-11-18 DIAGNOSIS — Z20828 Contact with and (suspected) exposure to other viral communicable diseases: Secondary | ICD-10-CM | POA: Diagnosis not present

## 2016-11-24 DIAGNOSIS — R1312 Dysphagia, oropharyngeal phase: Secondary | ICD-10-CM | POA: Diagnosis not present

## 2016-11-24 DIAGNOSIS — M6281 Muscle weakness (generalized): Secondary | ICD-10-CM | POA: Diagnosis not present

## 2016-11-24 DIAGNOSIS — R338 Other retention of urine: Secondary | ICD-10-CM | POA: Diagnosis not present

## 2016-11-24 DIAGNOSIS — G7000 Myasthenia gravis without (acute) exacerbation: Secondary | ICD-10-CM | POA: Diagnosis not present

## 2016-11-26 DIAGNOSIS — M6281 Muscle weakness (generalized): Secondary | ICD-10-CM | POA: Diagnosis not present

## 2016-11-26 DIAGNOSIS — G7000 Myasthenia gravis without (acute) exacerbation: Secondary | ICD-10-CM | POA: Diagnosis not present

## 2016-11-26 DIAGNOSIS — R1312 Dysphagia, oropharyngeal phase: Secondary | ICD-10-CM | POA: Diagnosis not present

## 2016-11-27 DIAGNOSIS — M6281 Muscle weakness (generalized): Secondary | ICD-10-CM | POA: Diagnosis not present

## 2016-11-27 DIAGNOSIS — G7000 Myasthenia gravis without (acute) exacerbation: Secondary | ICD-10-CM | POA: Diagnosis not present

## 2016-11-27 DIAGNOSIS — R1312 Dysphagia, oropharyngeal phase: Secondary | ICD-10-CM | POA: Diagnosis not present

## 2016-11-28 ENCOUNTER — Encounter: Payer: Self-pay | Admitting: Internal Medicine

## 2016-11-28 DIAGNOSIS — R1312 Dysphagia, oropharyngeal phase: Secondary | ICD-10-CM | POA: Diagnosis not present

## 2016-11-28 DIAGNOSIS — M6281 Muscle weakness (generalized): Secondary | ICD-10-CM | POA: Diagnosis not present

## 2016-11-28 DIAGNOSIS — G7000 Myasthenia gravis without (acute) exacerbation: Secondary | ICD-10-CM | POA: Diagnosis not present

## 2016-11-28 NOTE — Assessment & Plan Note (Signed)
Mild with no declines; pt doing well, good support system;cont supportive care

## 2016-11-28 NOTE — Assessment & Plan Note (Signed)
LDL 65, HDL 48, very well controlled;plan to cont zocor 10 mg daily

## 2016-11-28 NOTE — Assessment & Plan Note (Signed)
Chronic and stable; cont mestinon 30 mg TID

## 2016-11-28 NOTE — Assessment & Plan Note (Signed)
Doing very well on lexapro 5 mg daily ;cont current med

## 2016-11-29 DIAGNOSIS — M6281 Muscle weakness (generalized): Secondary | ICD-10-CM | POA: Diagnosis not present

## 2016-11-29 DIAGNOSIS — R1312 Dysphagia, oropharyngeal phase: Secondary | ICD-10-CM | POA: Diagnosis not present

## 2016-11-29 DIAGNOSIS — G7000 Myasthenia gravis without (acute) exacerbation: Secondary | ICD-10-CM | POA: Diagnosis not present

## 2016-11-30 DIAGNOSIS — M6281 Muscle weakness (generalized): Secondary | ICD-10-CM | POA: Diagnosis not present

## 2016-11-30 DIAGNOSIS — R1312 Dysphagia, oropharyngeal phase: Secondary | ICD-10-CM | POA: Diagnosis not present

## 2016-11-30 DIAGNOSIS — G7000 Myasthenia gravis without (acute) exacerbation: Secondary | ICD-10-CM | POA: Diagnosis not present

## 2016-12-01 DIAGNOSIS — M6281 Muscle weakness (generalized): Secondary | ICD-10-CM | POA: Diagnosis not present

## 2016-12-01 DIAGNOSIS — G7000 Myasthenia gravis without (acute) exacerbation: Secondary | ICD-10-CM | POA: Diagnosis not present

## 2016-12-01 DIAGNOSIS — R1312 Dysphagia, oropharyngeal phase: Secondary | ICD-10-CM | POA: Diagnosis not present

## 2016-12-02 DIAGNOSIS — R1312 Dysphagia, oropharyngeal phase: Secondary | ICD-10-CM | POA: Diagnosis not present

## 2016-12-02 DIAGNOSIS — G7000 Myasthenia gravis without (acute) exacerbation: Secondary | ICD-10-CM | POA: Diagnosis not present

## 2016-12-02 DIAGNOSIS — M6281 Muscle weakness (generalized): Secondary | ICD-10-CM | POA: Diagnosis not present

## 2016-12-03 DIAGNOSIS — R1312 Dysphagia, oropharyngeal phase: Secondary | ICD-10-CM | POA: Diagnosis not present

## 2016-12-03 DIAGNOSIS — M6281 Muscle weakness (generalized): Secondary | ICD-10-CM | POA: Diagnosis not present

## 2016-12-03 DIAGNOSIS — G7000 Myasthenia gravis without (acute) exacerbation: Secondary | ICD-10-CM | POA: Diagnosis not present

## 2016-12-04 DIAGNOSIS — R1312 Dysphagia, oropharyngeal phase: Secondary | ICD-10-CM | POA: Diagnosis not present

## 2016-12-04 DIAGNOSIS — M6281 Muscle weakness (generalized): Secondary | ICD-10-CM | POA: Diagnosis not present

## 2016-12-04 DIAGNOSIS — G7000 Myasthenia gravis without (acute) exacerbation: Secondary | ICD-10-CM | POA: Diagnosis not present

## 2016-12-06 DIAGNOSIS — R1312 Dysphagia, oropharyngeal phase: Secondary | ICD-10-CM | POA: Diagnosis not present

## 2016-12-06 DIAGNOSIS — M6281 Muscle weakness (generalized): Secondary | ICD-10-CM | POA: Diagnosis not present

## 2016-12-06 DIAGNOSIS — G7000 Myasthenia gravis without (acute) exacerbation: Secondary | ICD-10-CM | POA: Diagnosis not present

## 2016-12-07 DIAGNOSIS — M6281 Muscle weakness (generalized): Secondary | ICD-10-CM | POA: Diagnosis not present

## 2016-12-07 DIAGNOSIS — R1312 Dysphagia, oropharyngeal phase: Secondary | ICD-10-CM | POA: Diagnosis not present

## 2016-12-07 DIAGNOSIS — G7000 Myasthenia gravis without (acute) exacerbation: Secondary | ICD-10-CM | POA: Diagnosis not present

## 2016-12-08 DIAGNOSIS — R1312 Dysphagia, oropharyngeal phase: Secondary | ICD-10-CM | POA: Diagnosis not present

## 2016-12-08 DIAGNOSIS — G7000 Myasthenia gravis without (acute) exacerbation: Secondary | ICD-10-CM | POA: Diagnosis not present

## 2016-12-08 DIAGNOSIS — M6281 Muscle weakness (generalized): Secondary | ICD-10-CM | POA: Diagnosis not present

## 2016-12-09 DIAGNOSIS — M6281 Muscle weakness (generalized): Secondary | ICD-10-CM | POA: Diagnosis not present

## 2016-12-09 DIAGNOSIS — G7000 Myasthenia gravis without (acute) exacerbation: Secondary | ICD-10-CM | POA: Diagnosis not present

## 2016-12-09 DIAGNOSIS — R1312 Dysphagia, oropharyngeal phase: Secondary | ICD-10-CM | POA: Diagnosis not present

## 2016-12-10 DIAGNOSIS — R1312 Dysphagia, oropharyngeal phase: Secondary | ICD-10-CM | POA: Diagnosis not present

## 2016-12-10 DIAGNOSIS — G7000 Myasthenia gravis without (acute) exacerbation: Secondary | ICD-10-CM | POA: Diagnosis not present

## 2016-12-10 DIAGNOSIS — M6281 Muscle weakness (generalized): Secondary | ICD-10-CM | POA: Diagnosis not present

## 2016-12-11 ENCOUNTER — Encounter: Payer: Self-pay | Admitting: Internal Medicine

## 2016-12-11 ENCOUNTER — Non-Acute Institutional Stay (SKILLED_NURSING_FACILITY): Payer: Medicare Other | Admitting: Internal Medicine

## 2016-12-11 DIAGNOSIS — I4891 Unspecified atrial fibrillation: Secondary | ICD-10-CM | POA: Diagnosis not present

## 2016-12-11 DIAGNOSIS — N183 Chronic kidney disease, stage 3 unspecified: Secondary | ICD-10-CM

## 2016-12-11 DIAGNOSIS — R1312 Dysphagia, oropharyngeal phase: Secondary | ICD-10-CM | POA: Diagnosis not present

## 2016-12-11 DIAGNOSIS — M6281 Muscle weakness (generalized): Secondary | ICD-10-CM | POA: Diagnosis not present

## 2016-12-11 DIAGNOSIS — R6889 Other general symptoms and signs: Secondary | ICD-10-CM | POA: Diagnosis not present

## 2016-12-11 DIAGNOSIS — G7000 Myasthenia gravis without (acute) exacerbation: Secondary | ICD-10-CM | POA: Diagnosis not present

## 2016-12-11 NOTE — Progress Notes (Signed)
Location:  [facility] Room Number: 405P Place of Service:  SNF [phone])  Merrilee Seashore, MD  Patient Care Team: Margit Hanks, MD as PCP - General (Internal Medicine)  Extended Emergency Contact Information Primary Emergency Contact: Gaertner,David & Neoma [place] Home Phone: [phone] Mobile Phone: [phone] Relation: Son Secondary Emergency Contact: Richardean Chimera [place] Home Phone: [phone] Relation: None    Allergies: Ceftin [cefuroxime]  Chief Complaint  Patient presents with  . Medical Management of Chronic Issues    Routine Visit    HPI: Patient is 83 y.o. male who is being seen for routine issues of abnormal FEES, AF and CKD3.  Past Medical History:  Diagnosis Date  . A-fib (HCC)   . Anorexia   . Aortic stenosis   . Arthritis   . Atrial fibrillation (HCC)   . Bacteremia due to Klebsiella pneumoniae 08/07/2015  . Candida UTI 10/01/2015  . Cerebral brain hemorrhage (HCC) 05/29/2015  . Chronic kidney disease   . Dementia    short term memory loss  . Encephalopathy acute   . Gangrene (HCC)    perineal  . High cholesterol   . History of TIAs   . Hyperlipidemia   . Hypertension   . Major depressive disorder   . Multiple falls   . Myasthenia gravis (HCC)   . Sepsis due to Klebsiella pneumoniae (HCC) 08/07/2015  . Urinary incontinence   . Vertigo   . Vitamin D deficiency     Past Surgical History:  Procedure Laterality Date  . BACK SURGERY  2006  . CERVICAL LAMINECTOMY  2002 ?  [name] CYSTOSCOPY N/A 06/26/2015   Procedure: CYSTOSCOPY;  Surgeon: Malen Gauze, MD;  Location: [facility];  Service: Urology;  Laterality: N/A;  . HERNIA REPAIR     1970s  . INSERTION OF SUPRAPUBIC CATHETER N/A 06/26/2015   Procedure: INSERTION OF SUPRAPUBIC CATHETER;  Surgeon: Malen Gauze, MD;  Location: [facility];  Service: Urology;  Laterality: N/A;    Allergies as of 12/11/2016      Reactions   Ceftin [cefuroxime] Diarrhea   Severe diarrhea      Medication List       Accurate as of 12/11/16 11:59 PM. Always use your most recent med list.          acetaminophen 500 MG tablet Commonly known as:  TYLENOL Take 500 mg by mouth every 6 (six) hours as needed for mild pain.   cetaphil cream Apply topically to both feet as needed for dry skin   docusate sodium 100 MG capsule Commonly known as:  COLACE Take 100 mg by mouth 2 (two) times daily.   ELIQUIS 2.5 MG Tabs tablet Generic drug:  apixaban Take 2.5 mg by mouth 2 (two) times daily.   escitalopram 5 MG tablet Commonly known as:  LEXAPRO Take 5 mg by mouth daily.   metoprolol succinate 50 MG 24 hr tablet Commonly known as:  TOPROL-XL Take 50 mg by mouth every morning. Take with or immediately following a meal.   multivitamin with minerals Tabs tablet Take 1 tablet by mouth daily.   MYRBETRIQ 25 MG Tb24 tablet Generic drug:  mirabegron ER Take 25 mg by mouth daily.   NUTRITIONAL SUPPLEMENT PO Offer Magic Cup as bedtime snack related to weight loss   ENSURE Take 237 mLs by mouth 3 (three) times daily between meals.   predniSONE 10 MG tablet Commonly known as:  DELTASONE  Take 1 tablet (10 mg total) by mouth daily with breakfast.   pyridostigmine 60 MG tablet Commonly known as:  MESTINON Take 60 mg by mouth 3 (three) times daily.   senna 8.6 MG tablet Commonly known as:  SENOKOT Take 1 tablet by mouth 2 (two) times daily.   simvastatin 10 MG tablet Commonly known as:  ZOCOR Take 10 mg by mouth daily.   Vitamin D3 2000 units Tabs Take 2,000 Units by mouth daily.       No orders of the defined types were placed in this encounter.   Immunization History  Administered Date(s) Administered  . Influenza,inj,Quad PF,36+ Mos 07/05/2015  . Influenza-Unspecified 07/17/2016  . PPD Test 11/09/2015  . Pneumococcal Polysaccharide-23 07/05/2015  . Tdap 03/31/2015    Social History  Substance Use  Topics  . Smoking status: Former Games developer  . Smokeless tobacco: Never Used  . Alcohol use No    Review of Systems  DATA OBTAINED: from patient, nurse GENERAL:  no fevers, fatigue, appetite changes SKIN: No itching, rash HEENT: No complaint RESPIRATORY: No cough, wheezing, SOB CARDIAC: No chest pain, palpitations, lower extremity edema  GI: No abdominal pain, No N/V/D or constipation, No heartburn or reflux  GU: No dysuria, frequency or urgency, or incontinence  MUSCULOSKELETAL: No unrelieved bone/joint pain NEUROLOGIC: No headache, dizziness  PSYCHIATRIC: No overt anxiety or sadness  Vitals:   12/11/16 0915  BP: 138/83  Pulse: 62  Resp: 20  Temp: 97.1 F (36.2 C)   Body mass index is 26.7 kg/m. Physical Exam  GENERAL APPEARANCE: Alert, conversant, No acute distress  SKIN: No diaphoresis rash HEENT: Unremarkable RESPIRATORY: Breathing is even, unlabored. Lung sounds are clear   CARDIOVASCULAR: Heart RRR no murmurs, rubs or gallops. No peripheral edema  GASTROINTESTINAL: Abdomen is soft, non-tender, not distended w/ normal bowel sounds.  GENITOURINARY: Bladder non tender, not distended  MUSCULOSKELETAL: No abnormal joints or musculature NEUROLOGIC: Cranial nerves 2-12 grossly intact. Moves all extremities PSYCHIATRIC: Mood and affect appropriate to situation with dementia, no behavioral issues  Patient Active Problem List   Diagnosis Date Noted  . Abnormality on screening test 12/26/2016  . Current use of long term anticoagulation - Eliquis for A fib 12/16/2016  . Depression 08/22/2016  . Vitamin D deficiency 07/11/2016  . Encounter for family conference with patient present 12/14/2015  . Frequent falls 11/05/2015  . FTT (failure to thrive) in adult 09/25/2015  . Neurogenic bladder 09/25/2015  . DNR (do not resuscitate) 08/06/2015  . Catheter-associated urinary tract infection (HCC) 08/03/2015  . Myasthenia gravis (HCC) 07/13/2015  . Dementia 07/04/2015  .  Pressure ulcer 07/04/2015  . Dizziness and giddiness 05/29/2015  . Weakness 05/29/2015  . Proximal limb muscle weakness 05/29/2015  . Ataxia 05/29/2015  . CKD (chronic kidney disease), stage III 03/31/2015  . HTN (hypertension) 03/31/2015  . AF (atrial fibrillation) (HCC) 03/31/2015  . Dyslipidemia 03/31/2015    CMP     Component Value Date/Time   NA 144 08/25/2016   K 4.2 08/25/2016   CL 113 (H) 11/09/2015 0710   CO2 24 11/09/2015 0710   GLUCOSE 106 (H) 11/09/2015 0710   BUN 13 08/25/2016   CREATININE 0.9 08/25/2016   CREATININE 0.76 11/09/2015 0710   CALCIUM 8.4 (L) 11/09/2015 0710   PROT 5.1 (L) 11/06/2015 0330   PROT 7.7 05/29/2015 1319   ALBUMIN 2.5 (L) 11/06/2015 0330   ALBUMIN 4.0 05/29/2015 1319   AST 17 08/25/2016   ALT 22 08/25/2016   ALKPHOS  73 08/25/2016   BILITOT 0.8 11/06/2015 0330   BILITOT 1.2 05/29/2015 1319   GFRNONAA >60 11/09/2015 0710   GFRAA >60 11/09/2015 0710    Recent Labs  04/06/16 08/25/16  NA 144 144  K 4.4 4.2  BUN 16 13  CREATININE 1.0 0.9    Recent Labs  08/25/16  AST 17  ALT 22  ALKPHOS 73    Recent Labs  04/06/16 08/25/16  WBC 4.5 9.0  HGB 13.5 13.7  HCT 43 44  PLT 181 308    Recent Labs  01/28/16 02/19/16 08/25/16  CHOL 155 153 144  LDLCALC 78 65 64  TRIG 177* 198* 156   No results found for: MICROALBUR Lab Results  Component Value Date   TSH 0.82 08/25/2016   Lab Results  Component Value Date   HGBA1C 6.4 08/25/2016   Lab Results  Component Value Date   CHOL 144 08/25/2016   HDL 48 08/25/2016   LDLCALC 64 08/25/2016   TRIG 156 08/25/2016   CHOLHDL 9.3 01/20/2015    Significant Diagnostic Results in last 30 days:  No results found.  Assessment and Plan  Abnormality on screening test Was made aware by ST that during pt's FEES there was no aspiration but espohagus looked inflamed; rec refer to GI which has been done  AF (atrial fibrillation) (HCC) No reported problems on eliquis 2.5 mg BID as  prophylaxis and toprol XL 50 mg daily;plan cont current regimen  CKD (chronic kidney disease), stage III BUN/Cr 13/0.9 which is stable; no recent GFR; plan to monitor     Tegh Franek D. Lyn HollingsheadAlexander, MD

## 2016-12-13 DIAGNOSIS — M6281 Muscle weakness (generalized): Secondary | ICD-10-CM | POA: Diagnosis not present

## 2016-12-13 DIAGNOSIS — G7000 Myasthenia gravis without (acute) exacerbation: Secondary | ICD-10-CM | POA: Diagnosis not present

## 2016-12-13 DIAGNOSIS — R1312 Dysphagia, oropharyngeal phase: Secondary | ICD-10-CM | POA: Diagnosis not present

## 2016-12-14 ENCOUNTER — Encounter: Payer: Self-pay | Admitting: Internal Medicine

## 2016-12-14 DIAGNOSIS — M6281 Muscle weakness (generalized): Secondary | ICD-10-CM | POA: Diagnosis not present

## 2016-12-14 DIAGNOSIS — G7000 Myasthenia gravis without (acute) exacerbation: Secondary | ICD-10-CM | POA: Diagnosis not present

## 2016-12-14 DIAGNOSIS — R1312 Dysphagia, oropharyngeal phase: Secondary | ICD-10-CM | POA: Diagnosis not present

## 2016-12-16 ENCOUNTER — Ambulatory Visit (INDEPENDENT_AMBULATORY_CARE_PROVIDER_SITE_OTHER): Payer: Medicare Other | Admitting: Internal Medicine

## 2016-12-16 ENCOUNTER — Encounter: Payer: Self-pay | Admitting: Internal Medicine

## 2016-12-16 VITALS — BP 110/70 | HR 64 | Ht 70.0 in

## 2016-12-16 DIAGNOSIS — R1310 Dysphagia, unspecified: Secondary | ICD-10-CM | POA: Diagnosis not present

## 2016-12-16 DIAGNOSIS — G7000 Myasthenia gravis without (acute) exacerbation: Secondary | ICD-10-CM | POA: Diagnosis not present

## 2016-12-16 DIAGNOSIS — J029 Acute pharyngitis, unspecified: Secondary | ICD-10-CM

## 2016-12-16 DIAGNOSIS — R1312 Dysphagia, oropharyngeal phase: Secondary | ICD-10-CM | POA: Diagnosis not present

## 2016-12-16 DIAGNOSIS — M6281 Muscle weakness (generalized): Secondary | ICD-10-CM | POA: Diagnosis not present

## 2016-12-16 DIAGNOSIS — Z7901 Long term (current) use of anticoagulants: Secondary | ICD-10-CM | POA: Insufficient documentation

## 2016-12-16 NOTE — Patient Instructions (Addendum)
   You have been scheduled for a Barium Esophogram at [facility] (1st floor of the hospital) on 12/30/16 at 10:30AM. Please arrive 15 minutes prior to your appointment for registration. Make certain not to have anything to eat or drink 6 hours prior to your test. If you need to reschedule for any reason, please contact radiology at [phone] to do so. __________________________________________________________________ A barium swallow is an examination that concentrates on views of the esophagus. This tends to be a double contrast exam (barium and two liquids which, when combined, create a gas to distend the wall of the oesophagus) or single contrast (non-ionic iodine based). The study is usually tailored to your symptoms so a good history is essential. Attention is paid during the study to the form, structure and configuration of the esophagus, looking for functional disorders (such as aspiration, dysphagia, achalasia, motility and reflux) EXAMINATION You may be asked to change into a gown, depending on the type of swallow being performed. A radiologist and radiographer will perform the procedure. The radiologist will advise you of the type of contrast selected for your procedure and direct you during the exam. You will be asked to stand, sit or lie in several different positions and to hold a small amount of fluid in your mouth before being asked to swallow while the imaging is performed .In some instances you may be asked to swallow barium coated marshmallows to assess the motility of a solid food bolus. The exam can be recorded as a digital or video fluoroscopy procedure. POST PROCEDURE It will take 1-2 days for the barium to pass through your system. To facilitate this, it is important, unless otherwise directed, to increase your fluids for the next 24-48hrs and to resume your normal diet.  This test typically takes about 30 minutes to  perform. __________________________________________________________________________________   We will call you with results and plans.     I appreciate the opportunity to care for you. Stan Headarl Gessner, MD, [facility]

## 2016-12-16 NOTE — Progress Notes (Signed)
Seth [name] 83 y.o. 24-Mar-1925 [id number]  Assessment & Plan:   Encounter Diagnoses  Name Primary?  [name]. Dysphagia, unspecified type Yes  . Pharyngeal erythema - FEES study   . Current use of long term anticoagulation - Eliquis for A fib    I explained to the family and the patient though mostly to the family that erythema in the posterior pharynx is associated with but not conclusive of reflux disease. It's quite possible he has esophageal dysmotility with his age. Fortunately he has no signs of aspiration. I'm going to having do a barium swallow with tablet. It may be reasonable but this man on a PPI to reduce the risk of ulcers given the medications he is on, and potentially treat reflux if that is a problem. If he needs upper endoscopy and dilation we can consider that though he is elderly and on an anticoagulant that would need to be stopped and I would not proceed quickly to that unless there was some very good indication.  I appreciate the opportunity to care for this patient. CC: Seth SeashoreAnne Alexander, MD    Subjective:   Chief Complaint: Choking episodes abnormal speech pathology study  HPI The patient is an 83 year old man resident of [facility], who has been having intermittent episodes of pain when he eats. His daughter-in-law describes that he'll be eating and he'll cough and gag mostly gag. He does not have problems suggestive of aspiration. He cannot provide much history he does not recall having as he is demented. Been no unintentional weight loss. He does have myasthenia gravis. He is on treatment for that which seems to be going well. He is unable to walk. He cannot stand he is in a wheelchair a chair or the bed. It sounds like most of the time he is upright or semiupright.  As of these choking episodes or gagging episodes if you will speech pathology was brought in to see him, particularly in light of his myasthenia gravis and the possibility of dysphagia  related to that. He had FEES that demonstrated some pharyngeal erythema. For port not yet available. No signs of aspiration. The speech pathologist suggested this could be related to reflux. GI consult was requested. Allergies  Allergen Reactions  . Ceftin [Cefuroxime] Diarrhea    Severe diarrhea   Current Meds  Medication Sig  . acetaminophen (TYLENOL) 500 MG tablet Take 500 mg by mouth every 6 (six) hours as needed for mild pain.   [name]. apixaban (ELIQUIS) 2.5 MG TABS tablet Take 2.5 mg by mouth 2 (two) times daily.  . cetaphil (CETAPHIL) cream Apply topically to both feet as needed for dry skin  . Cholecalciferol (VITAMIN D3) 2000 UNITS TABS Take 2,000 Units by mouth daily.   [name]. docusate sodium (COLACE) 100 MG capsule Take 100 mg by mouth 2 (two) times daily.   [name]. ENSURE (ENSURE) Take 237 mLs by mouth 3 (three) times daily between meals.  [name]. escitalopram (LEXAPRO) 5 MG tablet Take 5 mg by mouth daily.  . metoprolol succinate (TOPROL-XL) 50 MG 24 hr tablet Take 50 mg by mouth every morning. Take with or immediately following a meal.  . Multiple Vitamin (MULTIVITAMIN WITH MINERALS) TABS tablet Take 1 tablet by mouth daily.  [name]. MYRBETRIQ 25 MG TB24 tablet Take 25 mg by mouth daily.  . Nutritional Supplements (NUTRITIONAL SUPPLEMENT PO) Offer Magic Cup as bedtime snack related to weight loss  . predniSONE (DELTASONE) 10 MG tablet Take 1 tablet (10 mg  total) by mouth daily with breakfast.  . pyridostigmine (MESTINON) 60 MG tablet Take 60 mg by mouth 3 (three) times daily.  [name] senna (SENOKOT) 8.6 MG tablet Take 1 tablet by mouth 2 (two) times daily.   . simvastatin (ZOCOR) 10 MG tablet Take 10 mg by mouth daily.    Past Medical History:  Diagnosis Date  . A-fib (HCC)   . Anorexia   . Aortic stenosis   . Arthritis   . Atrial fibrillation (HCC)   . Chronic kidney disease   . Dementia    short term memory loss  . Encephalopathy acute   . Gangrene (HCC)    perineal  . High cholesterol   .  History of TIAs   . Hyperlipidemia   . Hypertension   . Major depressive disorder   . Multiple falls   . Myasthenia gravis (HCC)   . Urinary incontinence   . Vertigo   . Vitamin D deficiency    Past Surgical History:  Procedure Laterality Date  . BACK SURGERY  2006  . CERVICAL LAMINECTOMY  2002 ?  [name] CYSTOSCOPY N/A 06/26/2015   Procedure: CYSTOSCOPY;  Surgeon: Malen Gauze, MD;  Location: [facility];  Service: Urology;  Laterality: N/A;  . HERNIA REPAIR     1970s  . INSERTION OF SUPRAPUBIC CATHETER N/A 06/26/2015   Procedure: INSERTION OF SUPRAPUBIC CATHETER;  Surgeon: Malen Gauze, MD;  Location: [facility];  Service: Urology;  Laterality: N/A;   Social History   Social History  . Marital status: Widowed    Spouse name: N/A  . Number of children: 4  . Years of education: 39   Occupational History  . Retired     Social History Main Topics  . Smoking status: Former Games developer  . Smokeless tobacco: Never Used  . Alcohol use No  . Drug use: No  . Sexual activity: No   Other Topics Concern  . None   Social History Narrative   Lives at :   [facility]   Address: [address] [place], [place] [address]   Phone: [phone]      Caffeine use: Soda rare   Has 4 children   Retired from [employer]   family history includes Hyperlipidemia in his daughter; Hypertension in his daughter; Prostate cancer in his brother; Stroke in his father and mother.  Review of Systems Memory loss confusion urinary leakage he has a suprapubic catheter in he's had problems with fecal incontinence and stool impactions. All other review of systems are negative.  Objective:   Physical Exam @BP  110/70   Pulse 64   Ht 5\' 10"  (1.778 m) @  General:  Elderly wm in wheelchair and in no acute distress Eyes:  anicteric. ENT:   Mouth and posterior pharynx free of lesions. Intact teeth and some partial dentures Neck:   supple w/o thyromegaly or mass.   Lungs: Clear to auscultation bilaterally. Heart:  S1S2, no rubs, murmurs, gallops. Abdomen:  soft, non-tender,  Lymph:  no cervical or supraclavicular adenopathy. Extremities:   no edema, cyanosis or clubbing   Data Reviewed: Spoken to the daughter-in-law who had a preliminary report of FEES Reviewed PCP SNF visit notes Labs in EMR

## 2016-12-22 DIAGNOSIS — R339 Retention of urine, unspecified: Secondary | ICD-10-CM | POA: Diagnosis not present

## 2016-12-26 ENCOUNTER — Encounter: Payer: Self-pay | Admitting: Internal Medicine

## 2016-12-26 DIAGNOSIS — R6889 Other general symptoms and signs: Secondary | ICD-10-CM | POA: Insufficient documentation

## 2016-12-26 NOTE — Assessment & Plan Note (Signed)
BUN/Cr 13/0.9 which is stable; no recent GFR; plan to monitor

## 2016-12-26 NOTE — Assessment & Plan Note (Signed)
No reported problems on eliquis 2.5 mg BID as prophylaxis and toprol XL 50 mg daily;plan cont current regimen

## 2016-12-26 NOTE — Progress Notes (Signed)
Location:    [facility]   Place of Service:   [facility]  Seth SeashoreAnne Vaudie Engebretsen, MD  Patient Care Team: Margit HanksAnne D Nicky Milhouse, MD as PCP - General (Internal Medicine)  Extended Emergency Contact Information Primary Emergency Contact: Hershman,David & Neoma LamingEllen  [place] Home Phone: [phone] Mobile Phone: [phone] Relation: Son Secondary Emergency Contact: Richardean ChimeraSmoak,Mark  [place] Home Phone: [phone] Relation: None    Allergies: Ceftin [cefuroxime]  Chief Complaint  Patient presents with  . Acute Visit    HPI: Patient is 83 y.o. male who is being seen acutely because an outbreak of Influenza A per CDC guidelines was recognized on 11/18/2016. Pt has no c/o flu like symptoms;therefore pt will need to be prophylaxed with Tamiflu for a minimum of 14 days per CDC protocol.  Past Medical History:  Diagnosis Date  . A-fib (HCC)   . Anorexia   . Aortic stenosis   . Arthritis   . Atrial fibrillation (HCC)   . Bacteremia due to Klebsiella pneumoniae 08/07/2015  . Candida UTI 10/01/2015  . Cerebral brain hemorrhage (HCC) 05/29/2015  . Chronic kidney disease   . Dementia    short term memory loss  . Encephalopathy acute   . Gangrene (HCC)    perineal  . High cholesterol   . History of TIAs   . Hyperlipidemia   . Hypertension   . Major depressive disorder   . Multiple falls   . Myasthenia gravis (HCC)   . Sepsis due to Klebsiella pneumoniae (HCC) 08/07/2015  . Urinary incontinence   . Vertigo   . Vitamin D deficiency     Past Surgical History:  Procedure Laterality Date  . BACK SURGERY  2006  . CERVICAL LAMINECTOMY  2002 ?  [name]. CYSTOSCOPY N/A 06/26/2015   Procedure: CYSTOSCOPY;  Surgeon: Malen GauzePatrick L McKenzie, MD;  Location: [facility];  Service: Urology;  Laterality: N/A;  . HERNIA REPAIR     1970s  . INSERTION OF SUPRAPUBIC CATHETER N/A 06/26/2015   Procedure: INSERTION OF SUPRAPUBIC CATHETER;  Surgeon: Malen GauzePatrick L  McKenzie, MD;  Location: [facility];  Service: Urology;  Laterality: N/A;    Allergies as of 11/18/2016      Reactions   Ceftin [cefuroxime] Diarrhea   Severe diarrhea      Medication List       Accurate as of 11/18/16 11:59 PM. Always use your most recent med list.          acetaminophen 500 MG tablet Commonly known as:  TYLENOL Take 500 mg by mouth every 6 (six) hours as needed for mild pain.   docusate sodium 100 MG capsule Commonly known as:  COLACE Take 100 mg by mouth 2 (two) times daily.   ELIQUIS 2.5 MG Tabs tablet Generic drug:  apixaban Take 2.5 mg by mouth 2 (two) times daily.   escitalopram 5 MG tablet Commonly known as:  LEXAPRO Take 5 mg by mouth daily.   metoprolol succinate 50 MG 24 hr tablet Commonly known as:  TOPROL-XL Take 50 mg by mouth every morning. Take with or immediately following a meal.   MYRBETRIQ 25 MG Tb24 tablet Generic drug:  mirabegron ER Take 25 mg by mouth daily.   NUTRITIONAL SUPPLEMENT PO Offer Magic Cup as bedtime snack related to weight loss   ENSURE Take 237 mLs by mouth 3 (three) times daily between meals.   predniSONE 10 MG tablet Commonly known as:  DELTASONE Take 1  tablet (10 mg total) by mouth daily with breakfast.   pyridostigmine 60 MG tablet Commonly known as:  MESTINON Take 60 mg by mouth 3 (three) times daily.   senna 8.6 MG tablet Commonly known as:  SENOKOT Take 1 tablet by mouth 2 (two) times daily.   simvastatin 10 MG tablet Commonly known as:  ZOCOR Take 10 mg by mouth daily.   TAB-A-VITE Tabs Take 1 tablet by mouth daily.   Vitamin D3 2000 units Tabs Take 2,000 Units by mouth daily.       No orders of the defined types were placed in this encounter.   Immunization History  Administered Date(s) Administered  . Influenza,inj,Quad PF,36+ Mos 07/05/2015  . Influenza-Unspecified 07/17/2016  . PPD Test 11/09/2015  . Pneumococcal Polysaccharide-23 07/05/2015  . Tdap 03/31/2015    Social  History  Substance Use Topics  . Smoking status: Former Games developer  . Smokeless tobacco: Never Used  . Alcohol use No    Review of Systems  DATA OBTAINED: from patient, nurse GENERAL:  no fevers SKIN: No itching, rash HEENT: no rhinorrhea, congestion, ST or ear pain RESPIRATORY: No cough, wheezing, SOB CARDIAC: No chest pain, palpitations, lower extremity edema  GI: No abdominal pain, No N/V/D or constipation, No heartburn or reflux  MUSCULOSKELETAL: No muscle aches NEUROLOGIC: No headache, dizziness   Vitals:   12/31/16 1205  BP: (!) 144/81  Pulse: 61  Resp: 18  Temp: 97.1 F (36.2 C)   Body mass index is 26.7 kg/m. Physical Exam  GENERAL APPEARANCE: Alert, conversant, No acute distress  SKIN: No diaphoresis rash HEENT: Unremarkable RESPIRATORY: Breathing is even, unlabored. Lung sounds are clear   CARDIOVASCULAR: Heart RRR no murmurs, rubs or gallops. No peripheral edema  GASTROINTESTINAL: Abdomen is soft, non-tender, not distended w/ normal bowel sounds.   NEUROLOGIC: Cranial nerves 2-12 grossly intact PSYCHIATRIC: baseline, no mental status changes  Patient Active Problem List   Diagnosis Date Noted  . Abnormality on screening test 12/26/2016  . Current use of long term anticoagulation - Eliquis for A fib 12/16/2016  . Depression 08/22/2016  . Vitamin D deficiency 07/11/2016  . Encounter for family conference with patient present 12/14/2015  . Frequent falls 11/05/2015  . FTT (failure to thrive) in adult 09/25/2015  . Neurogenic bladder 09/25/2015  . DNR (do not resuscitate) 08/06/2015  . Catheter-associated urinary tract infection (HCC) 08/03/2015  . Myasthenia gravis (HCC) 07/13/2015  . Dementia 07/04/2015  . Pressure ulcer 07/04/2015  . Dizziness and giddiness 05/29/2015  . Weakness 05/29/2015  . Proximal limb muscle weakness 05/29/2015  . Ataxia 05/29/2015  . CKD (chronic kidney disease), stage III 03/31/2015  . HTN (hypertension) 03/31/2015  . AF  (atrial fibrillation) (HCC) 03/31/2015  . Dyslipidemia 03/31/2015    CMP     Component Value Date/Time   NA 144 08/25/2016   K 4.2 08/25/2016   CL 113 (H) 11/09/2015 0710   CO2 24 11/09/2015 0710   GLUCOSE 106 (H) 11/09/2015 0710   BUN 13 08/25/2016   CREATININE 0.9 08/25/2016   CREATININE 0.76 11/09/2015 0710   CALCIUM 8.4 (L) 11/09/2015 0710   PROT 5.1 (L) 11/06/2015 0330   PROT 7.7 05/29/2015 1319   ALBUMIN 2.5 (L) 11/06/2015 0330   ALBUMIN 4.0 05/29/2015 1319   AST 17 08/25/2016   ALT 22 08/25/2016   ALKPHOS 73 08/25/2016   BILITOT 0.8 11/06/2015 0330   BILITOT 1.2 05/29/2015 1319   GFRNONAA >60 11/09/2015 0710   GFRAA >60 11/09/2015 0710  Recent Labs  04/06/16 08/25/16  NA 144 144  K 4.4 4.2  BUN 16 13  CREATININE 1.0 0.9    Recent Labs  08/25/16  AST 17  ALT 22  ALKPHOS 73    Recent Labs  04/06/16 08/25/16  WBC 4.5 9.0  HGB 13.5 13.7  HCT 43 44  PLT 181 308    Recent Labs  01/28/16 02/19/16 08/25/16  CHOL 155 153 144  LDLCALC 78 65 64  TRIG 177* 198* 156   No results found for: MICROALBUR Lab Results  Component Value Date   TSH 0.82 08/25/2016   Lab Results  Component Value Date   HGBA1C 6.4 08/25/2016   Lab Results  Component Value Date   CHOL 144 08/25/2016   HDL 48 08/25/2016   LDLCALC 64 08/25/2016   TRIG 156 08/25/2016   CHOLHDL 9.3 01/20/2015    Significant Diagnostic Results in last 30 days:  No results found.  Assessment and Plan  EXPOSURE TO FLU/ INFLUENZA OUTBREAK AT SNF-   CrCl calculated by me-  64      Dose for 14 days- 75 mg daily Pt will be monitored daily for flu like symptoms                                                                                 Thurston Hole D. Lyn Hollingshead, MD

## 2016-12-26 NOTE — Assessment & Plan Note (Signed)
Was made aware by ST that during pt's FEES there was no aspiration but espohagus looked inflamed; rec refer to GI which has been done

## 2016-12-29 ENCOUNTER — Encounter: Payer: Self-pay | Admitting: Internal Medicine

## 2016-12-29 NOTE — Progress Notes (Signed)
Opened in error; Disregard.

## 2016-12-30 ENCOUNTER — Ambulatory Visit (HOSPITAL_COMMUNITY)
Admission: RE | Admit: 2016-12-30 | Discharge: 2016-12-30 | Disposition: A | Discharge status: Home / Self Care | Payer: Medicare Other | Source: Ambulatory Visit | Attending: Internal Medicine | Admitting: Internal Medicine

## 2016-12-30 DIAGNOSIS — R1310 Dysphagia, unspecified: Secondary | ICD-10-CM

## 2017-01-06 ENCOUNTER — Ambulatory Visit (HOSPITAL_COMMUNITY)
Admission: RE | Admit: 2017-01-06 | Discharge: 2017-01-06 | Disposition: A | Discharge status: Home / Self Care | Payer: Medicare Other | Source: Ambulatory Visit | Attending: Internal Medicine | Admitting: Internal Medicine

## 2017-01-06 ENCOUNTER — Other Ambulatory Visit: Payer: Self-pay | Admitting: Internal Medicine

## 2017-01-06 DIAGNOSIS — R1310 Dysphagia, unspecified: Secondary | ICD-10-CM | POA: Insufficient documentation

## 2017-01-06 DIAGNOSIS — K224 Dyskinesia of esophagus: Secondary | ICD-10-CM | POA: Diagnosis not present

## 2017-01-07 ENCOUNTER — Encounter: Payer: Self-pay | Admitting: Internal Medicine

## 2017-01-08 ENCOUNTER — Other Ambulatory Visit: Payer: Self-pay

## 2017-01-08 MED ORDER — OMEPRAZOLE 20 MG PO TBEC: DELAYED_RELEASE_TABLET | ORAL | 6 refills | Status: DC

## 2017-01-08 NOTE — Progress Notes (Signed)
Ba swallow shows dysmotility Likely cause for tablet hanging up  I suggest Dysphagia 3 diet if not on already Omeprazole 20 mg qd before breakfast  If still having problems then after 2 mos return to me

## 2017-01-11 DIAGNOSIS — F0390 Unspecified dementia without behavioral disturbance: Secondary | ICD-10-CM | POA: Diagnosis not present

## 2017-01-11 DIAGNOSIS — F419 Anxiety disorder, unspecified: Secondary | ICD-10-CM | POA: Diagnosis not present

## 2017-01-11 DIAGNOSIS — F329 Major depressive disorder, single episode, unspecified: Secondary | ICD-10-CM | POA: Diagnosis not present

## 2017-01-12 ENCOUNTER — Non-Acute Institutional Stay (SKILLED_NURSING_FACILITY): Payer: Medicare Other | Admitting: Internal Medicine

## 2017-01-12 ENCOUNTER — Encounter: Payer: Self-pay | Admitting: Internal Medicine

## 2017-01-12 DIAGNOSIS — E785 Hyperlipidemia, unspecified: Secondary | ICD-10-CM

## 2017-01-12 DIAGNOSIS — F329 Major depressive disorder, single episode, unspecified: Secondary | ICD-10-CM | POA: Diagnosis not present

## 2017-01-12 DIAGNOSIS — F0280 Dementia in other diseases classified elsewhere without behavioral disturbance: Secondary | ICD-10-CM

## 2017-01-12 DIAGNOSIS — G301 Alzheimer's disease with late onset: Secondary | ICD-10-CM | POA: Diagnosis not present

## 2017-01-12 DIAGNOSIS — F32A Depression, unspecified: Secondary | ICD-10-CM

## 2017-01-12 NOTE — Progress Notes (Signed)
Location:  [facility] Room Number: 405P Place of Service:  SNF [phone])  Merrilee Seashore, MD  Patient Care Team: Margit Hanks, MD as PCP - General (Internal Medicine)  Extended Emergency Contact Information Primary Emergency Contact: Burkley,David & Neoma Laming [place] Home Phone: [phone] Mobile Phone: [phone] Relation: Son Secondary Emergency Contact: Richardean Chimera [place] Home Phone: [phone] Relation: None    Allergies: Ceftin [cefuroxime]  Chief Complaint  Patient presents with  . Medical Management of Chronic Issues    Routine Visit    HPI: Patient is 83 y.o. male who is being seen for routine issues of dementia, depression and HLD.  Past Medical History:  Diagnosis Date  . A-fib (HCC)   . Anorexia   . Aortic stenosis   . Arthritis   . Atrial fibrillation (HCC)   . Bacteremia due to Klebsiella pneumoniae 08/07/2015  . Candida UTI 10/01/2015  . Cerebral brain hemorrhage (HCC) 05/29/2015  . Chronic kidney disease   . Dementia    short term memory loss  . Encephalopathy acute   . Gangrene (HCC)    perineal  . High cholesterol   . History of TIAs   . Hyperlipidemia   . Hypertension   . Major depressive disorder   . Multiple falls   . Myasthenia gravis (HCC)   . Sepsis due to Klebsiella pneumoniae (HCC) 08/07/2015  . Urinary incontinence   . Vertigo   . Vitamin D deficiency     Past Surgical History:  Procedure Laterality Date  . BACK SURGERY  2006  . CERVICAL LAMINECTOMY  2002 ?  [name] CYSTOSCOPY N/A 06/26/2015   Procedure: CYSTOSCOPY;  Surgeon: Malen Gauze, MD;  Location: [facility];  Service: Urology;  Laterality: N/A;  . HERNIA REPAIR     1970s  . INSERTION OF SUPRAPUBIC CATHETER N/A 06/26/2015   Procedure: INSERTION OF SUPRAPUBIC CATHETER;  Surgeon: Malen Gauze, MD;  Location: [facility];  Service: Urology;  Laterality: N/A;    Allergies as of 01/12/2017      Reactions   Ceftin [cefuroxime] Diarrhea   Severe diarrhea      Medication List       Accurate as of 01/12/17 11:59 PM. Always use your most recent med list.          acetaminophen 500 MG tablet Commonly known as:  TYLENOL Take 500 mg by mouth every 6 (six) hours as needed for mild pain.   docusate sodium 100 MG capsule Commonly known as:  COLACE Take 100 mg by mouth 2 (two) times daily.   ELIQUIS 2.5 MG Tabs tablet Generic drug:  apixaban Take 2.5 mg by mouth 2 (two) times daily.   escitalopram 5 MG tablet Commonly known as:  LEXAPRO Take 5 mg by mouth daily.   metoprolol succinate 50 MG 24 hr tablet Commonly known as:  TOPROL-XL Take 50 mg by mouth every morning. Take with or immediately following a meal.   MYRBETRIQ 25 MG Tb24 tablet Generic drug:  mirabegron ER Take 25 mg by mouth daily.   NUTRITIONAL SUPPLEMENT PO Offer Magic Cup as bedtime snack related to weight loss   ENSURE Take 237 mLs by mouth 3 (three) times daily between meals.   Omeprazole 20 MG Tbec Take 1 tablet by mouth 30 minutes prior to breakfast daily   predniSONE 10 MG tablet Commonly known as:  DELTASONE Take 1 tablet (10 mg total) by mouth daily with breakfast.  pyridostigmine 60 MG tablet Commonly known as:  MESTINON Take 60 mg by mouth 3 (three) times daily.   senna 8.6 MG tablet Commonly known as:  SENOKOT Take 1 tablet by mouth 2 (two) times daily.   simvastatin 10 MG tablet Commonly known as:  ZOCOR Take 10 mg by mouth daily.   TAB-A-VITE Tabs Take 1 tablet by mouth daily.   Vitamin D3 2000 units Tabs Take 2,000 Units by mouth daily.       No orders of the defined types were placed in this encounter.   Immunization History  Administered Date(s) Administered  . Influenza,inj,Quad PF,36+ Mos 07/05/2015  . Influenza-Unspecified 07/17/2016  . PPD Test 11/09/2015  . Pneumococcal Polysaccharide-23 07/05/2015  . Tdap 03/31/2015    Social History  Substance Use Topics  .  Smoking status: Former Games developermoker  . Smokeless tobacco: Never Used  . Alcohol use No    Review of Systems  DATA OBTAINED: from patient GENERAL:  no fevers, fatigue, appetite changes SKIN: No itching, rash HEENT: No complaint RESPIRATORY: No cough, wheezing, SOB CARDIAC: No chest pain, palpitations, lower extremity edema  GI: No abdominal pain, No N/V/D or constipation, No heartburn or reflux  GU: No dysuria, frequency or urgency, or incontinence  MUSCULOSKELETAL: No unrelieved bone/joint pain NEUROLOGIC: No headache, dizziness  PSYCHIATRIC: No overt anxiety or sadness  Vitals:   01/12/17 1136  BP: 132/76  Pulse: 70  Resp: 18  Temp: 97.4 F (36.3 C)   Body mass index is 26.2 kg/m. Physical Exam  GENERAL APPEARANCE: Alert, conversant, No acute distress  SKIN: No diaphoresis rash HEENT: Unremarkable RESPIRATORY: Breathing is even, unlabored. Lung sounds are clear   CARDIOVASCULAR: Heart RRR no murmurs, rubs or gallops. No peripheral edema  GASTROINTESTINAL: Abdomen is soft, non-tender, not distended w/ normal bowel sounds.  GENITOURINARY: Bladder non tender, not distended  MUSCULOSKELETAL: No abnormal joints or musculature NEUROLOGIC: Cranial nerves 2-12 grossly intact. Moves all extremities PSYCHIATRIC: Mood and affect appropriate to situation with mild dementia, no behavioral issues  Patient Active Problem List   Diagnosis Date Noted  . Abnormality on screening test 12/26/2016  . Current use of long term anticoagulation - Eliquis for A fib 12/16/2016  . Depression 08/22/2016  . Vitamin D deficiency 07/11/2016  . Encounter for family conference with patient present 12/14/2015  . Frequent falls 11/05/2015  . FTT (failure to thrive) in adult 09/25/2015  . Neurogenic bladder 09/25/2015  . DNR (do not resuscitate) 08/06/2015  . Catheter-associated urinary tract infection (HCC) 08/03/2015  . Myasthenia gravis (HCC) 07/13/2015  . Dementia 07/04/2015  . Pressure ulcer  07/04/2015  . Dizziness and giddiness 05/29/2015  . Weakness 05/29/2015  . Proximal limb muscle weakness 05/29/2015  . Ataxia 05/29/2015  . CKD (chronic kidney disease), stage III 03/31/2015  . HTN (hypertension) 03/31/2015  . AF (atrial fibrillation) (HCC) 03/31/2015  . Dyslipidemia 03/31/2015    CMP     Component Value Date/Time   NA 144 08/25/2016   K 4.2 08/25/2016   CL 113 (H) 11/09/2015 0710   CO2 24 11/09/2015 0710   GLUCOSE 106 (H) 11/09/2015 0710   BUN 13 08/25/2016   CREATININE 0.9 08/25/2016   CREATININE 0.76 11/09/2015 0710   CALCIUM 8.4 (L) 11/09/2015 0710   PROT 5.1 (L) 11/06/2015 0330   PROT 7.7 05/29/2015 1319   ALBUMIN 2.5 (L) 11/06/2015 0330   ALBUMIN 4.0 05/29/2015 1319   AST 17 08/25/2016   ALT 22 08/25/2016   ALKPHOS 73 08/25/2016  BILITOT 0.8 11/06/2015 0330   BILITOT 1.2 05/29/2015 1319   GFRNONAA >60 11/09/2015 0710   GFRAA >60 11/09/2015 0710    Recent Labs  04/06/16 08/25/16  NA 144 144  K 4.4 4.2  BUN 16 13  CREATININE 1.0 0.9    Recent Labs  08/25/16  AST 17  ALT 22  ALKPHOS 73    Recent Labs  04/06/16 08/25/16  WBC 4.5 9.0  HGB 13.5 13.7  HCT 43 44  PLT 181 308    Recent Labs  02/19/16 08/25/16  CHOL 153 144  LDLCALC 65 64  TRIG 198* 156   No results found for: MICROALBUR Lab Results  Component Value Date   TSH 0.82 08/25/2016   Lab Results  Component Value Date   HGBA1C 6.4 08/25/2016   Lab Results  Component Value Date   CHOL 144 08/25/2016   HDL 48 08/25/2016   LDLCALC 64 08/25/2016   TRIG 156 08/25/2016   CHOLHDL 9.3 01/20/2015    Significant Diagnostic Results in last 30 days:  No results found.  Assessment and Plan  Dementia Chronic and mild; pt continues to do well and appears content  Depression Pt no longer on lexapro and mood appears good; will cont to monitor  Dyslipidemia HDL 48, LDL 65; controlled on zocor 10 mg daily ;plan to contcurrent meds     Parthiv Mucci D. Lyn Hollingshead, MD

## 2017-01-27 ENCOUNTER — Telehealth: Payer: Self-pay | Admitting: Internal Medicine

## 2017-01-27 NOTE — Telephone Encounter (Signed)
Spoke with Aram Beecham and they want to know if they can change the sig on the omeprazole to take it at 4:30pm instead of before breakfast because the family doesn't want him awaken that early which would be between 6:30-7:00AM.  Please advise and I will call Aram Beecham or Mary ([address] back.  Thank you.

## 2017-01-27 NOTE — Telephone Encounter (Signed)
That is fine 

## 2017-01-27 NOTE — Telephone Encounter (Signed)
I informed the nurse at [facility] that 4:30pm is okay per Dr Leone Payor to take his omeprazole.

## 2017-01-28 DIAGNOSIS — R338 Other retention of urine: Secondary | ICD-10-CM | POA: Diagnosis not present

## 2017-02-01 DIAGNOSIS — M6281 Muscle weakness (generalized): Secondary | ICD-10-CM | POA: Diagnosis not present

## 2017-02-01 DIAGNOSIS — G7000 Myasthenia gravis without (acute) exacerbation: Secondary | ICD-10-CM | POA: Diagnosis not present

## 2017-02-02 DIAGNOSIS — G7000 Myasthenia gravis without (acute) exacerbation: Secondary | ICD-10-CM | POA: Diagnosis not present

## 2017-02-02 DIAGNOSIS — M6281 Muscle weakness (generalized): Secondary | ICD-10-CM | POA: Diagnosis not present

## 2017-02-03 DIAGNOSIS — M6281 Muscle weakness (generalized): Secondary | ICD-10-CM | POA: Diagnosis not present

## 2017-02-03 DIAGNOSIS — G7000 Myasthenia gravis without (acute) exacerbation: Secondary | ICD-10-CM | POA: Diagnosis not present

## 2017-02-04 DIAGNOSIS — G7000 Myasthenia gravis without (acute) exacerbation: Secondary | ICD-10-CM | POA: Diagnosis not present

## 2017-02-04 DIAGNOSIS — M6281 Muscle weakness (generalized): Secondary | ICD-10-CM | POA: Diagnosis not present

## 2017-02-05 DIAGNOSIS — M6281 Muscle weakness (generalized): Secondary | ICD-10-CM | POA: Diagnosis not present

## 2017-02-05 DIAGNOSIS — G7000 Myasthenia gravis without (acute) exacerbation: Secondary | ICD-10-CM | POA: Diagnosis not present

## 2017-02-08 DIAGNOSIS — F329 Major depressive disorder, single episode, unspecified: Secondary | ICD-10-CM | POA: Diagnosis not present

## 2017-02-08 DIAGNOSIS — F419 Anxiety disorder, unspecified: Secondary | ICD-10-CM | POA: Diagnosis not present

## 2017-02-08 DIAGNOSIS — F0390 Unspecified dementia without behavioral disturbance: Secondary | ICD-10-CM | POA: Diagnosis not present

## 2017-02-08 DIAGNOSIS — M6281 Muscle weakness (generalized): Secondary | ICD-10-CM | POA: Diagnosis not present

## 2017-02-08 DIAGNOSIS — G7000 Myasthenia gravis without (acute) exacerbation: Secondary | ICD-10-CM | POA: Diagnosis not present

## 2017-02-09 DIAGNOSIS — M6281 Muscle weakness (generalized): Secondary | ICD-10-CM | POA: Diagnosis not present

## 2017-02-09 DIAGNOSIS — G7000 Myasthenia gravis without (acute) exacerbation: Secondary | ICD-10-CM | POA: Diagnosis not present

## 2017-02-10 DIAGNOSIS — G7000 Myasthenia gravis without (acute) exacerbation: Secondary | ICD-10-CM | POA: Diagnosis not present

## 2017-02-10 DIAGNOSIS — M6281 Muscle weakness (generalized): Secondary | ICD-10-CM | POA: Diagnosis not present

## 2017-02-12 ENCOUNTER — Non-Acute Institutional Stay (SKILLED_NURSING_FACILITY): Payer: Medicare Other | Admitting: Internal Medicine

## 2017-02-12 ENCOUNTER — Encounter: Payer: Self-pay | Admitting: Internal Medicine

## 2017-02-12 DIAGNOSIS — I10 Essential (primary) hypertension: Secondary | ICD-10-CM | POA: Diagnosis not present

## 2017-02-12 DIAGNOSIS — N183 Chronic kidney disease, stage 3 unspecified: Secondary | ICD-10-CM

## 2017-02-12 DIAGNOSIS — G7000 Myasthenia gravis without (acute) exacerbation: Secondary | ICD-10-CM | POA: Diagnosis not present

## 2017-02-12 NOTE — Progress Notes (Signed)
Location:  [facility] Room Number: 405P Place of Service:  SNF ([id number])  Margit Hanks, MD  Patient Care Team: Margit Hanks, MD as PCP - General (Internal Medicine)  Extended Emergency Contact Information Primary Emergency Contact: Guggenheim,David & Neoma Laming [place] Home Phone: [phone] Mobile Phone: [phone] Relation: Son Secondary Emergency Contact: Richardean Chimera [place] Home Phone: [phone] Relation: None    Allergies: Ceftin [cefuroxime]  Chief Complaint  Patient presents with  . Medical Management of Chronic Issues    Routine Visit    HPI: Patient is 83 y.o. male who is being seen for routine issues of HTN, myasthenia gravis and CKD3.  Past Medical History:  Diagnosis Date  . A-fib (HCC)   . Anorexia   . Aortic stenosis   . Arthritis   . Atrial fibrillation (HCC)   . Bacteremia due to Klebsiella pneumoniae 08/07/2015  . Candida UTI 10/01/2015  . Cerebral brain hemorrhage (HCC) 05/29/2015  . Chronic kidney disease   . Dementia    short term memory loss  . Encephalopathy acute   . Gangrene (HCC)    perineal  . High cholesterol   . History of TIAs   . Hyperlipidemia   . Hypertension   . Major depressive disorder   . Multiple falls   . Myasthenia gravis (HCC)   . Sepsis due to Klebsiella pneumoniae (HCC) 08/07/2015  . Urinary incontinence   . Vertigo   . Vitamin D deficiency     Past Surgical History:  Procedure Laterality Date  . BACK SURGERY  2006  . CERVICAL LAMINECTOMY  2002 ?  [name] CYSTOSCOPY N/A 06/26/2015   Procedure: CYSTOSCOPY;  Surgeon: Malen Gauze, MD;  Location: [facility];  Service: Urology;  Laterality: N/A;  . HERNIA REPAIR     1970s  . INSERTION OF SUPRAPUBIC CATHETER N/A 06/26/2015   Procedure: INSERTION OF SUPRAPUBIC CATHETER;  Surgeon: Malen Gauze, MD;  Location: [facility];  Service: Urology;  Laterality: N/A;    Allergies as of 02/12/2017    Reactions   Ceftin [cefuroxime] Diarrhea   Severe diarrhea      Medication List       Accurate as of 02/12/17 11:59 PM. Always use your most recent med list.          acetaminophen 500 MG tablet Commonly known as:  TYLENOL Take 500 mg by mouth every 6 (six) hours as needed for mild pain.   docusate sodium 100 MG capsule Commonly known as:  COLACE Take 100 mg by mouth 2 (two) times daily.   ELIQUIS 2.5 MG Tabs tablet Generic drug:  apixaban Take 2.5 mg by mouth 2 (two) times daily.   metoprolol succinate 50 MG 24 hr tablet Commonly known as:  TOPROL-XL Take 50 mg by mouth every morning. Take with or immediately following a meal.   MYRBETRIQ 25 MG Tb24 tablet Generic drug:  mirabegron ER Take 25 mg by mouth daily.   NUTRITIONAL SUPPLEMENT PO Offer Magic Cup as bedtime snack related to weight loss   ENSURE Take 237 mLs by mouth 3 (three) times daily between meals.   predniSONE 10 MG tablet Commonly known as:  DELTASONE Take 1 tablet (10 mg total) by mouth daily with breakfast.   pyridostigmine 60 MG tablet Commonly known as:  MESTINON Take 60 mg by mouth 3 (three) times daily.   senna 8.6 MG tablet Commonly known as:  SENOKOT Take 1 tablet  by mouth 2 (two) times daily.   simvastatin 10 MG tablet Commonly known as:  ZOCOR Take 10 mg by mouth daily.   TAB-A-VITE Tabs Take 1 tablet by mouth daily.   UNABLE TO FIND Med Name: Magic Cup  Offer as a bedtime snack related to weight loss   Vitamin D3 2000 units Tabs Take 2,000 Units by mouth daily.       No orders of the defined types were placed in this encounter.   Immunization History  Administered Date(s) Administered  . Influenza,inj,Quad PF,36+ Mos 07/05/2015  . Influenza-Unspecified 07/17/2016  . PPD Test 11/09/2015  . Pneumococcal Polysaccharide-23 07/05/2015  . Tdap 03/31/2015    Social History  Substance Use Topics  . Smoking status: Former Games developer  . Smokeless tobacco: Never Used  .  Alcohol use No    Review of Systems  DATA OBTAINED: from patient, nurse GENERAL:  no fevers, fatigue, appetite changes SKIN: No itching, rash HEENT: No complaint RESPIRATORY: No cough, wheezing, SOB CARDIAC: No chest pain, palpitations, lower extremity edema  GI: No abdominal pain, No N/V/D or constipation, No heartburn or reflux  GU: No dysuria, frequency or urgency, or incontinence  MUSCULOSKELETAL: No unrelieved bone/joint pain NEUROLOGIC: No headache, dizziness  PSYCHIATRIC: No overt anxiety or sadness  Vitals:   02/12/17 0802  BP: (!) 144/81  Pulse: 78  Resp: 18  Temp: 97.1 F (36.2 C)   Body mass index is 26.11 kg/m. Physical Exam  GENERAL APPEARANCE: Alert, conversant, No acute distress  SKIN: No diaphoresis rash HEENT: Unremarkable RESPIRATORY: Breathing is even, unlabored. Lung sounds are clear   CARDIOVASCULAR: Heart RRR no murmurs, rubs or gallops. No peripheral edema  GASTROINTESTINAL: Abdomen is soft, non-tender, not distended w/ normal bowel sounds.  GENITOURINARY: Bladder non tender, not distended  MUSCULOSKELETAL: No abnormal joints or musculature NEUROLOGIC: Cranial nerves 2-12 grossly intact. Moves all extremities PSYCHIATRIC: Mood and affect appropriate to situation with mild dementia, no behavioral issues  Patient Active Problem List   Diagnosis Date Noted  . Abnormality on screening test 12/26/2016  . Current use of long term anticoagulation - Eliquis for A fib 12/16/2016  . Depression 08/22/2016  . Vitamin D deficiency 07/11/2016  . Encounter for family conference with patient present 12/14/2015  . Frequent falls 11/05/2015  . FTT (failure to thrive) in adult 09/25/2015  . Neurogenic bladder 09/25/2015  . DNR (do not resuscitate) 08/06/2015  . Catheter-associated urinary tract infection (HCC) 08/03/2015  . Myasthenia gravis (HCC) 07/13/2015  . Dementia 07/04/2015  . Pressure ulcer 07/04/2015  . Dizziness and giddiness 05/29/2015  .  Weakness 05/29/2015  . Proximal limb muscle weakness 05/29/2015  . Ataxia 05/29/2015  . CKD (chronic kidney disease), stage III 03/31/2015  . HTN (hypertension) 03/31/2015  . AF (atrial fibrillation) (HCC) 03/31/2015  . Dyslipidemia 03/31/2015    CMP     Component Value Date/Time   NA 145 02/15/2017   K 4.3 02/15/2017   CL 113 (H) 11/09/2015 0710   CO2 24 11/09/2015 0710   GLUCOSE 106 (H) 11/09/2015 0710   BUN 16 02/15/2017   CREATININE 0.9 02/15/2017   CREATININE 0.76 11/09/2015 0710   CALCIUM 8.4 (L) 11/09/2015 0710   PROT 5.1 (L) 11/06/2015 0330   PROT 7.7 05/29/2015 1319   ALBUMIN 2.5 (L) 11/06/2015 0330   ALBUMIN 4.0 05/29/2015 1319   AST 23 02/15/2017   ALT 26 02/15/2017   ALKPHOS 76 02/15/2017   BILITOT 0.8 11/06/2015 0330   BILITOT 1.2 05/29/2015 1319  GFRNONAA >60 11/09/2015 0710   GFRAA >60 11/09/2015 0710    Recent Labs  04/06/16 08/25/16 02/15/17  NA 144 144 145  K 4.4 4.2 4.3  BUN CREATININE 1.0 0.9 0.9    Recent Labs  08/25/16 02/15/17  AST 17 23  ALT 22 26  ALKPHOS 73 76    Recent Labs  04/06/16 08/25/16 02/15/17  WBC 4.5 9.0 9.2  HGB 13.5 13.7 14.3  HCT 43 44 42  PLT 181 308 384    Recent Labs  08/25/16 02/15/17  CHOL 144 143  LDLCALC 64 77  TRIG 156 113   No results found for: MICROALBUR Lab Results  Component Value Date   TSH 0.86 02/15/2017   Lab Results  Component Value Date   HGBA1C 6.7 02/15/2017   Lab Results  Component Value Date   CHOL 143 02/15/2017   HDL 44 02/15/2017   LDLCALC 77 02/15/2017   TRIG 113 02/15/2017   CHOLHDL 9.3 01/20/2015    Significant Diagnostic Results in last 30 days:  No results found.  Assessment and Plan  HTN (hypertension) Controlled ;plan to  Cont  toprol XL 50 mg daily  Myasthenia gravis (HCC) Stable and chronic; plan to cont mestinon 30 mg TID  CKD (chronic kidney disease), stage III BUN/CR stable/ due for labs; plan to monitor at intervals    Thurston Hole D.  Lyn Hollingshead, MD

## 2017-02-13 ENCOUNTER — Encounter: Payer: Self-pay | Admitting: Internal Medicine

## 2017-02-13 NOTE — Assessment & Plan Note (Signed)
Chronic and mild; pt continues to do well and appears content

## 2017-02-13 NOTE — Assessment & Plan Note (Signed)
Pt no longer on lexapro and mood appears good; will cont to monitor

## 2017-02-13 NOTE — Assessment & Plan Note (Signed)
HDL 48, LDL 65; controlled on zocor 10 mg daily ;plan to contcurrent meds

## 2017-02-15 DIAGNOSIS — E039 Hypothyroidism, unspecified: Secondary | ICD-10-CM | POA: Diagnosis not present

## 2017-02-15 DIAGNOSIS — E559 Vitamin D deficiency, unspecified: Secondary | ICD-10-CM | POA: Diagnosis not present

## 2017-02-15 DIAGNOSIS — E119 Type 2 diabetes mellitus without complications: Secondary | ICD-10-CM | POA: Diagnosis not present

## 2017-02-15 DIAGNOSIS — E785 Hyperlipidemia, unspecified: Secondary | ICD-10-CM | POA: Diagnosis not present

## 2017-02-15 DIAGNOSIS — N183 Chronic kidney disease, stage 3 (moderate): Secondary | ICD-10-CM | POA: Diagnosis not present

## 2017-02-15 LAB — HEPATIC FUNCTION PANEL
ALK PHOS: 76 U/L (ref 25–125)
ALT: 26 U/L (ref 10–40)
AST: 23 U/L (ref 14–40)
Bilirubin, Total: 0.3 mg/dL

## 2017-02-15 LAB — BASIC METABOLIC PANEL
BUN: 16 mg/dL (ref 4–21)
Creatinine: 0.9 mg/dL (ref 0.6–1.3)
Glucose: 100 mg/dL
Potassium: 4.3 mmol/L (ref 3.4–5.3)
Sodium: 145 mmol/L (ref 137–147)

## 2017-02-15 LAB — TSH: TSH: 0.86 u[IU]/mL (ref 0.41–5.90)

## 2017-02-15 LAB — LIPID PANEL
CHOLESTEROL: 143 mg/dL (ref 0–200)
HDL: 44 mg/dL (ref 35–70)
LDL CALC: 77 mg/dL
TRIGLYCERIDES: 113 mg/dL (ref 40–160)

## 2017-02-15 LAB — CBC AND DIFFERENTIAL
HEMATOCRIT: 42 % (ref 41–53)
HEMOGLOBIN: 14.3 g/dL (ref 13.5–17.5)
PLATELETS: 384 10*3/uL (ref 150–399)
WBC: 9.2 10^3/mL

## 2017-02-15 LAB — VITAMIN D 25 HYDROXY (VIT D DEFICIENCY, FRACTURES): Vit D, 25-Hydroxy: 49.52

## 2017-02-15 LAB — HEMOGLOBIN A1C: Hemoglobin A1C: 6.7

## 2017-02-23 DIAGNOSIS — R338 Other retention of urine: Secondary | ICD-10-CM | POA: Diagnosis not present

## 2017-03-03 DIAGNOSIS — F0390 Unspecified dementia without behavioral disturbance: Secondary | ICD-10-CM | POA: Diagnosis not present

## 2017-03-03 DIAGNOSIS — F329 Major depressive disorder, single episode, unspecified: Secondary | ICD-10-CM | POA: Diagnosis not present

## 2017-03-03 DIAGNOSIS — F419 Anxiety disorder, unspecified: Secondary | ICD-10-CM | POA: Diagnosis not present

## 2017-03-14 ENCOUNTER — Encounter: Payer: Self-pay | Admitting: Internal Medicine

## 2017-03-14 NOTE — Assessment & Plan Note (Signed)
Stable and chronic; plan to cont mestinon 30 mg TID

## 2017-03-14 NOTE — Assessment & Plan Note (Signed)
BUN/CR stable/ due for labs; plan to monitor at intervals

## 2017-03-14 NOTE — Assessment & Plan Note (Signed)
Controlled ;plan to  Cont  toprol XL 50 mg daily

## 2017-03-17 ENCOUNTER — Non-Acute Institutional Stay (SKILLED_NURSING_FACILITY): Payer: Medicare Other | Admitting: Internal Medicine

## 2017-03-17 ENCOUNTER — Encounter: Payer: Self-pay | Admitting: Internal Medicine

## 2017-03-17 DIAGNOSIS — G301 Alzheimer's disease with late onset: Secondary | ICD-10-CM | POA: Diagnosis not present

## 2017-03-17 DIAGNOSIS — E785 Hyperlipidemia, unspecified: Secondary | ICD-10-CM | POA: Diagnosis not present

## 2017-03-17 DIAGNOSIS — F0280 Dementia in other diseases classified elsewhere without behavioral disturbance: Secondary | ICD-10-CM

## 2017-03-17 DIAGNOSIS — I4891 Unspecified atrial fibrillation: Secondary | ICD-10-CM

## 2017-03-17 NOTE — Progress Notes (Signed)
[facility] Room Number: 405P Place of Service:  SNF (31)  Margit HanksAlexander, Adream Parzych D, MD  Patient Care Team: Margit HanksAlexander, Quina Wilbourne D, MD as PCP - General (Internal Medicine) McKenzie, Mardene CelestePatrick L, MD as Consulting Physician (Urology)  Extended Emergency Contact Information Primary Emergency Contact: Hennes,David & Neoma LamingEllen  [place] Home Phone: [phone] Mobile Phone: [phone] Relation: Son Secondary Emergency Contact: Richardean ChimeraSmoak,Mark  [place] Home Phone: [phone] Relation: None    Allergies: Ceftin [cefuroxime]  Chief Complaint  Patient presents with  . Medical Management of Chronic Issues    Routine Visit    HPI: Patient is 83 y.o. male who Is being seen for routine issues of atrial fibrillation, dementia, and hyperlipidemia.  Past Medical History:  Diagnosis Date  . A-fib (HCC)   . Anorexia   . Aortic stenosis   . Arthritis   . Atrial fibrillation (HCC)   . Bacteremia due to Klebsiella pneumoniae 08/07/2015  . Candida UTI 10/01/2015  . Cerebral brain hemorrhage (HCC) 05/29/2015  . Chronic kidney disease   . Dementia    short term memory loss  . Encephalopathy acute   . Gangrene (HCC)    perineal  . High cholesterol   . History of TIAs   . Hyperlipidemia   . Hypertension   . Major depressive disorder   . Multiple falls   . Myasthenia gravis (HCC)   . Sepsis due to Klebsiella pneumoniae (HCC) 08/07/2015  . Urinary incontinence   . Vertigo   . Vitamin D deficiency     Past Surgical History:  Procedure Laterality Date  . BACK SURGERY  2006  . CERVICAL LAMINECTOMY  2002 ?  [name]. CYSTOSCOPY N/A 06/26/2015   Procedure: CYSTOSCOPY;  Surgeon: Malen GauzePatrick L McKenzie, MD;  Location: [facility];  Service: Urology;  Laterality: N/A;  . HERNIA REPAIR     1970s  . INSERTION OF SUPRAPUBIC CATHETER N/A 06/26/2015   Procedure: INSERTION OF SUPRAPUBIC CATHETER;  Surgeon: Malen GauzePatrick L McKenzie, MD;  Location: [facility];   Service: Urology;  Laterality: N/A;    Allergies as of 03/17/2017      Reactions   Ceftin [cefuroxime] Diarrhea   Severe diarrhea      Medication List       Accurate as of 03/17/17 11:59 PM. Always use your most recent med list.          acetaminophen 500 MG tablet Commonly known as:  TYLENOL Take 500 mg by mouth every 6 (six) hours as needed for mild pain.   docusate sodium 100 MG capsule Commonly known as:  COLACE Take 100 mg by mouth 2 (two) times daily.   ELIQUIS 2.5 MG Tabs tablet Generic drug:  apixaban Take 2.5 mg by mouth 2 (two) times daily.   metoprolol succinate 50 MG 24 hr tablet Commonly known as:  TOPROL-XL Take 50 mg by mouth every morning. Take with or immediately following a meal.   MYRBETRIQ 25 MG Tb24 tablet Generic drug:  mirabegron ER Take 25 mg by mouth daily.   NUTRITIONAL SUPPLEMENT PO Offer Magic Cup as bedtime snack related to weight loss   ENSURE Take 237 mLs by mouth 3 (three) times daily between meals.   predniSONE 10 MG tablet Commonly known as:  DELTASONE Take 1 tablet (10 mg total) by mouth daily with breakfast.   pyridostigmine 60 MG tablet Commonly known as:  MESTINON Take 60 mg by mouth 3 (three) times daily.   senna  8.6 MG tablet Commonly known as:  SENOKOT Take 1 tablet by mouth 2 (two) times daily.   simvastatin 10 MG tablet Commonly known as:  ZOCOR Take 10 mg by mouth daily.   TAB-A-VITE Tabs Take 1 tablet by mouth daily.   UNABLE TO FIND Med Name: Magic Cup  Offer as a bedtime snack related to weight loss   Vitamin D3 2000 units Tabs Take 2,000 Units by mouth daily.       No orders of the defined types were placed in this encounter.   Immunization History  Administered Date(s) Administered  . Influenza,inj,Quad PF,36+ Mos 07/05/2015  . Influenza-Unspecified 07/17/2016  . PPD Test 11/09/2015  . Pneumococcal Polysaccharide-23 07/05/2015  . Tdap 03/31/2015    Social History  Substance Use Topics    . Smoking status: Former Games developer  . Smokeless tobacco: Never Used  . Alcohol use No    Review of Systems  DATA OBTAINED: from patient, nurse GENERAL:  no fevers, fatigue, appetite changes SKIN: No itching, rash HEENT: No complaint RESPIRATORY: No cough, wheezing, SOB CARDIAC: No chest pain, palpitations, lower extremity edema  GI: No abdominal pain, No N/V/D or constipation, No heartburn or reflux  GU: No dysuria, frequency or urgency, or incontinence  MUSCULOSKELETAL: No unrelieved bone/joint pain NEUROLOGIC: No headache, dizziness  PSYCHIATRIC: No overt anxiety or sadness  Vitals:   03/17/17 1100  BP: 114/76  Pulse: 75  Resp: 18  Temp: 97.6 F (36.4 C)   Body mass index is 25.66 kg/m. Physical Exam  GENERAL APPEARANCE: Alert, conversant, No acute distress  SKIN: No diaphoresis rash HEENT: Unremarkable RESPIRATORY: Breathing is even, unlabored. Lung sounds are clear   CARDIOVASCULAR: Heart RRR no murmurs, rubs or gallops. No peripheral edema  GASTROINTESTINAL: Abdomen is soft, non-tender, not distended w/ normal bowel sounds.  GENITOURINARY: Bladder non tender, not distended  MUSCULOSKELETAL: No abnormal joints or musculature NEUROLOGIC: Cranial nerves 2-12 grossly intact. Moves all extremities PSYCHIATRIC: Mood and affect appropriate to situationWith mild dementia, no behavioral issues  Patient Active Problem List   Diagnosis Date Noted  . Hyperlipidemia 05/01/2017  . Abnormality on screening test 12/26/2016  . Current use of long term anticoagulation - Eliquis for A fib 12/16/2016  . Depression 08/22/2016  . Vitamin D deficiency 07/11/2016  . Encounter for family conference with patient present 12/14/2015  . Frequent falls 11/05/2015  . FTT (failure to thrive) in adult 09/25/2015  . Neurogenic bladder 09/25/2015  . DNR (do not resuscitate) 08/06/2015  . Catheter-associated urinary tract infection (HCC) 08/03/2015  . Myasthenia gravis (HCC) 07/13/2015  .  Dementia 07/04/2015  . Pressure ulcer 07/04/2015  . Dizziness and giddiness 05/29/2015  . Weakness 05/29/2015  . Proximal limb muscle weakness 05/29/2015  . Ataxia 05/29/2015  . CKD (chronic kidney disease), stage III 03/31/2015  . HTN (hypertension) 03/31/2015  . AF (atrial fibrillation) (HCC) 03/31/2015  . Dyslipidemia 03/31/2015    CMP     Component Value Date/Time   NA 145 02/15/2017   K 4.3 02/15/2017   CL 113 (H) 11/09/2015 0710   CO2 24 11/09/2015 0710   GLUCOSE 106 (H) 11/09/2015 0710   BUN 16 02/15/2017   CREATININE 0.9 02/15/2017   CREATININE 0.76 11/09/2015 0710   CALCIUM 8.4 (L) 11/09/2015 0710   PROT 5.1 (L) 11/06/2015 0330   PROT 7.7 05/29/2015 1319   ALBUMIN 2.5 (L) 11/06/2015 0330   ALBUMIN 4.0 05/29/2015 1319   AST 23 02/15/2017   ALT 26 02/15/2017   ALKPHOS  76 02/15/2017   BILITOT 0.8 11/06/2015 0330   BILITOT 1.2 05/29/2015 1319   GFRNONAA >60 11/09/2015 0710   GFRAA >60 11/09/2015 0710    Recent Labs  08/25/16 02/15/17  NA 144 145  K 4.2 4.3  BUN 13 16  CREATININE 0.9 0.9    Recent Labs  08/25/16 02/15/17  AST 17 23  ALT 22 26  ALKPHOS 73 76    Recent Labs  08/25/16 02/15/17  WBC 9.0 9.2  HGB 13.7 14.3  HCT 44 42  PLT 308 384    Recent Labs  08/25/16 02/15/17  CHOL 144 143  LDLCALC 64 77  TRIG 156 113   No results found for: MICROALBUR Lab Results  Component Value Date   TSH 0.86 02/15/2017   Lab Results  Component Value Date   HGBA1C 6.7 02/15/2017   Lab Results  Component Value Date   CHOL 143 02/15/2017   HDL 44 02/15/2017   LDLCALC 77 02/15/2017   TRIG 113 02/15/2017   CHOLHDL 9.3 01/20/2015    Significant Diagnostic Results in last 30 days:  No results found.  Assessment and Plan  AF (atrial fibrillation) (HCC) Chronic and stable; continue metoprolol XL 50 mg by mouth daily and Eliquis 2.5 mg twice a day  Dementia Chronic, stable, and mild; will continue supportive care  Hyperlipidemia Most recent  LDL is 77, HDL 44-very good control; continue Zocor 10 mg by mouth daily    Axton Cihlar D. Lyn Hollingshead,  MD

## 2017-03-30 DIAGNOSIS — R338 Other retention of urine: Secondary | ICD-10-CM | POA: Diagnosis not present

## 2017-04-05 DIAGNOSIS — F0390 Unspecified dementia without behavioral disturbance: Secondary | ICD-10-CM | POA: Diagnosis not present

## 2017-04-05 DIAGNOSIS — F419 Anxiety disorder, unspecified: Secondary | ICD-10-CM | POA: Diagnosis not present

## 2017-04-05 DIAGNOSIS — F331 Major depressive disorder, recurrent, moderate: Secondary | ICD-10-CM | POA: Diagnosis not present

## 2017-04-16 ENCOUNTER — Non-Acute Institutional Stay (SKILLED_NURSING_FACILITY): Payer: Medicare Other | Admitting: Internal Medicine

## 2017-04-16 ENCOUNTER — Encounter: Payer: Self-pay | Admitting: Internal Medicine

## 2017-04-16 DIAGNOSIS — E559 Vitamin D deficiency, unspecified: Secondary | ICD-10-CM

## 2017-04-16 DIAGNOSIS — N319 Neuromuscular dysfunction of bladder, unspecified: Secondary | ICD-10-CM

## 2017-04-16 DIAGNOSIS — E785 Hyperlipidemia, unspecified: Secondary | ICD-10-CM

## 2017-04-16 NOTE — Progress Notes (Signed)
Location[facility] Room Number: [phone] Place of Service:  SNF [phone])  Margit Hanks, MD  Patient Care Team: Margit Hanks, MD as PCP - General (Internal Medicine) McKenzie, Mardene Celeste, MD as Consulting Physician (Urology)  Extended Emergency Contact Information Primary Emergency Contact: Dennie,David & Neoma Laming [place] Home Phone: [phone] Mobile Phone: [phone] Relation: Son Secondary Emergency Contact: Richardean Chimera [place] Home Phone: [phone] Relation: None    Allergies: Ceftin [cefuroxime]  Chief Complaint  Patient presents with  . Medical Management of Chronic Issues    routine visit    HPI: Patient is 83 y.o. male who Is being seen for routine issues of vitamin D deficiency neurogenic bladder and hyperlipidemia.  Past Medical History:  Diagnosis Date  . A-fib (HCC)   . Anorexia   . Aortic stenosis   . Arthritis   . Atrial fibrillation (HCC)   . Bacteremia due to Klebsiella pneumoniae 08/07/2015  . Candida UTI 10/01/2015  . Cerebral brain hemorrhage (HCC) 05/29/2015  . Chronic kidney disease   . Dementia    short term memory loss  . Encephalopathy acute   . Gangrene (HCC)    perineal  . High cholesterol   . History of TIAs   . Hyperlipidemia   . Hypertension   . Major depressive disorder   . Multiple falls   . Myasthenia gravis (HCC)   . Sepsis due to Klebsiella pneumoniae (HCC) 08/07/2015  . Urinary incontinence   . Vertigo   . Vitamin D deficiency     Past Surgical History:  Procedure Laterality Date  . BACK SURGERY  2006  . CERVICAL LAMINECTOMY  2002 ?  [name] CYSTOSCOPY N/A 06/26/2015   Procedure: CYSTOSCOPY;  Surgeon: Malen Gauze, MD;  Location: [facility];  Service: Urology;  Laterality: N/A;  . HERNIA REPAIR     1970s  . INSERTION OF SUPRAPUBIC CATHETER N/A 06/26/2015   Procedure: INSERTION OF SUPRAPUBIC CATHETER;  Surgeon: Malen Gauze, MD;  Location: [facility];  Service: Urology;  Laterality: N/A;    Allergies as of 04/16/2017      Reactions   Ceftin [cefuroxime] Diarrhea   Severe diarrhea      Medication List       Accurate as of 04/16/17 11:59 PM. Always use your most recent med list.          acetaminophen 500 MG tablet Commonly known as:  TYLENOL Take 500 mg by mouth every 6 (six) hours as needed for mild pain.   docusate sodium 100 MG capsule Commonly known as:  COLACE Take 100 mg by mouth 2 (two) times daily.   ELIQUIS 2.5 MG Tabs tablet Generic drug:  apixaban Take 2.5 mg by mouth 2 (two) times daily.   metoprolol succinate 50 MG 24 hr tablet Commonly known as:  TOPROL-XL Take 50 mg by mouth every morning. Take with or immediately following a meal.   MYRBETRIQ 25 MG Tb24 tablet Generic drug:  mirabegron ER Take 25 mg by mouth daily.   NUTRITIONAL SUPPLEMENT PO Offer Magic Cup as bedtime snack related to weight loss   ENSURE Take 237 mLs by mouth 3 (three) times daily between meals.   omeprazole 20 MG capsule Commonly known as:  PRILOSEC Take 20 mg by mouth daily.   predniSONE 10 MG tablet Commonly known as:  DELTASONE Take 1 tablet (10 mg total) by mouth daily with breakfast.   pyridostigmine 60  MG tablet Commonly known as:  MESTINON Take 60 mg by mouth 3 (three) times daily.   senna 8.6 MG tablet Commonly known as:  SENOKOT Take 1 tablet by mouth 2 (two) times daily.   simvastatin 10 MG tablet Commonly known as:  ZOCOR Take 10 mg by mouth daily.   TAB-A-VITE Tabs Take 1 tablet by mouth daily.   Vitamin D3 2000 units Tabs Take 2,000 Units by mouth daily.       No orders of the defined types were placed in this encounter.   Immunization History  Administered Date(s) Administered  . Influenza,inj,Quad PF,36+ Mos 07/05/2015  . Influenza-Unspecified 07/17/2016  . PPD Test 11/09/2015  . Pneumococcal Polysaccharide-23 07/05/2015  . Tdap 03/31/2015    Social History  Substance Use  Topics  . Smoking status: Former Smoker    Types: Cigars  . Smokeless tobacco: Never Used  . Alcohol use No    Review of Systems  DATA OBTAINED: from patient GENERAL:  no fevers, fatigue, appetite changes SKIN: No itching, rash HEENT: No complaint RESPIRATORY: No cough, wheezing, SOB CARDIAC: No chest pain, palpitations, lower extremity edema  GI: No abdominal pain, No N/V/D or constipation, No heartburn or reflux  GU: No dysuria, frequency or urgency, or incontinence  MUSCULOSKELETAL: No unrelieved bone/joint pain NEUROLOGIC: No headache, dizziness  PSYCHIATRIC: No overt anxiety or sadness  Vitals:   04/16/17 1604  BP: 114/76  Pulse: 62  Resp: 20  Temp: 97.6 F (36.4 C)   Body mass index is 25.74 kg/m. Physical Exam  GENERAL APPEARANCE: Alert, conversant, No acute distress  SKIN: No diaphoresis rash HEENT: Unremarkable RESPIRATORY: Breathing is even, unlabored. Lung sounds are clear   CARDIOVASCULAR: Heart RRR no murmurs, rubs or gallops. No peripheral edema  GASTROINTESTINAL: Abdomen is soft, non-tender, not distended w/ normal bowel sounds.  GENITOURINARY: Bladder non tender, not distended  MUSCULOSKELETAL: No abnormal joints or musculature NEUROLOGIC: Cranial nerves 2-12 grossly intact. Moves all extremities PSYCHIATRIC: Mood and affect appropriateWith mild dementia, no behavioral issues  Patient Active Problem List   Diagnosis Date Noted  . Hyperlipidemia 05/01/2017  . Abnormality on screening test 12/26/2016  . Current use of long term anticoagulation - Eliquis for A fib 12/16/2016  . Depression 08/22/2016  . Vitamin D deficiency 07/11/2016  . Encounter for family conference with patient present 12/14/2015  . Frequent falls 11/05/2015  . FTT (failure to thrive) in adult 09/25/2015  . Neurogenic bladder 09/25/2015  . DNR (do not resuscitate) 08/06/2015  . Catheter-associated urinary tract infection (HCC) 08/03/2015  . Myasthenia gravis (HCC) 07/13/2015    . Dementia 07/04/2015  . Pressure ulcer 07/04/2015  . Dizziness and giddiness 05/29/2015  . Weakness 05/29/2015  . Proximal limb muscle weakness 05/29/2015  . Ataxia 05/29/2015  . CKD (chronic kidney disease), stage III 03/31/2015  . HTN (hypertension) 03/31/2015  . AF (atrial fibrillation) (HCC) 03/31/2015  . Dyslipidemia 03/31/2015    CMP     Component Value Date/Time   NA 145 02/15/2017   K 4.3 02/15/2017   CL 113 (H) 11/09/2015 0710   CO2 24 11/09/2015 0710   GLUCOSE 106 (H) 11/09/2015 0710   BUN 16 02/15/2017   CREATININE 0.9 02/15/2017   CREATININE 0.76 11/09/2015 0710   CALCIUM 8.4 (L) 11/09/2015 0710   PROT 5.1 (L) 11/06/2015 0330   PROT 7.7 05/29/2015 1319   ALBUMIN 2.5 (L) 11/06/2015 0330   ALBUMIN 4.0 05/29/2015 1319   AST 23 02/15/2017   ALT 26 02/15/2017  ALKPHOS 76 02/15/2017   BILITOT 0.8 11/06/2015 0330   BILITOT 1.2 05/29/2015 1319   GFRNONAA >60 11/09/2015 0710   GFRAA >60 11/09/2015 0710    Recent Labs  08/25/16 02/15/17  NA 144 145  K 4.2 4.3  BUN 13 16  CREATININE 0.9 0.9    Recent Labs  08/25/16 02/15/17  AST 17 23  ALT 22 26  ALKPHOS 73 76    Recent Labs  08/25/16 02/15/17  WBC 9.0 9.2  HGB 13.7 14.3  HCT 44 42  PLT 308 384    Recent Labs  08/25/16 02/15/17  CHOL 144 143  LDLCALC 64 77  TRIG 156 113   No results found for: MICROALBUR Lab Results  Component Value Date   TSH 0.86 02/15/2017   Lab Results  Component Value Date   HGBA1C 6.7 02/15/2017   Lab Results  Component Value Date   CHOL 143 02/15/2017   HDL 44 02/15/2017   LDLCALC 77 02/15/2017   TRIG 113 02/15/2017   CHOLHDL 9.3 01/20/2015    Significant Diagnostic Results in last 30 days:  No results found.  Assessment and Plan  Vitamin D deficiency Vitamin D level was 49 in April; plan to decrease replacement to 1000 units daily  Neurogenic bladder No reported recent infections; patient with suprapubic catheter; plan to continue Myrbetriq  24-hour tablet 25 mg by mouth daily  Dyslipidemia Results recent LDL 77, HDL 44; good control; plan to continue Zocor 10 mg by mouth daily    Skai Lickteig D. Lyn HollingsheadAlexander, MD

## 2017-04-27 DIAGNOSIS — R339 Retention of urine, unspecified: Secondary | ICD-10-CM | POA: Diagnosis not present

## 2017-05-01 ENCOUNTER — Encounter: Payer: Self-pay | Admitting: Internal Medicine

## 2017-05-01 DIAGNOSIS — R293 Abnormal posture: Secondary | ICD-10-CM | POA: Diagnosis not present

## 2017-05-01 DIAGNOSIS — R2681 Unsteadiness on feet: Secondary | ICD-10-CM | POA: Diagnosis not present

## 2017-05-01 DIAGNOSIS — G7000 Myasthenia gravis without (acute) exacerbation: Secondary | ICD-10-CM | POA: Diagnosis not present

## 2017-05-01 DIAGNOSIS — E785 Hyperlipidemia, unspecified: Secondary | ICD-10-CM | POA: Insufficient documentation

## 2017-05-01 DIAGNOSIS — M6281 Muscle weakness (generalized): Secondary | ICD-10-CM | POA: Diagnosis not present

## 2017-05-01 NOTE — Assessment & Plan Note (Signed)
Most recent LDL is 77, HDL 44-very good control; continue Zocor 10 mg by mouth daily

## 2017-05-01 NOTE — Assessment & Plan Note (Signed)
Chronic, stable, and mild; will continue supportive care

## 2017-05-01 NOTE — Assessment & Plan Note (Signed)
Chronic and stable; continue metoprolol XL 50 mg by mouth daily and Eliquis 2.5 mg twice a day

## 2017-05-03 DIAGNOSIS — G7000 Myasthenia gravis without (acute) exacerbation: Secondary | ICD-10-CM | POA: Diagnosis not present

## 2017-05-03 DIAGNOSIS — R293 Abnormal posture: Secondary | ICD-10-CM | POA: Diagnosis not present

## 2017-05-03 DIAGNOSIS — R2681 Unsteadiness on feet: Secondary | ICD-10-CM | POA: Diagnosis not present

## 2017-05-03 DIAGNOSIS — M6281 Muscle weakness (generalized): Secondary | ICD-10-CM | POA: Diagnosis not present

## 2017-05-04 DIAGNOSIS — B351 Tinea unguium: Secondary | ICD-10-CM | POA: Diagnosis not present

## 2017-05-04 DIAGNOSIS — R2681 Unsteadiness on feet: Secondary | ICD-10-CM | POA: Diagnosis not present

## 2017-05-04 DIAGNOSIS — R293 Abnormal posture: Secondary | ICD-10-CM | POA: Diagnosis not present

## 2017-05-04 DIAGNOSIS — M6281 Muscle weakness (generalized): Secondary | ICD-10-CM | POA: Diagnosis not present

## 2017-05-04 DIAGNOSIS — I739 Peripheral vascular disease, unspecified: Secondary | ICD-10-CM | POA: Diagnosis not present

## 2017-05-04 DIAGNOSIS — R600 Localized edema: Secondary | ICD-10-CM | POA: Diagnosis not present

## 2017-05-04 DIAGNOSIS — G7000 Myasthenia gravis without (acute) exacerbation: Secondary | ICD-10-CM | POA: Diagnosis not present

## 2017-05-05 ENCOUNTER — Non-Acute Institutional Stay (SKILLED_NURSING_FACILITY): Payer: Medicare Other

## 2017-05-05 DIAGNOSIS — R2681 Unsteadiness on feet: Secondary | ICD-10-CM | POA: Diagnosis not present

## 2017-05-05 DIAGNOSIS — Z0000 Encounter for general adult medical examination without abnormal findings: Secondary | ICD-10-CM | POA: Diagnosis not present

## 2017-05-05 DIAGNOSIS — G7000 Myasthenia gravis without (acute) exacerbation: Secondary | ICD-10-CM | POA: Diagnosis not present

## 2017-05-05 DIAGNOSIS — M6281 Muscle weakness (generalized): Secondary | ICD-10-CM | POA: Diagnosis not present

## 2017-05-05 DIAGNOSIS — R293 Abnormal posture: Secondary | ICD-10-CM | POA: Diagnosis not present

## 2017-05-05 NOTE — Patient Instructions (Signed)
Seth [name] , Thank you for taking time to come for your Medicare Wellness Visit. I appreciate your ongoing commitment to your health goals. Please review the following plan we discussed and let me know if I can assist you in the future.   Screening recommendations/referrals: Colonoscopy up to date, pt over age 81 Recommended yearly ophthalmology/optometry visit for glaucoma screening and checkup Recommended yearly dental visit for hygiene and checkup  Vaccinations: Influenza vaccine due 2018 fall season Pneumococcal vaccine up to date Tdap vaccine up to date. Due 03/30/25 Shingles vaccine not in records  Advanced directives: DNR in chart, rest of advanced directives needed for chart  Conditions/risks identified: None  Next appointment: Dr. Lyn HollingsheadAlexander makes rounds  Preventive Care 65 Years and Older, Male Preventive care refers to lifestyle choices and visits with your health care provider that can promote health and wellness. What does preventive care include?  A yearly physical exam. This is also called an annual well check.  Dental exams once or twice a year.  Routine eye exams. Ask your health care provider how often you should have your eyes checked.  Personal lifestyle choices, including:  Daily care of your teeth and gums.  Regular physical activity.  Eating a healthy diet.  Avoiding tobacco and drug use.  Limiting alcohol use.  Practicing safe sex.  Taking low doses of aspirin every day.  Taking vitamin and mineral supplements as recommended by your health care provider. What happens during an annual well check? The services and screenings done by your health care provider during your annual well check will depend on your age, overall health, lifestyle risk factors, and family history of disease. Counseling  Your health care provider may ask you questions about your:  Alcohol use.  Tobacco use.  Drug use.  Emotional well-being.  Home and relationship  well-being.  Sexual activity.  Eating habits.  History of falls.  Memory and ability to understand (cognition).  Work and work Astronomerenvironment. Screening  You may have the following tests or measurements:  Height, weight, and BMI.  Blood pressure.  Lipid and cholesterol levels. These may be checked every 5 years, or more frequently if you are over 334 years old.  Skin check.  Lung cancer screening. You may have this screening every year starting at age 81 if you have a 30-pack-year history of smoking and currently smoke or have quit within the past 15 years.  Fecal occult blood test (FOBT) of the stool. You may have this test every year starting at age 81.  Flexible sigmoidoscopy or colonoscopy. You may have a sigmoidoscopy every 5 years or a colonoscopy every 10 years starting at age 81.  Prostate cancer screening. Recommendations will vary depending on your family history and other risks.  Hepatitis C blood test.  Hepatitis B blood test.  Sexually transmitted disease (STD) testing.  Diabetes screening. This is done by checking your blood sugar (glucose) after you have not eaten for a while (fasting). You may have this done every 1-3 years.  Abdominal aortic aneurysm (AAA) screening. You may need this if you are a current or former smoker.  Osteoporosis. You may be screened starting at age 81 if you are at high risk. Talk with your health care provider about your test results, treatment options, and if necessary, the need for more tests. Vaccines  Your health care provider may recommend certain vaccines, such as:  Influenza vaccine. This is recommended every year.  Tetanus, diphtheria, and acellular pertussis (Tdap, Td) vaccine.  You may need a Td booster every 10 years.  Zoster vaccine. You may need this after age 85.  Pneumococcal 13-valent conjugate (PCV13) vaccine. One dose is recommended after age 75.  Pneumococcal polysaccharide (PPSV23) vaccine. One dose is  recommended after age 44. Talk to your health care provider about which screenings and vaccines you need and how often you need them. This information is not intended to replace advice given to you by your health care provider. Make sure you discuss any questions you have with your health care provider. Document Released: 11/01/2015 Document Revised: 06/24/2016 Document Reviewed: 08/06/2015 Elsevier Interactive Patient Education  2017 [place] Prevention in the Home Falls can cause injuries. They can happen to people of all ages. There are many things you can do to make your home safe and to help prevent falls. What can I do on the outside of my home?  Regularly fix the edges of walkways and driveways and fix any cracks.  Remove anything that might make you trip as you walk through a door, such as a raised step or threshold.  Trim any bushes or trees on the path to your home.  Use bright outdoor lighting.  Clear any walking paths of anything that might make someone trip, such as rocks or tools.  Regularly check to see if handrails are loose or broken. Make sure that both sides of any steps have handrails.  Any raised decks and porches should have guardrails on the edges.  Have any leaves, snow, or ice cleared regularly.  Use sand or salt on walking paths during winter.  Clean up any spills in your garage right away. This includes oil or grease spills. What can I do in the bathroom?  Use night lights.  Install grab bars by the toilet and in the tub and shower. Do not use towel bars as grab bars.  Use non-skid mats or decals in the tub or shower.  If you need to sit down in the shower, use a plastic, non-slip stool.  Keep the floor dry. Clean up any water that spills on the floor as soon as it happens.  Remove soap buildup in the tub or shower regularly.  Attach bath mats securely with double-sided non-slip rug tape.  Do not have throw rugs and other things on  the floor that can make you trip. What can I do in the bedroom?  Use night lights.  Make sure that you have a light by your bed that is easy to reach.  Do not use any sheets or blankets that are too big for your bed. They should not hang down onto the floor.  Have a firm chair that has side arms. You can use this for support while you get dressed.  Do not have throw rugs and other things on the floor that can make you trip. What can I do in the kitchen?  Clean up any spills right away.  Avoid walking on wet floors.  Keep items that you use a lot in easy-to-reach places.  If you need to reach something above you, use a strong step stool that has a grab bar.  Keep electrical cords out of the way.  Do not use floor polish or wax that makes floors slippery. If you must use wax, use non-skid floor wax.  Do not have throw rugs and other things on the floor that can make you trip. What can I do with my stairs?  Do not leave any  items on the stairs.  Make sure that there are handrails on both sides of the stairs and use them. Fix handrails that are broken or loose. Make sure that handrails are as long as the stairways.  Check any carpeting to make sure that it is firmly attached to the stairs. Fix any carpet that is loose or worn.  Avoid having throw rugs at the top or bottom of the stairs. If you do have throw rugs, attach them to the floor with carpet tape.  Make sure that you have a light switch at the top of the stairs and the bottom of the stairs. If you do not have them, ask someone to add them for you. What else can I do to help prevent falls?  Wear shoes that:  Do not have high heels.  Have rubber bottoms.  Are comfortable and fit you well.  Are closed at the toe. Do not wear sandals.  If you use a stepladder:  Make sure that it is fully opened. Do not climb a closed stepladder.  Make sure that both sides of the stepladder are locked into place.  Ask someone to  hold it for you, if possible.  Clearly mark and make sure that you can see:  Any grab bars or handrails.  First and last steps.  Where the edge of each step is.  Use tools that help you move around (mobility aids) if they are needed. These include:  Canes.  Walkers.  Scooters.  Crutches.  Turn on the lights when you go into a dark area. Replace any light bulbs as soon as they burn out.  Set up your furniture so you have a clear path. Avoid moving your furniture around.  If any of your floors are uneven, fix them.  If there are any pets around you, be aware of where they are.  Review your medicines with your doctor. Some medicines can make you feel dizzy. This can increase your chance of falling. Ask your doctor what other things that you can do to help prevent falls. This information is not intended to replace advice given to you by your health care provider. Make sure you discuss any questions you have with your health care provider. Document Released: 08/01/2009 Document Revised: 03/12/2016 Document Reviewed: 11/09/2014 Elsevier Interactive Patient Education  2017 [employer].

## 2017-05-05 NOTE — Progress Notes (Signed)
Subjective:   Seth [name] is a 83 y.o. male who presents for Medicare Annual/Subsequent preventive examination at [facility]   Last AWV- 12/04/08    Objective:    Vitals: BP 130/82 (BP Location: Right Arm, Patient Position: Sitting)   Pulse 74   Temp (!) 97.4 F (36.3 C) (Oral)   Ht 5\' 10"  (1.778 m)   Wt 179 lb (81.2 kg)   SpO2 94%   BMI 25.68 kg/m   Body mass index is 25.68 kg/m.  Tobacco History  Smoking Status  . Former Smoker  . Types: Cigars  Smokeless Tobacco  . Never Used     Counseling given: Not Answered   Past Medical History:  Diagnosis Date  . A-fib (HCC)   . Anorexia   . Aortic stenosis   . Arthritis   . Atrial fibrillation (HCC)   . Bacteremia due to Klebsiella pneumoniae 08/07/2015  . Candida UTI 10/01/2015  . Cerebral brain hemorrhage (HCC) 05/29/2015  . Chronic kidney disease   . Dementia    short term memory loss  . Encephalopathy acute   . Gangrene (HCC)    perineal  . High cholesterol   . History of TIAs   . Hyperlipidemia   . Hypertension   . Major depressive disorder   . Multiple falls   . Myasthenia gravis (HCC)   . Sepsis due to Klebsiella pneumoniae (HCC) 08/07/2015  . Urinary incontinence   . Vertigo   . Vitamin D deficiency    Past Surgical History:  Procedure Laterality Date  . BACK SURGERY  2006  . CERVICAL LAMINECTOMY  2002 ?  [name] CYSTOSCOPY N/A 06/26/2015   Procedure: CYSTOSCOPY;  Surgeon: Malen Gauze, MD;  Location: [facility];  Service: Urology;  Laterality: N/A;  . HERNIA REPAIR     1970s  . INSERTION OF SUPRAPUBIC CATHETER N/A 06/26/2015   Procedure: INSERTION OF SUPRAPUBIC CATHETER;  Surgeon: Malen Gauze, MD;  Location: [facility];  Service: Urology;  Laterality: N/A;   Family History  Problem Relation Age of Onset  . Stroke Mother   . Stroke Father   . Prostate cancer Brother   . Hypertension Daughter   . Colon cancer Neg Hx   . Stomach cancer Neg Hx   . Esophageal cancer Neg Hx   .  Rectal cancer Neg Hx   . Liver cancer Neg Hx    History  Sexual Activity  . Sexual activity: No    Outpatient Encounter Prescriptions as of 05/05/2017  Medication Sig  . acetaminophen (TYLENOL) 500 MG tablet Take 500 mg by mouth every 6 (six) hours as needed for mild pain.   [name] apixaban (ELIQUIS) 2.5 MG TABS tablet Take 2.5 mg by mouth 2 (two) times daily.  . Cholecalciferol (VITAMIN D3) 2000 UNITS TABS Take 2,000 Units by mouth daily.   [name] docusate sodium (COLACE) 100 MG capsule Take 100 mg by mouth 2 (two) times daily.   [name] ENSURE (ENSURE) Take 237 mLs by mouth 3 (three) times daily between meals.  . metoprolol succinate (TOPROL-XL) 50 MG 24 hr tablet Take 50 mg by mouth every morning. Take with or immediately following a meal.  . Multiple Vitamin (TAB-A-VITE) TABS Take 1 tablet by mouth daily.  [name] MYRBETRIQ 25 MG TB24 tablet Take 25 mg by mouth daily.  . Nutritional Supplements (NUTRITIONAL SUPPLEMENT PO) Offer Magic Cup as bedtime snack related to weight loss  . omeprazole (PRILOSEC) 20 MG capsule Take 20  mg by mouth daily.  . predniSONE (DELTASONE) 10 MG tablet Take 1 tablet (10 mg total) by mouth daily with breakfast.  . pyridostigmine (MESTINON) 60 MG tablet Take 60 mg by mouth 3 (three) times daily.  [name] senna (SENOKOT) 8.6 MG tablet Take 1 tablet by mouth 2 (two) times daily.   . simvastatin (ZOCOR) 10 MG tablet Take 10 mg by mouth daily.    No facility-administered encounter medications on file as of 05/05/2017.     Activities of Daily Living In your present state of health, do you have any difficulty performing the following activities: 05/05/2017  Hearing? Y  Vision? Y  Difficulty concentrating or making decisions? Y  Walking or climbing stairs? Y  Dressing or bathing? Y  Doing errands, shopping? Y  Preparing Food and eating ? Y  Using the Toilet? Y  In the past six months, have you accidently leaked urine? Y  Do you have problems with loss of bowel control? Y  Managing  your Medications? Y  Managing your Finances? Y  Housekeeping or managing your Housekeeping? Y  Some recent data might be hidden    Patient Care Team: Margit Hanks, MD as PCP - General (Internal Medicine) Ronne Binning Mardene Celeste, MD as Consulting Physician (Urology)   Assessment:     Exercise Activities and Dietary recommendations Current Exercise Habits: The patient does not participate in regular exercise at present, Exercise limited by: neurologic condition(s)  Goals    None     Fall Risk Fall Risk  05/05/2017 08/22/2015  Falls in the past year? No Yes  Number falls in past yr: - 2 or more  Injury with Fall? - Yes  Risk Factor Category  - High Fall Risk  Risk for fall due to : - Impaired balance/gait;Impaired mobility  Follow up - Falls evaluation completed;Falls prevention discussed   Depression Screen PHQ 2/9 Scores 05/05/2017  PHQ - 2 Score 0    Cognitive Function     6CIT Screen 05/05/2017  What Year? 4 points  What month? 3 points  What time? 0 points  Count back from 20 0 points  Months in reverse 4 points  Repeat phrase 10 points  Total Score 21    Immunization History  Administered Date(s) Administered  . Influenza,inj,Quad PF,36+ Mos 07/05/2015  . Influenza-Unspecified 07/17/2016  . PPD Test 11/09/2015  . Pneumococcal Polysaccharide-23 07/05/2015  . Tdap 03/31/2015   Screening Tests Health Maintenance  Topic Date Due  . INFLUENZA VACCINE  05/19/2017  . TETANUS/TDAP  03/30/2025  . PNA vac Low Risk Adult  Completed      Plan:    I have personally reviewed and addressed the Medicare Annual Wellness questionnaire and have noted the following in the patient's chart:  A. Medical and social history B. Use of alcohol, tobacco or illicit drugs  C. Current medications and supplements D. Functional ability and status E.  Nutritional status F.  Physical activity G. Advance directives H. List of other physicians I.  Hospitalizations, surgeries, and  ER visits in previous 12 months J.  Vitals K. Screenings to include hearing, vision, cognitive, depression L. Referrals and appointments - none  In addition, I have reviewed and discussed with patient certain preventive protocols, quality metrics, and best practice recommendations. A written personalized care plan for preventive services as well as general preventive health recommendations were provided to patient.  See attached scanned questionnaire for additional information.   Signed,   Annetta Maw, RN Nurse Health Advisor  Quick Notes   Health Maintenance: up to date     Abnormal Screen: 6 CIT-21     Patient Concerns: None     Nurse Concerns: None

## 2017-05-06 DIAGNOSIS — R2241 Localized swelling, mass and lump, right lower limb: Secondary | ICD-10-CM | POA: Diagnosis not present

## 2017-05-06 DIAGNOSIS — M6281 Muscle weakness (generalized): Secondary | ICD-10-CM | POA: Diagnosis not present

## 2017-05-06 DIAGNOSIS — R2681 Unsteadiness on feet: Secondary | ICD-10-CM | POA: Diagnosis not present

## 2017-05-06 DIAGNOSIS — G7000 Myasthenia gravis without (acute) exacerbation: Secondary | ICD-10-CM | POA: Diagnosis not present

## 2017-05-06 DIAGNOSIS — R293 Abnormal posture: Secondary | ICD-10-CM | POA: Diagnosis not present

## 2017-05-06 LAB — CBC AND DIFFERENTIAL
HCT: 42 (ref 41–53)
HEMOGLOBIN: 14.5 (ref 13.5–17.5)
PLATELETS: 484 — AB (ref 150–399)
WBC: 11.9

## 2017-05-07 DIAGNOSIS — R2681 Unsteadiness on feet: Secondary | ICD-10-CM | POA: Diagnosis not present

## 2017-05-07 DIAGNOSIS — G7000 Myasthenia gravis without (acute) exacerbation: Secondary | ICD-10-CM | POA: Diagnosis not present

## 2017-05-07 DIAGNOSIS — R293 Abnormal posture: Secondary | ICD-10-CM | POA: Diagnosis not present

## 2017-05-07 DIAGNOSIS — M6281 Muscle weakness (generalized): Secondary | ICD-10-CM | POA: Diagnosis not present

## 2017-05-08 DIAGNOSIS — M6281 Muscle weakness (generalized): Secondary | ICD-10-CM | POA: Diagnosis not present

## 2017-05-08 DIAGNOSIS — R2681 Unsteadiness on feet: Secondary | ICD-10-CM | POA: Diagnosis not present

## 2017-05-08 DIAGNOSIS — R293 Abnormal posture: Secondary | ICD-10-CM | POA: Diagnosis not present

## 2017-05-08 DIAGNOSIS — G7000 Myasthenia gravis without (acute) exacerbation: Secondary | ICD-10-CM | POA: Diagnosis not present

## 2017-05-09 ENCOUNTER — Encounter: Payer: Self-pay | Admitting: Internal Medicine

## 2017-05-09 NOTE — Assessment & Plan Note (Signed)
Vitamin D level was 49 in April; plan to decrease replacement to 1000 units daily

## 2017-05-09 NOTE — Assessment & Plan Note (Signed)
Results recent LDL 77, HDL 44; good control; plan to continue Zocor 10 mg by mouth daily

## 2017-05-09 NOTE — Assessment & Plan Note (Signed)
No reported recent infections; patient with suprapubic catheter; plan to continue Myrbetriq 24-hour tablet 25 mg by mouth daily

## 2017-05-10 DIAGNOSIS — R293 Abnormal posture: Secondary | ICD-10-CM | POA: Diagnosis not present

## 2017-05-10 DIAGNOSIS — R2681 Unsteadiness on feet: Secondary | ICD-10-CM | POA: Diagnosis not present

## 2017-05-10 DIAGNOSIS — F0390 Unspecified dementia without behavioral disturbance: Secondary | ICD-10-CM | POA: Diagnosis not present

## 2017-05-10 DIAGNOSIS — F331 Major depressive disorder, recurrent, moderate: Secondary | ICD-10-CM | POA: Diagnosis not present

## 2017-05-10 DIAGNOSIS — M6281 Muscle weakness (generalized): Secondary | ICD-10-CM | POA: Diagnosis not present

## 2017-05-10 DIAGNOSIS — F329 Major depressive disorder, single episode, unspecified: Secondary | ICD-10-CM | POA: Diagnosis not present

## 2017-05-10 DIAGNOSIS — G7000 Myasthenia gravis without (acute) exacerbation: Secondary | ICD-10-CM | POA: Diagnosis not present

## 2017-05-11 DIAGNOSIS — G7000 Myasthenia gravis without (acute) exacerbation: Secondary | ICD-10-CM | POA: Diagnosis not present

## 2017-05-11 DIAGNOSIS — R293 Abnormal posture: Secondary | ICD-10-CM | POA: Diagnosis not present

## 2017-05-11 DIAGNOSIS — R2681 Unsteadiness on feet: Secondary | ICD-10-CM | POA: Diagnosis not present

## 2017-05-11 DIAGNOSIS — M6281 Muscle weakness (generalized): Secondary | ICD-10-CM | POA: Diagnosis not present

## 2017-05-12 ENCOUNTER — Encounter: Payer: Self-pay | Admitting: Internal Medicine

## 2017-05-12 ENCOUNTER — Non-Acute Institutional Stay (SKILLED_NURSING_FACILITY): Payer: Medicare Other | Admitting: Internal Medicine

## 2017-05-12 DIAGNOSIS — G7000 Myasthenia gravis without (acute) exacerbation: Secondary | ICD-10-CM

## 2017-05-12 DIAGNOSIS — R293 Abnormal posture: Secondary | ICD-10-CM | POA: Diagnosis not present

## 2017-05-12 DIAGNOSIS — I10 Essential (primary) hypertension: Secondary | ICD-10-CM

## 2017-05-12 DIAGNOSIS — N183 Chronic kidney disease, stage 3 unspecified: Secondary | ICD-10-CM

## 2017-05-12 DIAGNOSIS — M6281 Muscle weakness (generalized): Secondary | ICD-10-CM | POA: Diagnosis not present

## 2017-05-12 DIAGNOSIS — R2681 Unsteadiness on feet: Secondary | ICD-10-CM | POA: Diagnosis not present

## 2017-05-12 NOTE — Progress Notes (Signed)
[facility] Room Number: 405 Place of Service:  SNF [phone])  Margit HanksAlexander, Oseias Horsey D, MD  Patient Care Team: Margit HanksAlexander, Rickey Sadowski D, MD as PCP - General (Internal Medicine) McKenzie, Mardene CelestePatrick L, MD as Consulting Physician (Urology)  Extended Emergency Contact Information Primary Emergency Contact: Roston,David & Neoma LamingEllen  [place] Home Phone: [phone] Mobile Phone: [phone] Relation: Son Secondary Emergency Contact: Richardean ChimeraSmoak,Mark  [place] Home Phone: [phone] Relation: None    Allergies: Ceftin [cefuroxime]  Chief Complaint  Patient presents with  . Medical Management of Chronic Issues    routine visit    HPI: Patient is 83 y.o. male who Is being seen for routine issues of chronic kidney disease stage III, myasthenia gravis, and hypertension.  Past Medical History:  Diagnosis Date  . A-fib (HCC)   . AF (atrial fibrillation) (HCC) 03/31/2015  . Anorexia   . Aortic stenosis   . Arthritis   . Ataxia 05/29/2015  . Atrial fibrillation (HCC)   . Bacteremia due to Klebsiella pneumoniae 08/07/2015  . Candida UTI 10/01/2015  . Cerebral brain hemorrhage (HCC) 05/29/2015  . Chronic kidney disease   . CKD (chronic kidney disease), stage III 03/31/2015  . Dementia    short term memory loss  . Depression 08/22/2016  . Encephalopathy acute   . Frequent falls 11/05/2015  . Gangrene (HCC)    perineal  . High cholesterol   . History of TIAs   . Hyperlipidemia   . Hypertension   . Major depressive disorder   . Multiple falls   . Myasthenia gravis (HCC)   . Neurogenic bladder 09/25/2015  . Sepsis due to Klebsiella pneumoniae (HCC) 08/07/2015  . Urinary incontinence   . Vertigo   . Vitamin D deficiency   . Weakness 05/29/2015    Past Surgical History:  Procedure Laterality Date  . BACK SURGERY  2006  . CERVICAL LAMINECTOMY  2002 ?  [name]. CYSTOSCOPY N/A 06/26/2015   Procedure: CYSTOSCOPY;  Surgeon: Malen GauzePatrick L  McKenzie, MD;  Location: [facility];  Service: Urology;  Laterality: N/A;  . HERNIA REPAIR     1970s  . INSERTION OF SUPRAPUBIC CATHETER N/A 06/26/2015   Procedure: INSERTION OF SUPRAPUBIC CATHETER;  Surgeon: Malen GauzePatrick L McKenzie, MD;  Location: [facility];  Service: Urology;  Laterality: N/A;    Allergies as of 05/12/2017      Reactions   Ceftin [cefuroxime] Diarrhea   Severe diarrhea      Medication List       Accurate as of 05/12/17 11:59 PM. Always use your most recent med list.          acetaminophen 500 MG tablet Commonly known as:  TYLENOL Take 500 mg by mouth every 6 (six) hours as needed for mild pain.   docusate sodium 100 MG capsule Commonly known as:  COLACE Take 100 mg by mouth 2 (two) times daily.   ELIQUIS 2.5 MG Tabs tablet Generic drug:  apixaban Take 2.5 mg by mouth 2 (two) times daily.   metoprolol succinate 50 MG 24 hr tablet Commonly known as:  TOPROL-XL Take 50 mg by mouth every morning. Take with or immediately following a meal.   MYRBETRIQ 25 MG Tb24 tablet Generic drug:  mirabegron ER Take 25 mg by mouth daily.   NUTRITIONAL SUPPLEMENT PO Offer Magic Cup as bedtime snack related to weight loss   ENSURE Take 237 mLs by mouth 3 (three) times daily between meals.   omeprazole  20 MG capsule Commonly known as:  PRILOSEC Take 20 mg by mouth daily.   predniSONE 10 MG tablet Commonly known as:  DELTASONE Take 1 tablet (10 mg total) by mouth daily with breakfast.   pyridostigmine 60 MG tablet Commonly known as:  MESTINON Take 60 mg by mouth 3 (three) times daily.   senna 8.6 MG tablet Commonly known as:  SENOKOT Take 1 tablet by mouth 2 (two) times daily.   simvastatin 10 MG tablet Commonly known as:  ZOCOR Take 10 mg by mouth daily.   TAB-A-VITE Tabs Take 1 tablet by mouth daily.   Vitamin D3 2000 units Tabs Take 2,000 Units by mouth daily.       No orders of the defined types were placed in this encounter.   Immunization History    Administered Date(s) Administered  . Influenza,inj,Quad PF,36+ Mos 07/05/2015  . Influenza-Unspecified 07/17/2016  . PPD Test 11/09/2015  . Pneumococcal Polysaccharide-23 07/05/2015  . Tdap 03/31/2015    Social History  Substance Use Topics  . Smoking status: Former Smoker    Types: Cigars  . Smokeless tobacco: Never Used  . Alcohol use No    Review of Systems  DATA OBTAINED: from patient, nurse GENERAL:  no fevers, fatigue, appetite changes SKIN: No itching, rash HEENT: No complaint RESPIRATORY: No cough, wheezing, SOB CARDIAC: No chest pain, palpitations, lower extremity edema  GI: No abdominal pain, No N/V/D or constipation, No heartburn or reflux  GU: No dysuria, frequency or urgency, or incontinence  MUSCULOSKELETAL: No unrelieved bone/joint pain NEUROLOGIC: No headache, dizziness  PSYCHIATRIC: No overt anxiety or sadness  Vitals:   05/12/17 1316  BP: 119/67  Pulse: 61  Resp: 18  Temp: (!) 97.2 F (36.2 C)   Body mass index is 25.74 kg/m. Physical Exam  GENERAL APPEARANCE: Alert, conversant, No acute distress  SKIN: No diaphoresis rash HEENT: Unremarkable RESPIRATORY: Breathing is even, unlabored. Lung sounds are clear   CARDIOVASCULAR: Heart RRR no murmurs, rubs or gallops. No peripheral edema  GASTROINTESTINAL: Abdomen is soft, non-tender, not distended w/ normal bowel sounds.  GENITOURINARY: Bladder non tender, not distended  MUSCULOSKELETAL: No abnormal joints or musculature NEUROLOGIC: Cranial nerves 2-12 grossly intact. Moves all extremities PSYCHIATRIC: Mood and affect appropriate to situationWith mild dementia, no behavioral issues  Patient Active Problem List   Diagnosis Date Noted  . Hyperlipidemia 05/01/2017  . Abnormality on screening test 12/26/2016  . Current use of long term anticoagulation - Eliquis for A fib 12/16/2016  . Depression 08/22/2016  . Vitamin D deficiency 07/11/2016  . Encounter for family conference with patient present  12/14/2015  . Frequent falls 11/05/2015  . FTT (failure to thrive) in adult 09/25/2015  . Neurogenic bladder 09/25/2015  . DNR (do not resuscitate) 08/06/2015  . Catheter-associated urinary tract infection (HCC) 08/03/2015  . Myasthenia gravis (HCC) 07/13/2015  . Dementia 07/04/2015  . Pressure ulcer 07/04/2015  . Dizziness and giddiness 05/29/2015  . Weakness 05/29/2015  . Proximal limb muscle weakness 05/29/2015  . Ataxia 05/29/2015  . CKD (chronic kidney disease), stage III 03/31/2015  . HTN (hypertension) 03/31/2015  . AF (atrial fibrillation) (HCC) 03/31/2015  . Dyslipidemia 03/31/2015    CMP     Component Value Date/Time   NA 145 02/15/2017   K 4.3 02/15/2017   CL 113 (H) 11/09/2015 0710   CO2 24 11/09/2015 0710   GLUCOSE 106 (H) 11/09/2015 0710   BUN 16 02/15/2017   CREATININE 0.9 02/15/2017   CREATININE 0.76 11/09/2015 0710  CALCIUM 8.4 (L) 11/09/2015 0710   PROT 5.1 (L) 11/06/2015 0330   PROT 7.7 05/29/2015 1319   ALBUMIN 2.5 (L) 11/06/2015 0330   ALBUMIN 4.0 05/29/2015 1319   AST 23 02/15/2017   ALT 26 02/15/2017   ALKPHOS 76 02/15/2017   BILITOT 0.8 11/06/2015 0330   BILITOT 1.2 05/29/2015 1319   GFRNONAA >60 11/09/2015 0710   GFRAA >60 11/09/2015 0710    Recent Labs  08/25/16 02/15/17  NA 144 145  K 4.2 4.3  BUN 13 16  CREATININE 0.9 0.9    Recent Labs  08/25/16 02/15/17  AST 17 23  ALT 22 26  ALKPHOS 73 76    Recent Labs  08/25/16 02/15/17  WBC 9.0 9.2  HGB 13.7 14.3  HCT 44 42  PLT 308 384    Recent Labs  08/25/16 02/15/17  CHOL 144 143  LDLCALC 64 77  TRIG 156 113   No results found for: MICROALBUR Lab Results  Component Value Date   TSH 0.86 02/15/2017   Lab Results  Component Value Date   HGBA1C 6.7 02/15/2017   Lab Results  Component Value Date   CHOL 143 02/15/2017   HDL 44 02/15/2017   LDLCALC 77 02/15/2017   TRIG 113 02/15/2017   CHOLHDL 9.3 01/20/2015    Significant Diagnostic Results in last 30 days:    No results found.  Assessment and Plan  CKD (chronic kidney disease), stage III No recent GFR but BUN and creatinine most recent 16/0.9 which is stable from the one 6 months prior; excellent for patient is age; we'll monitor intervals  Myasthenia gravis (HCC) I never see any evidence of problems; stable; plan to continue Mestinon 30 mg by mouth 3 times a day  HTN (hypertension) Well controlled. Plan to continue Toprol-XL 50 mg by mouth daily    Randon Goldsmithnne D. Lyn HollingsheadAlexander, MD

## 2017-05-13 DIAGNOSIS — R2681 Unsteadiness on feet: Secondary | ICD-10-CM | POA: Diagnosis not present

## 2017-05-13 DIAGNOSIS — R293 Abnormal posture: Secondary | ICD-10-CM | POA: Diagnosis not present

## 2017-05-13 DIAGNOSIS — M6281 Muscle weakness (generalized): Secondary | ICD-10-CM | POA: Diagnosis not present

## 2017-05-13 DIAGNOSIS — G7000 Myasthenia gravis without (acute) exacerbation: Secondary | ICD-10-CM | POA: Diagnosis not present

## 2017-05-15 DIAGNOSIS — R2681 Unsteadiness on feet: Secondary | ICD-10-CM | POA: Diagnosis not present

## 2017-05-15 DIAGNOSIS — R293 Abnormal posture: Secondary | ICD-10-CM | POA: Diagnosis not present

## 2017-05-15 DIAGNOSIS — G7000 Myasthenia gravis without (acute) exacerbation: Secondary | ICD-10-CM | POA: Diagnosis not present

## 2017-05-15 DIAGNOSIS — M6281 Muscle weakness (generalized): Secondary | ICD-10-CM | POA: Diagnosis not present

## 2017-05-16 DIAGNOSIS — R293 Abnormal posture: Secondary | ICD-10-CM | POA: Diagnosis not present

## 2017-05-16 DIAGNOSIS — G7000 Myasthenia gravis without (acute) exacerbation: Secondary | ICD-10-CM | POA: Diagnosis not present

## 2017-05-16 DIAGNOSIS — R2681 Unsteadiness on feet: Secondary | ICD-10-CM | POA: Diagnosis not present

## 2017-05-16 DIAGNOSIS — M6281 Muscle weakness (generalized): Secondary | ICD-10-CM | POA: Diagnosis not present

## 2017-05-17 DIAGNOSIS — G7000 Myasthenia gravis without (acute) exacerbation: Secondary | ICD-10-CM | POA: Diagnosis not present

## 2017-05-17 DIAGNOSIS — M6281 Muscle weakness (generalized): Secondary | ICD-10-CM | POA: Diagnosis not present

## 2017-05-17 DIAGNOSIS — R2681 Unsteadiness on feet: Secondary | ICD-10-CM | POA: Diagnosis not present

## 2017-05-17 DIAGNOSIS — R293 Abnormal posture: Secondary | ICD-10-CM | POA: Diagnosis not present

## 2017-05-21 DIAGNOSIS — M25561 Pain in right knee: Secondary | ICD-10-CM | POA: Diagnosis not present

## 2017-05-25 DIAGNOSIS — R339 Retention of urine, unspecified: Secondary | ICD-10-CM | POA: Diagnosis not present

## 2017-05-26 DIAGNOSIS — R2681 Unsteadiness on feet: Secondary | ICD-10-CM | POA: Diagnosis not present

## 2017-05-26 DIAGNOSIS — G7000 Myasthenia gravis without (acute) exacerbation: Secondary | ICD-10-CM | POA: Diagnosis not present

## 2017-05-26 DIAGNOSIS — R488 Other symbolic dysfunctions: Secondary | ICD-10-CM | POA: Diagnosis not present

## 2017-06-06 ENCOUNTER — Encounter: Payer: Self-pay | Admitting: Internal Medicine

## 2017-06-06 NOTE — Assessment & Plan Note (Signed)
No recent GFR but BUN and creatinine most recent 16/0.9 which is stable from the one 6 months prior; excellent for patient is age; we'll monitor intervals

## 2017-06-06 NOTE — Assessment & Plan Note (Signed)
Well controlled. Plan to continue Toprol-XL 50 mg by mouth daily

## 2017-06-06 NOTE — Assessment & Plan Note (Signed)
I never see any evidence of problems; stable; plan to continue Mestinon 30 mg by mouth 3 times a day

## 2017-06-08 DIAGNOSIS — R2681 Unsteadiness on feet: Secondary | ICD-10-CM | POA: Diagnosis not present

## 2017-06-08 DIAGNOSIS — R488 Other symbolic dysfunctions: Secondary | ICD-10-CM | POA: Diagnosis not present

## 2017-06-08 DIAGNOSIS — G7000 Myasthenia gravis without (acute) exacerbation: Secondary | ICD-10-CM | POA: Diagnosis not present

## 2017-06-09 DIAGNOSIS — R488 Other symbolic dysfunctions: Secondary | ICD-10-CM | POA: Diagnosis not present

## 2017-06-09 DIAGNOSIS — G7000 Myasthenia gravis without (acute) exacerbation: Secondary | ICD-10-CM | POA: Diagnosis not present

## 2017-06-09 DIAGNOSIS — R2681 Unsteadiness on feet: Secondary | ICD-10-CM | POA: Diagnosis not present

## 2017-06-10 DIAGNOSIS — R488 Other symbolic dysfunctions: Secondary | ICD-10-CM | POA: Diagnosis not present

## 2017-06-10 DIAGNOSIS — G7000 Myasthenia gravis without (acute) exacerbation: Secondary | ICD-10-CM | POA: Diagnosis not present

## 2017-06-10 DIAGNOSIS — R2681 Unsteadiness on feet: Secondary | ICD-10-CM | POA: Diagnosis not present

## 2017-06-11 DIAGNOSIS — R488 Other symbolic dysfunctions: Secondary | ICD-10-CM | POA: Diagnosis not present

## 2017-06-11 DIAGNOSIS — R2681 Unsteadiness on feet: Secondary | ICD-10-CM | POA: Diagnosis not present

## 2017-06-11 DIAGNOSIS — G7000 Myasthenia gravis without (acute) exacerbation: Secondary | ICD-10-CM | POA: Diagnosis not present

## 2017-06-14 ENCOUNTER — Non-Acute Institutional Stay (SKILLED_NURSING_FACILITY): Payer: Medicare Other | Admitting: Internal Medicine

## 2017-06-14 ENCOUNTER — Encounter: Payer: Self-pay | Admitting: Internal Medicine

## 2017-06-14 DIAGNOSIS — N319 Neuromuscular dysfunction of bladder, unspecified: Secondary | ICD-10-CM

## 2017-06-14 DIAGNOSIS — E785 Hyperlipidemia, unspecified: Secondary | ICD-10-CM | POA: Diagnosis not present

## 2017-06-14 DIAGNOSIS — E559 Vitamin D deficiency, unspecified: Secondary | ICD-10-CM

## 2017-06-14 DIAGNOSIS — G7000 Myasthenia gravis without (acute) exacerbation: Secondary | ICD-10-CM | POA: Diagnosis not present

## 2017-06-14 DIAGNOSIS — R488 Other symbolic dysfunctions: Secondary | ICD-10-CM | POA: Diagnosis not present

## 2017-06-14 DIAGNOSIS — R2681 Unsteadiness on feet: Secondary | ICD-10-CM | POA: Diagnosis not present

## 2017-06-14 NOTE — Progress Notes (Signed)
Location:  [facility] Room Number: 405 Place of Service:  SNF [phone])  Margit Hanks, MD  Patient Care Team: Margit Hanks, MD as PCP - General (Internal Medicine) McKenzie, Mardene Celeste, MD as Consulting Physician (Urology)  Extended Emergency Contact Information Primary Emergency Contact: Schupp,David & Neoma [place] Home Phone: [phone] Mobile Phone: [phone] Relation: Son Secondary Emergency Contact: Richardean Chimera [place] Home Phone: [phone] Relation: None    Allergies: Ceftin [cefuroxime]  Chief Complaint  Patient presents with  . Medical Management of Chronic Issues    routine visit    HPI: Patient is 83 y.o. male who Is being seen for routine issues of vitamin D deficiency, neurogenic bladder, and hyperlipidemia.  Past Medical History:  Diagnosis Date  . A-fib (HCC)   . AF (atrial fibrillation) (HCC) 03/31/2015  . Anorexia   . Aortic stenosis   . Arthritis   . Ataxia 05/29/2015  . Atrial fibrillation (HCC)   . Bacteremia due to Klebsiella pneumoniae 08/07/2015  . Candida UTI 10/01/2015  . Cerebral brain hemorrhage (HCC) 05/29/2015  . Chronic kidney disease   . CKD (chronic kidney disease), stage III 03/31/2015  . Dementia    short term memory loss  . Depression 08/22/2016  . Encephalopathy acute   . Frequent falls 11/05/2015  . Gangrene (HCC)    perineal  . High cholesterol   . History of TIAs   . Hyperlipidemia   . Hypertension   . Major depressive disorder   . Multiple falls   . Myasthenia gravis (HCC)   . Neurogenic bladder 09/25/2015  . Sepsis due to Klebsiella pneumoniae (HCC) 08/07/2015  . Urinary incontinence   . Vertigo   . Vitamin D deficiency   . Weakness 05/29/2015    Past Surgical History:  Procedure Laterality Date  . BACK SURGERY  2006  . CERVICAL LAMINECTOMY  2002 ?  [name] CYSTOSCOPY N/A 06/26/2015   Procedure: CYSTOSCOPY;  Surgeon: Malen Gauze, MD;   Location: [facility];  Service: Urology;  Laterality: N/A;  . HERNIA REPAIR     1970s  . INSERTION OF SUPRAPUBIC CATHETER N/A 06/26/2015   Procedure: INSERTION OF SUPRAPUBIC CATHETER;  Surgeon: Malen Gauze, MD;  Location: [facility];  Service: Urology;  Laterality: N/A;    Allergies as of 06/14/2017      Reactions   Ceftin [cefuroxime] Diarrhea   Severe diarrhea      Medication List       Accurate as of 06/14/17 11:59 PM. Always use your most recent med list.          acetaminophen 500 MG tablet Commonly known as:  TYLENOL Take 500 mg by mouth every 6 (six) hours as needed for mild pain.   docusate sodium 100 MG capsule Commonly known as:  COLACE Take 100 mg by mouth 2 (two) times daily.   ELIQUIS 2.5 MG Tabs tablet Generic drug:  apixaban Take 2.5 mg by mouth 2 (two) times daily.   metoprolol succinate 50 MG 24 hr tablet Commonly known as:  TOPROL-XL Take 50 mg by mouth every morning. Take with or immediately following a meal.   MYRBETRIQ 25 MG Tb24 tablet Generic drug:  mirabegron ER Take 25 mg by mouth daily.   NUTRITIONAL SUPPLEMENT PO Offer Magic Cup as bedtime snack related to weight loss   ENSURE Take 237 mLs by mouth 3 (three) times daily between meals.   omeprazole 20 MG  capsule Commonly known as:  PRILOSEC Take 20 mg by mouth daily.   predniSONE 10 MG tablet Commonly known as:  DELTASONE Take 1 tablet (10 mg total) by mouth daily with breakfast.   pyridostigmine 60 MG tablet Commonly known as:  MESTINON Take 60 mg by mouth 3 (three) times daily.   senna 8.6 MG tablet Commonly known as:  SENOKOT Take 1 tablet by mouth 2 (two) times daily.   simvastatin 10 MG tablet Commonly known as:  ZOCOR Take 10 mg by mouth daily.   TAB-A-VITE Tabs Take 1 tablet by mouth daily.   Vitamin D3 2000 units Tabs Take 2,000 Units by mouth daily.            Discharge Care Instructions        Start     Ordered   06/14/17 0000  CBC and differential      Comments:  This external order was created through the Results Console.    06/14/17 1444      No orders of the defined types were placed in this encounter.   Immunization History  Administered Date(s) Administered  . Influenza,inj,Quad PF,6+ Mos 07/05/2015  . Influenza-Unspecified 07/17/2016  . PPD Test 11/09/2015  . Pneumococcal Polysaccharide-23 07/05/2015  . Tdap 03/31/2015    Social History  Substance Use Topics  . Smoking status: Former Smoker    Types: Cigars  . Smokeless tobacco: Never Used  . Alcohol use No    Review of Systems  DATA OBTAINED: from patient, nurse GENERAL:  no fevers, fatigue, appetite changes SKIN: No itching, rash HEENT: No complaint RESPIRATORY: No cough, wheezing, SOB CARDIAC: No chest pain, palpitations, lower extremity edema  GI: No abdominal pain, No N/V/D or constipation, No heartburn or reflux  GU: No dysuria, frequency or urgency, or incontinence  MUSCULOSKELETAL: No unrelieved bone/joint pain NEUROLOGIC: No headache, dizziness  PSYCHIATRIC: No overt anxiety or sadness  Vitals:   06/14/17 1432  BP: 98/72  Pulse: 75  Resp: 18  Temp: 98.5 F (36.9 C)   Body mass index is 25.68 kg/m. Physical Exam  GENERAL APPEARANCE: Alert, conversant, No acute distress  SKIN: No diaphoresis rash HEENT: Unremarkable RESPIRATORY: Breathing is even, unlabored. Lung sounds are clear   CARDIOVASCULAR: Heart RRR no murmurs, rubs or gallops. No peripheral edema  GASTROINTESTINAL: Abdomen is soft, non-tender, not distended w/ normal bowel sounds.  GENITOURINARY: Bladder non tender, not distended  MUSCULOSKELETAL: No abnormal joints or musculature NEUROLOGIC: Cranial nerves 2-12 grossly intact. Moves all extremities PSYCHIATRIC: Mood and affect appropriate to situationWith mild dementia, no behavioral issues  Physical examination has not changed since prior visit.  Patient Active Problem List   Diagnosis Date Noted  . Hyperlipidemia  05/01/2017  . Abnormality on screening test 12/26/2016  . Current use of long term anticoagulation - Eliquis for A fib 12/16/2016  . Depression 08/22/2016  . Vitamin D deficiency 07/11/2016  . Encounter for family conference with patient present 12/14/2015  . Frequent falls 11/05/2015  . FTT (failure to thrive) in adult 09/25/2015  . Neurogenic bladder 09/25/2015  . DNR (do not resuscitate) 08/06/2015  . Catheter-associated urinary tract infection (HCC) 08/03/2015  . Myasthenia gravis (HCC) 07/13/2015  . Dementia 07/04/2015  . Pressure ulcer 07/04/2015  . Dizziness and giddiness 05/29/2015  . Weakness 05/29/2015  . Proximal limb muscle weakness 05/29/2015  . Ataxia 05/29/2015  . CKD (chronic kidney disease), stage III 03/31/2015  . HTN (hypertension) 03/31/2015  . AF (atrial fibrillation) (HCC) 03/31/2015  .  Dyslipidemia 03/31/2015    CMP     Component Value Date/Time   NA 145 02/15/2017   K 4.3 02/15/2017   CL 113 (H) 11/09/2015 0710   CO2 24 11/09/2015 0710   GLUCOSE 106 (H) 11/09/2015 0710   BUN 16 02/15/2017   CREATININE 0.9 02/15/2017   CREATININE 0.76 11/09/2015 0710   CALCIUM 8.4 (L) 11/09/2015 0710   PROT 5.1 (L) 11/06/2015 0330   PROT 7.7 05/29/2015 1319   ALBUMIN 2.5 (L) 11/06/2015 0330   ALBUMIN 4.0 05/29/2015 1319   AST 23 02/15/2017   ALT 26 02/15/2017   ALKPHOS 76 02/15/2017   BILITOT 0.8 11/06/2015 0330   BILITOT 1.2 05/29/2015 1319   GFRNONAA >60 11/09/2015 0710   GFRAA >60 11/09/2015 0710    Recent Labs  08/25/16 02/15/17  NA 144 145  K 4.2 4.3  BUN 13 16  CREATININE 0.9 0.9    Recent Labs  08/25/16 02/15/17  AST 17 23  ALT 22 26  ALKPHOS 73 76    Recent Labs  08/25/16 02/15/17 05/06/17  WBC 9.0 9.2 11.9  HGB 13.7 14.3 14.5  HCT 44 42 42  PLT 308 384 484*    Recent Labs  08/25/16 02/15/17  CHOL 144 143  LDLCALC 64 77  TRIG 156 113   No results found for: MICROALBUR Lab Results  Component Value Date   TSH 0.86 02/15/2017    Lab Results  Component Value Date   HGBA1C 6.7 02/15/2017   Lab Results  Component Value Date   CHOL 143 02/15/2017   HDL 44 02/15/2017   LDLCALC 77 02/15/2017   TRIG 113 02/15/2017   CHOLHDL 9.3 01/20/2015    Significant Diagnostic Results in last 30 days:  No results found.  Assessment and Plan  Vitamin D deficiency Stable; continue replacement in 1000 and units daily  Hyperlipidemia Most recent LDL 77, most recent HDL 44, both excellent; continue  Zocor 10 mg by mouth daily    Merrilee Seashore, MD

## 2017-06-17 DIAGNOSIS — F0390 Unspecified dementia without behavioral disturbance: Secondary | ICD-10-CM | POA: Diagnosis not present

## 2017-06-17 DIAGNOSIS — G7000 Myasthenia gravis without (acute) exacerbation: Secondary | ICD-10-CM | POA: Diagnosis not present

## 2017-06-17 DIAGNOSIS — R488 Other symbolic dysfunctions: Secondary | ICD-10-CM | POA: Diagnosis not present

## 2017-06-17 DIAGNOSIS — R2681 Unsteadiness on feet: Secondary | ICD-10-CM | POA: Diagnosis not present

## 2017-06-17 DIAGNOSIS — F329 Major depressive disorder, single episode, unspecified: Secondary | ICD-10-CM | POA: Diagnosis not present

## 2017-06-17 DIAGNOSIS — F419 Anxiety disorder, unspecified: Secondary | ICD-10-CM | POA: Diagnosis not present

## 2017-06-18 DIAGNOSIS — R488 Other symbolic dysfunctions: Secondary | ICD-10-CM | POA: Diagnosis not present

## 2017-06-18 DIAGNOSIS — G7000 Myasthenia gravis without (acute) exacerbation: Secondary | ICD-10-CM | POA: Diagnosis not present

## 2017-06-18 DIAGNOSIS — R2681 Unsteadiness on feet: Secondary | ICD-10-CM | POA: Diagnosis not present

## 2017-06-19 DIAGNOSIS — R488 Other symbolic dysfunctions: Secondary | ICD-10-CM | POA: Diagnosis not present

## 2017-06-19 DIAGNOSIS — G7000 Myasthenia gravis without (acute) exacerbation: Secondary | ICD-10-CM | POA: Diagnosis not present

## 2017-06-20 DIAGNOSIS — R488 Other symbolic dysfunctions: Secondary | ICD-10-CM | POA: Diagnosis not present

## 2017-06-20 DIAGNOSIS — G7000 Myasthenia gravis without (acute) exacerbation: Secondary | ICD-10-CM | POA: Diagnosis not present

## 2017-06-21 DIAGNOSIS — G7000 Myasthenia gravis without (acute) exacerbation: Secondary | ICD-10-CM | POA: Diagnosis not present

## 2017-06-21 DIAGNOSIS — R488 Other symbolic dysfunctions: Secondary | ICD-10-CM | POA: Diagnosis not present

## 2017-06-22 DIAGNOSIS — G7000 Myasthenia gravis without (acute) exacerbation: Secondary | ICD-10-CM | POA: Diagnosis not present

## 2017-06-22 DIAGNOSIS — R338 Other retention of urine: Secondary | ICD-10-CM | POA: Diagnosis not present

## 2017-06-22 DIAGNOSIS — R488 Other symbolic dysfunctions: Secondary | ICD-10-CM | POA: Diagnosis not present

## 2017-06-23 DIAGNOSIS — R488 Other symbolic dysfunctions: Secondary | ICD-10-CM | POA: Diagnosis not present

## 2017-06-23 DIAGNOSIS — G7000 Myasthenia gravis without (acute) exacerbation: Secondary | ICD-10-CM | POA: Diagnosis not present

## 2017-06-26 DIAGNOSIS — R488 Other symbolic dysfunctions: Secondary | ICD-10-CM | POA: Diagnosis not present

## 2017-06-26 DIAGNOSIS — G7000 Myasthenia gravis without (acute) exacerbation: Secondary | ICD-10-CM | POA: Diagnosis not present

## 2017-06-27 DIAGNOSIS — R488 Other symbolic dysfunctions: Secondary | ICD-10-CM | POA: Diagnosis not present

## 2017-06-27 DIAGNOSIS — G7000 Myasthenia gravis without (acute) exacerbation: Secondary | ICD-10-CM | POA: Diagnosis not present

## 2017-06-28 DIAGNOSIS — G7000 Myasthenia gravis without (acute) exacerbation: Secondary | ICD-10-CM | POA: Diagnosis not present

## 2017-06-28 DIAGNOSIS — R488 Other symbolic dysfunctions: Secondary | ICD-10-CM | POA: Diagnosis not present

## 2017-06-29 DIAGNOSIS — R488 Other symbolic dysfunctions: Secondary | ICD-10-CM | POA: Diagnosis not present

## 2017-06-29 DIAGNOSIS — G7000 Myasthenia gravis without (acute) exacerbation: Secondary | ICD-10-CM | POA: Diagnosis not present

## 2017-06-30 DIAGNOSIS — R488 Other symbolic dysfunctions: Secondary | ICD-10-CM | POA: Diagnosis not present

## 2017-06-30 DIAGNOSIS — G7000 Myasthenia gravis without (acute) exacerbation: Secondary | ICD-10-CM | POA: Diagnosis not present

## 2017-07-01 ENCOUNTER — Encounter: Payer: Self-pay | Admitting: Internal Medicine

## 2017-07-01 DIAGNOSIS — R488 Other symbolic dysfunctions: Secondary | ICD-10-CM | POA: Diagnosis not present

## 2017-07-01 DIAGNOSIS — G7000 Myasthenia gravis without (acute) exacerbation: Secondary | ICD-10-CM | POA: Diagnosis not present

## 2017-07-01 NOTE — Assessment & Plan Note (Signed)
Most recent LDL 77, most recent HDL 44, both excellent; continue  Zocor 10 mg by mouth daily

## 2017-07-01 NOTE — Assessment & Plan Note (Signed)
Stable; continue replacement in 1000 and units daily

## 2017-07-02 DIAGNOSIS — R488 Other symbolic dysfunctions: Secondary | ICD-10-CM | POA: Diagnosis not present

## 2017-07-02 DIAGNOSIS — G7000 Myasthenia gravis without (acute) exacerbation: Secondary | ICD-10-CM | POA: Diagnosis not present

## 2017-07-03 DIAGNOSIS — M79622 Pain in left upper arm: Secondary | ICD-10-CM | POA: Diagnosis not present

## 2017-07-05 DIAGNOSIS — R488 Other symbolic dysfunctions: Secondary | ICD-10-CM | POA: Diagnosis not present

## 2017-07-05 DIAGNOSIS — G7000 Myasthenia gravis without (acute) exacerbation: Secondary | ICD-10-CM | POA: Diagnosis not present

## 2017-07-14 ENCOUNTER — Non-Acute Institutional Stay (SKILLED_NURSING_FACILITY): Payer: Medicare Other | Admitting: Internal Medicine

## 2017-07-14 ENCOUNTER — Encounter: Payer: Self-pay | Admitting: Internal Medicine

## 2017-07-14 DIAGNOSIS — E785 Hyperlipidemia, unspecified: Secondary | ICD-10-CM

## 2017-07-14 DIAGNOSIS — F0280 Dementia in other diseases classified elsewhere without behavioral disturbance: Secondary | ICD-10-CM

## 2017-07-14 DIAGNOSIS — G301 Alzheimer's disease with late onset: Secondary | ICD-10-CM | POA: Diagnosis not present

## 2017-07-14 DIAGNOSIS — F331 Major depressive disorder, recurrent, moderate: Secondary | ICD-10-CM | POA: Diagnosis not present

## 2017-07-14 DIAGNOSIS — I4891 Unspecified atrial fibrillation: Secondary | ICD-10-CM

## 2017-07-14 DIAGNOSIS — F419 Anxiety disorder, unspecified: Secondary | ICD-10-CM | POA: Diagnosis not present

## 2017-07-14 DIAGNOSIS — F0390 Unspecified dementia without behavioral disturbance: Secondary | ICD-10-CM | POA: Diagnosis not present

## 2017-07-14 NOTE — Progress Notes (Signed)
Location:  [facility] Room Number: 405 Place of Service:  SNF [phone])  Margit Hanks, MD  Patient Care Team: Margit Hanks, MD as PCP - General (Internal Medicine) McKenzie, Mardene Celeste, MD as Consulting Physician (Urology)  Extended Emergency Contact Information Primary Emergency Contact: Ramus,David & Neoma [place] Home Phone: [phone] Mobile Phone: [phone] Relation: Son Secondary Emergency Contact: Richardean Chimera [place] Home Phone: [phone] Relation: None    Allergies: Ceftin [cefuroxime]  Chief Complaint  Patient presents with  . Medical Management of Chronic Issues    routine visit    HPI: Patient is 83 y.o. male who is being seen for routine issues of hyperlipidemia, dementia, and atrial fibrillation.  Past Medical History:  Diagnosis Date  . A-fib (HCC)   . AF (atrial fibrillation) (HCC) 03/31/2015  . Anorexia   . Aortic stenosis   . Arthritis   . Ataxia 05/29/2015  . Atrial fibrillation (HCC)   . Bacteremia due to Klebsiella pneumoniae 08/07/2015  . Candida UTI 10/01/2015  . Cerebral brain hemorrhage (HCC) 05/29/2015  . Chronic kidney disease   . CKD (chronic kidney disease), stage III (HCC) 03/31/2015  . Dementia    short term memory loss  . Depression 08/22/2016  . Encephalopathy acute   . Frequent falls 11/05/2015  . Gangrene (HCC)    perineal  . High cholesterol   . History of TIAs   . Hyperlipidemia   . Hypertension   . Major depressive disorder   . Multiple falls   . Myasthenia gravis (HCC)   . Neurogenic bladder 09/25/2015  . Sepsis due to Klebsiella pneumoniae (HCC) 08/07/2015  . Urinary incontinence   . Vertigo   . Vitamin D deficiency   . Weakness 05/29/2015    Past Surgical History:  Procedure Laterality Date  . BACK SURGERY  2006  . CERVICAL LAMINECTOMY  2002 ?  [name] HERNIA REPAIR     1970s    Allergies as of 07/14/2017      Reactions   Ceftin  [cefuroxime] Diarrhea   Severe diarrhea      Medication List        Accurate as of 07/14/17 11:59 PM. Always use your most recent med list.          acetaminophen 500 MG tablet Commonly known as:  TYLENOL Take 500 mg by mouth every 6 (six) hours as needed for mild pain.   cholecalciferol 1000 units tablet Commonly known as:  VITAMIN D Take 1,000 Units by mouth daily.   docusate sodium 100 MG capsule Commonly known as:  COLACE Take 100 mg by mouth 2 (two) times daily.   ELIQUIS 2.5 MG Tabs tablet Generic drug:  apixaban Take 2.5 mg by mouth 2 (two) times daily.   metoprolol succinate 50 MG 24 hr tablet Commonly known as:  TOPROL-XL Take 50 mg by mouth every morning. Take with or immediately following a meal.   MYRBETRIQ 25 MG Tb24 tablet Generic drug:  mirabegron ER Take 25 mg by mouth daily.   NUTRITIONAL SUPPLEMENT PO Offer Magic Cup as bedtime snack related to weight loss   ENSURE Take 237 mLs by mouth 3 (three) times daily between meals.   omeprazole 20 MG capsule Commonly known as:  PRILOSEC Take 20 mg by mouth daily.   predniSONE 10 MG tablet Commonly known as:  DELTASONE Take 1 tablet (10 mg total) by mouth daily with breakfast.   pyridostigmine 60  MG tablet Commonly known as:  MESTINON Take 60 mg by mouth 3 (three) times daily.   senna 8.6 MG tablet Commonly known as:  SENOKOT Take 1 tablet by mouth 2 (two) times daily.   simvastatin 10 MG tablet Commonly known as:  ZOCOR Take 10 mg by mouth daily.   TAB-A-VITE Tabs Take 1 tablet by mouth daily.       Meds ordered this encounter  Medications  . cholecalciferol (VITAMIN D) 1000 units tablet    Sig: Take 1,000 Units by mouth daily.    Immunization History  Administered Date(s) Administered  . Influenza,inj,Quad PF,6+ Mos 07/05/2015  . Influenza-Unspecified 07/17/2016, 07/30/2017  . PPD Test 11/09/2015  . Pneumococcal Polysaccharide-23 07/05/2015  . Tdap 03/31/2015    Social  History   Tobacco Use  . Smoking status: Former Smoker    Types: Cigars  . Smokeless tobacco: Never Used  Substance Use Topics  . Alcohol use: No    Alcohol/week: 0.0 oz    Review of Systems  DATA OBTAINED: from patient, nurse GENERAL:  no fevers, fatigue, appetite changes SKIN: No itching, rash HEENT: No complaint RESPIRATORY: No cough, wheezing, SOB CARDIAC: No chest pain, palpitations, lower extremity edema  GI: No abdominal pain, No N/V/D or constipation, No heartburn or reflux  GU: No dysuria, frequency or urgency, or incontinence  MUSCULOSKELETAL: No unrelieved bone/joint pain NEUROLOGIC: No headache, dizziness  PSYCHIATRIC: No overt anxiety or sadness  Vitals:   07/14/17 1313  BP: 98/72  Pulse: 62  Resp: 20  Temp: 98.5 F (36.9 C)   Body mass index is 25.51 kg/m. Physical Exam  GENERAL APPEARANCE: Alert, conversant, No acute distress  SKIN: No diaphoresis rash HEENT: Unremarkable RESPIRATORY: Breathing is even, unlabored. Lung sounds are clear   CARDIOVASCULAR: Heart RRR no murmurs, rubs or gallops. No peripheral edema  GASTROINTESTINAL: Abdomen is soft, non-tender, not distended w/ normal bowel sounds.  GENITOURINARY: Bladder non tender, not distended  MUSCULOSKELETAL: No abnormal joints or musculature NEUROLOGIC: Cranial nerves 2-12 grossly intact. Moves all extremities PSYCHIATRIC: Mood and affect appropriate to situationwith some dementia, no behavioral issues  Patient Active Problem List   Diagnosis Date Noted  . Hyperlipidemia 05/01/2017  . Abnormality on screening test 12/26/2016  . Current use of long term anticoagulation - Eliquis for A fib 12/16/2016  . Depression 08/22/2016  . Vitamin D deficiency 07/11/2016  . Encounter for family conference with patient present 12/14/2015  . Frequent falls 11/05/2015  . FTT (failure to thrive) in adult 09/25/2015  . Neurogenic bladder 09/25/2015  . DNR (do not resuscitate) 08/06/2015  .  Catheter-associated urinary tract infection (HCC) 08/03/2015  . Myasthenia gravis (HCC) 07/13/2015  . Dementia 07/04/2015  . Pressure ulcer 07/04/2015  . Dizziness and giddiness 05/29/2015  . Weakness 05/29/2015  . Proximal limb muscle weakness 05/29/2015  . Ataxia 05/29/2015  . CKD (chronic kidney disease), stage III (HCC) 03/31/2015  . HTN (hypertension) 03/31/2015  . AF (atrial fibrillation) (HCC) 03/31/2015  . Dyslipidemia 03/31/2015    CMP     Component Value Date/Time   NA 145 02/15/2017   K 4.3 02/15/2017   CL 113 (H) 11/09/2015 0710   CO2 24 11/09/2015 0710   GLUCOSE 106 (H) 11/09/2015 0710   BUN 16 02/15/2017   CREATININE 0.9 02/15/2017   CREATININE 0.76 11/09/2015 0710   CALCIUM 8.4 (L) 11/09/2015 0710   PROT 5.1 (L) 11/06/2015 0330   PROT 7.7 05/29/2015 1319   ALBUMIN 2.5 (L) 11/06/2015 0330  ALBUMIN 4.0 05/29/2015 1319   AST 23 02/15/2017   ALT 26 02/15/2017   ALKPHOS 76 02/15/2017   BILITOT 0.8 11/06/2015 0330   BILITOT 1.2 05/29/2015 1319   GFRNONAA >60 11/09/2015 0710   GFRAA >60 11/09/2015 0710   Recent Labs    02/15/17  NA 145  K 4.3  BUN 16  CREATININE 0.9   Recent Labs    02/15/17  AST 23  ALT 26  ALKPHOS 76   Recent Labs    02/15/17 05/06/17  WBC 9.2 11.9  HGB 14.3 14.5  HCT 42 42  PLT 384 484*   Recent Labs    02/15/17  CHOL 143  LDLCALC 77  TRIG 113   No results found for: MICROALBUR Lab Results  Component Value Date   TSH 0.86 02/15/2017   Lab Results  Component Value Date   HGBA1C 6.7 02/15/2017   Lab Results  Component Value Date   CHOL 143 02/15/2017   HDL 44 02/15/2017   LDLCALC 77 02/15/2017   TRIG 113 02/15/2017   CHOLHDL 9.3 01/20/2015    Significant Diagnostic Results in last 30 days:  No results found.  Assessment and Plan  Dyslipidemia Well controlled; continue Zocor 10 mg by mouth daily  Dementia chronic and stable without declines; continue supportive care  AF (atrial fibrillation)  (HCC) Chronic and stable;continue Eliquis 2.5 mge a day as prophylax and Toprol-XL 50 mg by mouth daily     Seraj Dunnam D. Lyn Hollingshead, MD

## 2017-07-20 DIAGNOSIS — R339 Retention of urine, unspecified: Secondary | ICD-10-CM | POA: Diagnosis not present

## 2017-08-13 ENCOUNTER — Encounter: Payer: Self-pay | Admitting: Internal Medicine

## 2017-08-13 ENCOUNTER — Non-Acute Institutional Stay (SKILLED_NURSING_FACILITY): Payer: Medicare Other | Admitting: Internal Medicine

## 2017-08-13 DIAGNOSIS — G7000 Myasthenia gravis without (acute) exacerbation: Secondary | ICD-10-CM

## 2017-08-13 DIAGNOSIS — I10 Essential (primary) hypertension: Secondary | ICD-10-CM

## 2017-08-13 DIAGNOSIS — N400 Enlarged prostate without lower urinary tract symptoms: Secondary | ICD-10-CM

## 2017-08-13 NOTE — Progress Notes (Signed)
[facility] Room Number: 405 Place of Service:  SNF [phone])  Margit HanksAlexander, Alfred Eckley D, MD  Patient Care Team: Margit HanksAlexander, Sloane Palmer D, MD as PCP - General (Internal Medicine) McKenzie, Mardene CelestePatrick L, MD as Consulting Physician (Urology)  Extended Emergency Contact Information Primary Emergency Contact: Eline,David & Neoma LamingEllen  [place] Home Phone: [phone] Mobile Phone: [phone] Relation: Son Secondary Emergency Contact: Richardean ChimeraSmoak,Mark  [place] Home Phone: [phone] Relation: None    Allergies: Ceftin [cefuroxime]  Chief Complaint  Patient presents with  . Medical Management of Chronic Issues    routine visit    HPI: Patient is 83 y.o. male who s being seen for routine issues of hypertension, myasthenia gravis, and BPH.  Past Medical History:  Diagnosis Date  . A-fib (HCC)   . AF (atrial fibrillation) (HCC) 03/31/2015  . Anorexia   . Aortic stenosis   . Arthritis   . Ataxia 05/29/2015  . Atrial fibrillation (HCC)   . Bacteremia due to Klebsiella pneumoniae 08/07/2015  . Candida UTI 10/01/2015  . Cerebral brain hemorrhage (HCC) 05/29/2015  . Chronic kidney disease   . CKD (chronic kidney disease), stage III (HCC) 03/31/2015  . Dementia    short term memory loss  . Depression 08/22/2016  . Encephalopathy acute   . Frequent falls 11/05/2015  . Gangrene (HCC)    perineal  . High cholesterol   . History of TIAs   . Hyperlipidemia   . Hypertension   . Major depressive disorder   . Multiple falls   . Myasthenia gravis (HCC)   . Neurogenic bladder 09/25/2015  . Sepsis due to Klebsiella pneumoniae (HCC) 08/07/2015  . Urinary incontinence   . Vertigo   . Vitamin D deficiency   . Weakness 05/29/2015    Past Surgical History:  Procedure Laterality Date  . BACK SURGERY  2006  . CERVICAL LAMINECTOMY  2002 ?  [name]. HERNIA REPAIR     1970s    Allergies as of 08/13/2017      Reactions   Ceftin [cefuroxime]  Diarrhea   Severe diarrhea      Medication List        Accurate as of 08/13/17 11:59 PM. Always use your most recent med list.          acetaminophen 500 MG tablet Commonly known as:  TYLENOL Take 500 mg by mouth every 6 (six) hours as needed for mild pain.   cholecalciferol 1000 units tablet Commonly known as:  VITAMIN D Take 1,000 Units by mouth daily.   docusate sodium 100 MG capsule Commonly known as:  COLACE Take 100 mg by mouth 2 (two) times daily.   ELIQUIS 2.5 MG Tabs tablet Generic drug:  apixaban Take 2.5 mg by mouth 2 (two) times daily.   metoprolol succinate 50 MG 24 hr tablet Commonly known as:  TOPROL-XL Take 50 mg by mouth every morning. Take with or immediately following a meal.   MYRBETRIQ 25 MG Tb24 tablet Generic drug:  mirabegron ER Take 25 mg by mouth daily.   NUTRITIONAL SUPPLEMENT PO Offer Magic Cup as bedtime snack related to weight loss   ENSURE Take 237 mLs by mouth 3 (three) times daily between meals.   omeprazole 20 MG capsule Commonly known as:  PRILOSEC Take 20 mg by mouth daily.   predniSONE 10 MG tablet Commonly known as:  DELTASONE Take 1 tablet (10 mg total) by mouth daily with breakfast.   pyridostigmine 60  MG tablet Commonly known as:  MESTINON Take 60 mg by mouth 3 (three) times daily.   senna 8.6 MG tablet Commonly known as:  SENOKOT Take 1 tablet by mouth 2 (two) times daily.   simvastatin 10 MG tablet Commonly known as:  ZOCOR Take 10 mg by mouth daily.   TAB-A-VITE Tabs Take 1 tablet by mouth daily.       No orders of the defined types were placed in this encounter.   Immunization History  Administered Date(s) Administered  . Influenza,inj,Quad PF,6+ Mos 07/05/2015  . Influenza-Unspecified 07/17/2016, 07/30/2017  . PPD Test 11/09/2015  . Pneumococcal Polysaccharide-23 07/05/2015  . Tdap 03/31/2015    Social History   Tobacco Use  . Smoking status: Former Smoker    Types: Cigars  . Smokeless  tobacco: Never Used  Substance Use Topics  . Alcohol use: No    Alcohol/week: 0.0 oz    Review of Systems  DATA OBTAINED: from patient, nurse GENERAL:  no fevers, fatigue, appetite changes SKIN: No itching, rash HEENT: No complaint RESPIRATORY: No cough, wheezing, SOB CARDIAC: No chest pain, palpitations, lower extremity edema  GI: No abdominal pain, No N/V/D or constipation, No heartburn or reflux  GU: No dysuria, frequency or urgency, or incontinence  MUSCULOSKELETAL: No unrelieved bone/joint pain NEUROLOGIC: No headache, dizziness  PSYCHIATRIC: No overt anxiety or sadness  Vitals:   08/13/17 1145  BP: 132/83  Pulse: (!) 54  Resp: 16  Temp: (!) 97.3 F (36.3 C)   Body mass index is 25.54 kg/m. Physical Exam  GENERAL APPEARANCE: Alert, conversant, No acute distress  SKIN: No diaphoresis rash HEENT: Unremarkable RESPIRATORY: Breathing is even, unlabored. Lung sounds are clear   CARDIOVASCULAR: Heart RRR no murmurs, rubs or gallops. trace peripheral edema  GASTROINTESTINAL: Abdomen is soft, non-tender, not distended w/ normal bowel sounds.  GENITOURINARY: Bladder non tender, not distended  MUSCULOSKELETAL: No abnormal joints or musculature NEUROLOGIC: Cranial nerves 2-12 grossly intact. Moves all extremities PSYCHIATRIC: Mood and affect appropriate to situation with very mild dementia, no behavioral issues  Patient Active Problem List   Diagnosis Date Noted  . BPH (benign prostatic hyperplasia) 08/29/2017  . Hyperlipidemia 05/01/2017  . Abnormality on screening test 12/26/2016  . Current use of long term anticoagulation - Eliquis for A fib 12/16/2016  . Depression 08/22/2016  . Vitamin D deficiency 07/11/2016  . Encounter for family conference with patient present 12/14/2015  . Frequent falls 11/05/2015  . FTT (failure to thrive) in adult 09/25/2015  . Neurogenic bladder 09/25/2015  . DNR (do not resuscitate) 08/06/2015  . Catheter-associated urinary tract  infection (HCC) 08/03/2015  . Myasthenia gravis (HCC) 07/13/2015  . Dementia 07/04/2015  . Pressure ulcer 07/04/2015  . Dizziness and giddiness 05/29/2015  . Weakness 05/29/2015  . Proximal limb muscle weakness 05/29/2015  . Ataxia 05/29/2015  . CKD (chronic kidney disease), stage III (HCC) 03/31/2015  . HTN (hypertension) 03/31/2015  . AF (atrial fibrillation) (HCC) 03/31/2015  . Dyslipidemia 03/31/2015    CMP     Component Value Date/Time   NA 145 02/15/2017   K 4.3 02/15/2017   CL 113 (H) 11/09/2015 0710   CO2 24 11/09/2015 0710   GLUCOSE 106 (H) 11/09/2015 0710   BUN 16 02/15/2017   CREATININE 0.9 02/15/2017   CREATININE 0.76 11/09/2015 0710   CALCIUM 8.4 (L) 11/09/2015 0710   PROT 5.1 (L) 11/06/2015 0330   PROT 7.7 05/29/2015 1319   ALBUMIN 2.5 (L) 11/06/2015 0330   ALBUMIN 4.0 05/29/2015  1319   AST 23 02/15/2017   ALT 26 02/15/2017   ALKPHOS 76 02/15/2017   BILITOT 0.8 11/06/2015 0330   BILITOT 1.2 05/29/2015 1319   GFRNONAA >60 11/09/2015 0710   GFRAA >60 11/09/2015 0710   Recent Labs    02/15/17  NA 145  K 4.3  BUN 16  CREATININE 0.9   Recent Labs    02/15/17  AST 23  ALT 26  ALKPHOS 76   Recent Labs    02/15/17 05/06/17  WBC 9.2 11.9  HGB 14.3 14.5  HCT 42 42  PLT 384 484*   Recent Labs    02/15/17  CHOL 143  LDLCALC 77  TRIG 113   No results found for: MICROALBUR Lab Results  Component Value Date   TSH 0.86 02/15/2017   Lab Results  Component Value Date   HGBA1C 6.7 02/15/2017   Lab Results  Component Value Date   CHOL 143 02/15/2017   HDL 44 02/15/2017   LDLCALC 77 02/15/2017   TRIG 113 02/15/2017   CHOLHDL 9.3 01/20/2015    Significant Diagnostic Results in last 30 days:  No results found.  Assessment and Plan  HTN (hypertension) Controlled on Toprol-XL 50 mg by mouth daily; continue current regimen  Myasthenia gravis (HCC) Chronic and stable;continue Mestinon 30 mg by mouth 3 times a day  BPH (benign prostatic  hyperplasia) Without symptoms as long as patient continues Myrbetriq 25 mg by mouth daily     Fantasy Donald D. Lyn Hollingshead, MD

## 2017-08-17 DIAGNOSIS — R338 Other retention of urine: Secondary | ICD-10-CM | POA: Diagnosis not present

## 2017-08-17 DIAGNOSIS — M6281 Muscle weakness (generalized): Secondary | ICD-10-CM | POA: Diagnosis not present

## 2017-08-17 DIAGNOSIS — G7000 Myasthenia gravis without (acute) exacerbation: Secondary | ICD-10-CM | POA: Diagnosis not present

## 2017-08-19 DIAGNOSIS — G7000 Myasthenia gravis without (acute) exacerbation: Secondary | ICD-10-CM | POA: Diagnosis not present

## 2017-08-19 DIAGNOSIS — M6281 Muscle weakness (generalized): Secondary | ICD-10-CM | POA: Diagnosis not present

## 2017-08-23 DIAGNOSIS — M6281 Muscle weakness (generalized): Secondary | ICD-10-CM | POA: Diagnosis not present

## 2017-08-23 DIAGNOSIS — G7000 Myasthenia gravis without (acute) exacerbation: Secondary | ICD-10-CM | POA: Diagnosis not present

## 2017-08-24 DIAGNOSIS — I739 Peripheral vascular disease, unspecified: Secondary | ICD-10-CM | POA: Diagnosis not present

## 2017-08-24 DIAGNOSIS — B351 Tinea unguium: Secondary | ICD-10-CM | POA: Diagnosis not present

## 2017-08-25 DIAGNOSIS — M6281 Muscle weakness (generalized): Secondary | ICD-10-CM | POA: Diagnosis not present

## 2017-08-25 DIAGNOSIS — G7000 Myasthenia gravis without (acute) exacerbation: Secondary | ICD-10-CM | POA: Diagnosis not present

## 2017-08-26 DIAGNOSIS — F331 Major depressive disorder, recurrent, moderate: Secondary | ICD-10-CM | POA: Diagnosis not present

## 2017-08-26 DIAGNOSIS — F419 Anxiety disorder, unspecified: Secondary | ICD-10-CM | POA: Diagnosis not present

## 2017-08-26 DIAGNOSIS — F0390 Unspecified dementia without behavioral disturbance: Secondary | ICD-10-CM | POA: Diagnosis not present

## 2017-08-27 DIAGNOSIS — G7000 Myasthenia gravis without (acute) exacerbation: Secondary | ICD-10-CM | POA: Diagnosis not present

## 2017-08-27 DIAGNOSIS — M6281 Muscle weakness (generalized): Secondary | ICD-10-CM | POA: Diagnosis not present

## 2017-08-29 ENCOUNTER — Encounter: Payer: Self-pay | Admitting: Internal Medicine

## 2017-08-29 DIAGNOSIS — N400 Enlarged prostate without lower urinary tract symptoms: Secondary | ICD-10-CM | POA: Insufficient documentation

## 2017-08-29 NOTE — Assessment & Plan Note (Signed)
Well controlled; continue Zocor 10 mg by mouth daily

## 2017-08-29 NOTE — Assessment & Plan Note (Signed)
Controlled on Toprol-XL 50 mg by mouth daily; continue current regimen

## 2017-08-29 NOTE — Assessment & Plan Note (Signed)
Chronic and stable;continue Mestinon 30 mg by mouth 3 times a day

## 2017-08-29 NOTE — Assessment & Plan Note (Signed)
chronic and stable without declines; continue supportive care

## 2017-08-29 NOTE — Assessment & Plan Note (Signed)
Without symptoms as long as patient continues Myrbetriq 25 mg by mouth daily

## 2017-08-29 NOTE — Assessment & Plan Note (Signed)
Chronic and stable;continue Eliquis 2.5 mge a day as prophylax and Toprol-XL 50 mg by mouth daily

## 2017-08-30 DIAGNOSIS — M6281 Muscle weakness (generalized): Secondary | ICD-10-CM | POA: Diagnosis not present

## 2017-08-30 DIAGNOSIS — G7000 Myasthenia gravis without (acute) exacerbation: Secondary | ICD-10-CM | POA: Diagnosis not present

## 2017-09-01 DIAGNOSIS — G7000 Myasthenia gravis without (acute) exacerbation: Secondary | ICD-10-CM | POA: Diagnosis not present

## 2017-09-01 DIAGNOSIS — M6281 Muscle weakness (generalized): Secondary | ICD-10-CM | POA: Diagnosis not present

## 2017-09-03 DIAGNOSIS — G7000 Myasthenia gravis without (acute) exacerbation: Secondary | ICD-10-CM | POA: Diagnosis not present

## 2017-09-03 DIAGNOSIS — M6281 Muscle weakness (generalized): Secondary | ICD-10-CM | POA: Diagnosis not present

## 2017-09-06 DIAGNOSIS — M6281 Muscle weakness (generalized): Secondary | ICD-10-CM | POA: Diagnosis not present

## 2017-09-06 DIAGNOSIS — G7000 Myasthenia gravis without (acute) exacerbation: Secondary | ICD-10-CM | POA: Diagnosis not present

## 2017-09-14 DIAGNOSIS — R338 Other retention of urine: Secondary | ICD-10-CM | POA: Diagnosis not present

## 2017-09-15 ENCOUNTER — Encounter: Payer: Self-pay | Admitting: Internal Medicine

## 2017-09-15 ENCOUNTER — Non-Acute Institutional Stay (SKILLED_NURSING_FACILITY): Payer: Medicare Other | Admitting: Internal Medicine

## 2017-09-15 DIAGNOSIS — Z7901 Long term (current) use of anticoagulants: Secondary | ICD-10-CM

## 2017-09-15 DIAGNOSIS — E559 Vitamin D deficiency, unspecified: Secondary | ICD-10-CM

## 2017-09-15 DIAGNOSIS — N183 Chronic kidney disease, stage 3 unspecified: Secondary | ICD-10-CM

## 2017-09-15 NOTE — Progress Notes (Signed)
[facility] Room Number: 405 Place of Service:  SNF ([phone])  Margit HanksAlexander, Tamzin Bertling D, MD  Patient Care Team: Margit HanksAlexander, Marthe Dant D, MD as PCP - General (Internal Medicine) McKenzie, Mardene CelestePatrick L, MD as Consulting Physician (Urology)  Extended Emergency Contact Information Primary Emergency Contact: Plumb,David & Neoma LamingEllen  [place] Home Phone: [phone] Mobile Phone: [phone] Relation: Son Secondary Emergency Contact: Richardean ChimeraSmoak,Mark  [place] Home Phone: [phone] Relation: None    Allergies: Ceftin [cefuroxime]  Chief Complaint  Patient presents with  . Medical Management of Chronic Issues    routine visit    HPI: Patient is 83 y.o. male who is being seen for routine issues of chronic kidney disease stage III, chronic Coumadin therapy, and vitamin D deficiency.  Past Medical History:  Diagnosis Date  . A-fib (HCC)   . AF (atrial fibrillation) (HCC) 03/31/2015  . Anorexia   . Aortic stenosis   . Arthritis   . Ataxia 05/29/2015  . Atrial fibrillation (HCC)   . Bacteremia due to Klebsiella pneumoniae 08/07/2015  . Candida UTI 10/01/2015  . Cerebral brain hemorrhage (HCC) 05/29/2015  . Chronic kidney disease   . CKD (chronic kidney disease), stage III (HCC) 03/31/2015  . Dementia    short term memory loss  . Depression 08/22/2016  . Encephalopathy acute   . Frequent falls 11/05/2015  . Gangrene (HCC)    perineal  . High cholesterol   . History of TIAs   . Hyperlipidemia   . Hypertension   . Major depressive disorder   . Multiple falls   . Myasthenia gravis (HCC)   . Neurogenic bladder 09/25/2015  . Sepsis due to Klebsiella pneumoniae (HCC) 08/07/2015  . Urinary incontinence   . Vertigo   . Vitamin D deficiency   . Weakness 05/29/2015    Past Surgical History:  Procedure Laterality Date  . BACK SURGERY  2006  . CERVICAL LAMINECTOMY  2002 ?  [name]. CYSTOSCOPY N/A 06/26/2015   Procedure: CYSTOSCOPY;   Surgeon: Malen GauzePatrick L McKenzie, MD;  Location: [facility];  Service: Urology;  Laterality: N/A;  . HERNIA REPAIR     1970s  . INSERTION OF SUPRAPUBIC CATHETER N/A 06/26/2015   Procedure: INSERTION OF SUPRAPUBIC CATHETER;  Surgeon: Malen GauzePatrick L McKenzie, MD;  Location: [facility];  Service: Urology;  Laterality: N/A;    Allergies as of 09/15/2017      Reactions   Ceftin [cefuroxime] Diarrhea   Severe diarrhea      Medication List        Accurate as of 09/15/17 11:59 PM. Always use your most recent med list.          acetaminophen 500 MG tablet Commonly known as:  TYLENOL Take 500 mg by mouth every 6 (six) hours as needed for mild pain.   cholecalciferol 1000 units tablet Commonly known as:  VITAMIN D Take 1,000 Units by mouth daily.   docusate sodium 100 MG capsule Commonly known as:  COLACE Take 100 mg by mouth 2 (two) times daily.   ELIQUIS 2.5 MG Tabs tablet Generic drug:  apixaban Take 2.5 mg by mouth 2 (two) times daily.   eucerin cream Apply topically. Apply to bilateral legs daily for dry skin   metoprolol succinate 50 MG 24 hr tablet Commonly known as:  TOPROL-XL Take 50 mg by mouth every morning. Take with or immediately following a meal.   MYRBETRIQ 25 MG Tb24 tablet Generic drug:  mirabegron ER Take  25 mg by mouth daily.   NUTRITIONAL SUPPLEMENT PO Offer Magic Cup as bedtime snack related to weight loss   ENSURE Take 237 mLs by mouth. Take 237 ml daily between meals   predniSONE 10 MG tablet Commonly known as:  DELTASONE Take 1 tablet (10 mg total) by mouth daily with breakfast.   pyridostigmine 60 MG tablet Commonly known as:  MESTINON Take 60 mg by mouth 3 (three) times daily.   senna 8.6 MG tablet Commonly known as:  SENOKOT Take 1 tablet by mouth 2 (two) times daily.   simvastatin 10 MG tablet Commonly known as:  ZOCOR Take 10 mg by mouth daily.   TAB-A-VITE Tabs Take 1 tablet by mouth daily.       No orders of the defined types were placed in  this encounter.   Immunization History  Administered Date(s) Administered  . Influenza,inj,Quad PF,6+ Mos 07/05/2015  . Influenza-Unspecified 07/17/2016, 07/30/2017  . PPD Test 11/09/2015  . Pneumococcal Polysaccharide-23 07/05/2015  . Tdap 03/31/2015    Social History   Tobacco Use  . Smoking status: Former Smoker    Types: Cigars  . Smokeless tobacco: Never Used  Substance Use Topics  . Alcohol use: No    Alcohol/week: 0.0 oz    Review of Systems  DATA OBTAINED: from patient GENERAL:  no fevers, fatigue, appetite changes SKIN: No itching, rash HEENT: No complaint RESPIRATORY: No cough, wheezing, SOB CARDIAC: No chest pain, palpitations, lower extremity edema  GI: No abdominal pain, No N/V/D or constipation, No heartburn or reflux  GU: No dysuria, frequency or urgency, or incontinence  MUSCULOSKELETAL: No unrelieved bone/joint pain NEUROLOGIC: No headache, dizziness  PSYCHIATRIC: No overt anxiety or sadness  Vitals:   09/15/17 1213  BP: 122/63  Pulse: 64  Resp: 18  Temp: 98.1 F (36.7 C)  SpO2: 96%   Body mass index is 24.68 kg/m. Physical Exam  GENERAL APPEARANCE: Alert, conversant, No acute distress  SKIN: No diaphoresis rash HEENT: Unremarkable RESPIRATORY: Breathing is even, unlabored. Lung sounds are clear   CARDIOVASCULAR: Heart RRR no murmurs, rubs or gallops. No peripheral edema  GASTROINTESTINAL: Abdomen is soft, non-tender, not distended w/ normal bowel sounds.  GENITOURINARY: Bladder non tender, not distended  MUSCULOSKELETAL: No abnormal joints or musculature NEUROLOGIC: Cranial nerves 2-12 grossly intact. Moves all extremities PSYCHIATRIC: Mood and affect appropriate to situation, no behavioral issues  Patient Active Problem List   Diagnosis Date Noted  . BPH (benign prostatic hyperplasia) 08/29/2017  . Hyperlipidemia 05/01/2017  . Abnormality on screening test 12/26/2016  . Current use of long term anticoagulation - Eliquis for A fib  12/16/2016  . Depression 08/22/2016  . Vitamin D deficiency 07/11/2016  . Encounter for family conference with patient present 12/14/2015  . Frequent falls 11/05/2015  . FTT (failure to thrive) in adult 09/25/2015  . Neurogenic bladder 09/25/2015  . DNR (do not resuscitate) 08/06/2015  . Catheter-associated urinary tract infection (HCC) 08/03/2015  . Myasthenia gravis (HCC) 07/13/2015  . Dementia 07/04/2015  . Pressure ulcer 07/04/2015  . Dizziness and giddiness 05/29/2015  . Weakness 05/29/2015  . Proximal limb muscle weakness 05/29/2015  . Ataxia 05/29/2015  . CKD (chronic kidney disease), stage III (HCC) 03/31/2015  . HTN (hypertension) 03/31/2015  . AF (atrial fibrillation) (HCC) 03/31/2015  . Dyslipidemia 03/31/2015    CMP     Component Value Date/Time   NA 145 02/15/2017   K 4.3 02/15/2017   CL 113 (H) 11/09/2015 0710   CO2 24 11/09/2015  0710   GLUCOSE 106 (H) 11/09/2015 0710   BUN 16 02/15/2017   CREATININE 0.9 02/15/2017   CREATININE 0.76 11/09/2015 0710   CALCIUM 8.4 (L) 11/09/2015 0710   PROT 5.1 (L) 11/06/2015 0330   PROT 7.7 05/29/2015 1319   ALBUMIN 2.5 (L) 11/06/2015 0330   ALBUMIN 4.0 05/29/2015 1319   AST 23 02/15/2017   ALT 26 02/15/2017   ALKPHOS 76 02/15/2017   BILITOT 0.8 11/06/2015 0330   BILITOT 1.2 05/29/2015 1319   GFRNONAA >60 11/09/2015 0710   GFRAA >60 11/09/2015 0710   Recent Labs    02/15/17  NA 145  K 4.3  BUN 16  CREATININE 0.9   Recent Labs    02/15/17  AST 23  ALT 26  ALKPHOS 76   Recent Labs    02/15/17 05/06/17  WBC 9.2 11.9  HGB 14.3 14.5  HCT 42 42  PLT 384 484*   Recent Labs    02/15/17  CHOL 143  LDLCALC 77  TRIG 113   No results found for: MICROALBUR Lab Results  Component Value Date   TSH 0.86 02/15/2017   Lab Results  Component Value Date   HGBA1C 6.7 02/15/2017   Lab Results  Component Value Date   CHOL 143 02/15/2017   HDL 44 02/15/2017   LDLCALC 77 02/15/2017   TRIG 113 02/15/2017    CHOLHDL 9.3 01/20/2015    Significant Diagnostic Results in last 30 days:  No results found.  Assessment and Plan  CKD (chronic kidney disease), stage III BUN/creatinine 16/0.9, which is excellent for patient's age; is due for new labs have ordered; will continue to monitor  Current use of long term anticoagulation - Eliquis for A fib On Coumadin for atrial fibrillation; is actively titrated  Vitamin D deficiency Stable; new level has been ordered; will continue a thousand units by mouth daily    Thurston Holenne D. Lyn HollingsheadAlexander, MD

## 2017-09-17 ENCOUNTER — Encounter: Payer: Self-pay | Admitting: Internal Medicine

## 2017-09-17 NOTE — Assessment & Plan Note (Signed)
BUN/creatinine 16/0.9, which is excellent for patient's age; is due for new labs have ordered; will continue to monitor

## 2017-09-17 NOTE — Assessment & Plan Note (Signed)
Stable; new level has been ordered; will continue a thousand units by mouth daily

## 2017-09-17 NOTE — Assessment & Plan Note (Signed)
On Coumadin for atrial fibrillation; is actively titrated

## 2017-09-21 DIAGNOSIS — E039 Hypothyroidism, unspecified: Secondary | ICD-10-CM | POA: Diagnosis not present

## 2017-09-21 DIAGNOSIS — D649 Anemia, unspecified: Secondary | ICD-10-CM | POA: Diagnosis not present

## 2017-09-21 DIAGNOSIS — E119 Type 2 diabetes mellitus without complications: Secondary | ICD-10-CM | POA: Diagnosis not present

## 2017-09-21 DIAGNOSIS — I10 Essential (primary) hypertension: Secondary | ICD-10-CM | POA: Diagnosis not present

## 2017-09-21 DIAGNOSIS — E559 Vitamin D deficiency, unspecified: Secondary | ICD-10-CM | POA: Diagnosis not present

## 2017-09-21 DIAGNOSIS — E0810 Diabetes mellitus due to underlying condition with ketoacidosis without coma: Secondary | ICD-10-CM | POA: Diagnosis not present

## 2017-09-21 DIAGNOSIS — E785 Hyperlipidemia, unspecified: Secondary | ICD-10-CM | POA: Diagnosis not present

## 2017-09-21 LAB — VITAMIN D 25 HYDROXY (VIT D DEFICIENCY, FRACTURES): VIT D 25 HYDROXY: 54.04

## 2017-09-21 LAB — LIPID PANEL
Cholesterol: 145 (ref 0–200)
HDL: 44 (ref 35–70)
LDL Cholesterol: 72
Triglycerides: 146 (ref 40–160)

## 2017-09-21 LAB — CBC AND DIFFERENTIAL
HEMATOCRIT: 41 (ref 41–53)
HEMOGLOBIN: 13.8 (ref 13.5–17.5)
Platelets: 435 — AB (ref 150–399)
WBC: 9.3

## 2017-09-21 LAB — BASIC METABOLIC PANEL
BUN: 11 (ref 4–21)
CREATININE: 0.8 (ref 0.6–1.3)
GLUCOSE: 129
POTASSIUM: 4.4 (ref 3.4–5.3)
Sodium: 141 (ref 137–147)

## 2017-09-21 LAB — TSH: TSH: 6.3 — AB (ref 0.41–5.90)

## 2017-09-21 LAB — HEMOGLOBIN A1C: Hemoglobin A1C: 6.3

## 2017-09-21 LAB — HEPATIC FUNCTION PANEL
ALT: 16 (ref 10–40)
AST: 14 (ref 14–40)
Alkaline Phosphatase: 96 (ref 25–125)
Bilirubin, Total: 0.7

## 2017-09-22 ENCOUNTER — Other Ambulatory Visit: Payer: Self-pay

## 2017-10-01 DIAGNOSIS — F0390 Unspecified dementia without behavioral disturbance: Secondary | ICD-10-CM | POA: Diagnosis not present

## 2017-10-01 DIAGNOSIS — F329 Major depressive disorder, single episode, unspecified: Secondary | ICD-10-CM | POA: Diagnosis not present

## 2017-10-01 DIAGNOSIS — F331 Major depressive disorder, recurrent, moderate: Secondary | ICD-10-CM | POA: Diagnosis not present

## 2017-10-07 ENCOUNTER — Non-Acute Institutional Stay (SKILLED_NURSING_FACILITY): Payer: Medicare Other | Admitting: Internal Medicine

## 2017-10-07 ENCOUNTER — Encounter: Payer: Self-pay | Admitting: Internal Medicine

## 2017-10-07 DIAGNOSIS — E559 Vitamin D deficiency, unspecified: Secondary | ICD-10-CM | POA: Diagnosis not present

## 2017-10-07 DIAGNOSIS — N319 Neuromuscular dysfunction of bladder, unspecified: Secondary | ICD-10-CM

## 2017-10-07 DIAGNOSIS — E785 Hyperlipidemia, unspecified: Secondary | ICD-10-CM

## 2017-10-07 NOTE — Progress Notes (Signed)
[facility] Room Number: 405 Place of Service:  SNF ([phone])  Margit HanksAlexander, Kristine Tiley D, MD  Patient Care Team: Margit HanksAlexander, Vidya Bamford D, MD as PCP - General (Internal Medicine) McKenzie, Mardene CelestePatrick L, MD as Consulting Physician (Urology)  Extended Emergency Contact Information Primary Emergency Contact: Catena,David & Neoma LamingEllen  [place] Home Phone: [phone] Mobile Phone: [phone] Relation: Son Secondary Emergency Contact: Richardean ChimeraSmoak,Mark  [place] Home Phone: [phone] Relation: None    Allergies: Ceftin [cefuroxime]  Chief Complaint  Patient presents with  . Medical Management of Chronic Issues    routine visit    HPI: Patient is 83 y.o. male who is being seen for routine issues of vitamin D deficiency, neurogenic bladder, and hyperlipidemia.  Past Medical History:  Diagnosis Date  . A-fib (HCC)   . AF (atrial fibrillation) (HCC) 03/31/2015  . Anorexia   . Aortic stenosis   . Arthritis   . Ataxia 05/29/2015  . Atrial fibrillation (HCC)   . Bacteremia due to Klebsiella pneumoniae 08/07/2015  . Candida UTI 10/01/2015  . Cerebral brain hemorrhage (HCC) 05/29/2015  . Chronic kidney disease   . CKD (chronic kidney disease), stage III (HCC) 03/31/2015  . Dementia    short term memory loss  . Depression 08/22/2016  . Encephalopathy acute   . Frequent falls 11/05/2015  . Gangrene (HCC)    perineal  . High cholesterol   . History of TIAs   . Hyperlipidemia   . Hypertension   . Major depressive disorder   . Multiple falls   . Myasthenia gravis (HCC)   . Neurogenic bladder 09/25/2015  . Sepsis due to Klebsiella pneumoniae (HCC) 08/07/2015  . Urinary incontinence   . Vertigo   . Vitamin D deficiency   . Weakness 05/29/2015    Past Surgical History:  Procedure Laterality Date  . BACK SURGERY  2006  . CERVICAL LAMINECTOMY  2002 ?  [name]. CYSTOSCOPY N/A 06/26/2015   Procedure: CYSTOSCOPY;  Surgeon: Malen GauzePatrick L McKenzie,  MD;  Location: [facility];  Service: Urology;  Laterality: N/A;  . HERNIA REPAIR     1970s  . INSERTION OF SUPRAPUBIC CATHETER N/A 06/26/2015   Procedure: INSERTION OF SUPRAPUBIC CATHETER;  Surgeon: Malen GauzePatrick L McKenzie, MD;  Location: [facility];  Service: Urology;  Laterality: N/A;    Allergies as of 10/07/2017      Reactions   Ceftin [cefuroxime] Diarrhea   Severe diarrhea      Medication List        Accurate as of 10/07/17 11:59 PM. Always use your most recent med list.          acetaminophen 500 MG tablet Commonly known as:  TYLENOL Take 500 mg by mouth every 6 (six) hours as needed for mild pain.   cholecalciferol 1000 units tablet Commonly known as:  VITAMIN D Take 1,000 Units by mouth daily.   docusate sodium 100 MG capsule Commonly known as:  COLACE Take 100 mg by mouth 2 (two) times daily.   ELIQUIS 2.5 MG Tabs tablet Generic drug:  apixaban Take 2.5 mg by mouth 2 (two) times daily.   eucerin cream Apply topically. Apply to bilateral legs daily for dry skin   metoprolol succinate 50 MG 24 hr tablet Commonly known as:  TOPROL-XL Take 50 mg by mouth every morning. Take with or immediately following a meal.   MYRBETRIQ 25 MG Tb24 tablet Generic drug:  mirabegron ER Take 25 mg by mouth daily.  NUTRITIONAL SUPPLEMENT PO Offer Magic Cup as bedtime snack related to weight loss   ENSURE Take 237 mLs by mouth. Take 237 ml daily between meals   predniSONE 10 MG tablet Commonly known as:  DELTASONE Take 1 tablet (10 mg total) by mouth daily with breakfast.   pyridostigmine 60 MG tablet Commonly known as:  MESTINON Take 60 mg by mouth 3 (three) times daily.   senna 8.6 MG tablet Commonly known as:  SENOKOT Take 1 tablet by mouth 2 (two) times daily.   simvastatin 10 MG tablet Commonly known as:  ZOCOR Take 10 mg by mouth daily.   TAB-A-VITE Tabs Take 1 tablet by mouth daily.       No orders of the defined types were placed in this  encounter.   Immunization History  Administered Date(s) Administered  . Influenza,inj,Quad PF,6+ Mos 07/05/2015  . Influenza-Unspecified 07/17/2016, 07/30/2017  . PPD Test 11/09/2015  . Pneumococcal Polysaccharide-23 07/05/2015  . Tdap 03/31/2015    Social History   Tobacco Use  . Smoking status: Former Smoker    Types: Cigars  . Smokeless tobacco: Never Used  Substance Use Topics  . Alcohol use: No    Alcohol/week: 0.0 oz    Review of Systems  DATA OBTAINED: from patient, nurse GENERAL:  no fevers, fatigue, appetite changes SKIN: No itching, rash HEENT: No complaint RESPIRATORY: No cough, wheezing, SOB CARDIAC: No chest pain, palpitations, lower extremity edema  GI: No abdominal pain, No N/V/D or constipation, No heartburn or reflux  GU: No dysuria, frequency or urgency, or incontinence  MUSCULOSKELETAL: No unrelieved bone/joint pain NEUROLOGIC: No headache, dizziness  PSYCHIATRIC: No overt anxiety or sadness  Vitals:   10/07/17 1538  BP: 123/65  Pulse: 60  Resp: 18  Temp: (!) 97.4 F (36.3 C)  SpO2: 97%   Body mass index is 25.54 kg/m. Physical Exam  GENERAL APPEARANCE: Alert, conversant, No acute distress  SKIN: No diaphoresis rash HEENT: Unremarkable RESPIRATORY: Breathing is even, unlabored. Lung sounds are clear   CARDIOVASCULAR: Heart RRR no murmurs, rubs or gallops. No peripheral edema  GASTROINTESTINAL: Abdomen is soft, non-tender, not distended w/ normal bowel sounds.  GENITOURINARY: Bladder non tender, not distended  MUSCULOSKELETAL: No abnormal joints or musculature NEUROLOGIC: Cranial nerves 2-12 grossly intact. Moves all extremities PSYCHIATRIC: Mood and affect appropriate to situation with mild dementia, no behavioral issues  Patient Active Problem List   Diagnosis Date Noted  . BPH (benign prostatic hyperplasia) 08/29/2017  . Hyperlipidemia 05/01/2017  . Abnormality on screening test 12/26/2016  . Current use of long term  anticoagulation - Eliquis for A fib 12/16/2016  . Depression 08/22/2016  . Vitamin D deficiency 07/11/2016  . Encounter for family conference with patient present 12/14/2015  . Frequent falls 11/05/2015  . FTT (failure to thrive) in adult 09/25/2015  . Neurogenic bladder 09/25/2015  . DNR (do not resuscitate) 08/06/2015  . Catheter-associated urinary tract infection (HCC) 08/03/2015  . Myasthenia gravis (HCC) 07/13/2015  . Dementia 07/04/2015  . Pressure ulcer 07/04/2015  . Dizziness and giddiness 05/29/2015  . Weakness 05/29/2015  . Proximal limb muscle weakness 05/29/2015  . Ataxia 05/29/2015  . CKD (chronic kidney disease), stage III (HCC) 03/31/2015  . HTN (hypertension) 03/31/2015  . AF (atrial fibrillation) (HCC) 03/31/2015  . Dyslipidemia 03/31/2015    CMP     Component Value Date/Time   NA 141 09/21/2017   K 4.4 09/21/2017   CL 113 (H) 11/09/2015 0710   CO2 24 11/09/2015 0710  GLUCOSE 106 (H) 11/09/2015 0710   BUN 11 09/21/2017   CREATININE 0.8 09/21/2017   CREATININE 0.76 11/09/2015 0710   CALCIUM 8.4 (L) 11/09/2015 0710   PROT 5.1 (L) 11/06/2015 0330   PROT 7.7 05/29/2015 1319   ALBUMIN 2.5 (L) 11/06/2015 0330   ALBUMIN 4.0 05/29/2015 1319   AST 14 09/21/2017   ALT 16 09/21/2017   ALKPHOS 96 09/21/2017   BILITOT 0.8 11/06/2015 0330   BILITOT 1.2 05/29/2015 1319   GFRNONAA >60 11/09/2015 0710   GFRAA >60 11/09/2015 0710   Recent Labs    02/15/17 09/21/17  NA 145 141  K 4.3 4.4  BUN 16 11  CREATININE 0.9 0.8   Recent Labs    02/15/17 09/21/17  AST 23 14  ALT 26 16  ALKPHOS 76 96   Recent Labs    02/15/17 05/06/17 09/21/17  WBC 9.2 11.9 9.3  HGB 14.3 14.5 13.8  HCT 42 42 41  PLT 384 484* 435*   Recent Labs    02/15/17 09/21/17  CHOL 143 145  LDLCALC 77 72  TRIG 113 146   No results found for: MICROALBUR Lab Results  Component Value Date   TSH 6.30 (A) 09/21/2017   Lab Results  Component Value Date   HGBA1C 6.3 09/21/2017   Lab  Results  Component Value Date   CHOL 145 09/21/2017   HDL 44 09/21/2017   LDLCALC 72 09/21/2017   TRIG 146 09/21/2017   CHOLHDL 9.3 01/20/2015    Significant Diagnostic Results in last 30 days:  No results found.  Assessment and Plan  Vitamin D deficiency Vitamin D levels 54; can continue thousand units daily  Neurogenic bladder No recent infections; patient with suprapubic catheter; plan to continue Myrbetriq ER 24 mg by mouth daily  Hyperlipidemia LDL 72, HDL 44, glucose control; plan to continue Zocor 10 mg by mouth daily    Kinslie Hove D. Lyn HollingsheadAlexander, MD

## 2017-10-22 DIAGNOSIS — R338 Other retention of urine: Secondary | ICD-10-CM | POA: Diagnosis not present

## 2017-11-10 ENCOUNTER — Encounter: Payer: Self-pay | Admitting: Internal Medicine

## 2017-11-10 NOTE — Assessment & Plan Note (Signed)
LDL 72, HDL 44, glucose control; plan to continue Zocor 10 mg by mouth daily

## 2017-11-10 NOTE — Assessment & Plan Note (Signed)
No recent infections; patient with suprapubic catheter; plan to continue Myrbetriq ER 24 mg by mouth daily

## 2017-11-10 NOTE — Assessment & Plan Note (Signed)
Vitamin D levels 54; can continue thousand units daily

## 2017-11-11 DIAGNOSIS — F0390 Unspecified dementia without behavioral disturbance: Secondary | ICD-10-CM | POA: Diagnosis not present

## 2017-11-11 DIAGNOSIS — F331 Major depressive disorder, recurrent, moderate: Secondary | ICD-10-CM | POA: Diagnosis not present

## 2017-11-11 DIAGNOSIS — F419 Anxiety disorder, unspecified: Secondary | ICD-10-CM | POA: Diagnosis not present

## 2017-11-15 ENCOUNTER — Encounter: Payer: Self-pay | Admitting: Internal Medicine

## 2017-11-15 ENCOUNTER — Non-Acute Institutional Stay (SKILLED_NURSING_FACILITY): Payer: Medicare Other | Admitting: Internal Medicine

## 2017-11-15 DIAGNOSIS — R338 Other retention of urine: Secondary | ICD-10-CM | POA: Diagnosis not present

## 2017-11-15 DIAGNOSIS — I10 Essential (primary) hypertension: Secondary | ICD-10-CM | POA: Diagnosis not present

## 2017-11-15 DIAGNOSIS — R3915 Urgency of urination: Secondary | ICD-10-CM | POA: Diagnosis not present

## 2017-11-15 DIAGNOSIS — G301 Alzheimer's disease with late onset: Secondary | ICD-10-CM | POA: Diagnosis not present

## 2017-11-15 DIAGNOSIS — I4891 Unspecified atrial fibrillation: Secondary | ICD-10-CM | POA: Diagnosis not present

## 2017-11-15 DIAGNOSIS — F0280 Dementia in other diseases classified elsewhere without behavioral disturbance: Secondary | ICD-10-CM

## 2017-11-15 NOTE — Progress Notes (Signed)
Location:  [facility] Room Number: 405P Place of Service:  SNF (31)  Margit Hanks, MD  Patient Care Team: Margit Hanks, MD as PCP - General (Internal Medicine) McKenzie, Mardene Celeste, MD as Consulting Physician (Urology)  Extended Emergency Contact Information Primary Emergency Contact: Nokes,David & Neoma Laming [place] Home Phone: [phone] Mobile Phone: [phone] Relation: Son Secondary Emergency Contact: Richardean Chimera [place] Home Phone: [phone] Relation: None    Allergies: Ceftin [cefuroxime]  Chief Complaint  Patient presents with  . Medical Management of Chronic Issues    Routine Visit    HPI: Patient is 84 y.o. male who is being seen for routine issues of atrial fib, hypertension, and dementia.  Past Medical History:  Diagnosis Date  . A-fib (HCC)   . AF (atrial fibrillation) (HCC) 03/31/2015  . Anorexia   . Aortic stenosis   . Arthritis   . Ataxia 05/29/2015  . Atrial fibrillation (HCC)   . Bacteremia due to Klebsiella pneumoniae 08/07/2015  . Candida UTI 10/01/2015  . Cerebral brain hemorrhage (HCC) 05/29/2015  . Chronic kidney disease   . CKD (chronic kidney disease), stage III (HCC) 03/31/2015  . Dementia    short term memory loss  . Depression 08/22/2016  . Encephalopathy acute   . Frequent falls 11/05/2015  . Gangrene (HCC)    perineal  . High cholesterol   . History of TIAs   . Hyperlipidemia   . Hypertension   . Major depressive disorder   . Multiple falls   . Myasthenia gravis (HCC)   . Neurogenic bladder 09/25/2015  . Sepsis due to Klebsiella pneumoniae (HCC) 08/07/2015  . Urinary incontinence   . Vertigo   . Vitamin D deficiency   . Weakness 05/29/2015    Past Surgical History:  Procedure Laterality Date  . BACK SURGERY  2006  . CERVICAL LAMINECTOMY  2002 ?  [name] CYSTOSCOPY N/A 06/26/2015   Procedure: CYSTOSCOPY;  Surgeon: Malen Gauze, MD;  Location: [facility];  Service: Urology;  Laterality: N/A;  . HERNIA REPAIR     1970s  . INSERTION OF SUPRAPUBIC CATHETER N/A 06/26/2015   Procedure: INSERTION OF SUPRAPUBIC CATHETER;  Surgeon: Malen Gauze, MD;  Location: [facility];  Service: Urology;  Laterality: N/A;    Allergies as of 11/15/2017      Reactions   Ceftin [cefuroxime] Diarrhea   Severe diarrhea      Medication List        Accurate as of 11/15/17 11:59 PM. Always use your most recent med list.          acetaminophen 500 MG tablet Commonly known as:  TYLENOL Take 500 mg by mouth every 6 (six) hours as needed for mild pain.   cholecalciferol 1000 units tablet Commonly known as:  VITAMIN D Take 1,000 Units by mouth daily.   docusate sodium 100 MG capsule Commonly known as:  COLACE Take 100 mg by mouth 2 (two) times daily.   ELIQUIS 2.5 MG Tabs tablet Generic drug:  apixaban Take 2.5 mg by mouth 2 (two) times daily.   eucerin cream Apply topically to bilateral legs daily for dry skin   metoprolol succinate 50 MG 24 hr tablet Commonly known as:  TOPROL-XL Take 50 mg by mouth every morning. Take with or immediately following a meal.   MYRBETRIQ 25 MG Tb24 tablet Generic drug:  mirabegron ER Take 25 mg by mouth daily.   NUTRITIONAL  SUPPLEMENT PO Offer Magic Cup as bedtime snack related to weight loss   ENSURE Take 237 mLs by mouth 2 (two) times daily between meals.   predniSONE 10 MG tablet Commonly known as:  DELTASONE Take 1 tablet (10 mg total) by mouth daily with breakfast.   pyridostigmine 60 MG tablet Commonly known as:  MESTINON Take 60 mg by mouth 3 (three) times daily.   senna 8.6 MG tablet Commonly known as:  SENOKOT Take 1 tablet by mouth 2 (two) times daily.   simvastatin 10 MG tablet Commonly known as:  ZOCOR Take 10 mg by mouth daily.   TAB-A-VITE Tabs Take 1 tablet by mouth daily.       No orders of the defined types were placed in this encounter.   Immunization History    Administered Date(s) Administered  . Influenza,inj,Quad PF,6+ Mos 07/05/2015  . Influenza-Unspecified 07/17/2016, 07/30/2017  . PPD Test 11/09/2015  . Pneumococcal Polysaccharide-23 07/05/2015  . Tdap 03/31/2015    Social History   Tobacco Use  . Smoking status: Former Smoker    Types: Cigars  . Smokeless tobacco: Never Used  Substance Use Topics  . Alcohol use: No    Alcohol/week: 0.0 oz    Review of Systems  DATA OBTAINED: from patient, nurse GENERAL:  no fevers, fatigue, appetite changes SKIN: No itching, rash HEENT: No complaint RESPIRATORY: No cough, wheezing, SOB CARDIAC: No chest pain, palpitations, lower extremity edema  GI: No abdominal pain, No N/V/D or constipation, No heartburn or reflux  GU: No dysuria, frequency or urgency, or incontinence  MUSCULOSKELETAL: No unrelieved bone/joint pain NEUROLOGIC: No headache, dizziness  PSYCHIATRIC: No overt anxiety or sadness  Vitals:   11/15/17 1208  BP: 110/68  Pulse: 62  Resp: 18  Temp: (!) 97.5 F (36.4 C)  SpO2: 95%   Body mass index is 25.68 kg/m. Physical Exam  GENERAL APPEARANCE: Alert, conversant, No acute distress  SKIN: No diaphoresis rash HEENT: Unremarkable RESPIRATORY: Breathing is even, unlabored. Lung sounds are clear   CARDIOVASCULAR: Heart RRR no murmurs, rubs or gallops. Trace peripheral edema  GASTROINTESTINAL: Abdomen is soft, non-tender, not distended w/ normal bowel sounds.  GENITOURINARY: Bladder non tender, not distended  MUSCULOSKELETAL: No abnormal joints or musculature NEUROLOGIC: Cranial nerves 2-12 grossly intact. Moves all extremities PSYCHIATRIC: Mood and affect appropriate to situation with mild dementia, no behavioral issues  Patient Active Problem List   Diagnosis Date Noted  . BPH (benign prostatic hyperplasia) 08/29/2017  . Hyperlipidemia 05/01/2017  . Abnormality on screening test 12/26/2016  . Current use of long term anticoagulation - Eliquis for A fib 12/16/2016   . Depression 08/22/2016  . Vitamin D deficiency 07/11/2016  . Encounter for family conference with patient present 12/14/2015  . Frequent falls 11/05/2015  . FTT (failure to thrive) in adult 09/25/2015  . Neurogenic bladder 09/25/2015  . DNR (do not resuscitate) 08/06/2015  . Catheter-associated urinary tract infection (HCC) 08/03/2015  . Myasthenia gravis (HCC) 07/13/2015  . Dementia 07/04/2015  . Pressure ulcer 07/04/2015  . Dizziness and giddiness 05/29/2015  . Weakness 05/29/2015  . Proximal limb muscle weakness 05/29/2015  . Ataxia 05/29/2015  . CKD (chronic kidney disease), stage III (HCC) 03/31/2015  . HTN (hypertension) 03/31/2015  . AF (atrial fibrillation) (HCC) 03/31/2015  . Dyslipidemia 03/31/2015    CMP     Component Value Date/Time   NA 141 09/21/2017   K 4.4 09/21/2017   CL 113 (H) 11/09/2015 0710   CO2 24 11/09/2015 0710  GLUCOSE 106 (H) 11/09/2015 0710   BUN 11 09/21/2017   CREATININE 0.8 09/21/2017   CREATININE 0.76 11/09/2015 0710   CALCIUM 8.4 (L) 11/09/2015 0710   PROT 5.1 (L) 11/06/2015 0330   PROT 7.7 05/29/2015 1319   ALBUMIN 2.5 (L) 11/06/2015 0330   ALBUMIN 4.0 05/29/2015 1319   AST 14 09/21/2017   ALT 16 09/21/2017   ALKPHOS 96 09/21/2017   BILITOT 0.8 11/06/2015 0330   BILITOT 1.2 05/29/2015 1319   GFRNONAA >60 11/09/2015 0710   GFRAA >60 11/09/2015 0710   Recent Labs    02/15/17 09/21/17  NA 145 141  K 4.3 4.4  BUN 16 11  CREATININE 0.9 0.8   Recent Labs    02/15/17 09/21/17  AST 23 14  ALT 26 16  ALKPHOS 76 96   Recent Labs    02/15/17 05/06/17 09/21/17  WBC 9.2 11.9 9.3  HGB 14.3 14.5 13.8  HCT 42 42 41  PLT 384 484* 435*   Recent Labs    02/15/17 09/21/17  CHOL 143 145  LDLCALC 77 72  TRIG 113 146   No results found for: MICROALBUR Lab Results  Component Value Date   TSH 6.30 (A) 09/21/2017   Lab Results  Component Value Date   HGBA1C 6.3 09/21/2017   Lab Results  Component Value Date   CHOL 145  09/21/2017   HDL 44 09/21/2017   LDLCALC 72 09/21/2017   TRIG 146 09/21/2017   CHOLHDL 9.3 01/20/2015    Significant Diagnostic Results in last 30 days:  No results found.  Assessment and Plan  AF (atrial fibrillation) (HCC) Stable; continue metoprolol XL 50 mg by mouth daily and Eliquis 2.5 mg twice a day for prophylaxis  HTN (hypertension) Controlled; continue Toprol-XL 50 mg by mouth daily  Dementia Stable without declines; continue supportive care    Caeley Dohrmann D. Lyn Hollingshead, MD

## 2017-11-20 ENCOUNTER — Encounter: Payer: Self-pay | Admitting: Internal Medicine

## 2017-11-20 NOTE — Assessment & Plan Note (Signed)
Stable; continue metoprolol XL 50 mg by mouth daily and Eliquis 2.5 mg twice a day for prophylaxis

## 2017-11-20 NOTE — Assessment & Plan Note (Signed)
Stable without declines; continue supportive care

## 2017-11-20 NOTE — Assessment & Plan Note (Signed)
Controlled; continue Toprol-XL 50 mg by mouth daily

## 2017-11-22 ENCOUNTER — Encounter: Payer: Self-pay | Admitting: Internal Medicine

## 2017-11-22 ENCOUNTER — Non-Acute Institutional Stay (SKILLED_NURSING_FACILITY): Payer: Medicare Other | Admitting: Internal Medicine

## 2017-11-22 DIAGNOSIS — M1990 Unspecified osteoarthritis, unspecified site: Secondary | ICD-10-CM | POA: Diagnosis not present

## 2017-11-22 NOTE — Progress Notes (Signed)
[facility] Room Number: 405P Place of Service:  SNF (31)  Margit HanksAlexander, Irish Piech D, MD  Patient Care Team: Margit HanksAlexander, Gredmarie Delange D, MD as PCP - General (Internal Medicine) McKenzie, Mardene CelestePatrick L, MD as Consulting Physician (Urology)  Extended Emergency Contact Information Primary Emergency Contact: Inocencio,David & Neoma LamingEllen  [place] Home Phone: [phone] Mobile Phone: [phone] Relation: Son Secondary Emergency Contact: Richardean ChimeraSmoak,Mark  [place] Home Phone: [phone] Relation: None    Allergies: Ceftin [cefuroxime]  Chief Complaint  Patient presents with  . Acute Visit    Pain and swelling in left knee    HPI: Patient is 84 y.o. male who nursing asked me to see for left knee swelling. Patient says when he went to bed last night his knee was normal. When he woke up this morning it was very swollen and painful to move. Patient has not had any fever, nausea, vomiting chills or any other systemic symptom. Patient has no history of gout. Pain is worse when patient moves that and better when patient keeps is still  Past Medical History:  Diagnosis Date  . A-fib (HCC)   . AF (atrial fibrillation) (HCC) 03/31/2015  . Anorexia   . Aortic stenosis   . Arthritis   . Ataxia 05/29/2015  . Atrial fibrillation (HCC)   . Bacteremia due to Klebsiella pneumoniae 08/07/2015  . Candida UTI 10/01/2015  . Cerebral brain hemorrhage (HCC) 05/29/2015  . Chronic kidney disease   . CKD (chronic kidney disease), stage III (HCC) 03/31/2015  . Dementia    short term memory loss  . Depression 08/22/2016  . Encephalopathy acute   . Frequent falls 11/05/2015  . Gangrene (HCC)    perineal  . High cholesterol   . History of TIAs   . Hyperlipidemia   . Hypertension   . Major depressive disorder   . Multiple falls   . Myasthenia gravis (HCC)   . Neurogenic bladder 09/25/2015  . Sepsis due to Klebsiella pneumoniae (HCC) 08/07/2015  . Urinary  incontinence   . Vertigo   . Vitamin D deficiency   . Weakness 05/29/2015    Past Surgical History:  Procedure Laterality Date  . BACK SURGERY  2006  . CERVICAL LAMINECTOMY  2002 ?  [name]. CYSTOSCOPY N/A 06/26/2015   Procedure: CYSTOSCOPY;  Surgeon: Malen GauzePatrick L McKenzie, MD;  Location: [facility];  Service: Urology;  Laterality: N/A;  . HERNIA REPAIR     1970s  . INSERTION OF SUPRAPUBIC CATHETER N/A 06/26/2015   Procedure: INSERTION OF SUPRAPUBIC CATHETER;  Surgeon: Malen GauzePatrick L McKenzie, MD;  Location: [facility];  Service: Urology;  Laterality: N/A;    Allergies as of 11/22/2017      Reactions   Ceftin [cefuroxime] Diarrhea   Severe diarrhea      Medication List        Accurate as of 11/22/17  2:56 PM. Always use your most recent med list.          acetaminophen 500 MG tablet Commonly known as:  TYLENOL Take 500 mg by mouth every 6 (six) hours as needed for mild pain.   cholecalciferol 1000 units tablet Commonly known as:  VITAMIN D Take 1,000 Units by mouth daily.   docusate sodium 100 MG capsule Commonly known as:  COLACE Take 100 mg by mouth 2 (two) times daily.   ELIQUIS 2.5 MG Tabs tablet Generic drug:  apixaban Take 2.5 mg by mouth 2 (two) times daily.  eucerin cream Apply topically to bilateral legs daily for dry skin   metoprolol succinate 50 MG 24 hr tablet Commonly known as:  TOPROL-XL Take 50 mg by mouth every morning. Take with or immediately following a meal.   MYRBETRIQ 50 MG Tb24 tablet Generic drug:  mirabegron ER Take 50 mg by mouth daily.   NUTRITIONAL SUPPLEMENT PO Offer Magic Cup as bedtime snack related to weight loss   ENSURE Take 237 mLs by mouth 2 (two) times daily between meals.   predniSONE 10 MG tablet Commonly known as:  DELTASONE Take 1 tablet (10 mg total) by mouth daily with breakfast.   pyridostigmine 60 MG tablet Commonly known as:  MESTINON Take 60 mg by mouth 3 (three) times daily.   senna 8.6 MG tablet Commonly known as:   SENOKOT Take 1 tablet by mouth 2 (two) times daily.   simvastatin 10 MG tablet Commonly known as:  ZOCOR Take 10 mg by mouth daily.   TAB-A-VITE Tabs Take 1 tablet by mouth daily.       No orders of the defined types were placed in this encounter.   Immunization History  Administered Date(s) Administered  . Influenza,inj,Quad PF,6+ Mos 07/05/2015  . Influenza-Unspecified 07/17/2016, 07/30/2017  . PPD Test 11/09/2015  . Pneumococcal Polysaccharide-23 07/05/2015  . Tdap 03/31/2015    Social History   Tobacco Use  . Smoking status: Former Smoker    Types: Cigars  . Smokeless tobacco: Never Used  Substance Use Topics  . Alcohol use: No    Alcohol/week: 0.0 oz    Review of Systems  DATA OBTAINED: from patient GENERAL:  no fevers, fatigue, appetite changes SKIN: No itching, rash HEENT: No complaint RESPIRATORY: No cough, wheezing, SOB CARDIAC: No chest pain, palpitations, lower extremity edema  GI: No abdominal pain, No N/V/D or constipation, No heartburn or reflux  GU: No dysuria, frequency or urgency, or incontinence  MUSCULOSKELETAL: Swelling, pain left knee NEUROLOGIC: No headache, dizziness  PSYCHIATRIC: No overt anxiety or sadness  Vitals:   11/22/17 1447  BP: 140/78  Pulse: 80  Resp: 18  Temp: (!) 96.4 F (35.8 C)  SpO2: 95%   Body mass index is 25.68 kg/m. Physical Exam  GENERAL APPEARANCE: Alert, conversant, No acute distress  SKIN: No diaphoresis rash HEENT: Unremarkable RESPIRATORY: Breathing is even, unlabored. Lung sounds are clear   CARDIOVASCULAR: Heart RRR no murmurs, rubs or gallops. No peripheral edema  GASTROINTESTINAL: Abdomen is soft, non-tender, not distended w/ normal bowel sounds.  GENITOURINARY: Bladder non tender, not distended  MUSCULOSKELETAL: Left knee with large effusion, mild warmth no redness; no signs of trauma NEUROLOGIC: Cranial nerves 2-12 grossly intact. Moves all extremities PSYCHIATRIC: Mood and affect  appropriate to situation, no behavioral issues  Patient Active Problem List   Diagnosis Date Noted  . BPH (benign prostatic hyperplasia) 08/29/2017  . Hyperlipidemia 05/01/2017  . Abnormality on screening test 12/26/2016  . Current use of long term anticoagulation - Eliquis for A fib 12/16/2016  . Depression 08/22/2016  . Vitamin D deficiency 07/11/2016  . Encounter for family conference with patient present 12/14/2015  . Frequent falls 11/05/2015  . FTT (failure to thrive) in adult 09/25/2015  . Neurogenic bladder 09/25/2015  . DNR (do not resuscitate) 08/06/2015  . Catheter-associated urinary tract infection (HCC) 08/03/2015  . Myasthenia gravis (HCC) 07/13/2015  . Dementia 07/04/2015  . Pressure ulcer 07/04/2015  . Dizziness and giddiness 05/29/2015  . Weakness 05/29/2015  . Proximal limb muscle weakness 05/29/2015  .  Ataxia 05/29/2015  . CKD (chronic kidney disease), stage III (HCC) 03/31/2015  . HTN (hypertension) 03/31/2015  . AF (atrial fibrillation) (HCC) 03/31/2015  . Dyslipidemia 03/31/2015    CMP     Component Value Date/Time   NA 141 09/21/2017   K 4.4 09/21/2017   CL 113 (H) 11/09/2015 0710   CO2 24 11/09/2015 0710   GLUCOSE 106 (H) 11/09/2015 0710   BUN 11 09/21/2017   CREATININE 0.8 09/21/2017   CREATININE 0.76 11/09/2015 0710   CALCIUM 8.4 (L) 11/09/2015 0710   PROT 5.1 (L) 11/06/2015 0330   PROT 7.7 05/29/2015 1319   ALBUMIN 2.5 (L) 11/06/2015 0330   ALBUMIN 4.0 05/29/2015 1319   AST 14 09/21/2017   ALT 16 09/21/2017   ALKPHOS 96 09/21/2017   BILITOT 0.8 11/06/2015 0330   BILITOT 1.2 05/29/2015 1319   GFRNONAA >60 11/09/2015 0710   GFRAA >60 11/09/2015 0710   Recent Labs    02/15/17 09/21/17  NA 145 141  K 4.3 4.4  BUN 16 11  CREATININE 0.9 0.8   Recent Labs    02/15/17 09/21/17  AST 23 14  ALT 26 16  ALKPHOS 76 96   Recent Labs    02/15/17 05/06/17 09/21/17  WBC 9.2 11.9 9.3  HGB 14.3 14.5 13.8  HCT 42 42 41  PLT 384 484* 435*    Recent Labs    02/15/17 09/21/17  CHOL 143 145  LDLCALC 77 72  TRIG 113 146   No results found for: MICROALBUR Lab Results  Component Value Date   TSH 6.30 (A) 09/21/2017   Lab Results  Component Value Date   HGBA1C 6.3 09/21/2017   Lab Results  Component Value Date   CHOL 145 09/21/2017   HDL 44 09/21/2017   LDLCALC 72 09/21/2017   TRIG 146 09/21/2017   CHOLHDL 9.3 01/20/2015    Significant Diagnostic Results in last 30 days:  No results found.  Assessment and Plan  Inflammatory arthritis-because of the progressive onset in the amount of effusion I favor an inflammatory process; will treat with prednisone 60 mg today, 40 mg tomorrow and 20 mg for the next 5 days; we will monitor response    Thurston Hole D. Lyn Hollingshead, MD

## 2017-11-23 ENCOUNTER — Encounter: Payer: Self-pay | Admitting: Internal Medicine

## 2017-12-02 DIAGNOSIS — M25562 Pain in left knee: Secondary | ICD-10-CM | POA: Diagnosis not present

## 2017-12-02 DIAGNOSIS — I739 Peripheral vascular disease, unspecified: Secondary | ICD-10-CM | POA: Diagnosis not present

## 2017-12-02 DIAGNOSIS — B351 Tinea unguium: Secondary | ICD-10-CM | POA: Diagnosis not present

## 2017-12-06 DIAGNOSIS — R3915 Urgency of urination: Secondary | ICD-10-CM | POA: Diagnosis not present

## 2017-12-07 DIAGNOSIS — F331 Major depressive disorder, recurrent, moderate: Secondary | ICD-10-CM | POA: Diagnosis not present

## 2017-12-07 DIAGNOSIS — F419 Anxiety disorder, unspecified: Secondary | ICD-10-CM | POA: Diagnosis not present

## 2017-12-07 DIAGNOSIS — F0390 Unspecified dementia without behavioral disturbance: Secondary | ICD-10-CM | POA: Diagnosis not present

## 2017-12-13 DIAGNOSIS — R338 Other retention of urine: Secondary | ICD-10-CM | POA: Diagnosis not present

## 2017-12-14 ENCOUNTER — Encounter: Payer: Self-pay | Admitting: Internal Medicine

## 2017-12-14 ENCOUNTER — Non-Acute Institutional Stay (SKILLED_NURSING_FACILITY): Payer: Medicare Other | Admitting: Internal Medicine

## 2017-12-14 DIAGNOSIS — E785 Hyperlipidemia, unspecified: Secondary | ICD-10-CM

## 2017-12-14 DIAGNOSIS — N400 Enlarged prostate without lower urinary tract symptoms: Secondary | ICD-10-CM | POA: Diagnosis not present

## 2017-12-14 DIAGNOSIS — G7000 Myasthenia gravis without (acute) exacerbation: Secondary | ICD-10-CM

## 2017-12-14 NOTE — Progress Notes (Signed)
Location:  [facility] Room Number: 405-B Place of Service:  SNF (31)  Margit Hanks, MD  Patient Care Team: Margit Hanks, MD as PCP - General (Internal Medicine) McKenzie, Mardene Celeste, MD as Consulting Physician (Urology)  Extended Emergency Contact Information Primary Emergency Contact: Sinyard,David & Neoma Laming [place] Home Phone: [phone] Mobile Phone: [phone] Relation: Son Secondary Emergency Contact: Richardean Chimera [place] Home Phone: [phone] Relation: None    Allergies: Ceftin [cefuroxime]  Chief Complaint  Patient presents with  . Medical Management of Chronic Issues    Routine visit    HPI: Patient is 84 y.o. male who   Past Medical History:  Diagnosis Date  . A-fib (HCC)   . AF (atrial fibrillation) (HCC) 03/31/2015  . Anorexia   . Aortic stenosis   . Arthritis   . Ataxia 05/29/2015  . Atrial fibrillation (HCC)   . Bacteremia due to Klebsiella pneumoniae 08/07/2015  . Candida UTI 10/01/2015  . Cerebral brain hemorrhage (HCC) 05/29/2015  . Chronic kidney disease   . CKD (chronic kidney disease), stage III (HCC) 03/31/2015  . Dementia    short term memory loss  . Depression 08/22/2016  . Encephalopathy acute   . Frequent falls 11/05/2015  . Gangrene (HCC)    perineal  . High cholesterol   . History of TIAs   . Hyperlipidemia   . Hypertension   . Major depressive disorder   . Multiple falls   . Myasthenia gravis (HCC)   . Neurogenic bladder 09/25/2015  . Sepsis due to Klebsiella pneumoniae (HCC) 08/07/2015  . Urinary incontinence   . Vertigo   . Vitamin D deficiency   . Weakness 05/29/2015    Past Surgical History:  Procedure Laterality Date  . BACK SURGERY  2006  . CERVICAL LAMINECTOMY  2002 ?  [name] CYSTOSCOPY N/A 06/26/2015   Procedure: CYSTOSCOPY;  Surgeon: Malen Gauze, MD;  Location: [facility];  Service: Urology;  Laterality: N/A;  . HERNIA REPAIR     1970s  .  INSERTION OF SUPRAPUBIC CATHETER N/A 06/26/2015   Procedure: INSERTION OF SUPRAPUBIC CATHETER;  Surgeon: Malen Gauze, MD;  Location: [facility];  Service: Urology;  Laterality: N/A;    Allergies as of 12/14/2017      Reactions   Ceftin [cefuroxime] Diarrhea   Severe diarrhea      Medication List        Accurate as of 12/14/17 12:52 PM. Always use your most recent med list.          acetaminophen 500 MG tablet Commonly known as:  TYLENOL Take 500 mg by mouth every 6 (six) hours as needed for mild pain.   cholecalciferol 1000 units tablet Commonly known as:  VITAMIN D Take 1,000 Units by mouth daily.   diclofenac sodium 1 % Gel Commonly known as:  VOLTAREN Apply 4 g topically 3 (three) times daily.   docusate sodium 100 MG capsule Commonly known as:  COLACE Take 100 mg by mouth 2 (two) times daily.   ELIQUIS 2.5 MG Tabs tablet Generic drug:  apixaban Take 2.5 mg by mouth 2 (two) times daily.   eucerin cream Apply topically to bilateral legs daily for dry skin   metoprolol succinate 50 MG 24 hr tablet Commonly known as:  TOPROL-XL Take 50 mg by mouth every morning. Take with or immediately following a meal.   MYRBETRIQ 50 MG Tb24 tablet Generic drug:  mirabegron ER  Take 50 mg by mouth daily.   NUTRITIONAL SUPPLEMENT PO Offer Magic Cup as bedtime snack related to weight loss   ENSURE Take 237 mLs by mouth 2 (two) times daily between meals.   pyridostigmine 60 MG tablet Commonly known as:  MESTINON Take 60 mg by mouth 3 (three) times daily.   senna 8.6 MG tablet Commonly known as:  SENOKOT Take 1 tablet by mouth 2 (two) times daily.   simvastatin 10 MG tablet Commonly known as:  ZOCOR Take 10 mg by mouth daily.   TAB-A-VITE Tabs Take 1 tablet by mouth daily.       No orders of the defined types were placed in this encounter.   Immunization History  Administered Date(s) Administered  . Influenza,inj,Quad PF,6+ Mos 07/05/2015  .  Influenza-Unspecified 07/17/2016, 07/30/2017  . PPD Test 11/09/2015  . Pneumococcal Polysaccharide-23 07/05/2015  . Tdap 03/31/2015    Social History   Tobacco Use  . Smoking status: Former Smoker    Types: Cigars  . Smokeless tobacco: Never Used  Substance Use Topics  . Alcohol use: No    Alcohol/week: 0.0 oz    Review of Systems  DATA OBTAINED: from patient, nurse, medical record, family member GENERAL:  no fevers, fatigue, appetite changes SKIN: No itching, rash HEENT: No complaint RESPIRATORY: No cough, wheezing, SOB CARDIAC: No chest pain, palpitations, lower extremity edema  GI: No abdominal pain, No N/V/D or constipation, No heartburn or reflux  GU: No dysuria, frequency or urgency, or incontinence  MUSCULOSKELETAL: No unrelieved bone/joint pain NEUROLOGIC: No headache, dizziness  PSYCHIATRIC: No overt anxiety or sadness  Vitals:   12/14/17 1236  BP: 122/68  Pulse: 73  Resp: 16  Temp: (!) 97.5 F (36.4 C)  SpO2: 98%   Body mass index is 23.93 kg/m. Physical Exam  GENERAL APPEARANCE: Alert, conversant, No acute distress  SKIN: No diaphoresis rash HEENT: Unremarkable RESPIRATORY: Breathing is even, unlabored. Lung sounds are clear   CARDIOVASCULAR: Heart RRR no murmurs, rubs or gallops. No peripheral edema  GASTROINTESTINAL: Abdomen is soft, non-tender, not distended w/ normal bowel sounds.  GENITOURINARY: Bladder non tender, not distended  MUSCULOSKELETAL: No abnormal joints or musculature NEUROLOGIC: Cranial nerves 2-12 grossly intact. Moves all extremities PSYCHIATRIC: Mood and affect appropriate to situation, no behavioral issues  Patient Active Problem List   Diagnosis Date Noted  . BPH (benign prostatic hyperplasia) 08/29/2017  . Hyperlipidemia 05/01/2017  . Abnormality on screening test 12/26/2016  . Current use of long term anticoagulation - Eliquis for A fib 12/16/2016  . Depression 08/22/2016  . Vitamin D deficiency 07/11/2016  . Encounter  for family conference with patient present 12/14/2015  . Frequent falls 11/05/2015  . FTT (failure to thrive) in adult 09/25/2015  . Neurogenic bladder 09/25/2015  . DNR (do not resuscitate) 08/06/2015  . Catheter-associated urinary tract infection (HCC) 08/03/2015  . Myasthenia gravis (HCC) 07/13/2015  . Dementia 07/04/2015  . Pressure ulcer 07/04/2015  . Dizziness and giddiness 05/29/2015  . Weakness 05/29/2015  . Proximal limb muscle weakness 05/29/2015  . Ataxia 05/29/2015  . CKD (chronic kidney disease), stage III (HCC) 03/31/2015  . HTN (hypertension) 03/31/2015  . AF (atrial fibrillation) (HCC) 03/31/2015  . Dyslipidemia 03/31/2015    CMP     Component Value Date/Time   NA 141 09/21/2017   K 4.4 09/21/2017   CL 113 (H) 11/09/2015 0710   CO2 24 11/09/2015 0710   GLUCOSE 106 (H) 11/09/2015 0710   BUN 11 09/21/2017  CREATININE 0.8 09/21/2017   CREATININE 0.76 11/09/2015 0710   CALCIUM 8.4 (L) 11/09/2015 0710   PROT 5.1 (L) 11/06/2015 0330   PROT 7.7 05/29/2015 1319   ALBUMIN 2.5 (L) 11/06/2015 0330   ALBUMIN 4.0 05/29/2015 1319   AST 14 09/21/2017   ALT 16 09/21/2017   ALKPHOS 96 09/21/2017   BILITOT 0.8 11/06/2015 0330   BILITOT 1.2 05/29/2015 1319   GFRNONAA >60 11/09/2015 0710   GFRAA >60 11/09/2015 0710   Recent Labs    02/15/17 09/21/17  NA 145 141  K 4.3 4.4  BUN 16 11  CREATININE 0.9 0.8   Recent Labs    02/15/17 09/21/17  AST 23 14  ALT 26 16  ALKPHOS 76 96   Recent Labs    02/15/17 05/06/17 09/21/17  WBC 9.2 11.9 9.3  HGB 14.3 14.5 13.8  HCT 42 42 41  PLT 384 484* 435*   Recent Labs    02/15/17 09/21/17  CHOL 143 145  LDLCALC 77 72  TRIG 113 146   No results found for: MICROALBUR Lab Results  Component Value Date   TSH 6.30 (A) 09/21/2017   Lab Results  Component Value Date   HGBA1C 6.3 09/21/2017   Lab Results  Component Value Date   CHOL 145 09/21/2017   HDL 44 09/21/2017   LDLCALC 72 09/21/2017   TRIG 146 09/21/2017     CHOLHDL 9.3 01/20/2015    Significant Diagnostic Results in last 30 days:  No results found.  Assessment and Plan   Labs/tests ordered:   This note contains small variables that are not compatible with my note which is why I created a note of my own originally; therefore , unfortunately I have had to replace my note on top of this one since I could not cancel this note Thurston Holenne D. Lyn HollingsheadAlexander, MD

## 2017-12-15 DIAGNOSIS — R627 Adult failure to thrive: Secondary | ICD-10-CM | POA: Diagnosis not present

## 2017-12-15 DIAGNOSIS — R531 Weakness: Secondary | ICD-10-CM | POA: Diagnosis not present

## 2017-12-15 DIAGNOSIS — R413 Other amnesia: Secondary | ICD-10-CM | POA: Diagnosis not present

## 2017-12-18 ENCOUNTER — Encounter: Payer: Self-pay | Admitting: Internal Medicine

## 2017-12-18 NOTE — Progress Notes (Signed)
Location:  [facility] Room Number: 405-B Place of Service:  SNF (31)  Margit Hanks, MD  Patient Care Team: Margit Hanks, MD as PCP - General (Internal Medicine) McKenzie, Mardene Celeste, MD as Consulting Physician (Urology)  Extended Emergency Contact Information Primary Emergency Contact: Lamoreaux,David & Neoma [place]aming [place] Home Phone: [phone] Mobile Phone: [phone] Relation: Son Secondary Emergency Contact: Richardean Chimera [place] Home Phone: [phone] Relation: None    Allergies: Ceftin [cefuroxime]  Chief Complaint  Patient presents with  . Medical Management of Chronic Issues    Routine visit    HPI: Patient is 84 y.o. male who is being seen for routine issues of myasthenia gravis, BPH, and hyperlipidemia.  Past Medical History:  Diagnosis Date  . A-fib (HCC)   . AF (atrial fibrillation) (HCC) 03/31/2015  . Anorexia   . Aortic stenosis   . Arthritis   . Ataxia 05/29/2015  . Atrial fibrillation (HCC)   . Bacteremia due to Klebsiella pneumoniae 08/07/2015  . Candida UTI 10/01/2015  . Cerebral brain hemorrhage (HCC) 05/29/2015  . Chronic kidney disease   . CKD (chronic kidney disease), stage III (HCC) 03/31/2015  . Dementia    short term memory loss  . Depression 08/22/2016  . Encephalopathy acute   . Frequent falls 11/05/2015  . Gangrene (HCC)    perineal  . High cholesterol   . History of TIAs   . Hyperlipidemia   . Hypertension   . Major depressive disorder   . Multiple falls   . Myasthenia gravis (HCC)   . Neurogenic bladder 09/25/2015  . Sepsis due to Klebsiella pneumoniae (HCC) 08/07/2015  . Urinary incontinence   . Vertigo   . Vitamin D deficiency   . Weakness 05/29/2015    Past Surgical History:  Procedure Laterality Date  . BACK SURGERY  2006  . CERVICAL LAMINECTOMY  2002 ?  [name] CYSTOSCOPY N/A 06/26/2015   Procedure: CYSTOSCOPY;  Surgeon: Malen Gauze, MD;  Location:  [facility];  Service: Urology;  Laterality: N/A;  . HERNIA REPAIR     1970s  . INSERTION OF SUPRAPUBIC CATHETER N/A 06/26/2015   Procedure: INSERTION OF SUPRAPUBIC CATHETER;  Surgeon: Malen Gauze, MD;  Location: [facility];  Service: Urology;  Laterality: N/A;    Allergies as of 12/14/2017      Reactions   Ceftin [cefuroxime] Diarrhea   Severe diarrhea      Medication List        Accurate as of 12/14/17 11:59 PM. Always use your most recent med list.          acetaminophen 500 MG tablet Commonly known as:  TYLENOL Take 500 mg by mouth every 6 (six) hours as needed for mild pain.   cholecalciferol 1000 units tablet Commonly known as:  VITAMIN D Take 1,000 Units by mouth daily.   diclofenac sodium 1 % Gel Commonly known as:  VOLTAREN Apply 4 g topically 3 (three) times daily.   docusate sodium 100 MG capsule Commonly known as:  COLACE Take 100 mg by mouth 2 (two) times daily.   ELIQUIS 2.5 MG Tabs tablet Generic drug:  apixaban Take 2.5 mg by mouth 2 (two) times daily.   eucerin cream Apply topically to bilateral legs daily for dry skin   metoprolol succinate 50 MG 24 hr tablet Commonly known as:  TOPROL-XL Take 50 mg by mouth every morning. Take with or immediately following a meal.  MYRBETRIQ 50 MG Tb24 tablet Generic drug:  mirabegron ER Take 50 mg by mouth daily.   NUTRITIONAL SUPPLEMENT PO Offer Magic Cup as bedtime snack related to weight loss   ENSURE Take 237 mLs by mouth 2 (two) times daily between meals.   pyridostigmine 60 MG tablet Commonly known as:  MESTINON Take 60 mg by mouth 3 (three) times daily.   senna 8.6 MG tablet Commonly known as:  SENOKOT Take 1 tablet by mouth 2 (two) times daily.   simvastatin 10 MG tablet Commonly known as:  ZOCOR Take 10 mg by mouth daily.   TAB-A-VITE Tabs Take 1 tablet by mouth daily.       No orders of the defined types were placed in this encounter.   Immunization History  Administered Date(s)  Administered  . Influenza,inj,Quad PF,6+ Mos 07/05/2015  . Influenza-Unspecified 07/17/2016, 07/30/2017  . PPD Test 11/09/2015  . Pneumococcal Polysaccharide-23 07/05/2015  . Tdap 03/31/2015    Social History   Tobacco Use  . Smoking status: Former Smoker    Types: Cigars  . Smokeless tobacco: Never Used  Substance Use Topics  . Alcohol use: No    Alcohol/week: 0.0 oz    Review of Systems  DATA OBTAINED: from patient, nurse GENERAL:  no fevers, fatigue, appetite changes SKIN: No itching, rash HEENT: No complaint RESPIRATORY: No cough, wheezing, SOB CARDIAC: No chest pain, palpitations, lower extremity edema  GI: No abdominal pain, No N/V/D or constipation, No heartburn or reflux  GU: No dysuria, frequency or urgency, or incontinence  MUSCULOSKELETAL: No unrelieved bone/joint pain NEUROLOGIC: No headache, dizziness  PSYCHIATRIC: No overt anxiety or sadness  Vitals:   12/14/17 1236  BP: 122/68  Pulse: 73  Resp: 16  Temp: (!) 97.5 F (36.4 C)  SpO2: 98%   Body mass index is 23.93 kg/m. Physical Exam  GENERAL APPEARANCE: Alert, conversant, No acute distress  SKIN: No diaphoresis rash HEENT: Unremarkable RESPIRATORY: Breathing is even, unlabored. Lung sounds are clear   CARDIOVASCULAR: Heart RRR no murmurs, rubs or gallops. No peripheral edema  GASTROINTESTINAL: Abdomen is soft, non-tender, not distended w/ normal bowel sounds.  GENITOURINARY: Bladder non tender, not distended  MUSCULOSKELETAL: Arthritis left knee NEUROLOGIC: Cranial nerves 2-12 grossly intact. Moves all extremities PSYCHIATRIC: Mood and affect appropriate to situation, no behavioral issues  Patient Active Problem List   Diagnosis Date Noted  . BPH (benign prostatic hyperplasia) 08/29/2017  . Hyperlipidemia 05/01/2017  . Abnormality on screening test 12/26/2016  . Current use of long term anticoagulation - Eliquis for A fib 12/16/2016  . Depression 08/22/2016  . Vitamin D deficiency  07/11/2016  . Encounter for family conference with patient present 12/14/2015  . Frequent falls 11/05/2015  . FTT (failure to thrive) in adult 09/25/2015  . Neurogenic bladder 09/25/2015  . DNR (do not resuscitate) 08/06/2015  . Catheter-associated urinary tract infection (HCC) 08/03/2015  . Myasthenia gravis (HCC) 07/13/2015  . Dementia 07/04/2015  . Pressure ulcer 07/04/2015  . Dizziness and giddiness 05/29/2015  . Weakness 05/29/2015  . Proximal limb muscle weakness 05/29/2015  . Ataxia 05/29/2015  . CKD (chronic kidney disease), stage III (HCC) 03/31/2015  . HTN (hypertension) 03/31/2015  . AF (atrial fibrillation) (HCC) 03/31/2015  . Dyslipidemia 03/31/2015    CMP     Component Value Date/Time   NA 141 09/21/2017   K 4.4 09/21/2017   CL 113 (H) 11/09/2015 0710   CO2 24 11/09/2015 0710   GLUCOSE 106 (H) 11/09/2015 0710   BUN  11 09/21/2017   CREATININE 0.8 09/21/2017   CREATININE 0.76 11/09/2015 0710   CALCIUM 8.4 (L) 11/09/2015 0710   PROT 5.1 (L) 11/06/2015 0330   PROT 7.7 05/29/2015 1319   ALBUMIN 2.5 (L) 11/06/2015 0330   ALBUMIN 4.0 05/29/2015 1319   AST 14 09/21/2017   ALT 16 09/21/2017   ALKPHOS 96 09/21/2017   BILITOT 0.8 11/06/2015 0330   BILITOT 1.2 05/29/2015 1319   GFRNONAA >60 11/09/2015 0710   GFRAA >60 11/09/2015 0710   Recent Labs    02/15/17 09/21/17  NA 145 141  K 4.3 4.4  BUN 16 11  CREATININE 0.9 0.8   Recent Labs    02/15/17 09/21/17  AST 23 14  ALT 26 16  ALKPHOS 76 96   Recent Labs    02/15/17 05/06/17 09/21/17  WBC 9.2 11.9 9.3  HGB 14.3 14.5 13.8  HCT 42 42 41  PLT 384 484* 435*   Recent Labs    02/15/17 09/21/17  CHOL 143 145  LDLCALC 77 72  TRIG 113 146   No results found for: MICROALBUR Lab Results  Component Value Date   TSH 6.30 (A) 09/21/2017   Lab Results  Component Value Date   HGBA1C 6.3 09/21/2017   Lab Results  Component Value Date   CHOL 145 09/21/2017   HDL 44 09/21/2017   LDLCALC 72 09/21/2017    TRIG 146 09/21/2017   CHOLHDL 9.3 01/20/2015    Significant Diagnostic Results in last 30 days:  No results found.  Assessment and Plan  Myasthenia gravis (HCC) Stable; no problems had been noted; patient has been increased to Mestinon 60 mg 3 times a day  BPH (benign prostatic hyperplasia) Stable; continue Myrbetriq which has been increased to 50 mg daily  Dyslipidemia LDL 72 HDL 44; good control with Zocor 10 mg daily    Merrilee SeashoreAnne Yvette Roark, MD

## 2017-12-18 NOTE — Assessment & Plan Note (Signed)
Stable; no problems had been noted; patient has been increased to Mestinon 60 mg 3 times a day

## 2017-12-18 NOTE — Assessment & Plan Note (Signed)
Stable; continue Myrbetriq which has been increased to 50 mg daily

## 2017-12-18 NOTE — Assessment & Plan Note (Signed)
LDL 72 HDL 44; good control with Zocor 10 mg daily

## 2017-12-20 ENCOUNTER — Non-Acute Institutional Stay (SKILLED_NURSING_FACILITY): Payer: Medicare Other | Admitting: Internal Medicine

## 2017-12-20 DIAGNOSIS — R451 Restlessness and agitation: Secondary | ICD-10-CM | POA: Diagnosis not present

## 2017-12-21 ENCOUNTER — Non-Acute Institutional Stay (SKILLED_NURSING_FACILITY): Payer: Medicare Other | Admitting: Internal Medicine

## 2017-12-21 DIAGNOSIS — R627 Adult failure to thrive: Secondary | ICD-10-CM

## 2017-12-21 DIAGNOSIS — T83010A Breakdown (mechanical) of cystostomy catheter, initial encounter: Secondary | ICD-10-CM

## 2017-12-22 ENCOUNTER — Non-Acute Institutional Stay (SKILLED_NURSING_FACILITY): Payer: Medicare Other | Admitting: Internal Medicine

## 2017-12-22 DIAGNOSIS — N390 Urinary tract infection, site not specified: Secondary | ICD-10-CM | POA: Diagnosis not present

## 2017-12-22 DIAGNOSIS — R338 Other retention of urine: Secondary | ICD-10-CM | POA: Diagnosis not present

## 2017-12-22 DIAGNOSIS — N319 Neuromuscular dysfunction of bladder, unspecified: Secondary | ICD-10-CM

## 2017-12-22 DIAGNOSIS — Z515 Encounter for palliative care: Secondary | ICD-10-CM | POA: Diagnosis not present

## 2017-12-22 DIAGNOSIS — T83010D Breakdown (mechanical) of cystostomy catheter, subsequent encounter: Secondary | ICD-10-CM | POA: Diagnosis not present

## 2017-12-23 DIAGNOSIS — F0151 Vascular dementia with behavioral disturbance: Secondary | ICD-10-CM | POA: Diagnosis not present

## 2017-12-23 DIAGNOSIS — G7000 Myasthenia gravis without (acute) exacerbation: Secondary | ICD-10-CM | POA: Diagnosis not present

## 2017-12-23 DIAGNOSIS — N183 Chronic kidney disease, stage 3 (moderate): Secondary | ICD-10-CM | POA: Diagnosis not present

## 2017-12-23 DIAGNOSIS — I10 Essential (primary) hypertension: Secondary | ICD-10-CM | POA: Diagnosis not present

## 2017-12-23 DIAGNOSIS — R54 Age-related physical debility: Secondary | ICD-10-CM | POA: Diagnosis not present

## 2017-12-23 DIAGNOSIS — I350 Nonrheumatic aortic (valve) stenosis: Secondary | ICD-10-CM | POA: Diagnosis not present

## 2017-12-23 DIAGNOSIS — R443 Hallucinations, unspecified: Secondary | ICD-10-CM | POA: Diagnosis not present

## 2017-12-23 DIAGNOSIS — I4891 Unspecified atrial fibrillation: Secondary | ICD-10-CM | POA: Diagnosis not present

## 2017-12-23 DIAGNOSIS — E785 Hyperlipidemia, unspecified: Secondary | ICD-10-CM | POA: Diagnosis not present

## 2017-12-23 DIAGNOSIS — Z8673 Personal history of transient ischemic attack (TIA), and cerebral infarction without residual deficits: Secondary | ICD-10-CM | POA: Diagnosis not present

## 2017-12-23 DIAGNOSIS — I672 Cerebral atherosclerosis: Secondary | ICD-10-CM | POA: Diagnosis not present

## 2017-12-23 DIAGNOSIS — F339 Major depressive disorder, recurrent, unspecified: Secondary | ICD-10-CM | POA: Diagnosis not present

## 2017-12-24 DIAGNOSIS — I672 Cerebral atherosclerosis: Secondary | ICD-10-CM | POA: Diagnosis not present

## 2017-12-24 DIAGNOSIS — I4891 Unspecified atrial fibrillation: Secondary | ICD-10-CM | POA: Diagnosis not present

## 2017-12-24 DIAGNOSIS — Z8673 Personal history of transient ischemic attack (TIA), and cerebral infarction without residual deficits: Secondary | ICD-10-CM | POA: Diagnosis not present

## 2017-12-24 DIAGNOSIS — I350 Nonrheumatic aortic (valve) stenosis: Secondary | ICD-10-CM | POA: Diagnosis not present

## 2017-12-24 DIAGNOSIS — F0151 Vascular dementia with behavioral disturbance: Secondary | ICD-10-CM | POA: Diagnosis not present

## 2017-12-24 DIAGNOSIS — N183 Chronic kidney disease, stage 3 (moderate): Secondary | ICD-10-CM | POA: Diagnosis not present

## 2017-12-25 ENCOUNTER — Encounter: Payer: Self-pay | Admitting: Internal Medicine

## 2017-12-25 DIAGNOSIS — I4891 Unspecified atrial fibrillation: Secondary | ICD-10-CM | POA: Diagnosis not present

## 2017-12-25 DIAGNOSIS — Z8673 Personal history of transient ischemic attack (TIA), and cerebral infarction without residual deficits: Secondary | ICD-10-CM | POA: Diagnosis not present

## 2017-12-25 DIAGNOSIS — I672 Cerebral atherosclerosis: Secondary | ICD-10-CM | POA: Diagnosis not present

## 2017-12-25 DIAGNOSIS — I350 Nonrheumatic aortic (valve) stenosis: Secondary | ICD-10-CM | POA: Diagnosis not present

## 2017-12-25 DIAGNOSIS — N183 Chronic kidney disease, stage 3 (moderate): Secondary | ICD-10-CM | POA: Diagnosis not present

## 2017-12-25 DIAGNOSIS — F0151 Vascular dementia with behavioral disturbance: Secondary | ICD-10-CM | POA: Diagnosis not present

## 2017-12-26 ENCOUNTER — Encounter: Payer: Self-pay | Admitting: Internal Medicine

## 2017-12-26 NOTE — Progress Notes (Signed)
Location:  [facility]   Place of Service:  SNF (31)  Margit Hanks, MD  Patient Care Team: Margit Hanks, MD as PCP - General (Internal Medicine) McKenzie, Mardene Celeste, MD as Consulting Physician (Urology)  Extended Emergency Contact Information Primary Emergency Contact: Lucus,David & Neoma [place] Home Phone: [phone] Mobile Phone: [phone] Relation: Son Secondary Emergency Contact: Richardean Chimera [place] Home Phone: [phone] Relation: None    Allergies: Ceftin [cefuroxime]  Chief Complaint  Patient presents with  . Acute Visit    HPI: Patient is 84 y.o. male who is being seen regarding his suprapubic catheter. Patient has been failing to thrive, and is being seen for hospice, after being referred by palliative care yesterday. Problem at hand is whether patient is appropriate for a Foley catheter or whether he absolutely has to have a suprapubic catheter which will require a visit to the urology office monthly.  Past Medical History:  Diagnosis Date  . A-fib (HCC)   . AF (atrial fibrillation) (HCC) 03/31/2015  . Anorexia   . Aortic stenosis   . Arthritis   . Ataxia 05/29/2015  . Atrial fibrillation (HCC)   . Bacteremia due to Klebsiella pneumoniae 08/07/2015  . Candida UTI 10/01/2015  . Cerebral brain hemorrhage (HCC) 05/29/2015  . Chronic kidney disease   . CKD (chronic kidney disease), stage III (HCC) 03/31/2015  . Dementia    short term memory loss  . Depression 08/22/2016  . Encephalopathy acute   . Frequent falls 11/05/2015  . Gangrene (HCC)    perineal  . High cholesterol   . History of TIAs   . Hyperlipidemia   . Hypertension   . Major depressive disorder   . Multiple falls   . Myasthenia gravis (HCC)   . Neurogenic bladder 09/25/2015  . Sepsis due to Klebsiella pneumoniae (HCC) 08/07/2015  . Urinary incontinence   . Vertigo   . Vitamin D deficiency   . Weakness 05/29/2015    Past  Surgical History:  Procedure Laterality Date  . BACK SURGERY  2006  . CERVICAL LAMINECTOMY  2002 ?  [name] CYSTOSCOPY N/A 06/26/2015   Procedure: CYSTOSCOPY;  Surgeon: Malen Gauze, MD;  Location: [facility];  Service: Urology;  Laterality: N/A;  . HERNIA REPAIR     1970s  . INSERTION OF SUPRAPUBIC CATHETER N/A 06/26/2015   Procedure: INSERTION OF SUPRAPUBIC CATHETER;  Surgeon: Malen Gauze, MD;  Location: [facility];  Service: Urology;  Laterality: N/A;    Allergies as of 12/22/2017      Reactions   Ceftin [cefuroxime] Diarrhea   Severe diarrhea      Medication List        Accurate as of 12/22/17 11:59 PM. Always use your most recent med list.          acetaminophen 500 MG tablet Commonly known as:  TYLENOL Take 500 mg by mouth every 6 (six) hours as needed for mild pain.   cholecalciferol 1000 units tablet Commonly known as:  VITAMIN D Take 1,000 Units by mouth daily.   diclofenac sodium 1 % Gel Commonly known as:  VOLTAREN Apply 4 g topically 3 (three) times daily.   docusate sodium 100 MG capsule Commonly known as:  COLACE Take 100 mg by mouth 2 (two) times daily.   ELIQUIS 2.5 MG Tabs tablet Generic drug:  apixaban Take 2.5 mg by mouth 2 (two) times daily.   eucerin cream Apply topically to  bilateral legs daily for dry skin   metoprolol succinate 50 MG 24 hr tablet Commonly known as:  TOPROL-XL Take 50 mg by mouth every morning. Take with or immediately following a meal.   MYRBETRIQ 50 MG Tb24 tablet Generic drug:  mirabegron ER Take 50 mg by mouth daily.   NUTRITIONAL SUPPLEMENT PO Offer Magic Cup as bedtime snack related to weight loss   ENSURE Take 237 mLs by mouth 2 (two) times daily between meals.   pyridostigmine 60 MG tablet Commonly known as:  MESTINON Take 60 mg by mouth 3 (three) times daily.   senna 8.6 MG tablet Commonly known as:  SENOKOT Take 1 tablet by mouth 2 (two) times daily.   simvastatin 10 MG tablet Commonly known as:   ZOCOR Take 10 mg by mouth daily.   TAB-A-VITE Tabs Take 1 tablet by mouth daily.       No orders of the defined types were placed in this encounter.   Immunization History  Administered Date(s) Administered  . Influenza,inj,Quad PF,6+ Mos 07/05/2015  . Influenza-Unspecified 07/17/2016, 07/30/2017  . PPD Test 11/09/2015  . Pneumococcal Polysaccharide-23 07/05/2015  . Tdap 03/31/2015    Social History   Tobacco Use  . Smoking status: Former Smoker    Types: Cigars  . Smokeless tobacco: Never Used  Substance Use Topics  . Alcohol use: No    Alcohol/week: 0.0 oz    Review of Systems  labile to obtain secondary to mental status; nursing-no change, patient eating very little drinking very little     Vitals:   12/26/17 1612  BP: 118/78  Pulse: 70  Resp: 18  Temp: (!) 97.3 F (36.3 C)  SpO2: 95%   There is no height or weight on file to calculate BMI. Physical Exam  GENERAL APPEARANCE: Mildly somnolent No acute distress  SKIN: No diaphoresis rash HEENT: Unremarkable RESPIRATORY: Breathing is even, unlabored. Lung sounds are clear   CARDIOVASCULAR: Heart RRR no murmurs, rubs or gallops. No peripheral edema  GASTROINTESTINAL: Abdomen is soft, non-tender, not distended w/ normal bowel sounds.  GENITOURINARY: Bladder non tender, not distended; suprapubic catheter placed  MUSCULOSKELETAL: No abnormal joints or musculature NEUROLOGIC: Cranial nerves 2-12 grossly intact. Moves all extremities PSYCHIATRIC: Somnolent, no behavioral issues  Patient Active Problem List   Diagnosis Date Noted  . BPH (benign prostatic hyperplasia) 08/29/2017  . Hyperlipidemia 05/01/2017  . Abnormality on screening test 12/26/2016  . Current use of long term anticoagulation - Eliquis for A fib 12/16/2016  . Depression 08/22/2016  . Vitamin D deficiency 07/11/2016  . Encounter for family conference with patient present 12/14/2015  . Frequent falls 11/05/2015  . FTT (failure to thrive)  in adult 09/25/2015  . Neurogenic bladder 09/25/2015  . DNR (do not resuscitate) 08/06/2015  . Catheter-associated urinary tract infection (HCC) 08/03/2015  . Myasthenia gravis (HCC) 07/13/2015  . Dementia 07/04/2015  . Pressure ulcer 07/04/2015  . Dizziness and giddiness 05/29/2015  . Weakness 05/29/2015  . Proximal limb muscle weakness 05/29/2015  . Ataxia 05/29/2015  . CKD (chronic kidney disease), stage III (HCC) 03/31/2015  . HTN (hypertension) 03/31/2015  . AF (atrial fibrillation) (HCC) 03/31/2015  . Dyslipidemia 03/31/2015    CMP     Component Value Date/Time   NA 141 09/21/2017   K 4.4 09/21/2017   CL 113 (H) 11/09/2015 0710   CO2 24 11/09/2015 0710   GLUCOSE 106 (H) 11/09/2015 0710   BUN 11 09/21/2017   CREATININE 0.8 09/21/2017   CREATININE 0.76  11/09/2015 0710   CALCIUM 8.4 (L) 11/09/2015 0710   PROT 5.1 (L) 11/06/2015 0330   PROT 7.7 05/29/2015 1319   ALBUMIN 2.5 (L) 11/06/2015 0330   ALBUMIN 4.0 05/29/2015 1319   AST 14 09/21/2017   ALT 16 09/21/2017   ALKPHOS 96 09/21/2017   BILITOT 0.8 11/06/2015 0330   BILITOT 1.2 05/29/2015 1319   GFRNONAA >60 11/09/2015 0710   GFRAA >60 11/09/2015 0710   Recent Labs    02/15/17 09/21/17  NA 145 141  K 4.3 4.4  BUN 16 11  CREATININE 0.9 0.8   Recent Labs    02/15/17 09/21/17  AST 23 14  ALT 26 16  ALKPHOS 76 96   Recent Labs    02/15/17 05/06/17 09/21/17  WBC 9.2 11.9 9.3  HGB 14.3 14.5 13.8  HCT 42 42 41  PLT 384 484* 435*   Recent Labs    02/15/17 09/21/17  CHOL 143 145  LDLCALC 77 72  TRIG 113 146   No results found for: MICROALBUR Lab Results  Component Value Date   TSH 6.30 (A) 09/21/2017   Lab Results  Component Value Date   HGBA1C 6.3 09/21/2017   Lab Results  Component Value Date   CHOL 145 09/21/2017   HDL 44 09/21/2017   LDLCALC 72 09/21/2017   TRIG 146 09/21/2017   CHOLHDL 9.3 01/20/2015    Significant Diagnostic Results in last 30 days:  No results found.  Assessment  and Plan  Neurogenic bladder/hospice status-spoke with Dr. Ferd Hibbs, patient's urologist, to discuss whether it's possible for patient to use a Foley catheter or if he absolutely needs a suprapubic catheter. Per Dr. Ferd Hibbs patient could use a Foley catheter, however it have to be a coud catheter 18 [place] secondary to BPH. Patient had a suprapubic catheter placed probably as this conversation was taking place; it usually lasts for a month, it is unlikely patient will need another catheter unless suprapubic malfunctions    Merrilee Seashore, MD

## 2018-01-17 NOTE — Progress Notes (Signed)
[facility]   [facility] of Service:   skilled [facility] facility  Margit HanksAlexander, Anne D, MD  Patient Care Team: Margit HanksAlexander, Anne D, MD as PCP - General (Internal Medicine) McKenzie, Mardene CelestePatrick L, MD as Consulting Physician (Urology)  Extended Emergency Contact Information Primary Emergency Contact: Strupp,David & Neoma LamingEllen  [place] Home Phone: [phone] Mobile Phone: [phone] Relation: Son Secondary Emergency Contact: Richardean ChimeraSmoak,Mark  [place] Home Phone: [phone] Relation: None    Allergies: Ceftin [cefuroxime]  Chief Complaint  Patient presents with  . Acute Visit    HPI: Patient is 84 y.o. male who is being seen acutely for a change in behavior. Patient has been failing over the past month more sharply over the past week and nurses reported that he is yelling out at night. They report that he yells out when he has any discomfort and also yells out and says he hurts all over. Patient denies pain at this time.  Past Medical History:  Diagnosis Date  . A-fib (HCC)   . AF (atrial fibrillation) (HCC) 03/31/2015  . Anorexia   . Aortic stenosis   . Arthritis   . Ataxia 05/29/2015  . Atrial fibrillation (HCC)   . Bacteremia due to Klebsiella pneumoniae 08/07/2015  . Candida UTI 10/01/2015  . Cerebral brain hemorrhage (HCC) 05/29/2015  . Chronic kidney disease   . CKD (chronic kidney disease), stage III (HCC) 03/31/2015  . Dementia    short term memory loss  . Depression 08/22/2016  . Encephalopathy acute   . Frequent falls 11/05/2015  . Gangrene (HCC)    perineal  . High cholesterol   . History of TIAs   . Hyperlipidemia   . Hypertension   . Major depressive disorder   . Multiple falls   . Myasthenia gravis (HCC)   . Neurogenic bladder 09/25/2015  . Sepsis due to Klebsiella pneumoniae (HCC) 08/07/2015  . Urinary incontinence   . Vertigo   . Vitamin D deficiency   . Weakness 05/29/2015    Past Surgical History:  Procedure  Laterality Date  . BACK SURGERY  2006  . CERVICAL LAMINECTOMY  2002 ?  [name]. CYSTOSCOPY N/A 06/26/2015   Procedure: CYSTOSCOPY;  Surgeon: Malen GauzePatrick L McKenzie, MD;  Location: [facility];  Service: Urology;  Laterality: N/A;  . HERNIA REPAIR     1970s  . INSERTION OF SUPRAPUBIC CATHETER N/A 06/26/2015   Procedure: INSERTION OF SUPRAPUBIC CATHETER;  Surgeon: Malen GauzePatrick L McKenzie, MD;  Location: [facility];  Service: Urology;  Laterality: N/A;    Allergies as of 12/20/2017      Reactions   Ceftin [cefuroxime] Diarrhea   Severe diarrhea      Medication List        Accurate as of 12/20/17 11:59 PM. Always use your most recent med list.          acetaminophen 500 MG tablet Commonly known as:  TYLENOL Take 500 mg by mouth every 6 (six) hours as needed for mild pain.   cholecalciferol 1000 units tablet Commonly known as:  VITAMIN D Take 1,000 Units by mouth daily.   diclofenac sodium 1 % Gel Commonly known as:  VOLTAREN Apply 4 g topically 3 (three) times daily.   docusate sodium 100 MG capsule Commonly known as:  COLACE Take 100 mg by mouth 2 (two) times daily.   ELIQUIS 2.5 MG Tabs tablet Generic drug:  apixaban Take 2.5 mg by mouth 2 (two) times daily.   eucerin cream Apply  topically to bilateral legs daily for dry skin   metoprolol succinate 50 MG 24 hr tablet Commonly known as:  TOPROL-XL Take 50 mg by mouth every morning. Take with or immediately following a meal.   MYRBETRIQ 50 MG Tb24 tablet Generic drug:  mirabegron ER Take 50 mg by mouth daily.   NUTRITIONAL SUPPLEMENT PO Offer Magic Cup as bedtime snack related to weight loss   ENSURE Take 237 mLs by mouth 2 (two) times daily between meals.   pyridostigmine 60 MG tablet Commonly known as:  MESTINON Take 60 mg by mouth 3 (three) times daily.   senna 8.6 MG tablet Commonly known as:  SENOKOT Take 1 tablet by mouth 2 (two) times daily.   simvastatin 10 MG tablet Commonly known as:  ZOCOR Take 10 mg by mouth daily.     TAB-A-VITE Tabs Take 1 tablet by mouth daily.       No orders of the defined types were placed in this encounter.   Immunization History  Administered Date(s) Administered  . Influenza,inj,Quad PF,6+ Mos 07/05/2015  . Influenza-Unspecified 07/17/2016, 07/30/2017  . PPD Test 11/09/2015  . Pneumococcal Polysaccharide-23 07/05/2015  . Tdap 03/31/2015    Social History   Tobacco Use  . Smoking status: Former Smoker    Types: Cigars  . Smokeless tobacco: Never Used  Substance Use Topics  . Alcohol use: No    Alcohol/week: 0.0 oz    Review of Systems  DATA OBTAINED: from patient-limited; nursing-as per history of present illness GENERAL:  no fevers, fatigue,+ appetite changes SKIN: No itching, rash HEENT: No complaint RESPIRATORY: No cough, wheezing, SOB CARDIAC: No chest pain, palpitations, lower extremity edema  GI: No abdominal pain, No N/V/D or constipation, No heartburn or reflux  GU: No dysuria, frequency or urgency, or incontinence  MUSCULOSKELETAL: No unrelieved bone/joint pain NEUROLOGIC: No headache, dizziness  PSYCHIATRIC: Failing to thrive  Vitals:   01-16-2018 2106  BP: 118/78  Pulse: 70  Resp: 18  Temp: (!) 97.3 F (36.3 C)  SpO2: 95%   There is no height or weight on file to calculate BMI. Physical Exam  GENERAL APPEARANCE: Alert, minimally conversant, No acute distress  SKIN: No diaphoresis rash HEENT: Unremarkable RESPIRATORY: Breathing is even, unlabored. Lung sounds are clear   CARDIOVASCULAR: Heart RRR no murmurs, rubs or gallops. No peripheral edema  GASTROINTESTINAL: Abdomen is soft, non-tender, not distended w/ normal bowel sounds.  GENITOURINARY: Bladder non tender, not distended  MUSCULOSKELETAL: No abnormal joints or musculature NEUROLOGIC: Cranial nerves 2-12 grossly intact. Moves all extremities PSYCHIATRIC: Mood and affect flat, no behavioral issues  Patient Active Problem List   Diagnosis Date Noted  . BPH (benign prostatic  hyperplasia) 08/29/2017  . Hyperlipidemia 05/01/2017  . Abnormality on screening test 12/26/2016  . Current use of long term anticoagulation - Eliquis for A fib 12/16/2016  . Depression 08/22/2016  . Vitamin D deficiency 07/11/2016  . Encounter for family conference with patient present 12/14/2015  . Frequent falls 11/05/2015  . FTT (failure to thrive) in adult 09/25/2015  . Neurogenic bladder 09/25/2015  . DNR (do not resuscitate) 08/06/2015  . Catheter-associated urinary tract infection (HCC) 08/03/2015  . Myasthenia gravis (HCC) 07/13/2015  . Dementia 07/04/2015  . Pressure ulcer 07/04/2015  . Dizziness and giddiness 05/29/2015  . Weakness 05/29/2015  . Proximal limb muscle weakness 05/29/2015  . Ataxia 05/29/2015  . CKD (chronic kidney disease), stage III (HCC) 03/31/2015  . HTN (hypertension) 03/31/2015  . AF (atrial fibrillation) (HCC) 03/31/2015  .  Dyslipidemia 03/31/2015    CMP     Component Value Date/Time   NA 141 09/21/2017   K 4.4 09/21/2017   CL 113 (H) 11/09/2015 0710   CO2 24 11/09/2015 0710   GLUCOSE 106 (H) 11/09/2015 0710   BUN 11 09/21/2017   CREATININE 0.8 09/21/2017   CREATININE 0.76 11/09/2015 0710   CALCIUM 8.4 (L) 11/09/2015 0710   PROT 5.1 (L) 11/06/2015 0330   PROT 7.7 05/29/2015 1319   ALBUMIN 2.5 (L) 11/06/2015 0330   ALBUMIN 4.0 05/29/2015 1319   AST 14 09/21/2017   ALT 16 09/21/2017   ALKPHOS 96 09/21/2017   BILITOT 0.8 11/06/2015 0330   BILITOT 1.2 05/29/2015 1319   GFRNONAA >60 11/09/2015 0710   GFRAA >60 11/09/2015 0710   Recent Labs    02/15/17 09/21/17  NA 145 141  K 4.3 4.4  BUN 16 11  CREATININE 0.9 0.8   Recent Labs    02/15/17 09/21/17  AST 23 14  ALT 26 16  ALKPHOS 76 96   Recent Labs    02/15/17 05/06/17 09/21/17  WBC 9.2 11.9 9.3  HGB 14.3 14.5 13.8  HCT 42 42 41  PLT 384 484* 435*   Recent Labs    02/15/17 09/21/17  CHOL 143 145  LDLCALC 77 72  TRIG 113 146   No results found for: MICROALBUR Lab  Results  Component Value Date   TSH 6.30 (A) 09/21/2017   Lab Results  Component Value Date   HGBA1C 6.3 09/21/2017   Lab Results  Component Value Date   CHOL 145 09/21/2017   HDL 44 09/21/2017   LDLCALC 72 09/21/2017   TRIG 146 09/21/2017   CHOLHDL 9.3 01/20/2015    Significant Diagnostic Results in last 30 days:  No results found.  Assessment and Plan  Agitation-hurting all over and is a psychological symptom; have written for Seroquel 25 mg daily at bedtime; monitor response   Merrilee Seashore, MD

## 2018-01-17 NOTE — Progress Notes (Signed)
Location:  [facility]   Place of Service:  SNF (31)  Margit Hanks, MD  Patient Care Team: Margit Hanks, MD as PCP - General (Internal Medicine) McKenzie, Mardene Celeste, MD as Consulting Physician (Urology)  Extended Emergency Contact Information Primary Emergency Contact: Vreeland,David & Neoma Laming [place] Home Phone: [phone] Mobile Phone: [phone] Relation: Son Secondary Emergency Contact: Richardean Chimera [place] Home Phone: [phone] Relation: None    Allergies: Ceftin [cefuroxime]  Chief Complaint  Patient presents with  . Acute Visit    HPI: Patient is 84 y.o. male who is being seen today because his family has decided to with palliative care. Patient has been in a rapid decline for the past week. Patient has had a very poor appetite for eating and drinking. He has had no symptoms to suggest that he has an infection even though his suprapubic catheter has stopped working today.  Past Medical History:  Diagnosis Date  . A-fib (HCC)   . AF (atrial fibrillation) (HCC) 03/31/2015  . Anorexia   . Aortic stenosis   . Arthritis   . Ataxia 05/29/2015  . Atrial fibrillation (HCC)   . Bacteremia due to Klebsiella pneumoniae 08/07/2015  . Candida UTI 10/01/2015  . Cerebral brain hemorrhage (HCC) 05/29/2015  . Chronic kidney disease   . CKD (chronic kidney disease), stage III (HCC) 03/31/2015  . Dementia    short term memory loss  . Depression 08/22/2016  . Encephalopathy acute   . Frequent falls 11/05/2015  . Gangrene (HCC)    perineal  . High cholesterol   . History of TIAs   . Hyperlipidemia   . Hypertension   . Major depressive disorder   . Multiple falls   . Myasthenia gravis (HCC)   . Neurogenic bladder 09/25/2015  . Sepsis due to Klebsiella pneumoniae (HCC) 08/07/2015  . Urinary incontinence   . Vertigo   . Vitamin D deficiency   . Weakness 05/29/2015    Past Surgical History:  Procedure  Laterality Date  . BACK SURGERY  2006  . CERVICAL LAMINECTOMY  2002 ?  [name] CYSTOSCOPY N/A 06/26/2015   Procedure: CYSTOSCOPY;  Surgeon: Malen Gauze, MD;  Location: [facility];  Service: Urology;  Laterality: N/A;  . HERNIA REPAIR     1970s  . INSERTION OF SUPRAPUBIC CATHETER N/A 06/26/2015   Procedure: INSERTION OF SUPRAPUBIC CATHETER;  Surgeon: Malen Gauze, MD;  Location: [facility];  Service: Urology;  Laterality: N/A;    Allergies as of 12/21/2017      Reactions   Ceftin [cefuroxime] Diarrhea   Severe diarrhea      Medication List        Accurate as of 12/21/17 11:59 PM. Always use your most recent med list.          acetaminophen 500 MG tablet Commonly known as:  TYLENOL Take 500 mg by mouth every 6 (six) hours as needed for mild pain.   cholecalciferol 1000 units tablet Commonly known as:  VITAMIN D Take 1,000 Units by mouth daily.   diclofenac sodium 1 % Gel Commonly known as:  VOLTAREN Apply 4 g topically 3 (three) times daily.   docusate sodium 100 MG capsule Commonly known as:  COLACE Take 100 mg by mouth 2 (two) times daily.   ELIQUIS 2.5 MG Tabs tablet Generic drug:  apixaban Take 2.5 mg by mouth 2 (two) times daily.   eucerin cream Apply topically to bilateral legs  daily for dry skin   metoprolol succinate 50 MG 24 hr tablet Commonly known as:  TOPROL-XL Take 50 mg by mouth every morning. Take with or immediately following a meal.   MYRBETRIQ 50 MG Tb24 tablet Generic drug:  mirabegron ER Take 50 mg by mouth daily.   NUTRITIONAL SUPPLEMENT PO Offer Magic Cup as bedtime snack related to weight loss   ENSURE Take 237 mLs by mouth 2 (two) times daily between meals.   pyridostigmine 60 MG tablet Commonly known as:  MESTINON Take 60 mg by mouth 3 (three) times daily.   senna 8.6 MG tablet Commonly known as:  SENOKOT Take 1 tablet by mouth 2 (two) times daily.   simvastatin 10 MG tablet Commonly known as:  ZOCOR Take 10 mg by mouth daily.     TAB-A-VITE Tabs Take 1 tablet by mouth daily.       No orders of the defined types were placed in this encounter.   Immunization History  Administered Date(s) Administered  . Influenza,inj,Quad PF,6+ Mos 07/05/2015  . Influenza-Unspecified 07/17/2016, 07/30/2017  . PPD Test 11/09/2015  . Pneumococcal Polysaccharide-23 07/05/2015  . Tdap 03/31/2015    Social History   Tobacco Use  . Smoking status: Former Smoker    Types: Cigars  . Smokeless tobacco: Never Used  Substance Use Topics  . Alcohol use: No    Alcohol/week: 0.0 oz    Review of Systems  DATA OBTAINED: from patient-limited; nursing-concern for poor by mouth intake, concern for nonfunctioning suprapubic catheter GENERAL:  no fevers, fatigue, appetite changes SKIN: No itching, rash HEENT: No complaint RESPIRATORY: No cough, wheezing, SOB CARDIAC: No chest pain, palpitations, lower extremity edema  GI: No abdominal pain, No N/V/D or constipation, No heartburn or reflux  GU: No dysuria, frequency or urgency, or incontinence  MUSCULOSKELETAL: No unrelieved bone/joint pain NEUROLOGIC: No headache, dizziness  PSYCHIATRIC: Failing to thrive  Vitals:   2018/01/15 1844  BP: 118/78  Pulse: 70  Resp: 18  Temp: (!) 97.3 F (36.3 C)  SpO2: 95%   There is no height or weight on file to calculate BMI. Physical Exam  GENERAL APPEARANCE: Quiet, No acute distress  SKIN: No diaphoresis rash HEENT: Unremarkable RESPIRATORY: Breathing is even, unlabored. Lung sounds are clear   CARDIOVASCULAR: Heart RRR no murmurs, rubs or gallops. No peripheral edema  GASTROINTESTINAL: Abdomen is soft, non-tender, not distended w/ normal bowel sounds.  GENITOURINARY: Bladder non tender, not distended  MUSCULOSKELETAL: No abnormal joints or musculature NEUROLOGIC: Cranial nerves 2-12 grossly intact. Moves all extremities PSYCHIATRIC: Mood and affect flat, no behavioral issues  Patient Active Problem List   Diagnosis Date Noted  .  BPH (benign prostatic hyperplasia) 08/29/2017  . Hyperlipidemia 05/01/2017  . Abnormality on screening test 12/26/2016  . Current use of long term anticoagulation - Eliquis for A fib 12/16/2016  . Depression 08/22/2016  . Vitamin D deficiency 07/11/2016  . Encounter for family conference with patient present 12/14/2015  . Frequent falls 11/05/2015  . FTT (failure to thrive) in adult 09/25/2015  . Neurogenic bladder 09/25/2015  . DNR (do not resuscitate) 08/06/2015  . Catheter-associated urinary tract infection (HCC) 08/03/2015  . Myasthenia gravis (HCC) 07/13/2015  . Dementia 07/04/2015  . Pressure ulcer 07/04/2015  . Dizziness and giddiness 05/29/2015  . Weakness 05/29/2015  . Proximal limb muscle weakness 05/29/2015  . Ataxia 05/29/2015  . CKD (chronic kidney disease), stage III (HCC) 03/31/2015  . HTN (hypertension) 03/31/2015  . AF (atrial fibrillation) (HCC) 03/31/2015  .  Dyslipidemia 03/31/2015    CMP     Component Value Date/Time   NA 141 09/21/2017   K 4.4 09/21/2017   CL 113 (H) 11/09/2015 0710   CO2 24 11/09/2015 0710   GLUCOSE 106 (H) 11/09/2015 0710   BUN 11 09/21/2017   CREATININE 0.8 09/21/2017   CREATININE 0.76 11/09/2015 0710   CALCIUM 8.4 (L) 11/09/2015 0710   PROT 5.1 (L) 11/06/2015 0330   PROT 7.7 05/29/2015 1319   ALBUMIN 2.5 (L) 11/06/2015 0330   ALBUMIN 4.0 05/29/2015 1319   AST 14 09/21/2017   ALT 16 09/21/2017   ALKPHOS 96 09/21/2017   BILITOT 0.8 11/06/2015 0330   BILITOT 1.2 05/29/2015 1319   GFRNONAA >60 11/09/2015 0710   GFRAA >60 11/09/2015 0710   Recent Labs    02/15/17 09/21/17  NA 145 141  K 4.3 4.4  BUN 16 11  CREATININE 0.9 0.8   Recent Labs    02/15/17 09/21/17  AST 23 14  ALT 26 16  ALKPHOS 76 96   Recent Labs    02/15/17 05/06/17 09/21/17  WBC 9.2 11.9 9.3  HGB 14.3 14.5 13.8  HCT 42 42 41  PLT 384 484* 435*   Recent Labs    02/15/17 09/21/17  CHOL 143 145  LDLCALC 77 72  TRIG 113 146   No results found  for: MICROALBUR Lab Results  Component Value Date   TSH 6.30 (A) 09/21/2017   Lab Results  Component Value Date   HGBA1C 6.3 09/21/2017   Lab Results  Component Value Date   CHOL 145 09/21/2017   HDL 44 09/21/2017   LDLCALC 72 09/21/2017   TRIG 146 09/21/2017   CHOLHDL 9.3 01/20/2015    Significant Diagnostic Results in last 30 days:  No results found.  Assessment and Plan  Failure to thrive/suprapubic catheter problem-family has spoken to about to palliative care and palliative care is recommending hospice care. Hospice care and I have been discussing. There is one problem, which is the family was okay with patient using a diaper; however because patient has neurogenic bladder this is not something that can be left on its own. I called patient's son who is POA and he was not available, but I spoke with his wife. She understood and is okay with patient going to urology tomorrow to get a suprapubic catheter. I had already called [facility] to speak with patient's urologist but both physicians available are in surgery. I will plan to speak with Dr. Thea SilversmithMackenzie tomorrow.   Time spent greater than 35 minutes;> 50% of time with patient was spent reviewing records, labs, tests and studies, counseling and developing plan of care  Merrilee SeashoreAnne Rozina Pointer, MD

## 2018-01-17 DEATH — deceased
